# Patient Record
Sex: Female | Born: 1960 | Race: White | Hispanic: No | Smoking: Never smoker
Health system: Southern US, Community
[De-identification: ages and names within clinical notes are randomized; demographics above are authoritative.]

## PROBLEM LIST (undated history)

## (undated) DIAGNOSIS — E039 Hypothyroidism, unspecified: Secondary | ICD-10-CM

## (undated) DIAGNOSIS — Z794 Long term (current) use of insulin: Secondary | ICD-10-CM

## (undated) DIAGNOSIS — K219 Gastro-esophageal reflux disease without esophagitis: Secondary | ICD-10-CM

## (undated) DIAGNOSIS — Z9889 Other specified postprocedural states: Secondary | ICD-10-CM

## (undated) DIAGNOSIS — T8859XA Other complications of anesthesia, initial encounter: Secondary | ICD-10-CM

## (undated) DIAGNOSIS — N289 Disorder of kidney and ureter, unspecified: Secondary | ICD-10-CM

## (undated) DIAGNOSIS — M25519 Pain in unspecified shoulder: Secondary | ICD-10-CM

## (undated) DIAGNOSIS — Z87442 Personal history of urinary calculi: Secondary | ICD-10-CM

## (undated) DIAGNOSIS — G43909 Migraine, unspecified, not intractable, without status migrainosus: Secondary | ICD-10-CM

## (undated) DIAGNOSIS — E282 Polycystic ovarian syndrome: Secondary | ICD-10-CM

## (undated) DIAGNOSIS — I499 Cardiac arrhythmia, unspecified: Secondary | ICD-10-CM

## (undated) DIAGNOSIS — N898 Other specified noninflammatory disorders of vagina: Secondary | ICD-10-CM

## (undated) DIAGNOSIS — T4145XA Adverse effect of unspecified anesthetic, initial encounter: Secondary | ICD-10-CM

## (undated) DIAGNOSIS — IMO0001 Reserved for inherently not codable concepts without codable children: Secondary | ICD-10-CM

## (undated) DIAGNOSIS — R112 Nausea with vomiting, unspecified: Secondary | ICD-10-CM

## (undated) DIAGNOSIS — I10 Essential (primary) hypertension: Secondary | ICD-10-CM

## (undated) DIAGNOSIS — E119 Type 2 diabetes mellitus without complications: Secondary | ICD-10-CM

## (undated) DIAGNOSIS — E079 Disorder of thyroid, unspecified: Secondary | ICD-10-CM

## (undated) DIAGNOSIS — M1990 Unspecified osteoarthritis, unspecified site: Secondary | ICD-10-CM

## (undated) DIAGNOSIS — M7062 Trochanteric bursitis, left hip: Secondary | ICD-10-CM

## (undated) HISTORY — DX: Other specified noninflammatory disorders of vagina: N89.8

## (undated) HISTORY — DX: Reserved for inherently not codable concepts without codable children: IMO0001

## (undated) HISTORY — DX: Migraine, unspecified, not intractable, without status migrainosus: G43.909

## (undated) HISTORY — DX: Polycystic ovarian syndrome: E28.2

## (undated) HISTORY — PX: CARDIAC CATHETERIZATION: SHX172

## (undated) HISTORY — DX: Trochanteric bursitis, left hip: M70.62

## (undated) HISTORY — DX: Pain in unspecified shoulder: M25.519

## (undated) HISTORY — DX: Essential (primary) hypertension: I10

## (undated) HISTORY — DX: Long term (current) use of insulin: Z79.4

## (undated) HISTORY — DX: Disorder of thyroid, unspecified: E07.9

## (undated) HISTORY — DX: Type 2 diabetes mellitus without complications: E11.9

---
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
## 1984-01-29 DIAGNOSIS — E282 Polycystic ovarian syndrome: Secondary | ICD-10-CM

## 1984-01-29 HISTORY — DX: Polycystic ovarian syndrome: E28.2

## 1995-01-29 HISTORY — PX: OTHER SURGICAL HISTORY: SHX169

## 1997-06-02 ENCOUNTER — Other Ambulatory Visit: Admission: RE | Admit: 1997-06-02 | Discharge: 1997-06-02 | Payer: Self-pay | Admitting: Obstetrics and Gynecology

## 1997-07-13 ENCOUNTER — Other Ambulatory Visit: Admission: RE | Admit: 1997-07-13 | Discharge: 1997-07-13 | Payer: Self-pay | Admitting: Obstetrics and Gynecology

## 1998-08-30 ENCOUNTER — Inpatient Hospital Stay (HOSPITAL_COMMUNITY): Admission: AD | Admit: 1998-08-30 | Discharge: 1998-08-30 | Payer: Self-pay | Admitting: Obstetrics and Gynecology

## 1998-09-04 ENCOUNTER — Encounter: Admission: RE | Admit: 1998-09-04 | Discharge: 1998-12-03 | Payer: Self-pay | Admitting: Obstetrics and Gynecology

## 1998-11-06 ENCOUNTER — Inpatient Hospital Stay (HOSPITAL_COMMUNITY): Admission: AD | Admit: 1998-11-06 | Discharge: 1998-11-06 | Payer: Self-pay | Admitting: *Deleted

## 1999-01-29 HISTORY — PX: OTHER SURGICAL HISTORY: SHX169

## 1999-02-09 ENCOUNTER — Inpatient Hospital Stay (HOSPITAL_COMMUNITY): Admission: AD | Admit: 1999-02-09 | Discharge: 1999-02-09 | Payer: Self-pay | Admitting: Obstetrics & Gynecology

## 1999-04-10 ENCOUNTER — Encounter (INDEPENDENT_AMBULATORY_CARE_PROVIDER_SITE_OTHER): Payer: Self-pay

## 1999-04-10 ENCOUNTER — Inpatient Hospital Stay (HOSPITAL_COMMUNITY): Admission: AD | Admit: 1999-04-10 | Discharge: 1999-04-13 | Payer: Self-pay | Admitting: Obstetrics and Gynecology

## 1999-04-14 ENCOUNTER — Encounter: Admission: RE | Admit: 1999-04-14 | Discharge: 1999-07-13 | Payer: Self-pay | Admitting: Obstetrics and Gynecology

## 1999-09-04 ENCOUNTER — Other Ambulatory Visit: Admission: RE | Admit: 1999-09-04 | Discharge: 1999-09-04 | Payer: Self-pay | Admitting: Obstetrics and Gynecology

## 2000-03-11 ENCOUNTER — Other Ambulatory Visit: Admission: RE | Admit: 2000-03-11 | Discharge: 2000-03-11 | Payer: Self-pay | Admitting: Obstetrics and Gynecology

## 2000-12-02 ENCOUNTER — Other Ambulatory Visit: Admission: RE | Admit: 2000-12-02 | Discharge: 2000-12-02 | Payer: Self-pay | Admitting: Obstetrics and Gynecology

## 2001-07-17 ENCOUNTER — Encounter: Payer: Self-pay | Admitting: Orthopedic Surgery

## 2001-07-17 ENCOUNTER — Encounter: Admission: RE | Admit: 2001-07-17 | Discharge: 2001-07-17 | Payer: Self-pay | Admitting: Orthopedic Surgery

## 2002-06-25 ENCOUNTER — Ambulatory Visit (HOSPITAL_COMMUNITY): Admission: RE | Admit: 2002-06-25 | Discharge: 2002-06-25 | Payer: Self-pay | Admitting: Obstetrics and Gynecology

## 2002-06-25 ENCOUNTER — Encounter: Payer: Self-pay | Admitting: Obstetrics and Gynecology

## 2002-06-29 ENCOUNTER — Other Ambulatory Visit: Admission: RE | Admit: 2002-06-29 | Discharge: 2002-06-29 | Payer: Self-pay | Admitting: Obstetrics and Gynecology

## 2003-06-28 ENCOUNTER — Other Ambulatory Visit: Admission: RE | Admit: 2003-06-28 | Discharge: 2003-06-28 | Payer: Self-pay | Admitting: Obstetrics and Gynecology

## 2003-06-30 ENCOUNTER — Ambulatory Visit (HOSPITAL_COMMUNITY): Admission: RE | Admit: 2003-06-30 | Discharge: 2003-06-30 | Payer: Self-pay | Admitting: Obstetrics and Gynecology

## 2003-08-01 ENCOUNTER — Emergency Department (HOSPITAL_COMMUNITY): Admission: EM | Admit: 2003-08-01 | Discharge: 2003-08-01 | Payer: Self-pay | Admitting: Emergency Medicine

## 2005-04-11 ENCOUNTER — Encounter: Admission: RE | Admit: 2005-04-11 | Discharge: 2005-06-20 | Payer: Self-pay | Admitting: Orthopedic Surgery

## 2005-07-22 ENCOUNTER — Ambulatory Visit (HOSPITAL_COMMUNITY): Admission: RE | Admit: 2005-07-22 | Discharge: 2005-07-22 | Payer: Self-pay | Admitting: Obstetrics and Gynecology

## 2005-09-04 ENCOUNTER — Other Ambulatory Visit: Admission: RE | Admit: 2005-09-04 | Discharge: 2005-09-04 | Payer: Self-pay | Admitting: Obstetrics and Gynecology

## 2006-06-20 ENCOUNTER — Ambulatory Visit: Payer: Self-pay | Admitting: Cardiology

## 2009-06-13 ENCOUNTER — Emergency Department (HOSPITAL_COMMUNITY): Admission: EM | Admit: 2009-06-13 | Discharge: 2009-06-13 | Payer: Self-pay | Admitting: Emergency Medicine

## 2009-11-08 ENCOUNTER — Emergency Department (HOSPITAL_COMMUNITY): Admission: EM | Admit: 2009-11-08 | Discharge: 2009-11-08 | Payer: Self-pay | Admitting: Emergency Medicine

## 2009-11-15 ENCOUNTER — Emergency Department (HOSPITAL_COMMUNITY): Admission: EM | Admit: 2009-11-15 | Discharge: 2009-11-15 | Payer: Self-pay | Admitting: Emergency Medicine

## 2010-02-18 ENCOUNTER — Encounter: Payer: Self-pay | Admitting: Family Medicine

## 2010-04-12 LAB — COMPREHENSIVE METABOLIC PANEL
ALT: 35 U/L (ref 0–35)
Albumin: 3.5 g/dL (ref 3.5–5.2)
CO2: 26 mEq/L (ref 19–32)
Chloride: 107 mEq/L (ref 96–112)
Glucose, Bld: 94 mg/dL (ref 70–99)
Potassium: 3.6 mEq/L (ref 3.5–5.1)
Sodium: 140 mEq/L (ref 135–145)
Total Protein: 5.8 g/dL — ABNORMAL LOW (ref 6.0–8.3)

## 2010-04-12 LAB — URINALYSIS, ROUTINE W REFLEX MICROSCOPIC
Glucose, UA: NEGATIVE mg/dL
Hgb urine dipstick: NEGATIVE
Ketones, ur: NEGATIVE mg/dL
Nitrite: NEGATIVE
Protein, ur: NEGATIVE mg/dL
Urobilinogen, UA: 1 mg/dL (ref 0.0–1.0)
pH: 8.5 — ABNORMAL HIGH (ref 5.0–8.0)

## 2010-04-12 LAB — CBC
Hemoglobin: 12.5 g/dL (ref 12.0–15.0)
MCH: 30 pg (ref 26.0–34.0)
MCHC: 34 g/dL (ref 30.0–36.0)
Platelets: 219 10*3/uL (ref 150–400)

## 2010-04-16 LAB — BASIC METABOLIC PANEL
CO2: 30 mEq/L (ref 19–32)
Calcium: 8.7 mg/dL (ref 8.4–10.5)
Chloride: 109 mEq/L (ref 96–112)
Creatinine, Ser: 0.57 mg/dL (ref 0.4–1.2)
GFR calc Af Amer: >60 mL/min (ref >60)
GFR calc non Af Amer: >60 mL/min (ref >60)
Potassium: 3.9 mEq/L (ref 3.5–5.1)

## 2010-04-16 LAB — DIFFERENTIAL
Basophils Absolute: 0.1 10*3/uL (ref 0.0–0.1)
Eosinophils Absolute: 0.3 10*3/uL (ref 0.0–0.7)
Eosinophils Relative: 3 % (ref 0–5)
Lymphocytes Relative: 32 % (ref 12–46)
Monocytes Absolute: 0.4 10*3/uL (ref 0.1–1.0)
Monocytes Relative: 5 % (ref 3–12)
Neutro Abs: 5.3 10*3/uL (ref 1.7–7.7)

## 2010-04-16 LAB — CBC: RBC: 4.07 MIL/uL (ref 3.87–5.11)

## 2010-05-31 ENCOUNTER — Encounter: Payer: Self-pay | Admitting: Family Medicine

## 2011-05-22 ENCOUNTER — Encounter: Payer: Self-pay | Admitting: Cardiology

## 2011-05-22 NOTE — Progress Notes (Signed)
Error

## 2011-10-04 ENCOUNTER — Encounter (HOSPITAL_COMMUNITY): Payer: Self-pay

## 2011-10-04 ENCOUNTER — Emergency Department (HOSPITAL_COMMUNITY)
Admission: EM | Admit: 2011-10-04 | Discharge: 2011-10-04 | Disposition: A | Discharge status: Home / Self Care | Payer: BC Managed Care – PPO | Attending: Emergency Medicine | Admitting: Emergency Medicine

## 2011-10-04 ENCOUNTER — Emergency Department (HOSPITAL_COMMUNITY): Payer: BC Managed Care – PPO

## 2011-10-04 DIAGNOSIS — N201 Calculus of ureter: Secondary | ICD-10-CM | POA: Insufficient documentation

## 2011-10-04 DIAGNOSIS — E119 Type 2 diabetes mellitus without complications: Secondary | ICD-10-CM | POA: Insufficient documentation

## 2011-10-04 DIAGNOSIS — Z79899 Other long term (current) drug therapy: Secondary | ICD-10-CM | POA: Insufficient documentation

## 2011-10-04 DIAGNOSIS — Z794 Long term (current) use of insulin: Secondary | ICD-10-CM | POA: Insufficient documentation

## 2011-10-04 DIAGNOSIS — Z9089 Acquired absence of other organs: Secondary | ICD-10-CM | POA: Insufficient documentation

## 2011-10-04 LAB — URINE MICROSCOPIC-ADD ON

## 2011-10-04 LAB — URINALYSIS, ROUTINE W REFLEX MICROSCOPIC
Glucose, UA: NEGATIVE mg/dL
Nitrite: NEGATIVE
Protein, ur: 100 mg/dL — AB
Urobilinogen, UA: 1 mg/dL (ref 0.0–1.0)
pH: 5.5 (ref 5.0–8.0)

## 2011-10-04 MED ORDER — ONDANSETRON HCL 4 MG/2ML IJ SOLN
4.0000 mg | Freq: Once | INTRAMUSCULAR | Status: AC
  Administered 2011-10-04: 4 mg via INTRAVENOUS
  Filled 2011-10-04: qty 2

## 2011-10-04 MED ORDER — OXYCODONE-ACETAMINOPHEN 5-325 MG PO TABS: 1.0000 | ORAL_TABLET | ORAL | Status: AC | PRN

## 2011-10-04 MED ORDER — HYDROMORPHONE HCL PF 1 MG/ML IJ SOLN
1.0000 mg | INTRAMUSCULAR | Status: AC
  Administered 2011-10-04: 1 mg via INTRAVENOUS
  Filled 2011-10-04: qty 1

## 2011-10-04 MED ORDER — KETOROLAC TROMETHAMINE 30 MG/ML IJ SOLN
30.0000 mg | Freq: Once | INTRAMUSCULAR | Status: AC
  Administered 2011-10-04: 30 mg via INTRAVENOUS
  Filled 2011-10-04: qty 1

## 2011-10-04 MED ORDER — MORPHINE SULFATE 4 MG/ML IJ SOLN
6.0000 mg | Freq: Once | INTRAMUSCULAR | Status: AC
  Administered 2011-10-04: 6 mg via INTRAVENOUS
  Filled 2011-10-04: qty 2

## 2011-10-04 MED ORDER — PROMETHAZINE HCL 25 MG PO TABS: 25.0000 mg | ORAL_TABLET | Freq: Three times a day (TID) | ORAL | Status: DC | PRN

## 2011-10-04 NOTE — ED Notes (Signed)
Pt reports (R) flank pain, lower back pain starting 0430 this am, pain w/urination, and "red" colored urine x4 days. Pt reports hx of kidney stones, pt seen ather pcp yesterday

## 2011-10-04 NOTE — ED Notes (Signed)
To ct

## 2011-10-04 NOTE — ED Provider Notes (Signed)
History     CSN: [id number]  Arrival date & time 10/04/11  7829   First MD Initiated Contact with Patient 10/04/11 [phone]      Chief Complaint  Patient presents with  . Flank Pain    (Consider location/radiation/quality/duration/timing/severity/associated sxs/prior treatment) HPI Comments: Patient with hx kidney stones presents with right flank pain, hematuria, N/V, increased urinary output. Pain started at 4:30 this morning, pain described as a constant 10/10 stabbing pain in her lower right back. She states she can feel the "stone" moving. Patient has had cloudy, bloody urine x 4 days and and achy lower abdominal pain x 2 days, went to her PCP yesterday and was prescribed Vicodin and Cipro. Patient took a Vicodin this morning for the pain but vomited shortly after and states it did not help.  Patient has been nauseated since the pain started and reports vomiting twice, no blood in emesis. Patient reports more volume when she urinates and "possibly" more frequency of urination. Has had an increase in BMs for the past 3-4 days, having 3-4 BMs daily (normal is once daily), which she reports being similar to the last time she had a kidney stone. Patient reports h/o kidney stones, the last one 7 years ago and states this pain is the same. She denies fevers, dysuria, diarrhea, constipation, hematochezia.   Patient is a 51 y.o. female presenting with flank pain. The history is provided by the patient.  Flank Pain Associated symptoms include abdominal pain, nausea and vomiting. Pertinent negatives include no chills or fever.    Past Medical History  Diagnosis Date  . IDDM (insulin dependent diabetes mellitus)     AODM   . AOD abuse   . Migraines   . Trochanteric bursitis of left hip   . Shoulder pain     Past Surgical History  Procedure Date  . Appendectomy 1967  . Nsvd 2001  . Sonobysterogram, laporoscopy 1997    Family History  Problem Relation Age of Onset  . Hypertension Mother     . Hypertension Father   . Hypertension Sister   . Hypertension Maternal Grandmother   . Heart attack Maternal Grandfather   . Diabetes Maternal Grandfather   . Diabetes Paternal Grandfather   . Diabetes Mother     History  Substance Use Topics  . Smoking status: Never Smoker   . Smokeless tobacco: Not on file  . Alcohol Use: No    OB History    Grav Para Term Preterm Abortions TAB SAB Ect Mult Living                  Review of Systems  Constitutional: Negative for fever and chills.  Gastrointestinal: Positive for nausea, vomiting and abdominal pain. Negative for diarrhea, constipation and blood in stool.  Genitourinary: Positive for flank pain. Negative for dysuria, urgency, frequency and decreased urine volume.    Allergies  Simvastatin  Home Medications   Current Outpatient Rx  Name Route Sig Dispense Refill  . TYLENOL ARTHRITIS PAIN PO Oral Take by mouth as needed.      . ASPIRIN 81 MG PO CHEW Oral Chew 81 mg by mouth daily.      [name] ESCITALOPRAM OXALATE 20 MG PO TABS Oral Take 20 mg by mouth daily.      [name] NOVOLOG FLEXPEN [place] Subcutaneous Inject into the skin daily before lunch. 6-8 Units     . INSULIN DETEMIR 100 UNIT/ML [place] SOLN Subcutaneous Inject into the skin at bedtime. Sliding scale  40-45 Units daily       . METFORMIN HCL 500 MG PO TABS Oral Take 500 mg by mouth as directed. Take one tablet by mouth at 2 AM and again at 3 PM.     . MINOCYCLINE HCL 100 MG PO TABS Oral Take 100 mg by mouth daily.      [name] MOEXIPRIL-HYDROCHLOROTHIAZIDE 7.5-12.5 MG PO TABS Oral Take 1 tablet by mouth daily.      [name] PHENTERMINE HCL 37.5 MG PO CAPS Oral Take 37.5 mg by mouth every morning.      [name] ROSUVASTATIN CALCIUM 5 MG PO TABS Oral Take 5 mg by mouth daily.        BP 176/71  Pulse 82  Temp 97.4 F (36.3 C) (Oral)  Resp 18  SpO2 100%  Physical Exam  Nursing note and vitals reviewed. Constitutional: She appears well-developed and well-nourished. No distress.  HENT:  Head:  Normocephalic and atraumatic.  Neck: Neck supple.  Cardiovascular: Normal rate and regular rhythm.   Pulmonary/Chest: Effort normal and breath sounds normal. No respiratory distress. She has no wheezes. She has no rales.  Abdominal: Soft. She exhibits no distension. There is tenderness. There is CVA tenderness. There is no rebound and no guarding.         Right CVA tenderness  Neurological: She is alert.  Skin: She is not diaphoretic.    ED Course  Procedures (including critical care time)  Labs Reviewed  URINALYSIS, ROUTINE W REFLEX MICROSCOPIC - Abnormal; Notable for the following:    Color, Urine RED (*)  BIOCHEMICALS MAY BE AFFECTED BY COLOR   APPearance TURBID (*)     Specific Gravity, Urine 1.031 (*)     Hgb urine dipstick LARGE (*)     Bilirubin Urine MODERATE (*)     Ketones, ur 15 (*)     Protein, ur 100 (*)     Leukocytes, UA SMALL (*)     All other components within normal limits  URINE MICROSCOPIC-ADD ON - Abnormal; Notable for the following:    Bacteria, UA FEW (*)     All other components within normal limits  PREGNANCY, URINE  URINE CULTURE   Ct Abdomen Pelvis Wo Contrast  10/04/2011  *RADIOLOGY REPORT*  Clinical Data: Right flank pain.  Pain with urination.  CT ABDOMEN AND PELVIS WITHOUT CONTRAST  Technique:  Multidetector CT imaging of the abdomen and pelvis was performed following the standard protocol without intravenous contrast.  Comparison: 11/08/2009.  Findings: Lung bases show no acute findings.  Heart size normal. No pericardial or pleural effusion.  Liver, gallbladder and adrenal glands unremarkable.  Mild right hydronephrosis and peri nephric stranding secondary to a 2 mm stone at the right ureteral pelvic junction (image 42).  Remainder of the right ureter is decompressed.  Left kidney, spleen, pancreas, and stomach are unremarkable.  The wall of the horizontal portion of the duodenum appears thickened (image 39), but this area is under distended. Distal  small bowel feces sign is present but is nonspecific, and may relate to the presence of a fair amount of stool in the colon.  Colon is otherwise unremarkable.  Uterus and ovaries are visualized.  Scattered atherosclerotic calcification of the arterial vasculature without abdominal aortic aneurysm.  No pathologically enlarged lymph nodes.  No free fluid.  Degenerative changes are seen at the symphysis pubis.  A sclerotic lesion in the L3 vertebral body is unchanged.  IMPRESSION:  1.  Mildly obstructing 2 mm stone at the  right ureteral pelvic junction. 2.  Thickened appearance of the duodenal wall may be due to under distention.  Duodenitis cannot be excluded. 3.  Constipation.   Original Report Authenticated By: Reyes Ivan, M.D.    10:20 AM Patient reports great improvement.  Is sitting up, comfortable appearing, tolerating PO.    1. Right ureteral stone       MDM  Pt with hx kidney stones presenting with right flank pain, hematuria, N/V, c/w her prior kidney stones.  No apparent UTI on UA, though patient is currently on cipro from PCP for presumed UTI/kidney stone.  Found to have 2mm right ureteral stone on CT.  CT also notes possibility of duodenitis though clinically I doubt this.  Pt d/c home with percocet, phenergan, urology follow up.  Discussed all results and care plan with patient.  Pt given return precautions.  Pt verbalizes understanding and agrees with plan.           [place], [place] 10/04/11 1026

## 2011-10-04 NOTE — ED Notes (Signed)
Family at bedside. Resting quielty

## 2011-10-04 NOTE — ED Notes (Signed)
Patient transported to CT 

## 2011-10-04 NOTE — ED Notes (Signed)
States feels better given gingerale

## 2011-10-05 LAB — URINE CULTURE
Colony Count: NO GROWTH
Culture: NO GROWTH

## 2011-10-07 LAB — POCT I-STAT, CHEM 8
BUN: 20 mg/dL (ref 6–23)
Hemoglobin: 13.6 g/dL (ref 12.0–15.0)
Potassium: 4.5 mEq/L (ref 3.5–5.1)
Sodium: 138 mEq/L (ref 135–145)
TCO2: 22 mmol/L (ref 0–100)

## 2011-10-10 NOTE — ED Provider Notes (Signed)
Medical screening examination/treatment/procedure(s) were performed by non-physician practitioner and as supervising physician I was immediately available for consultation/collaboration.  Olivia Mackie, MD 10/10/11 [phone]

## 2012-04-28 ENCOUNTER — Other Ambulatory Visit: Payer: Self-pay | Admitting: Family Medicine

## 2012-05-25 ENCOUNTER — Other Ambulatory Visit: Payer: Self-pay | Admitting: Family Medicine

## 2012-06-02 ENCOUNTER — Other Ambulatory Visit: Payer: Self-pay

## 2012-06-02 MED ORDER — METFORMIN HCL 500 MG PO TABS: ORAL_TABLET | ORAL | Status: DC

## 2012-06-02 NOTE — Telephone Encounter (Signed)
Last glucose 7/13

## 2012-06-03 ENCOUNTER — Other Ambulatory Visit: Payer: Self-pay | Admitting: Family Medicine

## 2012-06-12 ENCOUNTER — Other Ambulatory Visit: Payer: Self-pay | Admitting: Family Medicine

## 2012-06-15 ENCOUNTER — Ambulatory Visit: Payer: Self-pay | Admitting: Physician Assistant

## 2012-06-17 ENCOUNTER — Other Ambulatory Visit: Payer: Self-pay | Admitting: Family Medicine

## 2012-06-18 NOTE — Telephone Encounter (Signed)
Patient has not been seen since 10/08/11

## 2012-06-23 NOTE — Telephone Encounter (Signed)
RXs called into [facility] Pt notified

## 2012-06-26 ENCOUNTER — Ambulatory Visit
Admission: RE | Admit: 2012-06-26 | Discharge: 2012-06-26 | Disposition: A | Discharge status: Home / Self Care | Payer: BC Managed Care – PPO | Source: Ambulatory Visit | Attending: Physician Assistant | Admitting: Physician Assistant

## 2012-06-26 ENCOUNTER — Telehealth: Payer: Self-pay | Admitting: Family Medicine

## 2012-06-26 ENCOUNTER — Encounter: Payer: Self-pay | Admitting: Physician Assistant

## 2012-06-26 ENCOUNTER — Ambulatory Visit (INDEPENDENT_AMBULATORY_CARE_PROVIDER_SITE_OTHER): Payer: BC Managed Care – PPO | Admitting: Physician Assistant

## 2012-06-26 VITALS — BP 133/85 | HR 101 | Temp 99.3°F | Ht 64.0 in | Wt 179.4 lb

## 2012-06-26 DIAGNOSIS — IMO0001 Reserved for inherently not codable concepts without codable children: Secondary | ICD-10-CM | POA: Insufficient documentation

## 2012-06-26 DIAGNOSIS — E119 Type 2 diabetes mellitus without complications: Secondary | ICD-10-CM | POA: Insufficient documentation

## 2012-06-26 DIAGNOSIS — I10 Essential (primary) hypertension: Secondary | ICD-10-CM | POA: Insufficient documentation

## 2012-06-26 DIAGNOSIS — R109 Unspecified abdominal pain: Secondary | ICD-10-CM

## 2012-06-26 DIAGNOSIS — Z794 Long term (current) use of insulin: Secondary | ICD-10-CM

## 2012-06-26 LAB — POCT CBC
Lymph, poc: 2.1 (ref 0.6–3.4)
MCH, POC: 31.7 pg — AB (ref 27–31.2)
MCHC: 35.3 g/dL (ref 31.8–35.4)
MCV: 89.8 fL (ref 80–97)
MPV: 8.1 fL (ref 0–99.8)
POC LYMPH PERCENT: 31.4 %L (ref 10–50)
Platelet Count, POC: 193 10*3/uL (ref 142–424)
RDW, POC: 12.4 %
WBC: 6.7 10*3/uL (ref 4.6–10.2)

## 2012-06-26 NOTE — Telephone Encounter (Signed)
APPT MADE

## 2012-06-26 NOTE — Progress Notes (Signed)
Subjective:     Patient ID: Kayla Berger, female   DOB: [birth year], 52 y.o.   MRN: [id number]  HPI Pt with intermit RUQ abd pain over the last several weeks Pain will radiate through to the back on occasion She has had nausea but no vomiting and diarrhea She has tried to change diet but sx cont Sx worse last pm after eating fried food Mult family members of family with gallbladder dz   Review of Systems  All other systems reviewed and are negative.       Objective:   Physical Exam  Nursing note and vitals reviewed. Constitutional: She appears well-developed and well-nourished.  Cardiovascular: Normal rate, regular rhythm and normal heart sounds.   Pulmonary/Chest: Effort normal and breath sounds normal.  Abdominal: Soft. Bowel sounds are normal. She exhibits no distension and no mass. There is tenderness. There is no rebound and no guarding.  Tenderness to palp is in the RUQ only CBC- nl WBC, see labs     Assessment:     RUQ abd pain    Plan:     Hold po intake for now Pt referred for GBUS now in [place] Will call with results

## 2012-06-26 NOTE — Patient Instructions (Signed)
Cholelithiasis Cholelithiasis (also called gallstones) is a form of gallbladder disease where gallstones form in your gallbladder. The gallbladder is a non-essential organ that stores bile made in the liver, which helps digest fats. Gallstones begin as small crystals and slowly grow into stones. Gallstone pain occurs when the gallbladder spasms, and a gallstone is blocking the duct. Pain can also occur when a stone passes out of the duct.  Women are more likely to develop gallstones than men. Other factors that increase the risk of gallbladder disease are:  Having multiple pregnancies. Physicians sometimes advise removing diseased gallbladders before future pregnancies.  Obesity.  Diets heavy in fried foods and fat.  Increasing age (older than 60).  Prolonged use of medications containing female hormones.  Diabetes mellitus.  Rapid weight loss.  Family history of gallstones (heredity). SYMPTOMS  Feeling sick to your stomach (nauseous).  Abdominal pain.  Yellowing of the skin (jaundice).  Sudden pain. It may persist from several minutes to several hours.  Worsening pain with deep breathing or when jarred.  Fever.  Tenderness to the touch. In some cases, when gallstones do not move into the bile duct, people have no pain or symptoms. These are called "silent" gallstones. TREATMENT In severe cases, emergency surgery may be required. HOME CARE INSTRUCTIONS   Only take over-the-counter or prescription medicines for pain, discomfort, or fever as directed by your caregiver.  Follow a low-fat diet until seen again. Fat causes the gallbladder to contract, which can result in pain.  Follow up as instructed. Attacks are almost always recurrent and surgery is usually required for permanent treatment. SEEK IMMEDIATE MEDICAL CARE IF:   Your pain increases and is not controlled by medications.  You have an oral temperature above 102 F (38.9 C), not controlled by medication.  You  develop nausea and vomiting. MAKE SURE YOU:   Understand these instructions.  Will watch your condition.  Will get help right away if you are not doing well or get worse. Document Released: 01/10/2005 Document Revised: 04/08/2011 Document Reviewed: 03/15/2010 Exit[employer] Patient Information 2014 [employer].  

## 2012-06-29 ENCOUNTER — Telehealth: Payer: Self-pay | Admitting: Physician Assistant

## 2012-06-29 DIAGNOSIS — R1011 Right upper quadrant pain: Secondary | ICD-10-CM

## 2012-06-29 NOTE — Telephone Encounter (Signed)
Informed of results  Needs to be set up for HIDA scan

## 2012-06-29 NOTE — Telephone Encounter (Signed)
Montey Hora, PA-C spoke with patient directly and explained results and recommendations.  HIDA scan will be ordered.

## 2012-06-29 NOTE — Telephone Encounter (Signed)
Please advise 

## 2012-07-01 NOTE — Addendum Note (Signed)
Addended by: Gwenith Daily on: 07/01/2012 09:11 AM   Modules accepted: Orders

## 2012-07-02 ENCOUNTER — Ambulatory Visit (INDEPENDENT_AMBULATORY_CARE_PROVIDER_SITE_OTHER): Payer: BC Managed Care – PPO | Admitting: Family Medicine

## 2012-07-02 ENCOUNTER — Encounter (HOSPITAL_COMMUNITY)
Admission: RE | Admit: 2012-07-02 | Discharge: 2012-07-02 | Disposition: A | Discharge status: Home / Self Care | Payer: BC Managed Care – PPO | Source: Ambulatory Visit | Attending: Physician Assistant | Admitting: Physician Assistant

## 2012-07-02 ENCOUNTER — Encounter: Payer: Self-pay | Admitting: Family Medicine

## 2012-07-02 VITALS — BP 141/93 | HR 96 | Temp 98.1°F | Ht 64.0 in | Wt 185.8 lb

## 2012-07-02 DIAGNOSIS — R5383 Other fatigue: Secondary | ICD-10-CM

## 2012-07-02 DIAGNOSIS — R1011 Right upper quadrant pain: Secondary | ICD-10-CM

## 2012-07-02 DIAGNOSIS — I10 Essential (primary) hypertension: Secondary | ICD-10-CM

## 2012-07-02 DIAGNOSIS — E559 Vitamin D deficiency, unspecified: Secondary | ICD-10-CM

## 2012-07-02 DIAGNOSIS — E785 Hyperlipidemia, unspecified: Secondary | ICD-10-CM

## 2012-07-02 DIAGNOSIS — E039 Hypothyroidism, unspecified: Secondary | ICD-10-CM

## 2012-07-02 DIAGNOSIS — R5381 Other malaise: Secondary | ICD-10-CM

## 2012-07-02 DIAGNOSIS — E119 Type 2 diabetes mellitus without complications: Secondary | ICD-10-CM

## 2012-07-02 LAB — BASIC METABOLIC PANEL WITH GFR
Calcium: 9 mg/dL (ref 8.4–10.5)
GFR, Est African American: >89 mL/min
GFR, Est Non African American: >89 mL/min
Glucose, Bld: 263 mg/dL — ABNORMAL HIGH (ref 70–99)
Sodium: 138 mEq/L (ref 135–145)

## 2012-07-02 LAB — POCT CBC
HCT, POC: 41.1 % (ref 37.7–47.9)
Hemoglobin: 14 g/dL (ref 12.2–16.2)
MCH, POC: 30.6 pg (ref 27–31.2)
MCV: 89.8 fL (ref 80–97)
MPV: 8.1 fL (ref 0–99.8)
RBC: 4.6 M/uL (ref 4.04–5.48)

## 2012-07-02 LAB — LIPID PANEL
Cholesterol: 128 mg/dL (ref 0–200)
HDL: 38 mg/dL — ABNORMAL LOW (ref >39)
Triglycerides: 328 mg/dL — ABNORMAL HIGH (ref <150)
VLDL: 66 mg/dL — ABNORMAL HIGH (ref 0–40)

## 2012-07-02 LAB — THYROID PANEL WITH TSH
Free Thyroxine Index: 2 (ref 1.0–3.9)
T3 Uptake: 37.9 % — ABNORMAL HIGH (ref 22.5–37.0)
TSH: 3.117 u[IU]/mL (ref 0.350–4.500)

## 2012-07-02 LAB — HEPATIC FUNCTION PANEL
ALT: 31 U/L (ref 0–35)
Bilirubin, Direct: 0.1 mg/dL (ref 0.0–0.3)

## 2012-07-02 MED ORDER — MOEXIPRIL-HYDROCHLOROTHIAZIDE 15-12.5 MG PO TABS: 1.0000 | ORAL_TABLET | Freq: Every day | ORAL | Status: DC

## 2012-07-02 MED ORDER — LEVOTHYROXINE SODIUM 50 MCG PO TABS: 50.0000 ug | ORAL_TABLET | Freq: Every day | ORAL | Status: DC

## 2012-07-02 MED ORDER — TECHNETIUM TC 99M MEBROFENIN IV KIT: 5.0000 | PACK | Freq: Once | INTRAVENOUS | Status: AC | PRN

## 2012-07-02 MED ORDER — FLUTICASONE PROPIONATE 50 MCG/ACT NA SUSP: 2.0000 | Freq: Every day | NASAL | Status: DC

## 2012-07-02 MED ORDER — METFORMIN HCL 500 MG PO TABS: ORAL_TABLET | ORAL | Status: DC

## 2012-07-02 MED ORDER — SINCALIDE 5 MCG IJ SOLR
INTRAMUSCULAR | Status: AC
  Administered 2012-07-02: 1.63 ug via INTRAVENOUS
  Filled 2012-07-02: qty 5

## 2012-07-02 MED ORDER — INSULIN DETEMIR 100 UNIT/ML FLEXPEN: 42.0000 [IU] | PEN_INJECTOR | Freq: Every day | SUBCUTANEOUS | Status: DC

## 2012-07-02 MED ORDER — SINCALIDE 5 MCG IJ SOLR
0.0200 ug/kg | Freq: Once | INTRAMUSCULAR | Status: AC
  Administered 2012-07-02: 1.63 ug via INTRAVENOUS

## 2012-07-02 NOTE — Patient Instructions (Addendum)
Try to do better with keeping appointments with our office. Monitor blood sugar and blood pressures at home closely. Bring blood pressure readings by in 2 weeks for review Many of the complaints that you have today are probably somewhat related to having not taken your medicine on a regular basis. We will try to find a substitute for levemir which your insurance will approve and which will work in controlling your blood sugar as well Otherwise continue current medications and aggressive therapeutic lifestyle change

## 2012-07-02 NOTE — Progress Notes (Signed)
Subjective:    Patient ID: Kayla Berger, female    DOB: 01-20-1961, 52 y.o.   MRN: [id number]  HPI This patient has not been seen for an office visit since September of 2013. She has not had lab work since July of 2013. She has lapsed when taking her blood pressure medicine, her insulin, and her thyroid medicine. As noted below she is having problems with allergies edema and nausea and headaches secondary to sinus.: Her health maintenance she is due for a fecal occult blood test and mammogram. Patient had a HIDA scan this morning at [facility] in followup to a negative abdominal ultrasound due to increasing right upper quadrant pain, nausea and reflux. She is taking Benadryl for allergy and this seems to help at least at nighttime.   Review of Systems  Constitutional: Positive for fatigue (increased).  HENT: Positive for congestion (slight), postnasal drip and sinus pressure. Negative for ear pain.   Eyes: Positive for redness and itching (due to allergies).  Respiratory: Positive for cough (slight due to allergies).   Cardiovascular: Positive for leg swelling (daily). Negative for chest pain.  Gastrointestinal: Positive for nausea, abdominal pain (RMQ radiating to back) and diarrhea.  Endocrine: Negative.   Genitourinary: Positive for frequency. Negative for dysuria.  Musculoskeletal: Positive for back pain (LBP) and arthralgias (bilateral hips).  Skin: Negative.   Allergic/Immunologic: Positive for environmental allergies (seasonal).  Neurological: Positive for headaches (sinus).  Psychiatric/Behavioral: Positive for sleep disturbance (nightly). The patient is nervous/anxious.        Objective:   Physical Exam BP 141/93  Pulse 96  Temp(Src) 98.1 F (36.7 C) (Oral)  Ht 5\' 4"  (1.626 m)  Wt 185 lb 12.8 oz (84.278 kg)  BMI 31.88 kg/m2  The patient appeared well nourished and normally developed, alert and oriented to time and place. Speech, behavior and judgement appear  normal. Vital signs as documented.  Head exam is unremarkable. No scleral icterus or pallor noted. There is bilateral nasal congestion. TMs mouth and throat are normal  Neck is without jugular venous distension, thyromegally, or carotid bruits. Carotid upstrokes are brisk bilaterally. No cervical adenopathy. Lungs are clear anteriorly and posteriorly to auscultation. Normal respiratory effort. Cardiac exam reveals regular rate and rhythm at 72 per minute. First and second heart sounds normal.  No murmurs, rubs or gallops.  Abdominal exam reveals normal bowl sounds, no masses, no organomegaly and no aortic enlargement. No inguinal adenopathy. There was tenderness in the right upper quadrant to palpation. Extremities are nonedematous and both pedal pulses are normal. Skin without pallor or jaundice.  Warm and dry, without rash. Neurologic exam reveals normal deep tendon reflexes and normal sensation. Diabetic foot exam was done today.          Assessment & Plan:  1. Fatigue - POCT CBC  2. Diabetes - POCT glycosylated hemoglobin (Hb A1C) - BASIC METABOLIC PANEL WITH GFR - POCT UA - Microalbumin  3. Hypothyroid - Thyroid Panel With TSH  4. Vitamin D deficiency - Vitamin D 25 hydroxy  5. Hyperlipidemia - Hepatic function panel - Lipid panel  6. Hypertension - BASIC METABOLIC PANEL WITH GFR  Patient Instructions  Try to do better with keeping appointments with our office. Monitor blood sugar and blood pressures at home closely. Bring blood pressure readings by in 2 weeks for review Many of the complaints that you have today are probably somewhat related to having not taken your medicine on a regular basis. We will try to find  a substitute for levemir which your insurance will approve and which will work in controlling your blood sugar as well Otherwise continue current medications and aggressive therapeutic lifestyle change

## 2012-07-03 ENCOUNTER — Ambulatory Visit (INDEPENDENT_AMBULATORY_CARE_PROVIDER_SITE_OTHER): Payer: BC Managed Care – PPO | Admitting: General Surgery

## 2012-07-03 ENCOUNTER — Encounter (INDEPENDENT_AMBULATORY_CARE_PROVIDER_SITE_OTHER): Payer: Self-pay | Admitting: General Surgery

## 2012-07-03 VITALS — BP 142/88 | HR 80 | Temp 97.0°F | Resp 17 | Ht 64.0 in | Wt 191.2 lb

## 2012-07-03 DIAGNOSIS — K828 Other specified diseases of gallbladder: Secondary | ICD-10-CM

## 2012-07-03 LAB — VITAMIN D 25 HYDROXY (VIT D DEFICIENCY, FRACTURES): Vit D, 25-Hydroxy: 56 ng/mL (ref 30–89)

## 2012-07-03 NOTE — Progress Notes (Signed)
Patient ID: Kayla Berger, female   DOB: Oct 08, 1960, 52 y.o.   MRN: [id number]  Chief Complaint  Patient presents with  . New Evaluation    eval galbladder    HPI Kayla Berger is a 52 y.o. female.  The patient is a 52 year old female who was referred by Dr. Christell Constant for evaluation of biliary dyskinesia. The patient states she's had several month history of right upper quadrant pain after high fatty foods and meals. Patient also states she has some burning and reflux as well associated with these symptoms. The patient's abdominal ultrasound and a HIDA scan which revealed no gallstones and ejection fraction of 18%.  HPI  Past Medical History  Diagnosis Date  . IDDM (insulin dependent diabetes mellitus)     AODM   . Migraines   . Trochanteric bursitis of left hip   . Shoulder pain   . Hypertension   . Thyroid disease     Past Surgical History  Procedure Laterality Date  . Appendectomy  1967  . Nsvd  2001  . Sonobysterogram, laporoscopy  1997    Family History  Problem Relation Age of Onset  . Hypertension Mother   . Diabetes Mother   . Hypertension Father   . Cancer Father   . Hypertension Sister   . Hypertension Maternal Grandmother   . Heart attack Maternal Grandfather   . Diabetes Maternal Grandfather   . Diabetes Paternal Grandfather     Social History History  Substance Use Topics  . Smoking status: Never Smoker   . Smokeless tobacco: Not on file  . Alcohol Use: No    No Known Allergies  Current Outpatient Prescriptions  Medication Sig Dispense Refill  . aspirin (BABY ASPIRIN) 81 MG chewable tablet Chew 81 mg by mouth daily.        . CRESTOR 5 MG tablet TAKE ONE TABLET BY MOUTH ONE TIME DAILY  30 tablet  0  . escitalopram (LEXAPRO) 20 MG tablet Take 10 mg by mouth daily.       . Exenatide (BYDUREON) 2 MG SUSR Inject 2 mg into the skin once a week. On Sunday Evenings      . fluticasone (FLONASE) 50 MCG/ACT nasal spray Place 2 sprays into the nose daily.  16 g   5  . Insulin Aspart (NOVOLOG FLEXPEN [place]) Inject 6 Units into the skin daily before lunch.       . Insulin Detemir (LEVEMIR FLEXPEN) 100 UNIT/ML SOPN Inject 42 Units into the skin daily.  15 mL  4  . levothyroxine (SYNTHROID) 50 MCG tablet Take 1 tablet (50 mcg total) by mouth daily before breakfast.  30 tablet  5  . metFORMIN (GLUCOPHAGE) 500 MG tablet 2 tablets Am 3 tablets PM  150 tablet  0  . minocycline (MINOCIN,DYNACIN) 100 MG capsule TAKE ONE CAPSULE BY MOUTH TWICE DAILY  60 capsule  0  . Moexipril-Hydrochlorothiazide 15-12.5 MG TABS Take 1 tablet by mouth daily.  30 each  5   No current facility-administered medications for this visit.    Review of Systems Review of Systems  Eyes: Negative.   Respiratory: Negative.   Cardiovascular: Negative.   Gastrointestinal: Negative.   Neurological: Negative.   All other systems reviewed and are negative.    Blood pressure 142/88, pulse 80, temperature 97 F (36.1 C), temperature source Temporal, resp. rate 17, height 5\' 4"  (1.626 m), weight 191 lb 3.2 oz (86.728 kg), SpO2 99.00%.  Physical Exam Physical Exam  Vitals reviewed.  Constitutional: She is oriented to person, place, and time. She appears well-developed and well-nourished.  HENT:  Head: Normocephalic and atraumatic.  Eyes: Conjunctivae and EOM are normal. Pupils are equal, round, and reactive to light.  Neck: Normal range of motion.  Cardiovascular: Normal rate, regular rhythm and normal heart sounds.   Pulmonary/Chest: Effort normal and breath sounds normal.  Abdominal: Soft. Bowel sounds are normal. There is no tenderness. There is no rebound and no guarding.  Musculoskeletal: Normal range of motion.  Neurological: She is alert and oriented to person, place, and time.  Skin: Skin is warm and dry.    Data Reviewed HIDA scan reveals ejection fraction of 18%; ultrasound revealed no gallstones  Assessment    52 year old female with biliary dyskinesia     Plan     1. We'll proceed to the operating room for laparoscopic cholecystectomy no intraoperative cholangiogram 2.All risks and benefits were discussed with the patient to generally include: infection, bleeding, possible need for post op ERCP, damage to the bile ducts, and bile leak. Alternatives were offered and described.  All questions were answered and the patient voiced understanding of the procedure and wishes to proceed at this point with a laparoscopic cholecystectomy         Marigene Ehlers., Shamir Tuzzolino 07/03/2012, 3:05 PM

## 2012-07-08 ENCOUNTER — Telehealth: Payer: Self-pay | Admitting: *Deleted

## 2012-07-08 NOTE — Telephone Encounter (Signed)
na

## 2012-07-20 ENCOUNTER — Encounter: Payer: Self-pay | Admitting: *Deleted

## 2012-07-22 ENCOUNTER — Other Ambulatory Visit: Payer: Self-pay | Admitting: Family Medicine

## 2012-07-23 NOTE — Telephone Encounter (Signed)
Last filled 06/17/12

## 2012-07-30 ENCOUNTER — Other Ambulatory Visit: Payer: Self-pay | Admitting: Family Medicine

## 2012-08-05 ENCOUNTER — Other Ambulatory Visit: Payer: Self-pay | Admitting: Family Medicine

## 2012-09-10 ENCOUNTER — Other Ambulatory Visit: Payer: Self-pay | Admitting: Family Medicine

## 2012-09-23 ENCOUNTER — Ambulatory Visit (INDEPENDENT_AMBULATORY_CARE_PROVIDER_SITE_OTHER): Payer: BC Managed Care – PPO | Admitting: Family Medicine

## 2012-09-23 ENCOUNTER — Encounter: Payer: Self-pay | Admitting: Family Medicine

## 2012-09-23 VITALS — BP 115/72 | HR 82 | Temp 98.6°F | Ht 64.0 in | Wt 186.8 lb

## 2012-09-23 DIAGNOSIS — Z794 Long term (current) use of insulin: Secondary | ICD-10-CM

## 2012-09-23 DIAGNOSIS — Z139 Encounter for screening, unspecified: Secondary | ICD-10-CM

## 2012-09-23 DIAGNOSIS — N649 Disorder of breast, unspecified: Secondary | ICD-10-CM

## 2012-09-23 DIAGNOSIS — Z8669 Personal history of other diseases of the nervous system and sense organs: Secondary | ICD-10-CM | POA: Insufficient documentation

## 2012-09-23 DIAGNOSIS — E039 Hypothyroidism, unspecified: Secondary | ICD-10-CM | POA: Insufficient documentation

## 2012-09-23 DIAGNOSIS — E119 Type 2 diabetes mellitus without complications: Secondary | ICD-10-CM

## 2012-09-23 DIAGNOSIS — E785 Hyperlipidemia, unspecified: Secondary | ICD-10-CM | POA: Insufficient documentation

## 2012-09-23 DIAGNOSIS — IMO0001 Reserved for inherently not codable concepts without codable children: Secondary | ICD-10-CM

## 2012-09-23 DIAGNOSIS — J309 Allergic rhinitis, unspecified: Secondary | ICD-10-CM

## 2012-09-23 DIAGNOSIS — E559 Vitamin D deficiency, unspecified: Secondary | ICD-10-CM

## 2012-09-23 DIAGNOSIS — I10 Essential (primary) hypertension: Secondary | ICD-10-CM

## 2012-09-23 NOTE — Addendum Note (Signed)
Addended by: Bearl Mulberry on: 09/23/2012 04:27 PM   Modules accepted: Orders

## 2012-09-23 NOTE — Progress Notes (Signed)
Subjective:    Patient ID: Kayla Berger, female    DOB: 04/05/1960, 52 y.o.   MRN: [id number]  HPI Patient returns to clinic today for followup and management of chronic medical problems. She  has a history of insulin dependent diabetes, hyperlipidemia, hypertension, and vitamin D deficiency. She also has gallstones and a history of migraine headaches. She also deals with allergic rhinitis and chronic back pain. See home blood sugars and blood pressures.  She is also due for her mammogram and has an exam planned in 3 weeks   Review of Systems  Constitutional: Positive for fatigue. Negative for activity change and appetite change.  HENT: Positive for rhinorrhea, postnasal drip (due to allergies) and sinus pressure. Negative for ear pain.   Eyes: Positive for itching (due to allergies).  Respiratory: Negative.  Negative for cough, choking, chest tightness, shortness of breath and wheezing.   Cardiovascular: Positive for leg swelling (intermitent). Negative for chest pain and palpitations.  Gastrointestinal: Positive for abdominal pain (RMQ intermitent) and constipation. Negative for nausea, vomiting, diarrhea, blood in stool and abdominal distention.  Endocrine: Negative for cold intolerance, heat intolerance, polydipsia and polyphagia.  Genitourinary: Negative.  Negative for dysuria, urgency, frequency, hematuria, vaginal bleeding and vaginal discharge.  Musculoskeletal: Positive for back pain (cervical, LBP) and arthralgias (bilateral shoulders). Negative for myalgias.  Skin: Negative.  Negative for color change, pallor, rash and wound.  Allergic/Immunologic: Positive for environmental allergies.  Neurological: Positive for headaches (migraines 3-4 x mth, weekly headaches). Negative for tremors, weakness, light-headedness and numbness.  Psychiatric/Behavioral: Positive for sleep disturbance (nightly). Negative for confusion and agitation. The patient is nervous/anxious.        Objective:    Physical Exam BP 115/72  Pulse 82  Temp(Src) 98.6 F (37 C) (Oral)  Ht 5\' 4"  (1.626 m)  Wt 186 lb 12.8 oz (84.732 kg)  BMI 32.05 kg/m2  The patient appeared well nourished and normally developed, alert and oriented to time and place. Speech, behavior and judgement appear normal. Vital signs as documented.  Head exam is unremarkable. No scleral icterus or pallor noted. Nasal pallor. Mouth and throat were normal. Ear canals and TMs were normal Neck is without jugular venous distension, thyromegally, or carotid bruits. Carotid upstrokes are brisk bilaterally. No cervical adenopathy. Lungs are clear anteriorly and posteriorly to auscultation. Normal respiratory effort. Cardiac exam reveals regular rate and rhythm at 72 per minute. First and second heart sounds normal.  No murmurs, rubs or gallops.  Abdominal exam reveals normal bowl sounds, no masses, no organomegaly and no aortic enlargement. No inguinal adenopathy. There was slight abdominal tenderness in the right upper quadrant. Extremities are nonedematous and both femoral and pedal pulses are normal. Skin without pallor or jaundice.  Warm and dry, without rash. Neurologic exam reveals normal deep tendon reflexes and normal sensation. Diabetic foot exam was done today          Assessment & Plan:  1. IDDM (insulin dependent diabetes mellitus) - BMP8+EGFR; Standing - POCT glycosylated hemoglobin (Hb A1C); Standing  2. Hypothyroid  3. Hyperlipidemia - Hepatic function panel; Standing - NMR, lipoprofile; Standing  4. HTN (hypertension)  5. Allergic rhinitis -Use Flonase regularly  Patient Instructions  Continue current meds and therapeutic lifestyle changes Return FOBT Continue current medications  Don't forget to get your flu shot this the fall   We will schedule visit for pelvic exam and mammogram. Continue to follow out with a clinical pharmacist for weight loss  Nyra Capes MD

## 2012-09-23 NOTE — Patient Instructions (Addendum)
Continue current meds and therapeutic lifestyle changes Return FOBT Continue current medications  Don't forget to get your flu shot this the fall

## 2012-10-06 ENCOUNTER — Other Ambulatory Visit: Payer: BC Managed Care – PPO

## 2012-10-06 ENCOUNTER — Other Ambulatory Visit: Payer: Self-pay | Admitting: Family Medicine

## 2012-10-13 ENCOUNTER — Ambulatory Visit (INDEPENDENT_AMBULATORY_CARE_PROVIDER_SITE_OTHER): Payer: BC Managed Care – PPO | Admitting: Obstetrics & Gynecology

## 2012-10-13 ENCOUNTER — Encounter: Payer: Self-pay | Admitting: Obstetrics & Gynecology

## 2012-10-13 VITALS — BP 127/80 | HR 96 | Resp 16 | Ht 64.0 in | Wt 193.0 lb

## 2012-10-13 DIAGNOSIS — Z124 Encounter for screening for malignant neoplasm of cervix: Secondary | ICD-10-CM

## 2012-10-13 DIAGNOSIS — Z1151 Encounter for screening for human papillomavirus (HPV): Secondary | ICD-10-CM

## 2012-10-13 DIAGNOSIS — Z01419 Encounter for gynecological examination (general) (routine) without abnormal findings: Secondary | ICD-10-CM

## 2012-10-13 NOTE — Progress Notes (Signed)
Patient ID: SHAVON ASHMORE, female   DOB: 1960-10-16, 52 y.o.   MRN: [id number]  Chief Complaint  Patient presents with  . Gynecologic Exam    Pt has a lot of abd cramping all through the month whether she has a cycle or not     HPI Carl Bleecker Ruggerio is a 52 y.o. female.  [id number] Patient's last menstrual period was 10/06/2012. Menses irregular,about 3/year now, has VMS. Low back pain, abdominal discomfort. Will have GB removed soon, not functioning.  HPI  Past Medical History  Diagnosis Date  . IDDM (insulin dependent diabetes mellitus)     AODM   . Migraines   . Trochanteric bursitis of left hip   . Shoulder pain   . Hypertension   . Thyroid disease   . PCOS (polycystic ovarian syndrome) 1986  . Vaginal cysts     4 have been removed    Past Surgical History  Procedure Laterality Date  . Appendectomy  1967  . Nsvd  2001  . Sonobysterogram, laporoscopy  1997  3rd degree laceration at delivery  Family History  Problem Relation Age of Onset  . Hypertension Mother   . Diabetes Mother   . Hypertension Father   . Cancer Father   . Hypertension Sister   . Hypertension Maternal Grandmother   . Heart attack Maternal Grandfather   . Diabetes Maternal Grandfather   . Diabetes Paternal Grandfather     Social History History  Substance Use Topics  . Smoking status: Never Smoker   . Smokeless tobacco: Never Used  . Alcohol Use: No    No Known Allergies  Current Outpatient Prescriptions  Medication Sig Dispense Refill  . aspirin (BABY ASPIRIN) 81 MG chewable tablet Chew 81 mg by mouth daily.        [name] BYDUREON 2 MG SUSR INJECT 2MG  UNDER THE SKIN ONCE A WEEK AS INSTRUCTED  4 each  2  . CRESTOR 5 MG tablet TAKE ONE TABLET BY MOUTH ONE TIME DAILY  30 tablet  5  . escitalopram (LEXAPRO) 20 MG tablet Take 10 mg by mouth daily.       . fluticasone (FLONASE) 50 MCG/ACT nasal spray Place 2 sprays into the nose daily.  16 g  5  . Insulin Aspart (NOVOLOG FLEXPEN [place]) Inject 6 Units  into the skin daily before lunch.       . Insulin Detemir (LEVEMIR FLEXPEN) 100 UNIT/ML SOPN Inject 42 Units into the skin daily.  15 mL  4  . levothyroxine (SYNTHROID) 50 MCG tablet Take 1 tablet (50 mcg total) by mouth daily before breakfast.  30 tablet  5  . metFORMIN (GLUCOPHAGE) 500 MG tablet TAKE 2 TABLETS BY MOUTH EVERY MORNING AND 3 TABLETS EVERY EVENING  150 tablet  2  . minocycline (MINOCIN,DYNACIN) 100 MG capsule TAKE ONE CAPSULE BY MOUTH TWICE DAILY  60 capsule  2  . Moexipril-Hydrochlorothiazide 15-12.5 MG TABS TAKE ONE TABLET BY MOUTH ONE TIME DAILY  30 each  4  . ONE TOUCH ULTRA TEST test strip USE TO CHECK BLOOD SUGAR 3 TIMES DAILY  100 each  2   No current facility-administered medications for this visit.    Review of Systems Review of Systems  Constitutional: Negative for fever and unexpected weight change.  Gastrointestinal: Positive for abdominal pain and diarrhea. Negative for anal bleeding.  Genitourinary: Positive for frequency, vaginal discharge, menstrual problem and pelvic pain. Negative for vaginal bleeding.  Skin:       Forms  keloids    Blood pressure 127/80, pulse 96, resp. rate 16, height 5\' 4"  (1.626 m), weight 193 lb (87.544 kg), last menstrual period 10/06/2012.  Physical Exam Physical Exam  Nursing note and vitals reviewed. Constitutional: She appears well-developed. No distress.  overweight  Neck: Normal range of motion.  Cardiovascular: Normal rate, regular rhythm and normal heart sounds.   Pulmonary/Chest: Effort normal and breath sounds normal. No respiratory distress.  3cm round slightly raised red skin lesion left breast, keloid she states, no masses, no LA  Genitourinary: Uterus normal. No vaginal discharge found.  Mild atrophy, perineum attenuated, cervix nl, pap done, no masses  Neurological: She is alert.  Skin: Skin is warm and dry.  Psychiatric: She has a normal mood and affect. Her behavior is normal.    Data Reviewed Office  notes  Assessment    Well-woman exam Abdominal and pelvic discomfort with GI symptoms   Perimenopause  Plan   Mammogram    Yearly exams  ARNOLD,JAMES 10/13/2012, 9:36 AM

## 2012-10-13 NOTE — Patient Instructions (Signed)
Mammography  Mammography is an X-ray of the breasts to look for changes that are not normal. The X-ray image is called a mammogram. This procedure can screen for breast cancer, can detect cancer early, and can diagnose cancer.   LET YOUR CAREGIVER KNOW ABOUT:  · Breast implants.  · Previous breast disease, biopsy, or surgery.  · If you are breastfeeding.  · Medicines taken, including vitamins, herbs, eyedrops, over-the-counter medicines, and creams.  · Use of steroids (by mouth or creams).  · Possibility of pregnancy, if this applies.  RISKS AND COMPLICATIONS  · Exposure to radiation, but at very low levels.  · The results may be misinterpreted.  · The results may not be accurate.  · Mammography may lead to further tests.  · Mammography may not catch certain cancers.  BEFORE THE PROCEDURE  · Schedule your test about 7 days after your menstrual period. This is when your breasts are the least tender and have signs of hormone changes.  · If you have had a mammography done at a different facility in the past, get the mammogram X-rays or have them sent to your current exam facility in order to compare them.  · Wash your breasts and under your arms the day of the test.  · Do not wear deodorants, perfumes, or powders anywhere on your body.  · Wear clothes that you can change in and out of easily.  PROCEDURE  Relax as much as possible during the test. Any discomfort during the test will be very brief. The test should take less than 30 minutes. The following will happen:  · You will undress from the waist up and put on a gown.  · You will stand in front of the X-ray machine.  · Each breast will be placed between 2 plastic or glass plates. The plates will compress your breast for a few seconds.  · X-rays will be taken from different angles of the breast.  AFTER THE PROCEDURE  · The mammogram will be examined.  · Depending on the quality of the images, you may need to repeat certain parts of the test.  · Ask when your test  results will be ready. Make sure you get your test results.  · You may resume normal activities.  Document Released: 01/12/2000 Document Revised: 04/08/2011 Document Reviewed: 11/04/2010  [employer]® Patient Information ©2014 [employer].

## 2012-10-20 ENCOUNTER — Encounter: Payer: Self-pay | Admitting: *Deleted

## 2012-10-21 ENCOUNTER — Other Ambulatory Visit: Payer: BC Managed Care – PPO | Admitting: Nurse Practitioner

## 2012-10-27 ENCOUNTER — Encounter (INDEPENDENT_AMBULATORY_CARE_PROVIDER_SITE_OTHER): Payer: Self-pay

## 2012-11-04 ENCOUNTER — Ambulatory Visit: Payer: BC Managed Care – PPO | Admitting: Family Medicine

## 2012-11-06 ENCOUNTER — Other Ambulatory Visit: Payer: Self-pay | Admitting: Family Medicine

## 2012-11-09 NOTE — Telephone Encounter (Signed)
Last filled 07/22/12 with 2 RF, last seen 09/23/12

## 2013-01-06 ENCOUNTER — Ambulatory Visit: Payer: BC Managed Care – PPO | Admitting: Family Medicine

## 2013-01-25 ENCOUNTER — Telehealth: Payer: Self-pay | Admitting: Family Medicine

## 2013-01-25 NOTE — Telephone Encounter (Signed)
After speaking with Kayla Berger pt was advised she would need to take care of account first then could be seen. Advised pt it is in her best interest to be seen and specimen checked at time of visit. Pt stated coiuld not get off work to be seen. Advised pt to try the urgent care since they may can accomdate her with later hours. Pt verbalized understanding

## 2013-11-29 ENCOUNTER — Encounter: Payer: Self-pay | Admitting: Obstetrics & Gynecology

## 2013-11-30 IMAGING — US US ABDOMEN LIMITED
1 series · 14 of 25 positions shown · non-contrast
Comparison: 10/04/2011

CLINICAL DATA: Right upper quadrant pain.

LIMITED ABDOMINAL ULTRASOUND - RIGHT UPPER QUADRANT

[Series 1: us abdomen limited · 0.33mm/px · 14 of 55 slices shown]
[im 1/55]
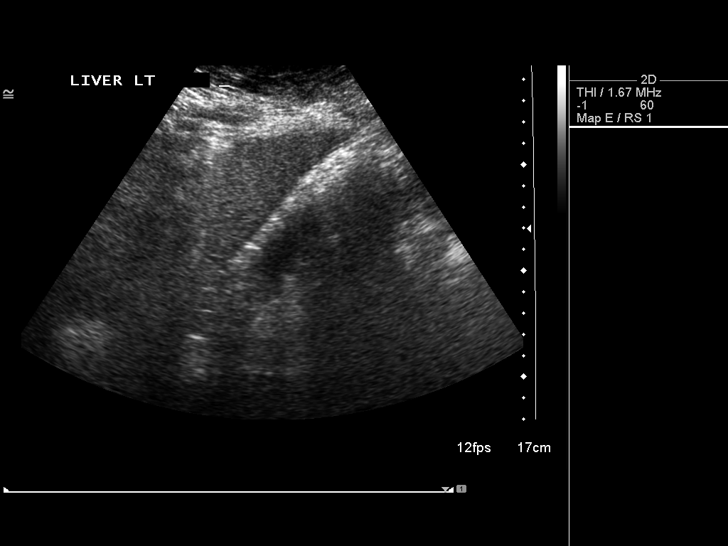
[im 5/55]
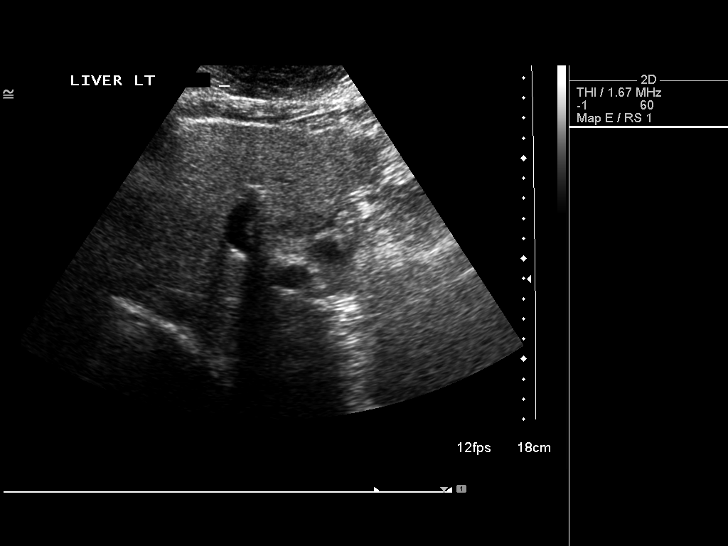
[im 10/55]
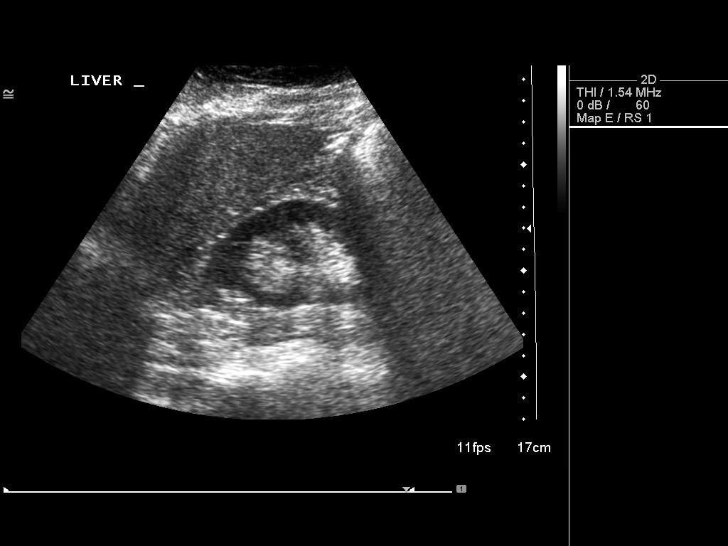
[im 14/55]
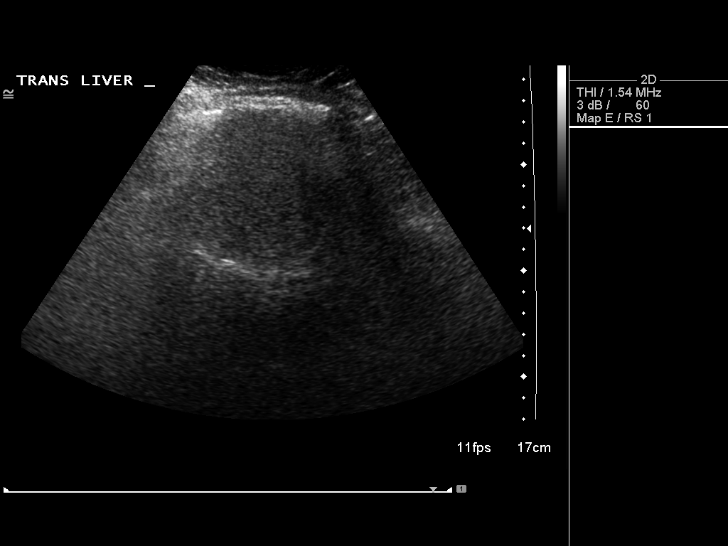
[im 19/55]
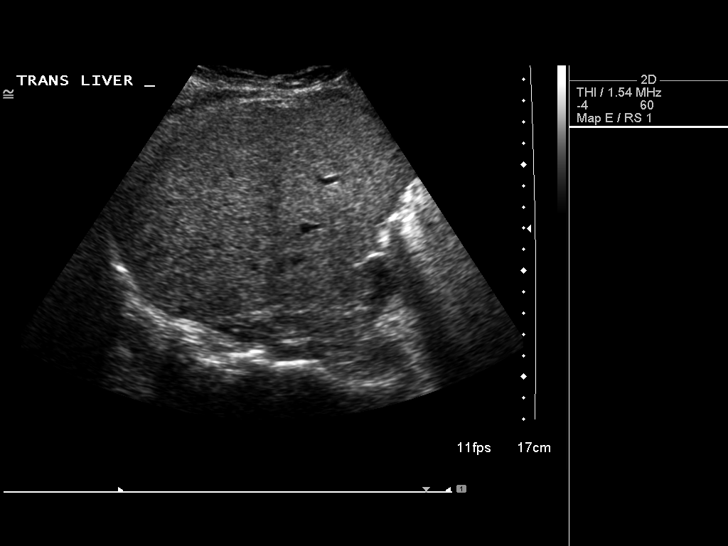
[im 21/55]
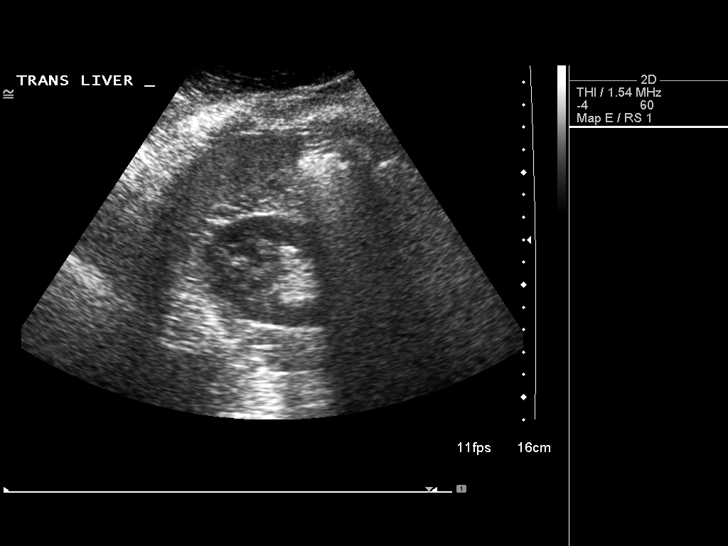
[im 25/55]
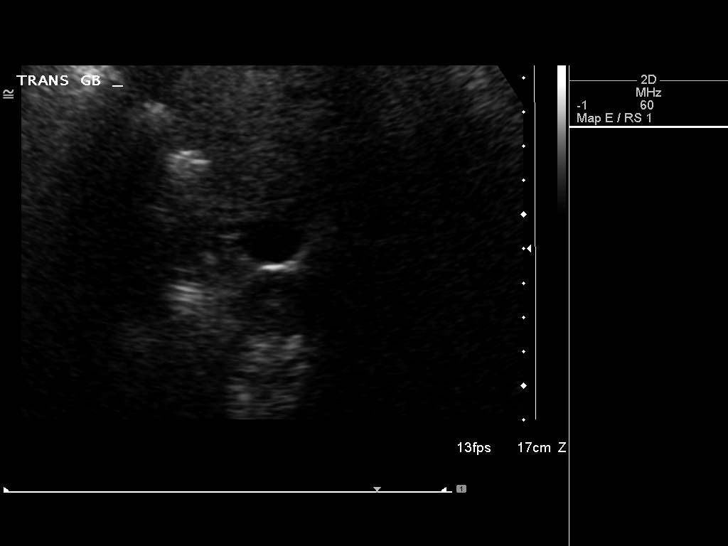
[im 30/55]
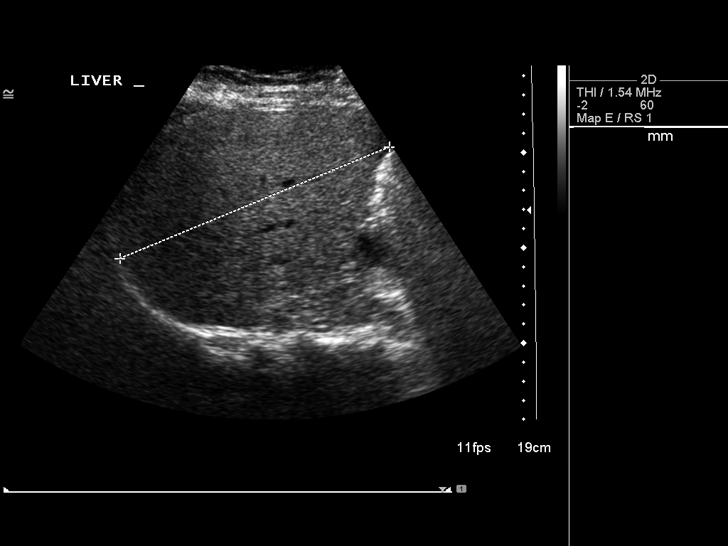
[im 34/55]
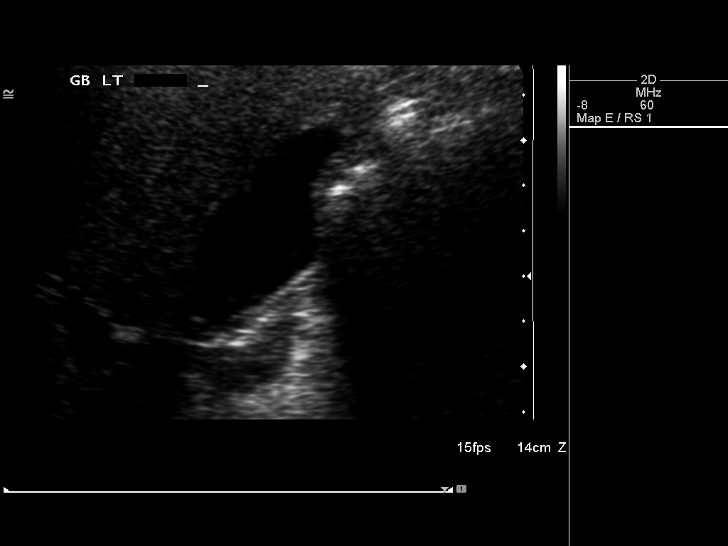
[im 37/55]
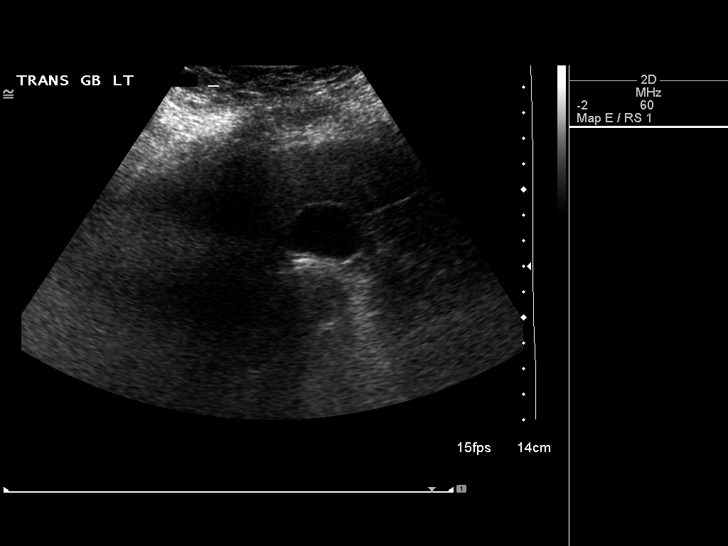
[im 41/55]
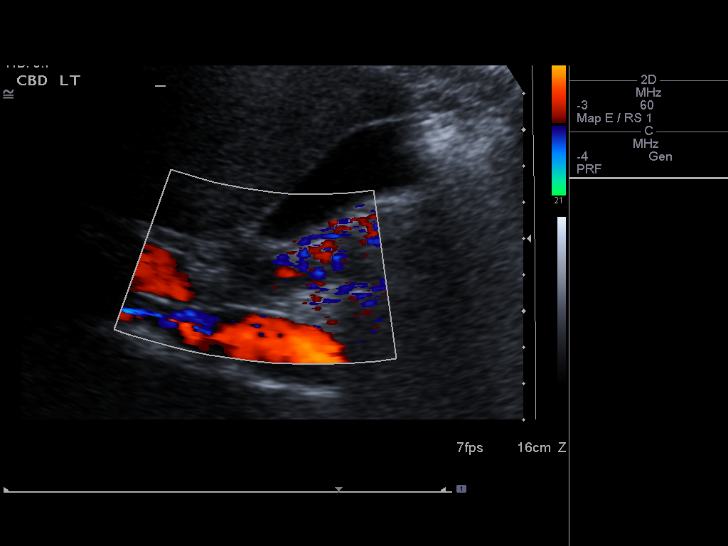
[im 46/55]
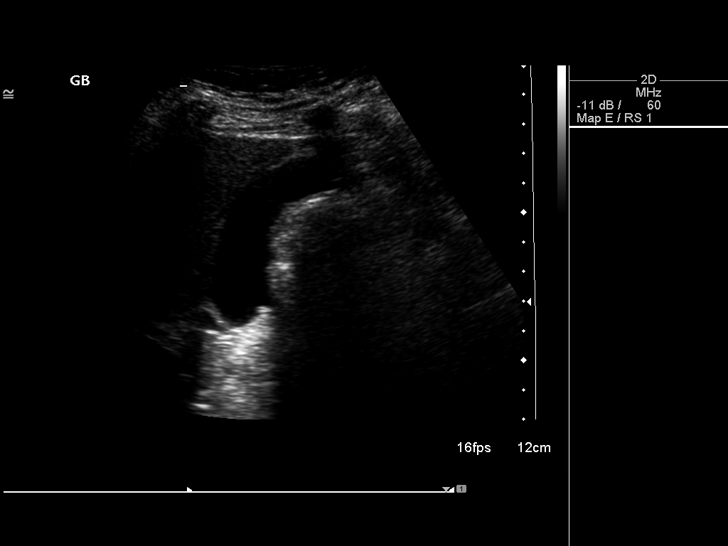
[im 50/55]
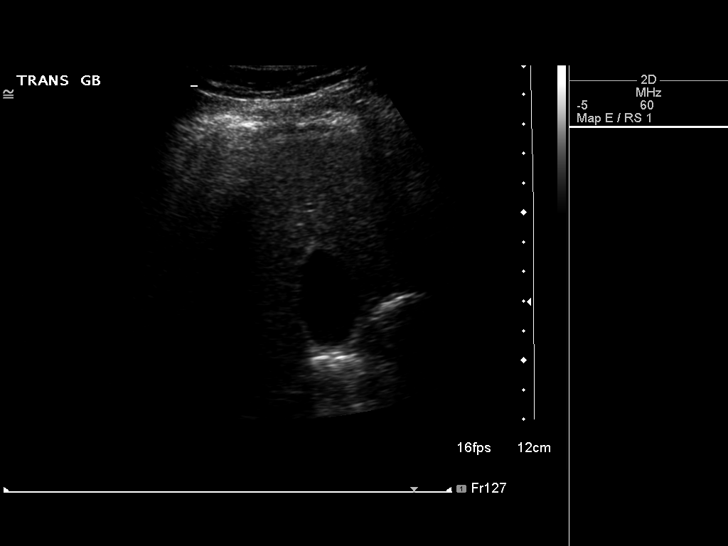
[im 55/55]
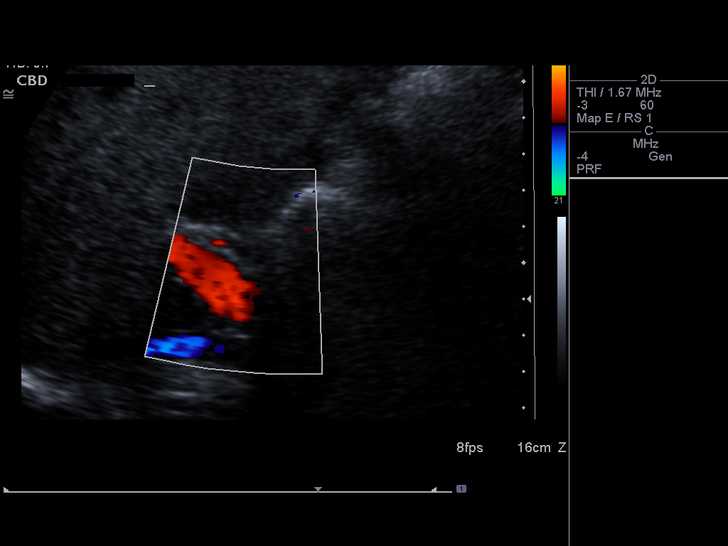

[14 of 25 positions shown; findings below may reference images not displayed]

FINDINGS: Gallbladder:  No evidence for gallstones, wall thickening or
sludge.  However, the patient is very tender in the right upper
quadrant.

Common bile duct:  Measures 3 mm.

Liver:  Liver is slightly echogenic and heterogeneous.  Findings
could represent hepatic steatosis.  Main portal vein is patent.
IMPRESSION: Negative for a gallstones.  The patient is very tender in the right
upper quadrant and had a positive sonographic Murphy's sign.

The liver is heterogeneous and slightly echogenic.  There may be a
component of hepatic steatosis.

## 2013-12-06 IMAGING — NM NM HEPATO W/GB/PHARM/[PERSON_NAME]
1 series · 1 of 1 positions shown · non-contrast
Comparison: Ultrasound 06/26/2012

CLINICAL DATA: Right upper quadrant pain

NUCLEAR MEDICINE HEPATOBILIARY IMAGING WITH GALLBLADDER EF
TECHNIQUE: Sequential images of the abdomen were obtained [DATE]
minutes following intravenous administration of
radiopharmaceutical.  After slow intravenous infusion of 1.6 ucg
Cholecystokinin, gallbladder ejection fraction was determined.
Radiopharmaceutical:  5.0 mCi Ic-IIm Choletec

[hepatobiliary · 1 of 1 slices shown]
[im 1/1]
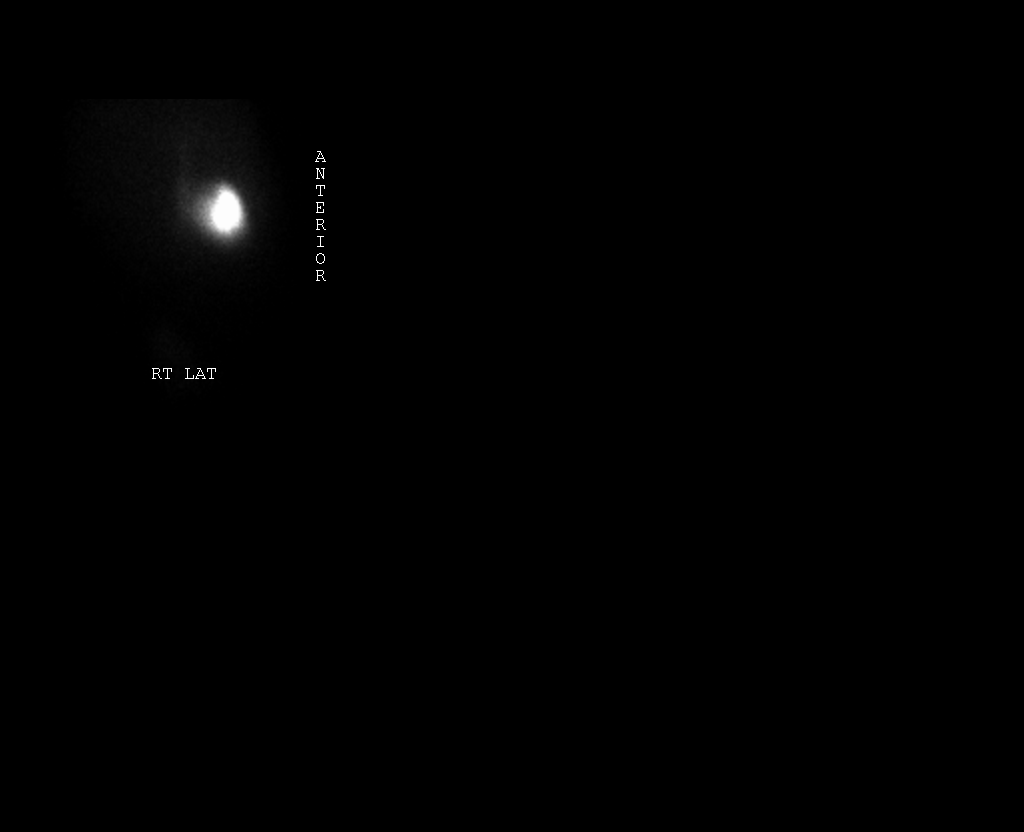

[1 of 1 positions shown; findings below may reference images not displayed]

FINDINGS: There is prompt extraction of radiotracer from the blood
pool and homogeneous uptake within the liver.  The gallbladder is
evident by 15 minutes.  Counts are present within the bowel by 40
minutes.  Upon administration of a cholecystokinin, the gallbladder
contracts appropriately with a calculated ejection fraction = 18%
at 30 min (Normal greater than 30 % ejection).
IMPRESSION: 1. Low gallbladder ejection fraction =  18% . Common differential
diagnosis includes biliary dyskinesia, chronic cholecystitis, and
sphincter Oddi dysfunction.
2. Patent cystic duct and common bile duct.

## 2015-06-07 ENCOUNTER — Ambulatory Visit (INDEPENDENT_AMBULATORY_CARE_PROVIDER_SITE_OTHER): Payer: Self-pay

## 2015-06-07 ENCOUNTER — Ambulatory Visit (INDEPENDENT_AMBULATORY_CARE_PROVIDER_SITE_OTHER): Payer: Self-pay | Admitting: Orthopedic Surgery

## 2015-06-07 VITALS — BP 140/97 | HR 97 | Ht 63.5 in | Wt 152.0 lb

## 2015-06-07 DIAGNOSIS — M25512 Pain in left shoulder: Secondary | ICD-10-CM

## 2015-06-07 DIAGNOSIS — M7502 Adhesive capsulitis of left shoulder: Secondary | ICD-10-CM

## 2015-06-07 MED ORDER — HYDROCODONE-ACETAMINOPHEN 5-325 MG PO TABS: 1.0000 | ORAL_TABLET | Freq: Four times a day (QID) | ORAL | Status: DC | PRN

## 2015-06-07 NOTE — Patient Instructions (Signed)
CALL THE THERAPY DEPT TO SCHEDULE THERAPY [phone]

## 2015-06-07 NOTE — Progress Notes (Signed)
Patient ID: Kayla Berger, female   DOB: Dec 12, 1960, 55 y.o.   MRN: [id number]  Chief Complaint  Patient presents with  . Shoulder Pain    Left shoulder pain for 8 to 10 months    HPI Kayla Berger is a 55 y.o. female.  Who presents today to 10 month history of locked up left shoulder. She had a history of her right shoulder locking up several years ago and did well with physical therapy and says this feels similar.  HPI She complains of constant left shoulder pain with locking unrelieved by ibuprofen and cyclobenzaprine or MSM supplement. She tried heating pad ice but nothing has helped  No trauma   Review of Systems Review of Systems Neuro symptoms include left upper extremity radiating pain Denies fever   Past Medical History  Diagnosis Date  . IDDM (insulin dependent diabetes mellitus)     AODM   . Migraines   . Trochanteric bursitis of left hip   . Shoulder pain   . Hypertension   . Thyroid disease   . PCOS (polycystic ovarian syndrome) 1986  . Vaginal cysts     4 have been removed    Past Surgical History  Procedure Laterality Date  . Appendectomy  1967  . Nsvd  2001  . Sonobysterogram, laporoscopy  1997    Family History  Problem Relation Age of Onset  . Hypertension Mother   . Diabetes Mother   . Hypertension Father   . Cancer Father   . Hypertension Sister   . Hypertension Maternal Grandmother   . Heart attack Maternal Grandfather   . Diabetes Maternal Grandfather   . Diabetes Paternal Grandfather    The patient was quizzed about their family history and reported no history of bleeding problems or anesthesia problems in their family  Social History Social History  Substance Use Topics  . Smoking status: Never Smoker   . Smokeless tobacco: Never Used  . Alcohol Use: No    No Known Allergies  Current Outpatient Prescriptions  Medication Sig Dispense Refill  . aspirin (BABY ASPIRIN) 81 MG chewable tablet Chew 81 mg by mouth daily.      .  Biotin 5000 MCG CAPS Take by mouth.    . cholecalciferol (VITAMIN D) 1000 units tablet Take 2,000 Units by mouth daily.    . cyclobenzaprine (FLEXERIL) 5 MG tablet Take 5 mg by mouth 3 (three) times daily as needed for muscle spasms.    [name] ibuprofen (ADVIL,MOTRIN) 200 MG tablet Take 200 mg by mouth every 6 (six) hours as needed.    . metFORMIN (GLUCOPHAGE) 500 MG tablet TAKE 2 TABLETS BY MOUTH EVERY MORNING AND 3 TABLETS EVERY EVENING 150 tablet 2  . ONE TOUCH ULTRA TEST test strip USE TO CHECK BLOOD SUGAR 3 TIMES DAILY 100 each 2  . OVER THE COUNTER MEDICATION 1,000 mg. Joint supplement    . HYDROcodone-acetaminophen (NORCO) 5-325 MG tablet Take 1 tablet by mouth every 6 (six) hours as needed for moderate pain. 60 tablet 0   No current facility-administered medications for this visit.    Physical Exam Blood pressure 140/97, pulse 97, height 5' 3.5" (1.613 m), weight 152 lb (68.947 kg). Physical Exam The patient is well developed well nourished and well groomed.  Orientation to person place and time is normal  Mood is pleasant.  Ambulatory status NORMAL  Cervical spine exam is as follows Nontender cervical spine no restriction in motion lymph nodes negative  Ortho Exam Left  shoulder  Examination: Inspection reveals tenderness around the peri-acromial region. The patient has decreased range of motion with only 5 of external rotation when her arm is at her side she only has 40 of abduction and 40 of flexion  As best we can tell she has grade 5 motor function in the cuff. We could not test stability in abduction external rotation but the sulcus sign for inferior stability was normal  Neurovascular examination is intact and the lymph nodes in the axilla and supraclavicular regions are normal   The opposite shoulder LEFT exhibits normal range of motion stability and strength neurovascular exam is intact, lymph nodes are negative and there is no swelling or tenderness   Data  Reviewed IMAGING: AP/LAT left Shoulder: I have read and interpret the x-ray as follows:   normal   Assessment    Left SHOULDER frozen shoulder    Plan Therapy  INJECTION Procedure note the subacromial injection shoulder left   Verbal consent was obtained to inject the  Left   Shoulder  Timeout was completed to confirm the injection site is a subacromial space of the  left  shoulder  Medication used Depo-Medrol 40 mg and lidocaine 1% 3 cc  Anesthesia was provided by ethyl chloride  The injection was performed in the left  posterior subacromial space. After pinning the skin with alcohol and anesthetized the skin with ethyl chloride the subacromial space was injected using a 20-gauge needle. There were no complications  Sterile dressing was applied.  Meds ordered this encounter  Medications  . cholecalciferol (VITAMIN D) 1000 units tablet    Sig: Take 2,000 Units by mouth daily.  [name]. OVER THE COUNTER MEDICATION    Sig: 1,000 mg. Joint supplement  . ibuprofen (ADVIL,MOTRIN) 200 MG tablet    Sig: Take 200 mg by mouth every 6 (six) hours as needed.  . Biotin 5000 MCG CAPS    Sig: Take by mouth.  . cyclobenzaprine (FLEXERIL) 5 MG tablet    Sig: Take 5 mg by mouth 3 (three) times daily as needed for muscle spasms.  [name]. HYDROcodone-acetaminophen (NORCO) 5-325 MG tablet    Sig: Take 1 tablet by mouth every 6 (six) hours as needed for moderate pain.    Dispense:  60 tablet    Refill:  0    Return in 7 weeks after 6 weeks of therapy

## 2015-07-26 ENCOUNTER — Ambulatory Visit: Payer: Self-pay | Admitting: Orthopedic Surgery

## 2015-11-22 ENCOUNTER — Encounter (INDEPENDENT_AMBULATORY_CARE_PROVIDER_SITE_OTHER): Payer: Self-pay

## 2015-11-22 ENCOUNTER — Encounter (INDEPENDENT_AMBULATORY_CARE_PROVIDER_SITE_OTHER): Payer: Self-pay | Admitting: Internal Medicine

## 2015-12-05 ENCOUNTER — Encounter (INDEPENDENT_AMBULATORY_CARE_PROVIDER_SITE_OTHER): Payer: Self-pay | Admitting: *Deleted

## 2015-12-05 ENCOUNTER — Ambulatory Visit (INDEPENDENT_AMBULATORY_CARE_PROVIDER_SITE_OTHER): Payer: Self-pay | Admitting: Internal Medicine

## 2015-12-05 ENCOUNTER — Encounter (INDEPENDENT_AMBULATORY_CARE_PROVIDER_SITE_OTHER): Payer: Self-pay | Admitting: Internal Medicine

## 2015-12-05 ENCOUNTER — Encounter (INDEPENDENT_AMBULATORY_CARE_PROVIDER_SITE_OTHER): Payer: Self-pay

## 2015-12-05 VITALS — BP 144/90 | HR 84 | Temp 98.3°F | Ht 64.0 in | Wt 181.8 lb

## 2015-12-05 DIAGNOSIS — R1011 Right upper quadrant pain: Secondary | ICD-10-CM

## 2015-12-05 NOTE — Progress Notes (Signed)
   Subjective:    Patient ID: Kayla Berger, female    DOB: 1960/06/16, 55 y.o.   MRN: [id number]  HPI Referred by Tharon AquasJoanne Carter FNP for RUQ pain x 2 months.. Pain radiates from front to back. Hx of same. In 2014, HIDA scan revealed EF of 18%. She was referred for surgical consult. She states she could not afford the surgery and also the pain stopped. Recently underwent an US 11/22/2015 which revealed no gallstones within the GB. Positive sonographic Murphy's sign. Early cholecystitis cannot be excluded.  Her appetite is okay. She is avoiding fatty foods because of the RUQ pain. She is not eating red meat.  Occasionally dairy products will bother her.  She has a BM x 1 day. No melena or BRRB.    Hx of DM2.  11/13/2015 HA1C 10.9   11/13/2015 H and H 13.5 and 40.2, ALP 99, AST 21, ALT 34.   Review of Systems Past Medical History:  Diagnosis Date  . Hypertension   . IDDM (insulin dependent diabetes mellitus) (HCC)    AODM   . Migraines   . PCOS (polycystic ovarian syndrome) 1986  . Shoulder pain   . Thyroid disease   . Trochanteric bursitis of left hip   . Vaginal cysts    4 have been removed    Past Surgical History:  Procedure Laterality Date  . APPENDECTOMY  1967  . nsvd  2001  . sonobysterogram, laporoscopy  1997    No Known Allergies  Current Outpatient Prescriptions on File Prior to Visit  Medication Sig Dispense Refill  . aspirin (BABY ASPIRIN) 81 MG chewable tablet Chew 81 mg by mouth daily.      . Biotin 5000 MCG CAPS Take by mouth.    . cholecalciferol (VITAMIN D) 1000 units tablet Take 2,000 Units by mouth daily.    . cyclobenzaprine (FLEXERIL) 5 MG tablet Take 5 mg by mouth 2 (two) times daily after a meal.     . ibuprofen (ADVIL,MOTRIN) 200 MG tablet Take 200 mg by mouth every 6 (six) hours as needed.    . metFORMIN (GLUCOPHAGE) 500 MG tablet TAKE 2 TABLETS BY MOUTH EVERY MORNING AND 3 TABLETS EVERY EVENING (Patient taking differently: TAKE 2 TABLETS BY MOUTH  EVERY MORNING AND 2 TABLETS EVERY EVENING) 150 tablet 2  . ONE TOUCH ULTRA TEST test strip USE TO CHECK BLOOD SUGAR 3 TIMES DAILY 100 each 2   No current facility-administered medications on file prior to visit.        Objective:   Physical Exam Blood pressure (!) 144/90, pulse 84, temperature 98.3 F (36.8 C), height 5\' 4"  (1.626 m), weight 181 lb 12.8 oz (82.5 kg).  Alert and oriented. Skin warm and dry. Oral mucosa is moist.   . Sclera anicteric, conjunctivae is pink. Thyroid not enlarged. No cervical lymphadenopathy. Lungs clear. Heart regular rate and rhythm.  Abdomen is soft. Bowel sounds are positive. No hepatomegaly. No abdominal masses felt. No tenderness.  No edema to lower extremities.         Assessment & Plan:  RUQ pain. Am going to get a HIDA scan. Further recommendations to follow.  May need a surgical consult.

## 2015-12-05 NOTE — Patient Instructions (Signed)
HIDA scan.

## 2015-12-06 ENCOUNTER — Telehealth (INDEPENDENT_AMBULATORY_CARE_PROVIDER_SITE_OTHER): Payer: Self-pay | Admitting: *Deleted

## 2015-12-06 NOTE — Telephone Encounter (Signed)
noted 

## 2015-12-06 NOTE — Telephone Encounter (Signed)
Patient left message to cancel HIDA scan sch'd for 12/07/15, said she would call back at a later date to resch'd

## 2015-12-07 ENCOUNTER — Ambulatory Visit (HOSPITAL_COMMUNITY): Payer: Self-pay

## 2016-10-09 ENCOUNTER — Ambulatory Visit: Payer: Self-pay | Admitting: Physician Assistant

## 2016-10-09 ENCOUNTER — Encounter: Payer: Self-pay | Admitting: Physician Assistant

## 2016-10-09 VITALS — BP 110/72 | HR 118 | Temp 97.9°F | Wt 166.6 lb

## 2016-10-09 DIAGNOSIS — R000 Tachycardia, unspecified: Secondary | ICD-10-CM

## 2016-10-09 DIAGNOSIS — I10 Essential (primary) hypertension: Secondary | ICD-10-CM

## 2016-10-09 DIAGNOSIS — E118 Type 2 diabetes mellitus with unspecified complications: Secondary | ICD-10-CM

## 2016-10-09 DIAGNOSIS — E1165 Type 2 diabetes mellitus with hyperglycemia: Secondary | ICD-10-CM

## 2016-10-09 DIAGNOSIS — Z8639 Personal history of other endocrine, nutritional and metabolic disease: Secondary | ICD-10-CM

## 2016-10-09 DIAGNOSIS — M549 Dorsalgia, unspecified: Secondary | ICD-10-CM

## 2016-10-09 MED ORDER — CYCLOBENZAPRINE HCL 10 MG PO TABS: 10.0000 mg | ORAL_TABLET | Freq: Three times a day (TID) | ORAL | 0 refills | Status: DC | PRN

## 2016-10-09 NOTE — Progress Notes (Signed)
BP 110/72 (BP Location: Left Arm, Patient Position: Sitting, Cuff Size: Normal)   Pulse (!) 118   Temp 97.9 F (36.6 C)   Wt 166 lb 9.6 oz (75.6 kg)   SpO2 99%   BMI 28.60 kg/m    Subjective:    Patient ID: Kayla Berger, female    DOB: March 28, 1960, 56 y.o.   MRN: [id number]  HPI: Kayla FowlerJonsie L Langworthy is a 56 y.o. female presenting on 10/09/2016 for Follow-up   HPI   I am seeing pt [facility] as interim provider until new permanent provider takes over mid-October  Pt is scheduled for back surgery on 10/31/16.    Pt's last routine labs were done in march.  Pt is not having any SOB or CP.   Pt was diagnosed with dm age 56.    ? Mammogram- pt thinks it was about a year ago.  .  Last PAP ? - pt is uncertain.   Pt has has seen eye dr multipile this year.   Relevant past medical, surgical, family and social history reviewed and updated as indicated. Interim medical history since our last visit reviewed. Allergies and medications reviewed and updated.   Current Outpatient Prescriptions:  .  aspirin (BABY ASPIRIN) 81 MG chewable tablet, Chew 81 mg by mouth daily.  , Disp: , Rfl:  .  atorvastatin (LIPITOR) 20 MG tablet, Take 20 mg by mouth daily., Disp: , Rfl:  .  Biotin 5000 MCG CAPS, Take 1,000 mcg by mouth daily. , Disp: , Rfl:  .  cholecalciferol (VITAMIN D) 1000 units tablet, Take 2,000 Units by mouth daily., Disp: , Rfl:  .  cyclobenzaprine (FLEXERIL) 10 MG tablet, Take 10 mg by mouth 3 (three) times daily as needed for muscle spasms., Disp: , Rfl:  .  glipiZIDE (GLUCOTROL XL) 2.5 MG 24 hr tablet, Take 2.5 mg by mouth daily with breakfast., Disp: , Rfl:  .  Insulin Glargine (BASAGLAR KWIKPEN) 100 UNIT/ML SOPN, Inject 58 Units into the skin daily., Disp: , Rfl:  .  lisinopril-hydrochlorothiazide (PRINZIDE,ZESTORETIC) 10-12.5 MG tablet, Take 1 tablet by mouth daily., Disp: , Rfl:  .  metFORMIN (GLUCOPHAGE) 500 MG tablet, TAKE 2 TABLETS BY MOUTH EVERY MORNING AND 3  TABLETS EVERY EVENING (Patient taking differently: TAKE 2 TABLETS BY MOUTH EVERY MORNING AND 2 TABLETS EVERY EVENING), Disp: 150 tablet, Rfl: 2  Review of Systems  Constitutional: Negative for appetite change, chills, diaphoresis, fatigue, fever and unexpected weight change.  HENT: Negative for congestion, drooling, ear pain, facial swelling, hearing loss, mouth sores, sneezing, sore throat, trouble swallowing and voice change.   Eyes: Negative for pain, discharge, redness, itching and visual disturbance.  Respiratory: Negative for cough, choking, shortness of breath and wheezing.   Cardiovascular: Negative for chest pain, palpitations and leg swelling.  Gastrointestinal: Negative for abdominal pain, blood in stool, constipation, diarrhea and vomiting.  Endocrine: Negative for cold intolerance, heat intolerance and polydipsia.  Genitourinary: Negative for decreased urine volume, dysuria and hematuria.  Musculoskeletal: Positive for arthralgias and back pain. Negative for gait problem.  Skin: Negative for rash.  Allergic/Immunologic: Negative for environmental allergies.  Neurological: Positive for headaches. Negative for seizures, syncope and light-headedness.  Hematological: Negative for adenopathy.  Psychiatric/Behavioral: Negative for agitation, dysphoric mood and suicidal ideas. The patient is nervous/anxious.     Per HPI unless specifically indicated above     Objective:    BP 110/72 (BP Location: Left Arm, Patient Position: Sitting, Cuff Size: Normal)  Pulse (!) 118   Temp 97.9 F (36.6 C)   Wt 166 lb 9.6 oz (75.6 kg)   SpO2 99%   BMI 28.60 kg/m   Wt Readings from Last 3 Encounters:  10/09/16 166 lb 9.6 oz (75.6 kg)  12/05/15 181 lb 12.8 oz (82.5 kg)  06/07/15 152 lb (68.9 kg)    Physical Exam  Constitutional: She is oriented to person, place, and time. She appears well-developed and well-nourished.  HENT:  Head: Normocephalic and atraumatic.  Neck: Neck supple.   Cardiovascular: Normal rate and regular rhythm.   Pulmonary/Chest: Effort normal and breath sounds normal.  Abdominal: Soft. Bowel sounds are normal. She exhibits no mass. There is no hepatosplenomegaly. There is no tenderness.  Musculoskeletal: She exhibits no edema.  Lymphadenopathy:    She has no cervical adenopathy.  Neurological: She is alert and oriented to person, place, and time.  Skin: Skin is warm and dry.  Psychiatric: She has a normal mood and affect. Her behavior is normal.  Vitals reviewed.   EKG- sinus tachycardia at 111 bpm. No acute st-t changes seen.   No previous for comparison.      Assessment & Plan:    Encounter Diagnoses  Name Primary?  . Tachycardia Yes  . Uncontrolled type 2 diabetes mellitus with complication, unspecified whether long term insulin use (HCC)   . Essential hypertension   . History of thyroid disorder   . Back pain, unspecified back location, unspecified back pain laterality, unspecified chronicity     -pt Needs recheck fasting labs- lipids, cmp, a1c, tsh, cbc- we will call pt with results -gave pt Refill on flexeril -will likely order echo after lab results return.  Pt may benefit from beta-blocker. Will review labs prior to change.  -pt to follow up 1 month.  RTO sooner prn

## 2016-10-14 ENCOUNTER — Ambulatory Visit: Payer: Self-pay | Admitting: Physician Assistant

## 2016-10-16 ENCOUNTER — Ambulatory Visit: Payer: Self-pay | Admitting: Physician Assistant

## 2016-10-16 ENCOUNTER — Encounter: Payer: Self-pay | Admitting: Physician Assistant

## 2016-10-16 VITALS — BP 120/76 | HR 116 | Temp 97.9°F | Wt 168.4 lb

## 2016-10-16 DIAGNOSIS — E1165 Type 2 diabetes mellitus with hyperglycemia: Secondary | ICD-10-CM

## 2016-10-16 DIAGNOSIS — E118 Type 2 diabetes mellitus with unspecified complications: Principal | ICD-10-CM

## 2016-10-16 DIAGNOSIS — I10 Essential (primary) hypertension: Secondary | ICD-10-CM

## 2016-10-16 DIAGNOSIS — R000 Tachycardia, unspecified: Secondary | ICD-10-CM

## 2016-10-16 DIAGNOSIS — M549 Dorsalgia, unspecified: Secondary | ICD-10-CM

## 2016-10-16 DIAGNOSIS — E785 Hyperlipidemia, unspecified: Secondary | ICD-10-CM

## 2016-10-16 MED ORDER — LISINOPRIL 10 MG PO TABS: 10.0000 mg | ORAL_TABLET | Freq: Every day | ORAL | 1 refills | Status: DC

## 2016-10-16 MED ORDER — SITAGLIPTIN PHOSPHATE 100 MG PO TABS: 100.0000 mg | ORAL_TABLET | Freq: Every day | ORAL | 1 refills | Status: DC

## 2016-10-16 MED ORDER — METOPROLOL TARTRATE 25 MG PO TABS: 25.0000 mg | ORAL_TABLET | Freq: Two times a day (BID) | ORAL | 1 refills | Status: DC

## 2016-10-16 NOTE — Progress Notes (Signed)
BP 120/76 (BP Location: Left Arm, Patient Position: Sitting, Cuff Size: Normal)   Pulse 79   Temp 97.9 F (36.6 C)   Wt 168 lb 6.4 oz (76.4 kg)   SpO2 99%   BMI 28.91 kg/m    Subjective:    Patient ID: Kayla Berger, female    DOB: [birth year], 56 y.o.   MRN: [id number]  HPI: Kayla Berger is a 56 y.o. female presenting on 10/16/2016 for Follow-up   HPI  I am seeing pt at [facility] as interim provider until new permanent provider takes over mid-October   Pt says not using but 20u of insulin and not the 58 units that she is supposed to be taking.   She says she was underdosing herself due to weight gain.   Pt is scheduled for echo tomorrow.   Pt now has insurance through the end of this year.   fbs 283 this a.m.  She says her fbs have not been under 240 in 4 or 5 months.  She says she knows it is bad but she just "can't handle" the weight gain.  Pt is hoping to have back surgery in early October but needs medical clearance prior to the surgery.    Relevant past medical, surgical, family and social history reviewed and updated as indicated. Interim medical history since our last visit reviewed. Allergies and medications reviewed and updated.   Current Outpatient Prescriptions:  .  aspirin (BABY ASPIRIN) 81 MG chewable tablet, Chew 81 mg by mouth daily.  , Disp: , Rfl:  .  atorvastatin (LIPITOR) 20 MG tablet, Take 20 mg by mouth daily., Disp: , Rfl:  .  Biotin 5000 MCG CAPS, Take 1,000 mcg by mouth daily. , Disp: , Rfl:  .  cholecalciferol (VITAMIN D) 1000 units tablet, Take 2,000 Units by mouth daily., Disp: , Rfl:  .  cyclobenzaprine (FLEXERIL) 10 MG tablet, Take 1 tablet (10 mg total) by mouth 3 (three) times daily as needed for muscle spasms., Disp: 40 tablet, Rfl: 0 .  glipiZIDE (GLUCOTROL XL) 2.5 MG 24 hr tablet, Take 2.5 mg by mouth daily with breakfast., Disp: , Rfl:  .  Insulin Glargine (BASAGLAR KWIKPEN) 100 UNIT/ML SOPN, Inject 58 Units into the skin  daily., Disp: , Rfl:  .  lisinopril-hydrochlorothiazide (PRINZIDE,ZESTORETIC) 10-12.5 MG tablet, Take 1 tablet by mouth daily., Disp: , Rfl:  .  metFORMIN (GLUCOPHAGE) 500 MG tablet, TAKE 2 TABLETS BY MOUTH EVERY MORNING AND 3 TABLETS EVERY EVENING (Patient taking differently: TAKE 2 TABLETS BY BID), Disp: 150 tablet, Rfl: 2 .  traMADol (ULTRAM) 50 MG tablet, Take 50 mg by mouth at bedtime., Disp: , Rfl:    Review of Systems  Respiratory: Negative for shortness of breath.   Cardiovascular: Negative for chest pain.  Gastrointestinal: Negative for abdominal pain.  Musculoskeletal: Positive for back pain.    Per HPI unless specifically indicated above     Objective:    BP 120/76 (BP Location: Left Arm, Patient Position: Sitting, Cuff Size: Normal)   Pulse 79   Temp 97.9 F (36.6 C)   Wt 168 lb 6.4 oz (76.4 kg)   SpO2 99%   BMI 28.91 kg/m   Wt Readings from Last 3 Encounters:  10/16/16 168 lb 6.4 oz (76.4 kg)  10/09/16 166 lb 9.6 oz (75.6 kg)  12/05/15 181 lb 12.8 oz (82.5 kg)    Physical Exam  Constitutional: She is oriented to person, place, and time. She appears well-developed  and well-nourished.  HENT:  Head: Normocephalic and atraumatic.  Neck: Neck supple.  Cardiovascular: Regular rhythm.  Tachycardia present.   Pulmonary/Chest: Effort normal and breath sounds normal.  Abdominal: Soft. Bowel sounds are normal. She exhibits no mass. There is no hepatosplenomegaly. There is no tenderness.  Musculoskeletal: She exhibits no edema.  Lymphadenopathy:    She has no cervical adenopathy.  Neurological: She is alert and oriented to person, place, and time.  Skin: Skin is warm and dry.  Psychiatric: She has a normal mood and affect. Her behavior is normal.  Vitals reviewed.   Pulse 116  Labs 10/10/16-  a1c 14.4 Alk phos 199 CBC  wnL tsh 5.2     Assessment & Plan:    Encounter Diagnoses  Name Primary?  [name] Uncontrolled type 2 diabetes mellitus with complication,  unspecified whether long term insulin use ([place]) Yes  . Tachycardia   . Essential hypertension   . Back pain, unspecified back location, unspecified back pain laterality, unspecified chronicity   . Hyperlipidemia, unspecified hyperlipidemia type     -reviewed labs with pt -will discontinue glipizide.   -Start [name]. -pt to Monitor bs.  -pt to Continue insulin 20u qhs to start.  She is to Increase insulin by 2 u qhs until am fbs 130.  -will discontinue lisinopril/hctz and rx lisinopril 10 and metoprolol in efforts to control pulse -pt to get echo tomorrow as scheduled -pt to follow up next Wednesday (pt will be seen in only 1 week due to trying to get her ready to have her back surgery). -no treatment for thyroid at this time. Will monitor -pt states understanding with plan and is happy with current efforts to get her bs and pulse controlled.

## 2016-10-23 ENCOUNTER — Ambulatory Visit: Payer: Self-pay | Admitting: Physician Assistant

## 2016-10-23 ENCOUNTER — Encounter: Payer: Self-pay | Admitting: Physician Assistant

## 2016-10-23 VITALS — BP 112/70 | HR 67 | Temp 97.7°F | Wt 167.2 lb

## 2016-10-23 DIAGNOSIS — M549 Dorsalgia, unspecified: Secondary | ICD-10-CM

## 2016-10-23 DIAGNOSIS — E1165 Type 2 diabetes mellitus with hyperglycemia: Secondary | ICD-10-CM

## 2016-10-23 DIAGNOSIS — E785 Hyperlipidemia, unspecified: Secondary | ICD-10-CM

## 2016-10-23 DIAGNOSIS — I10 Essential (primary) hypertension: Secondary | ICD-10-CM

## 2016-10-23 DIAGNOSIS — E118 Type 2 diabetes mellitus with unspecified complications: Principal | ICD-10-CM

## 2016-10-23 NOTE — Progress Notes (Signed)
BP 112/70 (BP Location: Left Arm, Patient Position: Sitting, Cuff Size: Normal)   Pulse 67   Temp 97.7 F (36.5 C)   Wt 167 lb 3.2 oz (75.8 kg)   SpO2 98%   BMI 28.70 kg/m    Subjective:    Patient ID: Kayla Berger, female    DOB: 1960/08/01, 56 y.o.   MRN: [id number]  HPI: Kayla Berger is a 56 y.o. female presenting on 10/23/2016 for Follow-up   HPI   I am seeing pt at [facility] as interim provider until new permanent provider takes over mid-October  Pt is using 20 units of insulin nightly.  She is loving the [place]- bs log reviewed.  This morning fbs 82, 9/25- 133, 9/24- 121.   She is not feeling any different/not feeling bad since changing the bp meds.   She is still scheduled to have back surgery next week.  She has form to be faxed to surgeon for medical clearance.  Relevant past medical, surgical, family and social history reviewed and updated as indicated. Interim medical history since our last visit reviewed. Allergies and medications reviewed and updated.   Current Outpatient Prescriptions:  .  aspirin (BABY ASPIRIN) 81 MG chewable tablet, Chew 81 mg by mouth daily.  , Disp: , Rfl:  .  atorvastatin (LIPITOR) 20 MG tablet, Take 20 mg by mouth daily., Disp: , Rfl:  .  Biotin 5000 MCG CAPS, Take 1,000 mcg by mouth daily. , Disp: , Rfl:  .  cholecalciferol (VITAMIN D) 1000 units tablet, Take 2,000 Units by mouth daily., Disp: , Rfl:  .  cyclobenzaprine (FLEXERIL) 10 MG tablet, Take 1 tablet (10 mg total) by mouth 3 (three) times daily as needed for muscle spasms. (Patient taking differently: Take 10 mg by mouth at bedtime. ), Disp: 40 tablet, Rfl: 0 .  Insulin Glargine (BASAGLAR KWIKPEN) 100 UNIT/ML SOPN, Inject 58 Units into the skin daily., Disp: , Rfl:  .  lisinopril (PRINIVIL,ZESTRIL) 10 MG tablet, Take 1 tablet (10 mg total) by mouth daily., Disp: 30 tablet, Rfl: 1 .  metFORMIN (GLUCOPHAGE) 500 MG tablet, TAKE 2 TABLETS BY MOUTH EVERY MORNING AND 3  TABLETS EVERY EVENING (Patient taking differently: TAKE 2 TABLETS BID), Disp: 150 tablet, Rfl: 2 .  metoprolol tartrate (LOPRESSOR) 25 MG tablet, Take 1 tablet (25 mg total) by mouth 2 (two) times daily., Disp: 60 tablet, Rfl: 1 .  sitaGLIPtin (JANUVIA) 100 MG tablet, Take 1 tablet (100 mg total) by mouth daily., Disp: 30 tablet, Rfl: 1 .  traMADol (ULTRAM) 50 MG tablet, Take 50 mg by mouth at bedtime., Disp: , Rfl:   Review of Systems  Respiratory: Negative for shortness of breath.   Cardiovascular: Negative for chest pain.  Gastrointestinal: Negative for abdominal pain.  Musculoskeletal: Positive for back pain.    Per HPI unless specifically indicated above     Objective:    BP 112/70 (BP Location: Left Arm, Patient Position: Sitting, Cuff Size: Normal)   Pulse 67   Temp 97.7 F (36.5 C)   Wt 167 lb 3.2 oz (75.8 kg)   SpO2 98%   BMI 28.70 kg/m   Wt Readings from Last 3 Encounters:  10/23/16 167 lb 3.2 oz (75.8 kg)  10/16/16 168 lb 6.4 oz (76.4 kg)  10/09/16 166 lb 9.6 oz (75.6 kg)    Physical Exam  Constitutional: She is oriented to person, place, and time. She appears well-developed and well-nourished.  HENT:  Head: Normocephalic and  atraumatic.  Neck: Neck supple.  Cardiovascular: Normal rate and regular rhythm.   Pulmonary/Chest: Effort normal and breath sounds normal.  Abdominal: Soft. Bowel sounds are normal. She exhibits no mass. There is no hepatosplenomegaly. There is no tenderness.  Musculoskeletal: She exhibits no edema.  Lymphadenopathy:    She has no cervical adenopathy.  Neurological: She is alert and oriented to person, place, and time.  Skin: Skin is warm and dry.  Psychiatric: She has a normal mood and affect. Her behavior is normal.  Vitals reviewed.        Assessment & Plan:   Encounter Diagnoses  Name Primary?  [name] Uncontrolled type 2 diabetes mellitus with complication, unspecified whether long term insulin use (HCC) Yes  . Essential  hypertension   . Back pain, unspecified back location, unspecified back pain laterality, unspecified chronicity   . Hyperlipidemia, unspecified hyperlipidemia type     -Pt pulse well controlled with low dose metoprolol -reviewed results echo with pt- it was normal -bs looking great with metformin 1g bid, januvia and 20u insulin qhs -bp great with lisinopril and metoprolol -although it would be ideal for pt to have controlled bs for several months prior to her back surgery to reduce complications, will clear pt for back surgery next week due to good control at present time (bs, bp, pulse) and pt with very poor quality of life due to significant back pain -pt to follow up 3 months.  She will continue to monitor her bs and call office if > 300 or < 70 or if she has any other problems

## 2016-10-24 NOTE — Pre-Procedure Instructions (Signed)
TANEESHA EDGIN  10/24/2016   Your procedure is scheduled on Thursday, October 4.  Report to [facility] Admitting at 5:30 AM               Your surgery or procedure is scheduled for 7:30 AM   Call this number if you have problems the morning of surgery:  [phone]- pre- op desk                For any other questions, please call [phone], Monday - Friday 8 AM - 4 PM.    Remember:  Do not eat food or drink liquids after midnight Wednesday, October 3.  Take these medicines the morning of surgery with A SIP OF WATER:  metoprolol tartrate (LOPRESSOR).  Do NOT take pills for diabetes the morning of surgery.      May take if needed:  traMADol (ULTRAM) cyclobenzaprine (FLEXERIL)              STOP taking Aspirin, Aspirin Products (Goody Powder, Excedrin Migraine), Ibuprofen (Advil), Naproxen (Aleve), Vitamins and Herbal Products (ie Fish Oil)  How to Manage Your Diabetes Before and After Surgery Why is it important to control my blood sugar before and after surgery? . Improving blood sugar levels before and after surgery helps healing and can limit problems. . A way of improving blood sugar control is eating a healthy diet by: o  Eating less sugar and carbohydrates o  Increasing activity/exercise o  Talking with your doctor about reaching your blood sugar goals . High blood sugars (greater than 180 mg/dL) can raise your risk of infections and slow your recovery, so you will need to focus on controlling your diabetes during the weeks before surgery. . Make sure that the doctor who takes care of your diabetes knows about your planned surgery including the date and location.  How do I manage my blood sugar before surgery? . Check your blood sugar at least 4 times a day, starting 2 days before surgery, to make sure that the level is not too high or low. o Check your blood sugar the morning of your surgery when you wake up and every 2 hours until you get to the Short Stay  unit. . If your blood sugar is less than 70 mg/dL, you will need to treat for low blood sugar: o Do not take insulin. o Treat a low blood sugar (less than 70 mg/dL) with  cup of clear juice (cranberry or apple), 4 glucose tablets, OR glucose gel. o Recheck blood sugar in 15 minutes after treatment (to make sure it is greater than 70 mg/dL). If your blood sugar is not greater than 70 mg/dL on recheck, call [phone] for further instructions. . Report your blood sugar to the short stay nurse when you get to Short Stay. . If you are admitted to the hospital after surgery: o Your blood sugar will be checked by the staff and you will probably be given insulin after surgery (instead of oral diabetes medicines) to make sure you have good blood sugar levels. o The goal for blood sugar control after surgery is 80-180 mg/dL.  WHAT DO I DO ABOUT MY DIABETES MEDICATION?   [name] Do not take oral diabetes medicines (pills) the morning of surgery.  . THE NIGHT BEFORE SURGERY, take 10 units of _Insulin Glargine (BASAGLAR KWIKPEN) insulin.      . The day of surgery, do not take other diabetes injectables, including Byetta (exenatide), Bydureon (exenatide  ER), Victoza (liraglutide), or Trulicity (dulaglutide). Special instructions:   [facility]- Preparing For Surgery  Before surgery, you can play an important role. Because skin is not sterile, your skin needs to be as free of germs as possible. You can reduce the number of germs on your skin by washing with CHG (chlorahexidine gluconate) Soap before surgery.  CHG is an antiseptic cleaner which kills germs and bonds with the skin to continue killing germs even after washing.  Please do not use if you have an allergy to CHG or antibacterial soaps. If your skin becomes reddened/irritated stop using the CHG.  Do not shave (including legs and underarms) for at least 48 hours prior to first CHG shower. It is OK to shave your face.  Please follow these  instructions carefully.   1. Shower the NIGHT BEFORE SURGERY and the MORNING OF SURGERY with CHG.   2. If you chose to wash your hair, wash your hair first as usual with your normal shampoo.  3. After you shampoo, rinse your hair and body thoroughly to remove the shampoo.    Wash your face and private area with the soap you use at home, then rinse.  4. Use CHG as you would any other liquid soap. You can apply CHG directly to the skin and wash gently with a scrungie or a clean washcloth.   5. Apply the CHG Soap to your body ONLY FROM THE NECK DOWN.  Do not use on open wounds or open sores. Avoid contact with your eyes, ears, mouth and genitals (private parts). Wash genitals (private parts) with your normal soap.  6. Wash thoroughly, paying special attention to the area where your surgery will be performed.  7. Thoroughly rinse your body with warm water from the neck down.  8. DO NOT shower/wash with your normal soap after using and rinsing off the CHG Soap.  9. Pat yourself dry with a CLEAN TOWEL.   10. Wear CLEAN PAJAMAS   11. Place CLEAN SHEETS on your bed the night of your first shower and DO NOT SLEEP WITH PETS.  Day of Surgery: Shower as above Do not apply any deodorants/lotions, powders or colognes. Please wear clean clothes to the hospital/surgery center.               Do not wear jewelry, make-up or nail polish.  Do not shave 48 hours prior to surgery.  Men may shave face and neck.  Do not bring valuables to the hospital.  [facility] is not responsible for any belongings or valuables.  Contacts, dentures or bridgework may not be worn into surgery.  Leave your suitcase in the car.  After surgery it may be brought to your room.  For patients admitted to the hospital, discharge time will be determined by your treatment team.  Patients discharged the day of surgery will not be allowed to drive home.   Please read over the following fact sheets that you were given: Pain  Booklet, Coughing and Deep Breathing, Surgical Site Infections.  Patient Signature:  Date:   Nurse Signature:  Date:

## 2016-10-25 ENCOUNTER — Encounter (HOSPITAL_COMMUNITY): Payer: Self-pay | Admitting: *Deleted

## 2016-10-25 ENCOUNTER — Encounter (HOSPITAL_COMMUNITY)
Admission: RE | Admit: 2016-10-25 | Discharge: 2016-10-25 | Disposition: A | Discharge status: Home / Self Care | Payer: BLUE CROSS/BLUE SHIELD | Source: Ambulatory Visit | Attending: Orthopedic Surgery | Admitting: Orthopedic Surgery

## 2016-10-25 DIAGNOSIS — Z01818 Encounter for other preprocedural examination: Secondary | ICD-10-CM | POA: Insufficient documentation

## 2016-10-25 DIAGNOSIS — E119 Type 2 diabetes mellitus without complications: Secondary | ICD-10-CM | POA: Insufficient documentation

## 2016-10-25 DIAGNOSIS — I10 Essential (primary) hypertension: Secondary | ICD-10-CM | POA: Insufficient documentation

## 2016-10-25 HISTORY — DX: Other complications of anesthesia, initial encounter: T88.59XA

## 2016-10-25 HISTORY — DX: Hypothyroidism, unspecified: E03.9

## 2016-10-25 HISTORY — DX: Unspecified osteoarthritis, unspecified site: M19.90

## 2016-10-25 HISTORY — DX: Adverse effect of unspecified anesthetic, initial encounter: T41.45XA

## 2016-10-25 HISTORY — DX: Personal history of urinary calculi: Z87.442

## 2016-10-25 HISTORY — DX: Cardiac arrhythmia, unspecified: I49.9

## 2016-10-25 LAB — CBC
HCT: 37.4 % (ref 36.0–46.0)
Hemoglobin: 12.8 g/dL (ref 12.0–15.0)
MCH: 29.9 pg (ref 26.0–34.0)
MCHC: 34.2 g/dL (ref 30.0–36.0)
MCV: 87.4 fL (ref 78.0–100.0)
PLATELETS: 211 10*3/uL (ref 150–400)
RBC: 4.28 MIL/uL (ref 3.87–5.11)
RDW: 12.8 % (ref 11.5–15.5)
WBC: 8.1 10*3/uL (ref 4.0–10.5)

## 2016-10-25 LAB — BASIC METABOLIC PANEL
Anion gap: 7 (ref 5–15)
BUN: 8 mg/dL (ref 6–20)
CALCIUM: 8.7 mg/dL — AB (ref 8.9–10.3)
CO2: 28 mmol/L (ref 22–32)
CREATININE: 0.65 mg/dL (ref 0.44–1.00)
Chloride: 106 mmol/L (ref 101–111)
Glucose, Bld: 223 mg/dL — ABNORMAL HIGH (ref 65–99)
Potassium: 4 mmol/L (ref 3.5–5.1)
SODIUM: 141 mmol/L (ref 135–145)

## 2016-10-25 LAB — GLUCOSE, CAPILLARY: GLUCOSE-CAPILLARY: 214 mg/dL — AB (ref 65–99)

## 2016-10-25 LAB — HEMOGLOBIN A1C
HEMOGLOBIN A1C: 13.1 % — AB (ref 4.8–5.6)
MEAN PLASMA GLUCOSE: 329.27 mg/dL

## 2016-10-25 NOTE — Pre-Procedure Instructions (Addendum)
Kayla Berger  10/25/2016   Your procedure is scheduled on Thursday, October 4.  Report to [facility] Admitting at 5:30 AM               Your surgery or procedure is scheduled for 7:30 AM   Call this number if you have problems the morning of surgery:  [phone]- pre- op desk                For any other questions, please call [phone], Monday - Friday 8 AM - 4 PM.    Remember:  Do not eat food or drink liquids after midnight Wednesday, October 3.  Take these medicines the morning of surgery with A SIP OF WATER:  metoprolol tartrate (LOPRESSOR).   Do NOT take pills for diabetes the morning of surgery. (januvia,metformin)      May take if needed:  traMADol (ULTRAM) cyclobenzaprine (FLEXERIL)               STOP taking Aspirin, Aspirin Products (Goody Powder, Excedrin Migraine), Ibuprofen (Advil), Naproxen (Aleve), all Vitamins and all Herbal Products (ie Fish Oil,biotin)  How to Manage Your Diabetes Before and After Surgery  Why is it important to control my blood sugar before and after surgery? . Improving blood sugar levels before and after surgery helps healing and can limit problems. . A way of improving blood sugar control is eating a healthy diet by: o  Eating less sugar and carbohydrates o  Increasing activity/exercise o  Talking with your doctor about reaching your blood sugar goals . High blood sugars (greater than 180 mg/dL) can raise your risk of infections and slow your recovery, so you will need to focus on controlling your diabetes during the weeks before surgery. . Make sure that the doctor who takes care of your diabetes knows about your planned surgery including the date and location.  How do I manage my blood sugar before surgery? . Check your blood sugar at least 4 times a day, starting 2 days before surgery, to make sure that the level is not too high or low. o Check your blood sugar the morning of your surgery when you wake up and every  2 hours until you get to the Short Stay unit. . If your blood sugar is less than 70 mg/dL, you will need to treat for low blood sugar: o Do not take insulin. o Treat a low blood sugar (less than 70 mg/dL) with  cup of clear juice (cranberry or apple), 4 glucose tablets, OR glucose gel. o Recheck blood sugar in 15 minutes after treatment (to make sure it is greater than 70 mg/dL). If your blood sugar is not greater than 70 mg/dL on recheck, call [phone] for further instructions. . Report your blood sugar to the short stay nurse when you get to Short Stay. . If you are admitted to the hospital after surgery: o Your blood sugar will be checked by the staff and you will probably be given insulin after surgery (instead of oral diabetes medicines) to make sure you have good blood sugar levels. o The goal for blood sugar control after surgery is 80-180 mg/dL.  WHAT DO I DO ABOUT MY DIABETES MEDICATION?   [name] Do not take oral diabetes medicines (pills) the morning of surgery.  . THE NIGHT BEFORE SURGERY, take 10 units of _Insulin Glargine (BASAGLAR KWIKPEN) insulin.      Special instructions:   [place]- Preparing For Surgery  Before surgery, you can play an important role. Because skin is not sterile, your skin needs to be as free of germs as possible. You can reduce the number of germs on your skin by washing with CHG (chlorahexidine gluconate) Soap before surgery.  CHG is an antiseptic cleaner which kills germs and bonds with the skin to continue killing germs even after washing.  Please do not use if you have an allergy to CHG or antibacterial soaps. If your skin becomes reddened/irritated stop using the CHG.  Do not shave (including legs and underarms) for at least 48 hours prior to first CHG shower. It is OK to shave your face.  Please follow these instructions carefully.   1. Shower the NIGHT BEFORE SURGERY and the MORNING OF SURGERY with CHG.   2. If you chose to wash your hair,  wash your hair first as usual with your normal shampoo.  3. After you shampoo, rinse your hair and body thoroughly to remove the shampoo.    Wash your face and private area with the soap you use at home, then rinse.  4. Use CHG as you would any other liquid soap. You can apply CHG directly to the skin and wash gently with a scrungie or a clean washcloth.   5. Apply the CHG Soap to your body ONLY FROM THE NECK DOWN.  Do not use on open wounds or open sores. Avoid contact with your eyes, ears, mouth and genitals (private parts). Wash genitals (private parts) with your normal soap.  6. Wash thoroughly, paying special attention to the area where your surgery will be performed.  7. Thoroughly rinse your body with warm water from the neck down.  8. DO NOT shower/wash with your normal soap after using and rinsing off the CHG Soap.  9. Pat yourself dry with a CLEAN TOWEL.   10. Wear CLEAN PAJAMAS   11. Place CLEAN SHEETS on your bed the night of your first shower and DO NOT SLEEP WITH PETS.  Day of Surgery: Shower as above Do not apply any deodorants/lotions, powders or colognes. Please wear clean clothes to the hospital/surgery center.               Do not wear jewelry, make-up or nail polish.  Do not shave 48 hours prior to surgery.  Men may shave face and neck.  Do not bring valuables to the hospital.  [facility] is not responsible for any belongings or valuables.  Contacts, dentures or bridgework may not be worn into surgery.  Leave your suitcase in the car.  After surgery it may be brought to your room.  For patients admitted to the hospital, discharge time will be determined by your treatment team.  Patients discharged the day of surgery will not be allowed to drive home.   Please read over the  fact sheets that you were given:

## 2016-10-28 NOTE — Progress Notes (Addendum)
Anesthesia Chart Review:  Pt is a 56 year old female scheduled for L1, L2, L3 kyphoplasty on 10/31/2016 with Venita Lick, MD  - PCP is Jacquelin Hawking, PA who has cleared pt for surgery.   PMH includes:  HTN, irregular heart beat, DM, hypothyroidism. GERD. Never smoker. BMI 29  Medications include: lipitor, basaglar kwikpen, lisinopril, metformin, metoprolol, sitagliptin  Preoperative labs reviewed.  HbA1c 13.1, glucose 223  EKG 10/25/16: NSR.   Echo 10/17/16 [facility]):  - LV chamber size normal. Global LV wall motion and contractility are within normal limits. Estimated EF 55-60%. Normal LV diastolic filling is observed.  I left voicemail for [facility] in Dr. Shon Baton' office about uncontrolled DM.   If glucose acceptable DOS, I anticipate pt can proceed as scheduled.   Rica Mast, FNP-BC [facility]/Anesthesiology Phone: [phone] 10/30/2016 4:18 PM

## 2016-10-30 ENCOUNTER — Encounter (HOSPITAL_COMMUNITY): Payer: Self-pay | Admitting: Anesthesiology

## 2016-10-30 NOTE — Anesthesia Preprocedure Evaluation (Addendum)
Anesthesia Evaluation  Patient identified by MRN, date of birth, ID band Patient awake    Reviewed: Allergy & Precautions, NPO status , Patient's Chart, lab work & pertinent test results  Airway Mallampati: I       Dental no notable dental hx. (+) Teeth Intact   Pulmonary neg pulmonary ROS,    Pulmonary exam normal        Cardiovascular hypertension, Pt. on medications and Pt. on home beta blockers Normal cardiovascular exam Rhythm:Regular Rate:Normal     Neuro/Psych negative psych ROS   GI/Hepatic negative GI ROS, Neg liver ROS,   Endo/Other  diabetes, Type 2, Oral Hypoglycemic Agents, Insulin DependentHypothyroidism   Renal/GU negative Renal ROS  negative genitourinary   Musculoskeletal   Abdominal Normal abdominal exam  (+)   Peds  Hematology negative hematology ROS (+)   Anesthesia Other Findings   Reproductive/Obstetrics                            Anesthesia Physical Anesthesia Plan  ASA: II  Anesthesia Plan: MAC   Post-op Pain Management:    Induction: Intravenous  PONV Risk Score and Plan: 4 or greater and Ondansetron, Dexamethasone, Midazolam and Scopolamine patch - Pre-op  Airway Management Planned: Natural Airway and Nasal Cannula  Additional Equipment:   Intra-op Plan:   Post-operative Plan: Extubation in OR  Informed Consent: I have reviewed the patients History and Physical, chart, labs and discussed the procedure including the risks, benefits and alternatives for the proposed anesthesia with the patient or authorized representative who has indicated his/her understanding and acceptance.   Dental advisory given  Plan Discussed with: CRNA and Surgeon  Anesthesia Plan Comments: (Patient is prone. MAC per Dr. Rolena Infante.)      Anesthesia Quick Evaluation

## 2016-10-31 ENCOUNTER — Encounter (HOSPITAL_COMMUNITY): Payer: Self-pay | Admitting: *Deleted

## 2016-10-31 ENCOUNTER — Ambulatory Visit (HOSPITAL_COMMUNITY): Payer: BLUE CROSS/BLUE SHIELD | Admitting: Anesthesiology

## 2016-10-31 ENCOUNTER — Ambulatory Visit (HOSPITAL_COMMUNITY): Payer: BLUE CROSS/BLUE SHIELD

## 2016-10-31 ENCOUNTER — Ambulatory Visit (HOSPITAL_COMMUNITY): Payer: BLUE CROSS/BLUE SHIELD | Admitting: Emergency Medicine

## 2016-10-31 ENCOUNTER — Encounter (HOSPITAL_COMMUNITY)
Admission: RE | Disposition: A | Discharge status: Home / Self Care | Payer: Self-pay | Source: Ambulatory Visit | Attending: Orthopedic Surgery

## 2016-10-31 ENCOUNTER — Observation Stay (HOSPITAL_COMMUNITY)
Admission: RE | Admit: 2016-10-31 | Discharge: 2016-11-01 | Disposition: A | Discharge status: Home / Self Care | Payer: BLUE CROSS/BLUE SHIELD | Source: Ambulatory Visit | Attending: Orthopedic Surgery | Admitting: Orthopedic Surgery

## 2016-10-31 DIAGNOSIS — Z79899 Other long term (current) drug therapy: Secondary | ICD-10-CM | POA: Insufficient documentation

## 2016-10-31 DIAGNOSIS — I10 Essential (primary) hypertension: Secondary | ICD-10-CM | POA: Insufficient documentation

## 2016-10-31 DIAGNOSIS — M1990 Unspecified osteoarthritis, unspecified site: Secondary | ICD-10-CM | POA: Diagnosis not present

## 2016-10-31 DIAGNOSIS — Z7982 Long term (current) use of aspirin: Secondary | ICD-10-CM | POA: Diagnosis not present

## 2016-10-31 DIAGNOSIS — E119 Type 2 diabetes mellitus without complications: Secondary | ICD-10-CM | POA: Insufficient documentation

## 2016-10-31 DIAGNOSIS — Z794 Long term (current) use of insulin: Secondary | ICD-10-CM | POA: Diagnosis not present

## 2016-10-31 DIAGNOSIS — E282 Polycystic ovarian syndrome: Secondary | ICD-10-CM | POA: Diagnosis not present

## 2016-10-31 DIAGNOSIS — Z87442 Personal history of urinary calculi: Secondary | ICD-10-CM | POA: Diagnosis not present

## 2016-10-31 DIAGNOSIS — S32020A Wedge compression fracture of second lumbar vertebra, initial encounter for closed fracture: Secondary | ICD-10-CM | POA: Diagnosis present

## 2016-10-31 DIAGNOSIS — E039 Hypothyroidism, unspecified: Secondary | ICD-10-CM | POA: Diagnosis not present

## 2016-10-31 DIAGNOSIS — Z419 Encounter for procedure for purposes other than remedying health state, unspecified: Secondary | ICD-10-CM

## 2016-10-31 DIAGNOSIS — M4856XA Collapsed vertebra, not elsewhere classified, lumbar region, initial encounter for fracture: Secondary | ICD-10-CM | POA: Diagnosis not present

## 2016-10-31 HISTORY — PX: KYPHOPLASTY: SHX5884

## 2016-10-31 LAB — GLUCOSE, CAPILLARY
GLUCOSE-CAPILLARY: 105 mg/dL — AB (ref 65–99)
GLUCOSE-CAPILLARY: 101 mg/dL — AB (ref 65–99)
Glucose-Capillary: 156 mg/dL — ABNORMAL HIGH (ref 65–99)
Glucose-Capillary: 120 mg/dL — ABNORMAL HIGH (ref 65–99)
Glucose-Capillary: 136 mg/dL — ABNORMAL HIGH (ref 65–99)

## 2016-10-31 LAB — HEMOGLOBIN A1C
HEMOGLOBIN A1C: 12.3 % — AB (ref 4.8–5.6)
MEAN PLASMA GLUCOSE: 306.31 mg/dL

## 2016-10-31 SURGERY — KYPHOPLASTY
Anesthesia: General | Site: Back

## 2016-10-31 MED ORDER — MORPHINE SULFATE (PF) 4 MG/ML IV SOLN: 2.0000 mg | INTRAVENOUS | Status: DC | PRN

## 2016-10-31 MED ORDER — MENTHOL 3 MG MT LOZG: 1.0000 | LOZENGE | OROMUCOSAL | Status: DC | PRN

## 2016-10-31 MED ORDER — LIDOCAINE 2% (20 MG/ML) 5 ML SYRINGE
INTRAMUSCULAR | Status: AC
  Filled 2016-10-31: qty 5

## 2016-10-31 MED ORDER — FENTANYL CITRATE (PF) 250 MCG/5ML IJ SOLN
INTRAMUSCULAR | Status: AC
  Filled 2016-10-31: qty 5

## 2016-10-31 MED ORDER — FENTANYL CITRATE (PF) 100 MCG/2ML IJ SOLN
INTRAMUSCULAR | Status: AC
  Filled 2016-10-31: qty 2

## 2016-10-31 MED ORDER — BUPIVACAINE-EPINEPHRINE (PF) 0.25% -1:200000 IJ SOLN
INTRAMUSCULAR | Status: AC
  Filled 2016-10-31: qty 30

## 2016-10-31 MED ORDER — METHOCARBAMOL 1000 MG/10ML IJ SOLN
500.0000 mg | Freq: Four times a day (QID) | INTRAVENOUS | Status: DC | PRN
  Filled 2016-10-31: qty 5

## 2016-10-31 MED ORDER — BUPIVACAINE-EPINEPHRINE 0.25% -1:200000 IJ SOLN
INTRAMUSCULAR | Status: DC | PRN
  Administered 2016-10-31: 30 mL

## 2016-10-31 MED ORDER — IOPAMIDOL (ISOVUE-300) INJECTION 61%
INTRAVENOUS | Status: AC
  Filled 2016-10-31: qty 50

## 2016-10-31 MED ORDER — ONDANSETRON HCL 4 MG/2ML IJ SOLN
4.0000 mg | Freq: Four times a day (QID) | INTRAMUSCULAR | Status: DC | PRN
Start: 2016-10-31 — End: 2016-11-01
  Administered 2016-10-31: 4 mg via INTRAVENOUS
  Filled 2016-10-31: qty 2

## 2016-10-31 MED ORDER — ONDANSETRON HCL 4 MG/2ML IJ SOLN
INTRAMUSCULAR | Status: DC | PRN
Start: 2016-10-31 — End: 2016-10-31
  Administered 2016-10-31: 4 mg via INTRAVENOUS

## 2016-10-31 MED ORDER — BUPIVACAINE LIPOSOME 1.3 % IJ SUSP
20.0000 mL | INTRAMUSCULAR | Status: DC
Start: 2016-10-31 — End: 2016-10-31
  Filled 2016-10-31: qty 20

## 2016-10-31 MED ORDER — IOPAMIDOL (ISOVUE-300) INJECTION 61%
INTRAVENOUS | Status: DC | PRN
  Administered 2016-10-31: 50 mL
  Administered 2016-10-31: 50 mL via INTRAVENOUS

## 2016-10-31 MED ORDER — METOPROLOL TARTRATE 25 MG PO TABS
25.0000 mg | ORAL_TABLET | Freq: Two times a day (BID) | ORAL | Status: DC
  Administered 2016-10-31 – 2016-11-01 (×3): 25 mg via ORAL
  Filled 2016-10-31 (×3): qty 1

## 2016-10-31 MED ORDER — MIDAZOLAM HCL 5 MG/5ML IJ SOLN
INTRAMUSCULAR | Status: DC | PRN
Start: 2016-10-31 — End: 2016-10-31
  Administered 2016-10-31: 2 mg via INTRAVENOUS

## 2016-10-31 MED ORDER — PHENOL 1.4 % MT LIQD: 1.0000 | OROMUCOSAL | Status: DC | PRN

## 2016-10-31 MED ORDER — METHOCARBAMOL 500 MG PO TABS
ORAL_TABLET | ORAL | Status: AC
  Administered 2016-10-31: 500 mg via ORAL
  Filled 2016-10-31: qty 1

## 2016-10-31 MED ORDER — OXYCODONE HCL 5 MG PO TABS
ORAL_TABLET | ORAL | Status: AC
  Administered 2016-10-31: 10 mg via ORAL
  Filled 2016-10-31: qty 2

## 2016-10-31 MED ORDER — ONDANSETRON HCL 4 MG/2ML IJ SOLN
INTRAMUSCULAR | Status: AC
  Filled 2016-10-31: qty 2

## 2016-10-31 MED ORDER — LACTATED RINGERS IV SOLN
INTRAVENOUS | Status: DC | PRN
  Administered 2016-10-31: 07:00:00 via INTRAVENOUS

## 2016-10-31 MED ORDER — MIDAZOLAM HCL 2 MG/2ML IJ SOLN
INTRAMUSCULAR | Status: AC
  Filled 2016-10-31: qty 2

## 2016-10-31 MED ORDER — ONDANSETRON HCL 4 MG PO TABS: 4.0000 mg | ORAL_TABLET | Freq: Three times a day (TID) | ORAL | 0 refills | Status: DC | PRN

## 2016-10-31 MED ORDER — LACTATED RINGERS IV SOLN: INTRAVENOUS | Status: DC

## 2016-10-31 MED ORDER — 0.9 % SODIUM CHLORIDE (POUR BTL) OPTIME
TOPICAL | Status: DC | PRN
  Administered 2016-10-31: 1000 mL

## 2016-10-31 MED ORDER — KETOROLAC TROMETHAMINE 30 MG/ML IJ SOLN: 30.0000 mg | Freq: Once | INTRAMUSCULAR | Status: DC | PRN

## 2016-10-31 MED ORDER — OXYCODONE-ACETAMINOPHEN 10-325 MG PO TABS: 1.0000 | ORAL_TABLET | ORAL | 0 refills | Status: DC | PRN

## 2016-10-31 MED ORDER — INSULIN ASPART 100 UNIT/ML ~~LOC~~ SOLN
0.0000 [IU] | Freq: Three times a day (TID) | SUBCUTANEOUS | Status: DC
  Administered 2016-11-01: 2 [IU] via SUBCUTANEOUS

## 2016-10-31 MED ORDER — OXYCODONE HCL 5 MG PO TABS
10.0000 mg | ORAL_TABLET | ORAL | Status: DC | PRN
  Administered 2016-10-31 – 2016-11-01 (×6): 10 mg via ORAL
  Filled 2016-10-31 (×5): qty 2

## 2016-10-31 MED ORDER — ONDANSETRON HCL 4 MG PO TABS: 4.0000 mg | ORAL_TABLET | Freq: Four times a day (QID) | ORAL | Status: DC | PRN

## 2016-10-31 MED ORDER — METFORMIN HCL 500 MG PO TABS
1000.0000 mg | ORAL_TABLET | Freq: Two times a day (BID) | ORAL | Status: DC
  Administered 2016-10-31 – 2016-11-01 (×2): 1000 mg via ORAL
  Filled 2016-10-31 (×2): qty 2

## 2016-10-31 MED ORDER — INSULIN ASPART 100 UNIT/ML ~~LOC~~ SOLN
0.0000 [IU] | Freq: Every day | SUBCUTANEOUS | Status: DC
  Administered 2016-10-31: 2 [IU] via SUBCUTANEOUS

## 2016-10-31 MED ORDER — DEXMEDETOMIDINE HCL IN NACL 200 MCG/50ML IV SOLN
INTRAVENOUS | Status: DC | PRN
  Administered 2016-10-31: 0.4 ug/kg/h via INTRAVENOUS

## 2016-10-31 MED ORDER — PROPOFOL 10 MG/ML IV BOLUS
INTRAVENOUS | Status: AC
  Filled 2016-10-31: qty 20

## 2016-10-31 MED ORDER — BUPIVACAINE LIPOSOME 1.3 % IJ SUSP
INTRAMUSCULAR | Status: DC | PRN
  Administered 2016-10-31: 20 mL

## 2016-10-31 MED ORDER — BASAGLAR KWIKPEN 100 UNIT/ML ~~LOC~~ SOPN
20.0000 [IU] | PEN_INJECTOR | Freq: Every day | SUBCUTANEOUS | Status: DC
  Filled 2016-10-31: qty 3

## 2016-10-31 MED ORDER — CEFAZOLIN SODIUM-DEXTROSE 1-4 GM/50ML-% IV SOLN
1.0000 g | Freq: Three times a day (TID) | INTRAVENOUS | Status: AC
  Administered 2016-10-31 (×2): 1 g via INTRAVENOUS
  Filled 2016-10-31 (×2): qty 50

## 2016-10-31 MED ORDER — CEFAZOLIN SODIUM-DEXTROSE 2-4 GM/100ML-% IV SOLN
2.0000 g | INTRAVENOUS | Status: AC
  Administered 2016-10-31: 2 g via INTRAVENOUS
  Filled 2016-10-31: qty 100

## 2016-10-31 MED ORDER — SODIUM CHLORIDE 0.9% FLUSH: 3.0000 mL | Freq: Two times a day (BID) | INTRAVENOUS | Status: DC

## 2016-10-31 MED ORDER — PROMETHAZINE HCL 25 MG/ML IJ SOLN: 6.2500 mg | INTRAMUSCULAR | Status: DC | PRN

## 2016-10-31 MED ORDER — LINAGLIPTIN 5 MG PO TABS
5.0000 mg | ORAL_TABLET | Freq: Every day | ORAL | Status: DC
  Administered 2016-11-01: 5 mg via ORAL
  Filled 2016-10-31 (×2): qty 1

## 2016-10-31 MED ORDER — METHOCARBAMOL 500 MG PO TABS
500.0000 mg | ORAL_TABLET | Freq: Four times a day (QID) | ORAL | Status: DC | PRN
Start: 2016-10-31 — End: 2016-11-01
  Administered 2016-10-31 – 2016-11-01 (×5): 500 mg via ORAL
  Filled 2016-10-31 (×4): qty 1

## 2016-10-31 MED ORDER — FENTANYL CITRATE (PF) 100 MCG/2ML IJ SOLN
INTRAMUSCULAR | Status: DC | PRN
  Administered 2016-10-31 (×4): 25 ug via INTRAVENOUS
  Administered 2016-10-31: 50 ug via INTRAVENOUS
  Administered 2016-10-31 (×2): 25 ug via INTRAVENOUS
  Administered 2016-10-31: 50 ug via INTRAVENOUS
  Administered 2016-10-31 (×2): 25 ug via INTRAVENOUS

## 2016-10-31 MED ORDER — LISINOPRIL 10 MG PO TABS
10.0000 mg | ORAL_TABLET | Freq: Every day | ORAL | Status: DC
  Administered 2016-11-01: 10 mg via ORAL
  Filled 2016-10-31 (×2): qty 1

## 2016-10-31 MED ORDER — BUPIVACAINE HCL (PF) 0.25 % IJ SOLN
INTRAMUSCULAR | Status: AC
  Filled 2016-10-31: qty 30

## 2016-10-31 MED ORDER — FENTANYL CITRATE (PF) 100 MCG/2ML IJ SOLN
25.0000 ug | INTRAMUSCULAR | Status: DC | PRN
  Administered 2016-10-31: 50 ug via INTRAVENOUS

## 2016-10-31 MED ORDER — SODIUM CHLORIDE 0.9% FLUSH: 3.0000 mL | INTRAVENOUS | Status: DC | PRN

## 2016-10-31 MED ORDER — INSULIN GLARGINE 100 UNIT/ML ~~LOC~~ SOLN
20.0000 [IU] | Freq: Every day | SUBCUTANEOUS | Status: DC
  Administered 2016-10-31: 20 [IU] via SUBCUTANEOUS
  Filled 2016-10-31 (×2): qty 0.2

## 2016-10-31 MED ORDER — SODIUM CHLORIDE 0.9 % IV SOLN: 250.0000 mL | INTRAVENOUS | Status: DC

## 2016-10-31 MED ORDER — MEPERIDINE HCL 25 MG/ML IJ SOLN: 6.2500 mg | INTRAMUSCULAR | Status: DC | PRN

## 2016-10-31 SURGICAL SUPPLY — 58 items
APL SKNCLS STERI-STRIP NONHPOA (GAUZE/BANDAGES/DRESSINGS) ×1
BANDAGE ADH SHEER 1  50/CT (GAUZE/BANDAGES/DRESSINGS) ×2 IMPLANT
BENZOIN TINCTURE PRP APPL 2/3 (GAUZE/BANDAGES/DRESSINGS) ×2 IMPLANT
BLADE SURG 15 STRL LF DISP TIS (BLADE) ×1 IMPLANT
BLADE SURG 15 STRL SS (BLADE) ×2
BONE FILLER DEVICE STRL SZ3 (INSTRUMENTS) IMPLANT
CEMENT KYPHON CX01A KIT/MIXER (Cement) ×6 IMPLANT
COVER LIGHT HANDLE  DEROYL (MISCELLANEOUS) ×2 IMPLANT
COVER MAYO STAND STRL (DRAPES) ×2 IMPLANT
CURETTE WEDGE 8.5MM KYPHX (MISCELLANEOUS) IMPLANT
DERMABOND ADVANCED (GAUZE/BANDAGES/DRESSINGS) ×1
DERMABOND ADVANCED .7 DNX12 (GAUZE/BANDAGES/DRESSINGS) ×1 IMPLANT
DRAPE C-ARM 42X72 X-RAY (DRAPES) ×4 IMPLANT
DRAPE HALF SHEET 40X57 (DRAPES) ×4 IMPLANT
DRAPE INCISE IOBAN 66X45 STRL (DRAPES) ×2 IMPLANT
DRAPE LAPAROTOMY T 102X78X121 (DRAPES) ×2 IMPLANT
DRAPE SURG 17X23 STRL (DRAPES) ×2 IMPLANT
DRAPE U-SHAPE 47X51 STRL (DRAPES) ×2 IMPLANT
DRSG AQUACEL AG ADV 3.5X10 (GAUZE/BANDAGES/DRESSINGS) IMPLANT
DURAPREP 26ML APPLICATOR (WOUND CARE) ×2 IMPLANT
ELECT PENCIL ROCKER SW 15FT (MISCELLANEOUS) ×2 IMPLANT
ELECT REM PT RETURN 9FT ADLT (ELECTROSURGICAL) ×2
ELECTRODE REM PT RTRN 9FT ADLT (ELECTROSURGICAL) ×1 IMPLANT
GAUZE SPONGE 4X4 16PLY XRAY LF (GAUZE/BANDAGES/DRESSINGS) ×2 IMPLANT
GLOVE BIO SURGEON STRL SZ 6.5 (GLOVE) IMPLANT
GLOVE BIOGEL PI IND STRL 6 (GLOVE) ×1 IMPLANT
GLOVE BIOGEL PI IND STRL 6.5 (GLOVE) IMPLANT
GLOVE BIOGEL PI IND STRL 8.5 (GLOVE) ×1 IMPLANT
GLOVE BIOGEL PI INDICATOR 6 (GLOVE) ×1
GLOVE BIOGEL PI INDICATOR 6.5 (GLOVE)
GLOVE BIOGEL PI INDICATOR 8.5 (GLOVE) ×1
GLOVE SS BIOGEL STRL SZ 8.5 (GLOVE) ×2 IMPLANT
GLOVE SUPERSENSE BIOGEL SZ 8.5 (GLOVE) ×2
GLOVE SURG SS PI 6.0 STRL IVOR (GLOVE) ×2 IMPLANT
GLOVE SURG SS PI 8.0 STRL IVOR (GLOVE) ×2 IMPLANT
GOWN STRL REUS W/ TWL LRG LVL3 (GOWN DISPOSABLE) ×2 IMPLANT
GOWN STRL REUS W/TWL 2XL LVL3 (GOWN DISPOSABLE) ×2 IMPLANT
GOWN STRL REUS W/TWL LRG LVL3 (GOWN DISPOSABLE) ×2
KIT BASIN OR (CUSTOM PROCEDURE TRAY) ×2 IMPLANT
KIT ROOM TURNOVER OR (KITS) ×2 IMPLANT
MARKER SKIN DUAL TIP RULER LAB (MISCELLANEOUS) ×2 IMPLANT
NEEDLE 22X1 1/2 (OR ONLY) (NEEDLE) ×2 IMPLANT
NEEDLE HYPO 25X1 1.5 SAFETY (NEEDLE) ×2 IMPLANT
NEEDLE SPNL 18GX3.5 QUINCKE PK (NEEDLE) ×4 IMPLANT
NS IRRIG 1000ML POUR BTL (IV SOLUTION) ×2 IMPLANT
PACK SURGICAL SETUP 50X90 (CUSTOM PROCEDURE TRAY) ×2 IMPLANT
PACK UNIVERSAL I (CUSTOM PROCEDURE TRAY) ×2 IMPLANT
PAD ARMBOARD 7.5X6 YLW CONV (MISCELLANEOUS) ×4 IMPLANT
SPONGE LAP 18X18 X RAY DECT (DISPOSABLE) ×2 IMPLANT
SURGIFLO W/THROMBIN 8M KIT (HEMOSTASIS) IMPLANT
SUT BONE WAX W31G (SUTURE) ×2 IMPLANT
SUT MNCRL AB 3-0 PS2 18 (SUTURE) ×4 IMPLANT
SYR CONTROL 10ML LL (SYRINGE) ×2 IMPLANT
TOWEL OR 17X24 6PK STRL BLUE (TOWEL DISPOSABLE) ×4 IMPLANT
TOWEL OR 17X26 10 PK STRL BLUE (TOWEL DISPOSABLE) IMPLANT
TRAY KYPHOPAK 15/3 ONESTEP 1ST (MISCELLANEOUS) IMPLANT
TRAY KYPHOPAK 20/3 ONESTEP 1ST (MISCELLANEOUS) ×6 IMPLANT
WATER STERILE IRR 1000ML POUR (IV SOLUTION) IMPLANT

## 2016-10-31 NOTE — Transfer of Care (Cosign Needed)
Immediate Anesthesia Transfer of Care Note  Patient: Kayla Berger  Procedure(s) Performed: KYPHOPLASTY LUMBAR ONE, LUMBAR TWO AND  LUMBAR THREE (N/A Back)  Patient Location: PACU  Anesthesia Type:MAC  Level of Consciousness: awake, alert  and oriented  Airway & Oxygen Therapy: Patient Spontanous Breathing and Patient connected to nasal cannula oxygen  Post-op Assessment: Report given to RN, Post -op Vital signs reviewed and stable and Patient moving all extremities X 4  Post vital signs: Reviewed and stable  Last Vitals:  Vitals:   10/31/16 0554 10/31/16 0922  BP: (!) 145/74 (!) 142/86  Pulse: 73   Resp: 20 15  Temp: 36.5 C 36.4 C  SpO2: 100%     Last Pain:  Vitals:   10/31/16 0554  TempSrc: Oral  PainSc: 6       Patients Stated Pain Goal: 7 (42/59/56 [id number])  Complications: No apparent anesthesia complications

## 2016-10-31 NOTE — Anesthesia Postprocedure Evaluation (Signed)
Anesthesia Post Note  Patient: Kayla Berger  Procedure(s) Performed: KYPHOPLASTY LUMBAR ONE, LUMBAR TWO AND  LUMBAR THREE (N/A Back)     Patient location during evaluation: PACU Anesthesia Type: MAC Level of consciousness: awake Pain management: pain level controlled Vital Signs Assessment: post-procedure vital signs reviewed and stable Respiratory status: spontaneous breathing Cardiovascular status: stable Postop Assessment: no apparent nausea or vomiting Anesthetic complications: no    Last Vitals:  Vitals:   10/31/16 1015 10/31/16 1030  BP:  128/74  Pulse: 69 69  Resp: 17 17  Temp:    SpO2: 92% 94%    Last Pain:  Vitals:   10/31/16 0554  TempSrc: Oral  PainSc: 6    Pain Goal: Patients Stated Pain Goal: 7 (10/31/16 0554)  LLE Motor Response: Responds to commands;Purposeful movement (10/31/16 1030) LLE Sensation: Full sensation;No numbness;No pain;No tingling (10/31/16 1030) RLE Motor Response: Responds to commands;Purposeful movement (10/31/16 1030) RLE Sensation: Full sensation;No numbness;No pain;No tingling (10/31/16 1030)      [place]

## 2016-10-31 NOTE — Brief Op Note (Signed)
10/31/2016  9:31 AM  PATIENT:  Kayla Berger  56 y.o. female  PRE-OPERATIVE DIAGNOSIS:  Lumbar 1, 2, and 3 subacute superior end plate compression deformities  POST-OPERATIVE DIAGNOSIS:  Lumbar 1, 2, and 3 subacute superior end plate compression deformities  PROCEDURE:  Procedure(s): KYPHOPLASTY LUMBAR ONE, LUMBAR TWO AND  LUMBAR THREE (N/A)  SURGEON:  Surgeon(s) and Role:    Venita Lick, MD - Primary  PHYSICIAN ASSISTANT:   ASSISTANTS: none   ANESTHESIA:   local and IV sedation  EBL:  Total I/O In: 800 [I.V.:800] Out: 20 [Blood:20]  BLOOD ADMINISTERED:none  DRAINS: none   LOCAL MEDICATIONS USED:  MARCAINE    and OTHER exparel  SPECIMEN:  No Specimen  DISPOSITION OF SPECIMEN:  N/A  COUNTS:  YES  TOURNIQUET:  * No tourniquets in log *  DICTATION: .Dragon Dictation  PLAN OF CARE: Admit for overnight observation  PATIENT DISPOSITION:  PACU - hemodynamically stable.

## 2016-10-31 NOTE — H&P (Signed)
History of Present Illness The patient is a 56 year old female who comes in today for a preoperative History and Physical. The patient is scheduled for a Kyphoplasty L1,L2, L3 to be performed by Dr. Debria Garret D. Shon Baton, MD at [facility] on 10/31/16 . Pt has DM2 and her last A1c was 14.4. her medications have been changed and she reports lower morning sugars of below 130.    Allergies  Hydrocodone-Acetaminophen *ANALGESICS - OPIOID*  Swelling, Swollen lips. TINGLING Allergies Reconciled   Social History  Alcohol use  former drinker Children  1 Current work status  working full time Financial planner (Currently)  no Drug/Alcohol Rehab (Previously)  no Exercise  Exercises weekly; does running / walking Illicit drug use  no Living situation  live alone Marital status  divorced Number of flights of stairs before winded  2-3 Pain Contract  no Tobacco / smoke exposure  no Tobacco use  never smoker  Medication History  Metformin  Active. Baby Aspirin  Active. Lisinopril  Active. Metoprolol  Active. Atorvastatin  Active. Vitamin D 5000IU Active. Biotin Active. Basaglar Insulin Active. Januvia  Active. Cyclobenzaprine  Active. Tramadol  Active. Calcitonin Salmon Nasal Spray Active. Medications Reconciled  Pregnancy / Birth History  Pregnant  no  Past Surgical History Appendectomy  Cataract Surgery  bilateral  Vitals 10/22/2016 10:19 AM Weight: 164 lb Height: 64in Body Surface Area: 1.8 m Body Mass Index: 28.15 kg/m  Temp.: 97.77F  Pulse: 82 (Regular)  BP: 121/68 (Sitting, Left Arm, Standard)  General General Appearance-Not in acute distress. Orientation-Oriented X3. Build & Nutrition-Well nourished and Well developed.  Integumentary General Characteristics Surgical Scars - no surgical scar evidence of previous lumbar surgery. Lumbar Spine-Skin examination of  the lumbar spine is without deformity, skin lesions, lacerations or abrasions.  Chest and Lung Exam Auscultation Breath sounds - Normal and Clear.  Cardiovascular Auscultation Rhythm - Regular rate and rhythm.  Abdomen Palpation/Percussion Palpation and Percussion of the abdomen reveal - Soft, Non Tender and No Rebound tenderness.  Peripheral Vascular Lower Extremity Palpation - Posterior tibial pulse - Bilateral - 2+. Dorsalis pedis pulse - Bilateral - 2+.  Neurologic Sensation Lower Extremity - Bilateral - sensation is intact in the lower extremity. Reflexes Patellar Reflex - Bilateral - 2+. Achilles Reflex - Bilateral - 2+. Clonus - Bilateral - clonus not present. Hoffman's Sign - Bilateral - Hoffman's sign not present. Testing Seated Straight Leg Raise - Bilateral - Seated straight leg raise negative.  Musculoskeletal Spine/Ribs/Pelvis  Lumbosacral Spine: Inspection and Palpation - Tenderness - left lumbar paraspinals tender to palpation and right lumbar paraspinals tender to palpation. Strength and Tone: Strength - Hip Flexion - Bilateral - 5/5. Knee Extension - Bilateral - 5/5. Knee Flexion - Bilateral - 5/5. Ankle Dorsiflexion - Bilateral - 5/5. Ankle Plantarflexion - Bilateral - 5/5. ROM - Flexion - moderately decreased range of motion and painful. Extension - moderately decreased range of motion and painful. Left Lateral Bending - moderately decreased range of motion and painful. Right Lateral Bending - moderately decreased range of motion and painful. Right Rotation - moderately decreased range of motion and painful. Left Rotation - moderately decreased range of motion and painful. Pain - neither flexion or extension is more painful than the other. Lumbosacral Spine - Waddell's Signs - no Waddell's signs present. Lower Extremity Range of Motion - No true hip, knee or ankle pain with range of motion. Gait and Station - [employer] - no assistive devices.  MRI from  September 2018 was significant for subacute superior endplate compression deformities of L1-L2 and L3 no neural compression or stenosis was noted  Clinical impression. Patient has significant debilitating back pain despite appropriate conservative management. Imaging studies confirmed multilevel compression fractures. At this point after reviewing the animated video, as well as a written pamphlet she would like to proceed with a 3 level kyphoplasty. We have discussed the risks which include infection, bleeding, death, stroke, paralysis, cement leak, need for additional surgery, additional fractures, ongoing or worse pain. All of her questions were addressed. We'll plan on moving forward with kyphoplasty with IV sedation and local anesthesia. Preoperative medical clearance was obtained prior primary care physician. Noted is her elevated A1c which her primary care physician is addressing and has developed a plan to reduce.

## 2016-10-31 NOTE — Op Note (Signed)
Operative report.  Preoperative diagnosis. 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  Postoperative diagnosis. Same.  Operative procedure. Kyphoplasty L1, L2, L3.  Complications. There was superior endplate breech with a small amount of cement into the disc space at L1-2 and L1-T12. Of note patient does have transitional anatomy and numbering was starting from he caudal segment which was labeled as L5.This was consistent with the preoperative MRI.  Indications. This is a very pleasant 56 year old woman with ongoing debilitating back pain. Imaging studies confirmed three-level compression fractures of lumbar spine. As a result of the failure of conservative management we elected to proceed with surgery. All appropriate risks benefits and alternatives were discussed with the patient and consent was obtained.  Operative note patient was brought the operating room placed supine the operating room table. The patient was placed prone on the operating room table. The back was then prepped and draped in a standard fashion. Timeout was taken to confirm patient procedure and all other important data. IV sedation was obtained and maintained by the anesthesiology team. C arms were then draped and then brought into the field 1 in the AP, the other in the lateral. With biplane fluoroscopy I identified the L3 vertebral body. Again it should be noted the patient did have an extra lumbar vertebrae. Based on the preoperative MRI I confirmed the L5 vertebral body and used this to baseline numbering of the lumbar spine. This was consistent with preoperative MRI. The lateral borders of the L3 pedicle and marked and then the area was infiltrated with quarter percent Marcaine mixed with Exparel. Achieving local anesthesia made a small incision and then advanced the Jamshidi needle down to the lateral aspect of the pedicle. Using fluoroscopic guidance in both the AP and lateral planes I advanced the Jamshidi needle into  the pedicle. Once I was nearing the medial wall of the pedicle based on the AP view I confirmed in the lateral that I was just at or beyond the posterior wall vertebral body. I then advanced the Jamshidi needle into the vertebral body. Using the same technique I cannulated the contralateral pedicle. I then used the drill and then sounded the canal to ensure that I had a solid bony canal. I then placed the inflatable bone tamps and inflated them. I then deflated and and then inserted the bone cement. At L3-L4 excellent cement placement underneath the endplate and I had cement interdigitating across the midline. Once I had adequately filled the L3 level I then turned my attention to L2. Using the same exact technique that used the L3 I cannulated the L2 pedicles. I placed the inflatable bone tamp inflated. And then inserted the cement.During the course of inserting cement on right side I noted a small amount of cement beginning to leak L1-2 disc space. Once I noted cement restrictor leak stopped inserting cement. The cement was allowed to cure. At this point I went to the L1 level and using the same exact technique cannulated the pedicles placed the inflatable bone tamp and then inserted the cement. Again there was a breech superior endplate  and again a small amount of cement leak into the superior L1-T12 disc space. At this point the back was then cleaned and a simple 3-0 Monocryl stitch was used to close all 6 stab incisions. I then placed Dermabond and a Band-Aid. The patient was then transferred to the PACU without incident. She remained neurologically intact throughout the case. She remained hemodynamically stable throughout the case. The patient will  be discharged to home with appropriate follow-up.

## 2016-10-31 NOTE — Evaluation (Signed)
Physical Therapy Evaluation Patient Details Name: Kayla Berger MRN: [id number] DOB: Dec 21, 1960 Today's Date: 10/31/2016   History of Present Illness  Pt is a 56 y/o female who presents s/p L1-L3 kyphoplasty. PMH significant for L hip bursitis, hypothyroidism, migraines, IDDM, HTN.  Clinical Impression  Pt admitted with above diagnosis. Pt currently with functional limitations due to the deficits listed below (see PT Problem List). At the time of PT eval pt was able to perform transfers and ambulation with gross min guard for balance support and safety. Pt with increased pain during initial ambulation, and reports improvement with RW use. Pt will benefit from skilled PT to increase their independence and safety with mobility to allow discharge to the venue listed below.     Follow Up Recommendations No PT follow up;Supervision for mobility/OOB    Equipment Recommendations  Rolling walker with 5" wheels;3in1 (PT)    Recommendations for Other Services       Precautions / Restrictions Precautions Precautions: Fall;Back Precaution Booklet Issued: Yes (comment) Precaution Comments: Reviewed handout, and pt was cued for precautions during functional mobility.  Restrictions Weight Bearing Restrictions: No      Mobility  Bed Mobility Overal bed mobility: Needs Assistance Bed Mobility: Rolling;Sidelying to Sit Rolling: Min guard Sidelying to sit: Min guard       General bed mobility comments: VC's for proper log roll technique. Pt was able to transition to EOB without physical assist. Hands-on guarding for safety and maintenance of precautions.   Transfers Overall transfer level: Needs assistance Equipment used: Rolling walker (2 wheeled) Transfers: Sit to/from Stand Sit to Stand: Min assist         General transfer comment: VC's for hand placement on seated surface for safety. Pt was able to perform transfers with hands-on guarding for safety and assist to power-up to full  stand.   Ambulation/Gait Ambulation/Gait assistance: Min guard Ambulation Distance (Feet): 150 Feet Assistive device: Rolling walker (2 wheeled) Gait Pattern/deviations: Step-through pattern;Decreased stride length;Trunk flexed Gait velocity: Decreased Gait velocity interpretation: Below normal speed for age/gender General Gait Details: Slow and guarded. Pt was able to ambulate with increased stability with the RW for support. Pt reports pain decreased from a 10 to a 9 during gait training.   Stairs            Wheelchair Mobility    Modified Rankin (Stroke Patients Only)       Balance Overall balance assessment: Needs assistance Sitting-balance support: Feet supported;No upper extremity supported Sitting balance-Leahy Scale: Fair     Standing balance support: No upper extremity supported Standing balance-Leahy Scale: Poor Standing balance comment: reaching for external support during dynamic standing activity                             Pertinent Vitals/Pain Pain Assessment: 0-10 Pain Score: 9  Pain Location: Incision site Pain Descriptors / Indicators: Operative site guarding;Discomfort Pain Intervention(s): Monitored during session;Limited activity within patient's tolerance;Repositioned    Home Living Family/patient expects to be discharged to:: Private residence Living Arrangements: Other relatives Available Help at Discharge: Family;Available 24 hours/day Type of Home: House Home Access: Stairs to enter Entrance Stairs-Rails: Right Entrance Stairs-Number of Steps: 3 Home Layout: One level Home Equipment: None      Prior Function Level of Independence: Independent               Hand Dominance        Extremity/Trunk Assessment  Upper Extremity Assessment Upper Extremity Assessment: Overall WFL for tasks assessed    Lower Extremity Assessment Lower Extremity Assessment: Generalized weakness    Cervical / Trunk  Assessment Cervical / Trunk Assessment: Other exceptions Cervical / Trunk Exceptions: s/p surgery  Communication   Communication: No difficulties  Cognition Arousal/Alertness: Awake/alert Behavior During Therapy: WFL for tasks assessed/performed Overall Cognitive Status: Within Functional Limits for tasks assessed                                        General Comments      Exercises     Assessment/Plan    PT Assessment Patient needs continued PT services  PT Problem List Decreased strength;Decreased range of motion;Decreased activity tolerance;Decreased balance;Decreased mobility;Decreased knowledge of use of DME;Decreased safety awareness;Decreased knowledge of precautions;Pain       PT Treatment Interventions DME instruction;Gait training;Stair training;Functional mobility training;Therapeutic activities;Therapeutic exercise;Neuromuscular re-education;Patient/family education    PT Goals (Current goals can be found in the Care Plan section)  Acute Rehab PT Goals Patient Stated Goal: Return home PT Goal Formulation: With patient/family Time For Goal Achievement: 11/07/16 Potential to Achieve Goals: Good    Frequency Min 5X/week   Barriers to discharge        Co-evaluation               AM-PAC PT "6 Clicks" Daily Activity  Outcome Measure Difficulty turning over in bed (including adjusting bedclothes, sheets and blankets)?: None Difficulty moving from lying on back to sitting on the side of the bed? : A Little Difficulty sitting down on and standing up from a chair with arms (e.g., wheelchair, bedside commode, etc,.)?: A Little Help needed moving to and from a bed to chair (including a wheelchair)?: A Little Help needed walking in hospital room?: A Little Help needed climbing 3-5 steps with a railing? : A Little 6 Click Score: 19    End of Session Equipment Utilized During Treatment: Gait belt Activity Tolerance: Patient limited by  fatigue;Patient limited by pain Patient left: in chair;with call bell/phone within reach;with family/visitor present Nurse Communication: Mobility status PT Visit Diagnosis: Unsteadiness on feet (R26.81);Pain Pain - part of body:  (back)    Time: 1417-1440 PT Time Calculation (min) (ACUTE ONLY): 23 min   Charges:   PT Evaluation $PT Eval Moderate Complexity: 1 Mod PT Treatments $Gait Training: 8-22 mins   PT G Codes:   PT G-Codes **NOT FOR INPATIENT CLASS** Functional Assessment Tool Used: Clinical judgement Functional Limitation: Mobility: Walking and moving around Mobility: Walking and Moving Around Current Status ([id number]): At least 20 percent but less than 40 percent impaired, limited or restricted Mobility: Walking and Moving Around Goal Status [phone]): At least 1 percent but less than 20 percent impaired, limited or restricted    Conni Slipper, PT, DPT Acute Rehabilitation Services Pager: [phone]   Marylynn Pearson 10/31/2016, 2:56 PM

## 2016-11-01 ENCOUNTER — Encounter (HOSPITAL_COMMUNITY): Payer: Self-pay | Admitting: Orthopedic Surgery

## 2016-11-01 DIAGNOSIS — M4856XA Collapsed vertebra, not elsewhere classified, lumbar region, initial encounter for fracture: Secondary | ICD-10-CM | POA: Diagnosis not present

## 2016-11-01 LAB — GLUCOSE, CAPILLARY: Glucose-Capillary: 145 mg/dL — ABNORMAL HIGH (ref 65–99)

## 2016-11-01 NOTE — Evaluation (Signed)
Occupational Therapy Evaluation and Discharge Patient Details Name: Kayla Berger MRN: [id number] DOB: 1960-06-05 Today's Date: 11/01/2016    History of Present Illness Pt is a 56 y/o female who presents s/p L1-L3 kyphoplasty. PMH significant for L hip bursitis, hypothyroidism, migraines, IDDM, HTN.   Clinical Impression   Pt reports she was independent with ADL PTA. Currently pt supervision with ADL and functional mobility. All back, safety, and ADL education completed with pt. Pt planning to d/c home with 24/7 supervision from family. No further acute OT needs identified; signing off at this time. Please re-consult if needs change. Thank you for this referral.     Follow Up Recommendations  No OT follow up;Supervision/Assistance - 24 hour (initially)    Equipment Recommendations  3 in 1 bedside commode (already delivered to room)    Recommendations for Other Services       Precautions / Restrictions Precautions Precautions: Fall;Back Precaution Booklet Issued: No Precaution Comments: Pt able to recall 3/3 back precautions at start of session Restrictions Weight Bearing Restrictions: No      Mobility Bed Mobility               General bed mobility comments: Pt sitting EOB upon arrival  Transfers Overall transfer level: Needs assistance Equipment used: Rolling walker (2 wheeled);None Transfers: Sit to/from Stand Sit to Stand: Supervision         General transfer comment: Good hand placement and technqiue. Pt able to perform sit to stand with and without use of RW    Balance Overall balance assessment: Needs assistance Sitting-balance support: Feet supported;No upper extremity supported Sitting balance-Leahy Scale: Good     Standing balance support: No upper extremity supported;During functional activity Standing balance-Leahy Scale: Fair                             ADL either performed or assessed with clinical judgement   ADL Overall  ADL's : Needs assistance/impaired Eating/Feeding: Set up;Sitting   Grooming: Supervision/safety;Standing Grooming Details (indicate cue type and reason): Educated on use of 2 cups for oral care Upper Body Bathing: Set up;Sitting   Lower Body Bathing: Supervison/ safety;Sit to/from stand   Upper Body Dressing : Set up;Sitting   Lower Body Dressing: Supervision/safety;Sit to/from stand Lower Body Dressing Details (indicate cue type and reason): Pt able to cross foot over opposite knee. Educated on compensatory strategies for LB ADL Toilet Transfer: Supervision/safety;Ambulation;Comfort height toilet;Grab bars   Toileting- Clothing Manipulation and Hygiene: Supervision/safety;Sit to/from stand Toileting - Clothing Manipulation Details (indicate cue type and reason): Educated on proper technique for peri care without twisting and use of wet wipes Tub/ Shower Transfer: Supervision/safety;Tub transfer;Ambulation Tub/Shower Transfer Details (indicate cue type and reason): Pt able to simulate tub transfer technique in room with supervision. Educated on use of 3 in 1 in tub as a shower chair Functional mobility during ADLs: Supervision/safety;Rolling walker General ADL Comments: Educated pt on maintaining back precuations during functional activities, keeping frequently used items at counter top height, frequent mobility thorughout the day upon return home     Vision         Perception     Praxis      Pertinent Vitals/Pain Pain Assessment: Faces Faces Pain Scale: Hurts even more Pain Location: back Pain Descriptors / Indicators: Aching;Sore Pain Intervention(s): Monitored during session;Repositioned     Hand Dominance     Extremity/Trunk Assessment Upper Extremity Assessment Upper Extremity Assessment: Overall WFL for  tasks assessed   Lower Extremity Assessment Lower Extremity Assessment: Defer to PT evaluation   Cervical / Trunk Assessment Cervical / Trunk Assessment: Other  exceptions Cervical / Trunk Exceptions: s/p spinal sx   Communication Communication Communication: No difficulties   Cognition Arousal/Alertness: Awake/alert Behavior During Therapy: WFL for tasks assessed/performed Overall Cognitive Status: Within Functional Limits for tasks assessed                                     General Comments       Exercises     Shoulder Instructions      Home Living Family/patient expects to be discharged to:: Private residence Living Arrangements: Children Available Help at Discharge: Family;Available 24 hours/day (sister coming to stay for a few weeks) Type of Home: House Home Access: Stairs to enter [employer] of Steps: 3 Entrance Stairs-Rails: Right Home Layout: One level     Bathroom Shower/Tub: IT trainer: Standard     Home Equipment: None          Prior Functioning/Environment Level of Independence: Independent                 OT Problem List:        OT Treatment/Interventions:      OT Goals(Current goals can be found in the care plan section) Acute Rehab OT Goals Patient Stated Goal: Return home OT Goal Formulation: All assessment and education complete, DC therapy  OT Frequency:     Barriers to D/C:            Co-evaluation              AM-PAC PT "6 Clicks" Daily Activity     Outcome Measure Help from another person eating meals?: None Help from another person taking care of personal grooming?: A Little Help from another person toileting, which includes using toliet, bedpan, or urinal?: A Little Help from another person bathing (including washing, rinsing, drying)?: A Little Help from another person to put on and taking off regular upper body clothing?: A Little Help from another person to put on and taking off regular lower body clothing?: A Little 6 Click Score: 19   End of Session Equipment Utilized During Treatment: Rolling walker Nurse  Communication: Mobility status;Other (comment) (no equipment or f/u needs)  Activity Tolerance: Patient tolerated treatment well Patient left: in chair;with call bell/phone within reach  OT Visit Diagnosis: Other abnormalities of gait and mobility (R26.89);Unsteadiness on feet (R26.81)                Time: 6578-4696 OT Time Calculation (min): 32 min Charges:  OT General Charges $OT Visit: 1 Visit OT Evaluation $OT Eval Moderate Complexity: 1 Mod OT Treatments $Self Care/Home Management : 8-22 mins G-Codes: OT G-codes **NOT FOR INPATIENT CLASS** Functional Assessment Tool Used: Clinical judgement Functional Limitation: Self care Self Care Current Status (E9528): At least 1 percent but less than 20 percent impaired, limited or restricted Self Care Goal Status (U1324): At least 1 percent but less than 20 percent impaired, limited or restricted Self Care Discharge Status [phone]): At least 1 percent but less than 20 percent impaired, limited or restricted   Fredric Mare A. Brett Albino, M.S., OTR/L Pager: [phone]  Gaye Alken 11/01/2016, 11:03 AM

## 2016-11-01 NOTE — Progress Notes (Signed)
    Subjective: 1 Day Post-Op Procedure(s) (LRB): KYPHOPLASTY LUMBAR ONE, LUMBAR TWO AND  LUMBAR THREE (N/A) Patient reports pain as 5 on 0-10 scale.   Denies CP or SOB.  Voiding without difficulty. Positive flatus. Pt ambulating in hallway. Objective: Vital signs in last 24 hours: Temp:  [97.6 F (36.4 C)-98.7 F (37.1 C)] 98.4 F (36.9 C) (10/05 0400) Pulse Rate:  [68-81] 80 (10/05 0400) Resp:  [13-20] 20 (10/05 0400) BP: (124-174)/(74-99) 149/87 (10/05 0400) SpO2:  [92 %-100 %] 100 % (10/05 0400)  Intake/Output from previous day: 10/04 0701 - 10/05 0700 In: 850 [I.V.:800; IV Piggyback:50] Out: 20 [Blood:20] Intake/Output this shift: No intake/output data recorded.  Labs: No results for input(s): HGB in the last 72 hours. No results for input(s): WBC, RBC, HCT, PLT in the last 72 hours. No results for input(s): NA, K, CL, CO2, BUN, CREATININE, GLUCOSE, CALCIUM in the last 72 hours. No results for input(s): LABPT, INR in the last 72 hours.  Physical Exam: Neurologically intact ABD soft Sensation intact distally Dorsiflexion/Plantar flexion intact Incision: no drainage Compartment soft  Assessment/Plan: 1 Day Post-Op Procedure(s) (LRB): KYPHOPLASTY LUMBAR ONE, LUMBAR TWO AND  LUMBAR THREE (N/A) Advance diet Up with therapy  Plan for DC today after cleared by PT Post op meds on chart Pt will present to clinic in 2 weeks  [place], [place] for Dr. Venita Lick [facility] [phone] 11/01/2016, 7:08 AM    Patient ID: Kayla Berger, female   DOB: 1960/03/18, 55 y.o.   MRN: [id number]

## 2016-11-01 NOTE — Discharge Instructions (Signed)
Balloon Kyphoplasty, Care After °Refer to this sheet in the next few weeks. These instructions provide you with information about caring for yourself after your procedure. Your health care provider may also give you more specific instructions. Your treatment has been planned according to current medical practices, but problems sometimes occur. Call your health care provider if you have any problems or questions after your procedure. °What can I expect after the procedure? °After your procedure, it is common to have back pain. °Follow these instructions at home: °Incision care °· Follow instructions from your health care provider about how to take care of your incisions. Make sure you: °? Wash your hands with soap and water before you change your bandage (dressing). If soap and water are not available, use hand sanitizer. °? Change your dressing as told by your health care provider. °? Leave stitches (sutures), skin glue, or adhesive strips in place. These skin closures may need to be in place for 2 weeks or longer. If adhesive strip edges start to loosen and curl up, you may trim the loose edges. Do not remove adhesive strips completely unless your health care provider tells you to do that. °· Check your incision area every day for signs of infection. Watch for: °? Redness, swelling, or pain. °? Fluid, blood, or pus. °· Keep your dressing dry until your health care provider says that it can be removed. °Activity ° °· Rest your back and avoid intense physical activity for as long as told by your health care provider. °· Return to your normal activities as told by your health care provider. Ask your health care provider what activities are safe for you. °· Do not lift anything that is heavier than 10 lb (4.5 kg). This is about the weight of a gallon of milk. You may need to avoid heavy lifting for several weeks. °General instructions °· Take over-the-counter and prescription medicines only as told by your health care  provider. °· If directed, apply ice to the painful area: °? Put ice in a plastic bag. °? Place a towel between your skin and the bag. °? Leave the ice on for 20 minutes, 2-3 times per day. °· Do not use tobacco products, including cigarettes, chewing tobacco, or e-cigarettes. If you need help quitting, ask your health care provider. °· Keep all follow-up visits as told by your health care provider. This is important. °Contact a health care provider if: °· You have a fever. °· You have redness, swelling, or pain at the site of your incisions. °· You have fluid, blood, or pus coming from your incisions. °· You have pain that gets worse or does not get better with medicine. °· You develop numbness or weakness in any part of your body. °Get help right away if: °· You have chest pain. °· You have difficulty breathing. °· You cannot move your legs. °· You cannot control your bladder or bowel movements. °· You suddenly become weak or numb on one side of your body. °· You become very confused. °· You have trouble speaking or understanding, or both. °This information is not intended to replace advice given to you by your health care provider. Make sure you discuss any questions you have with your health care provider. °Document Released: 10/05/2014 Document Revised: 06/22/2015 Document Reviewed: 05/09/2014 °Elsevier Interactive Patient Education © 2018 [employer] ° °

## 2016-11-01 NOTE — Progress Notes (Signed)
Physical Therapy Treatment Patient Details Name: Kayla Berger MRN: [id number] DOB: May 21, 1960 Today's Date: 11/01/2016    History of Present Illness Pt is a 56 y/o female who presents s/p L1-L3 kyphoplasty. PMH significant for L hip bursitis, hypothyroidism, migraines, IDDM, HTN.    PT Comments    Pt progressing towards physical therapy goals. Was able to demonstrate increased independence this session with all functional mobility. Pt anticipates d/c home this afternoon. Will continue to follow and progress as able per POC.    Follow Up Recommendations  No PT follow up;Supervision for mobility/OOB     Equipment Recommendations  Rolling walker with 5" wheels;3in1 (PT)    Recommendations for Other Services       Precautions / Restrictions Precautions Precautions: Fall;Back Precaution Booklet Issued: No Precaution Comments: Pt able to recall 3/3 back precautions at start of session Restrictions Weight Bearing Restrictions: No    Mobility  Bed Mobility Overal bed mobility: Needs Assistance Bed Mobility: Rolling;Sidelying to Sit Rolling: Min guard Sidelying to sit: Min guard       General bed mobility comments: VC's for proper log roll technique. Pt was able to transition to EOB without physical assist.   Transfers Overall transfer level: Needs assistance Equipment used: Rolling walker (2 wheeled);None Transfers: Sit to/from Stand Sit to Stand: Supervision         General transfer comment: Good hand placement and technqiue. Pt able to perform sit to stand with and without use of RW  Ambulation/Gait Ambulation/Gait assistance: Min guard Ambulation Distance (Feet): 350 Feet Assistive device: Rolling walker (2 wheeled) Gait Pattern/deviations: Step-through pattern;Decreased stride length;Trunk flexed Gait velocity: Decreased Gait velocity interpretation: Below normal speed for age/gender General Gait Details: Slow and guarded. Pt was able to ambulate with  increased stability with the RW for support.    Stairs Stairs: Yes   Stair Management: One rail Right;Step to pattern;Forwards Number of Stairs: 3 General stair comments: VC's for sequencing and general safety. Pt able to complete stair training with close guard for safety, however no physical assist required.  Wheelchair Mobility    Modified Rankin (Stroke Patients Only)       Balance Overall balance assessment: Needs assistance Sitting-balance support: Feet supported;No upper extremity supported Sitting balance-Leahy Scale: Good     Standing balance support: No upper extremity supported;During functional activity Standing balance-Leahy Scale: Fair                              Cognition Arousal/Alertness: Awake/alert Behavior During Therapy: WFL for tasks assessed/performed Overall Cognitive Status: Within Functional Limits for tasks assessed                                        Exercises      General Comments        Pertinent Vitals/Pain Pain Assessment: Faces Faces Pain Scale: Hurts even more Pain Location: back Pain Descriptors / Indicators: Aching;Sore Pain Intervention(s): Limited activity within patient's tolerance;Monitored during session;Repositioned    Home Living Family/patient expects to be discharged to:: Private residence Living Arrangements: Children Available Help at Discharge: Family;Available 24 hours/day (sister coming to stay for a few weeks) Type of Home: House Home Access: Stairs to enter Entrance Stairs-Rails: Right Home Layout: One level Home Equipment: None      Prior Function Level of Independence: Independent  PT Goals (current goals can now be found in the care plan section) Acute Rehab PT Goals Patient Stated Goal: Return home PT Goal Formulation: With patient/family Time For Goal Achievement: 11/07/16 Potential to Achieve Goals: Good Progress towards PT goals: Progressing toward  goals    Frequency    Min 5X/week      PT Plan Current plan remains appropriate    Co-evaluation              AM-PAC PT "6 Clicks" Daily Activity  Outcome Measure  Difficulty turning over in bed (including adjusting bedclothes, sheets and blankets)?: None Difficulty moving from lying on back to sitting on the side of the bed? : A Little Difficulty sitting down on and standing up from a chair with arms (e.g., wheelchair, bedside commode, etc,.)?: A Little Help needed moving to and from a bed to chair (including a wheelchair)?: A Little Help needed walking in hospital room?: A Little Help needed climbing 3-5 steps with a railing? : A Little 6 Click Score: 19    End of Session Equipment Utilized During Treatment: Gait belt Activity Tolerance: Patient limited by fatigue;Patient limited by pain Patient left: in chair;with call bell/phone within reach;with family/visitor present Nurse Communication: Mobility status PT Visit Diagnosis: Unsteadiness on feet (R26.81);Pain Pain - part of body:  (back)     Time: 1610-9604 PT Time Calculation (min) (ACUTE ONLY): 21 min  Charges:  $Gait Training: 8-22 mins                    G Codes:       Conni Slipper, PT, DPT Acute Rehabilitation Services Pager: [phone]    Marylynn Pearson 11/01/2016, 2:40 PM

## 2016-11-01 NOTE — Progress Notes (Signed)
Patient is discharged from room 3C06 at this time. Alert and in stable condition. IV site d/c'd and instructions read to patient and sister with understanding verbalized. Left unit via wheelchair with all belongings at side.

## 2016-11-05 ENCOUNTER — Other Ambulatory Visit: Payer: Self-pay | Admitting: Physician Assistant

## 2016-11-11 NOTE — Discharge Summary (Signed)
Physician Discharge Summary  Patient ID: Kayla Berger MRN: [id number] DOB/AGE: [birth year] 56 y.o.  Admit date: 10/31/2016 Discharge date: 11/01/16  Admission Diagnoses:  <principal problem not specified>  Discharge Diagnoses:  Active Problems:   Closed compression fracture of L2 lumbar vertebra [facility])   Past Medical History:  Diagnosis Date  . Arthritis   . Complication of anesthesia    "hard to awaken"  . Dysrhythmia    irregular heartbeat- rx  . History of kidney stones   . Hypertension   . Hypothyroidism    synthroid in past no med now  . 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 Migraines    occ  . PCOS (polycystic ovarian syndrome) 1986  . Shoulder pain   . Thyroid disease   . Trochanteric bursitis of left hip   . Vaginal cysts    4 have been removed    Surgeries: Procedure(s): KYPHOPLASTY LUMBAR ONE, LUMBAR TWO AND  LUMBAR THREE on 10/31/2016   Consultants (if any):   Discharged Condition: Improved  Hospital Course: Kayla Berger is an 56 y.o. female who was admitted 10/31/2016 with a diagnosis of L1, L2 and L3 compression fractures and went to the operating room on 10/31/2016 and underwent the above named procedures.  Post op day one pt reports moderate pain controlled on oral medication.  Pt is ambulating in hall.  Pt is cleared by PT for DC.  Pt is eating and urinating w/o issue.   She was given perioperative antibiotics:  Anti-infectives    Start     Dose/Rate Route Frequency Ordered Stop   10/31/16 1400  ceFAZolin (ANCEF) IVPB 1 g/50 mL premix     1 g 100 mL/hr over 30 Minutes Intravenous Every 8 hours 10/31/16 0942 11/01/16 0743   10/31/16 0537  ceFAZolin (ANCEF) IVPB 2g/100 mL premix     2 g 200 mL/hr over 30 Minutes Intravenous 30 min pre-op 10/31/16 0537 10/31/16 0740    .  She was given sequential compression devices, early ambulation, and TED for DVT prophylaxis.  She benefited maximally from the hospital stay and  there were no complications.    Recent vital signs:  Vitals:   11/01/16 0400 11/01/16 0752  BP: (!) 149/87 (!) 149/92  Pulse: 80 84  Resp: 20 18  Temp: 98.4 F (36.9 C) 98.9 F (37.2 C)  SpO2: 100% 99%    Recent laboratory studies:  Lab Results  Component Value Date   HGB 12.8 10/25/2016   HGB 14.0 07/02/2012   HGB 14.6 06/26/2012   Lab Results  Component Value Date   WBC 8.1 10/25/2016   PLT 211 10/25/2016   No results found for: INR Lab Results  Component Value Date   NA 141 10/25/2016   K 4.0 10/25/2016   CL 106 10/25/2016   CO2 28 10/25/2016   BUN 8 10/25/2016   CREATININE 0.65 10/25/2016   GLUCOSE 223 (H) 10/25/2016    Discharge Medications:   Allergies as of 11/01/2016      Reactions   Hydrocodone Other (See Comments)   Numbness and tingling      Medication List    STOP taking these medications   traMADol 50 MG tablet Commonly known as:  ULTRAM     TAKE these medications   atorvastatin 20 MG tablet Commonly known as:  LIPITOR Take 20 mg by mouth every evening.   BABY ASPIRIN 81 MG chewable tablet Generic drug:  aspirin  Chew 81 mg by mouth daily.   BASAGLAR KWIKPEN 100 UNIT/ML Sopn Inject 20 Units into the skin at bedtime.   Biotin 1000 MCG tablet Take 1,000 mcg by mouth daily.   calcitonin (salmon) 200 UNIT/ACT nasal spray Commonly known as:  MIACALCIN/FORTICAL Place 1 spray into alternate nostrils daily as needed.   lisinopril 10 MG tablet Commonly known as:  PRINIVIL,ZESTRIL Take 1 tablet (10 mg total) by mouth daily.   metFORMIN 500 MG tablet Commonly known as:  GLUCOPHAGE Take 1,000 mg by mouth 2 (two) times daily with a meal.   metoprolol tartrate 25 MG tablet Commonly known as:  LOPRESSOR Take 1 tablet (25 mg total) by mouth 2 (two) times daily.   ondansetron 4 MG tablet Commonly known as:  ZOFRAN Take 1 tablet (4 mg total) by mouth every 8 (eight) hours as needed for nausea or vomiting.   oxyCODONE-acetaminophen 10-325  MG tablet Commonly known as:  PERCOCET Take 1 tablet by mouth every 4 (four) hours as needed for pain.   sitaGLIPtin 100 MG tablet Commonly known as:  JANUVIA Take 1 tablet (100 mg total) by mouth daily.   Vitamin D 2000 units tablet Take 2,000 Units by mouth daily.       Diagnostic Studies: Dg Lumbar Spine Complete  Result Date: 10/31/2016 CLINICAL DATA:  56 year old female undergoing multilevel kyphoplasty. EXAM: LUMBAR SPINE - COMPLETE 4+ VIEW; DG C-ARM 61-120 MIN COMPARISON:  Lumbar radiographs 09/17/2016. Chest CTA 06/13/2009. CT Abdomen and Pelvis 12/11/2014. FINDINGS: Transitional anatomy. Normal thoracic segmentation demonstrated on the CT scout view of the 2011 CTA, with 12 full size rib pairs. Therefore, there is a lumbarized S1 level demonstrated on the August lumbar radiographs. The for fluoroscopic intraoperative spot views of the lumbar spine in the AP and lateral projection demonstrate bone cement within the L2, L3, and L4 vertebral bodies. There is also a small volume of bone cement in the L1-L2 and L2-L3 disc spaces. FLUOROSCOPY TIME:  6 minutes 0 seconds IMPRESSION: Transitional lumbosacral anatomy with sequelae of L2, L3, and L4 vertebral body augmentation as above. Electronically Signed   By: Odessa Fleming M.D.   On: 10/31/2016 10:12   Dg C-arm 61-120 Min  Result Date: 10/31/2016 CLINICAL DATA:  56 year old female undergoing multilevel kyphoplasty. EXAM: LUMBAR SPINE - COMPLETE 4+ VIEW; DG C-ARM 61-120 MIN COMPARISON:  Lumbar radiographs 09/17/2016. Chest CTA 06/13/2009. CT Abdomen and Pelvis 12/11/2014. FINDINGS: Transitional anatomy. Normal thoracic segmentation demonstrated on the CT scout view of the 2011 CTA, with 12 full size rib pairs. Therefore, there is a lumbarized S1 level demonstrated on the August lumbar radiographs. The for fluoroscopic intraoperative spot views of the lumbar spine in the AP and lateral projection demonstrate bone cement within the L2, L3, and L4  vertebral bodies. There is also a small volume of bone cement in the L1-L2 and L2-L3 disc spaces. FLUOROSCOPY TIME:  6 minutes 0 seconds IMPRESSION: Transitional lumbosacral anatomy with sequelae of L2, L3, and L4 vertebral body augmentation as above. Electronically Signed   By: Odessa Fleming M.D.   On: 10/31/2016 10:12    Disposition: 01-Home or Self Care Pt will present to clinic in 2 weeks Post op medication provided  Discharge Instructions    Incentive spirometry RT    Complete by:  As directed       Follow-up Information    Venita Lick, MD. Schedule an appointment as soon as possible for a visit in 2 weeks.   Specialty:  Orthopedic Surgery  Why:  If symptoms worsen, For wound re-check, For suture removal Contact information: [address] [place] [address] [phone]            Signed: Kirt Boys 11/11/2016, 8:10 AM

## 2016-12-05 ENCOUNTER — Other Ambulatory Visit: Payer: Self-pay | Admitting: Physician Assistant

## 2016-12-08 ENCOUNTER — Other Ambulatory Visit: Payer: Self-pay | Admitting: Physician Assistant

## 2016-12-13 ENCOUNTER — Other Ambulatory Visit: Payer: Self-pay | Admitting: Physician Assistant

## 2016-12-16 ENCOUNTER — Other Ambulatory Visit: Payer: Self-pay | Admitting: Physician Assistant

## 2016-12-17 ENCOUNTER — Other Ambulatory Visit: Payer: Self-pay | Admitting: Physician Assistant

## 2018-04-06 IMAGING — RF DG C-ARM 61-120 MIN
1 series · 2 of 2 positions shown · non-contrast
Comparison: Lumbar radiographs 09/17/2016. Chest CTA 06/13/2009. CT
Abdomen and Pelvis 12/11/2014.

CLINICAL DATA: 56-year-old female undergoing multilevel
kyphoplasty.

EXAM:
LUMBAR SPINE - COMPLETE 4+ VIEW; DG C-ARM 61-120 MIN

[Series 1: run · 2 of 2 slices shown]
[im 1/2]
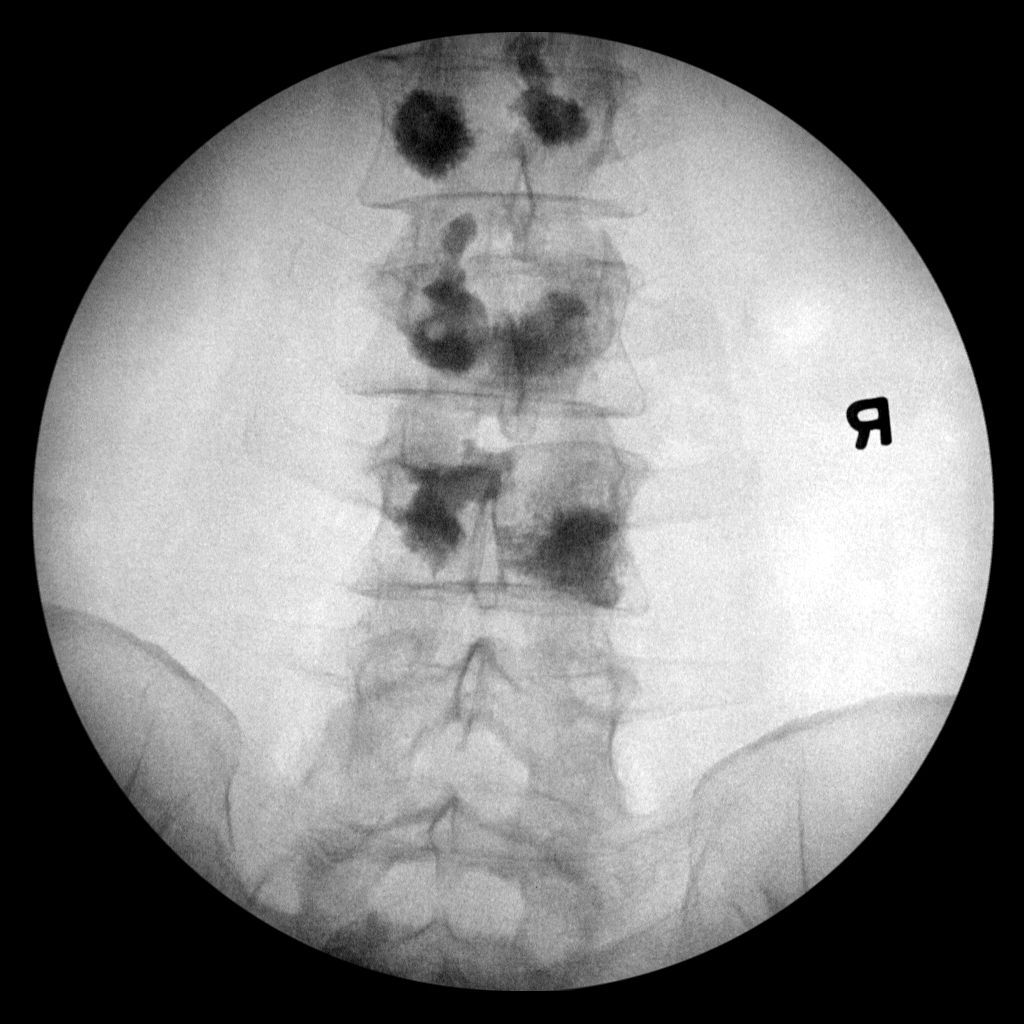
[im 2/2]
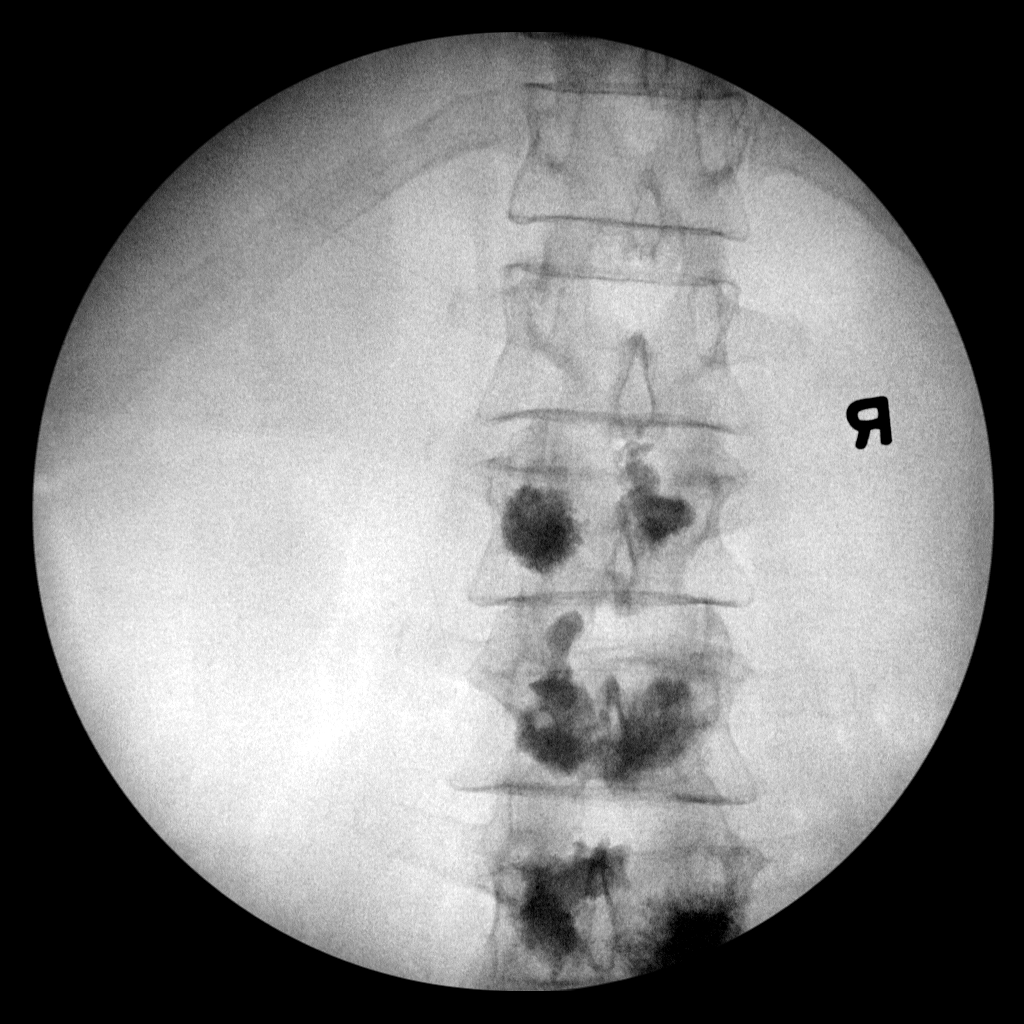

[2 of 2 positions shown; findings below may reference images not displayed]

FINDINGS: Transitional anatomy.

Normal thoracic segmentation demonstrated on the CT scout view of
the 9411 CTA, with 12 full size rib pairs.

Therefore, there is a lumbarized S1 level demonstrated on the [facility]
lumbar radiographs.

The for fluoroscopic intraoperative spot views of the lumbar spine
in the AP and lateral projection demonstrate bone cement within the
L2, L3, and L4 vertebral bodies. There is also a small volume of
bone cement in the L1-L2 and L2-L3 disc spaces.

FLUOROSCOPY TIME:  6 minutes 0 seconds
IMPRESSION: Transitional lumbosacral anatomy with sequelae of L2, L3, and L4
vertebral body augmentation as above.

## 2020-07-06 ENCOUNTER — Encounter: Payer: Self-pay | Admitting: Physical Therapy

## 2020-07-06 ENCOUNTER — Ambulatory Visit: Payer: 59 | Attending: Neurosurgery | Admitting: Physical Therapy

## 2020-07-06 ENCOUNTER — Other Ambulatory Visit: Payer: Self-pay

## 2020-07-06 DIAGNOSIS — M5416 Radiculopathy, lumbar region: Secondary | ICD-10-CM

## 2020-07-06 DIAGNOSIS — R2681 Unsteadiness on feet: Secondary | ICD-10-CM

## 2020-07-06 DIAGNOSIS — M6281 Muscle weakness (generalized): Secondary | ICD-10-CM | POA: Diagnosis present

## 2020-07-06 NOTE — Patient Instructions (Signed)
Access Code: [email or web address] URL: [email or web address] Date: 07/06/2020 Prepared by: Raynelle Fanning  Exercises Supine Bridge - 2 x daily - 7 x weekly - 1-3 sets - 10 reps Lying Prone - 3-4 x daily - 7 x weekly - 1 sets - 1 reps - 5-10 min hold  Patient Education TENS Unit Posture and Body Mechanics

## 2020-07-06 NOTE — Therapy (Signed)
[facility] [address] [place], [place], [address] Phone: [phone]   Fax:  [phone]  Physical Therapy Evaluation  Patient Details  Name: Kayla FowlerJonsie L Kimmet MRN: [id number] Date of Birth: [birth year] Referring Provider (PT): Donalee CitrinGary Cram MD   Encounter Date: 07/06/2020   PT End of Session - 07/06/20 0809     Visit Number 1    Number of Visits 16    Date for PT Re-Evaluation 08/31/20    Authorization Type [employer]    PT Start Time 0810    PT Stop Time 0912    PT Time Calculation (min) 62 min    Activity Tolerance Patient tolerated treatment well    Behavior During Therapy [facility] for tasks assessed/performed             Past Medical History:  Diagnosis Date   Arthritis    Complication of anesthesia    "hard to awaken"   Dysrhythmia    irregular heartbeat- rx   History of kidney stones    Hypertension    Hypothyroidism    synthroid in past no med now   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   Migraines    occ   PCOS (polycystic ovarian syndrome) 1986   Shoulder pain    Thyroid disease    Trochanteric bursitis of left hip    Vaginal cysts    4 have been removed    Past Surgical History:  Procedure Laterality Date   APPENDECTOMY  1967   CARDIAC CATHETERIZATION Bilateral    cataracts   KYPHOPLASTY N/A 10/31/2016   Procedure: KYPHOPLASTY LUMBAR ONE, LUMBAR TWO AND  LUMBAR THREE;  Surgeon: Venita LickBrooks, Dahari, MD;  Location: [facility] OR;  Service: Orthopedics;  Laterality: N/A;   nsvd  2001   normal vaginal del   sonobysterogram, laporoscopy  1997   fertility test    There were no vitals filed for this visit.    Subjective Assessment - 07/06/20 0813     Subjective About 8 months ago lifted a 30# shop vac and felt a pop. Has to go up and down stairs due to office being in the basement. Standing worse. She has increased weight due to meds and inability to walk. Pain started on the left side and goes her leg into  left calf. She now feels right side down into leg as well. 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 Feels tingling in her lower legs too. Lives with her sister.    Pertinent History compression fx L4/5 and bulging disc, OA, spinal stenosis, OP, DM, kyphoplasty L1-L3/4 2018, HTN, DM,    Limitations Standing;Walking;Lifting    How long can you stand comfortably? 30 seconds    How long can you walk comfortably? not at all    Diagnostic tests MRI/xray    Patient Stated Goals I want to walk to the trash can without stopping; want to walk for exercise    Currently in Pain? Yes    Pain Score 8     Pain Location Back    Pain Orientation Right;Left;Lower    Pain Descriptors / Indicators Sharp    Pain Type Chronic pain    Pain Onset More than a month ago    Pain Frequency Constant    Aggravating Factors  movement    Pain Relieving Factors pillow between knees sometimes    Effect of Pain on Daily Activities very limited  [facility] PT Assessment - 07/06/20 0001       Assessment   Medical Diagnosis lumbar stenosis with neurogenic claudication    Referring Provider (PT) Donalee Citrin MD    Onset Date/Surgical Date 11/29/19    Next MD Visit 08/10/20      Precautions   Precautions Other (comment)    Precaution Comments OP and continuous glucose monitor CGM      Restrictions   Weight Bearing Restrictions No      Balance Screen   Has the patient fallen in the past 6 months No    Has the patient had a decrease in activity level because of a fear of falling?  No    Is the patient reluctant to leave their home because of a fear of falling?  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 [employer] Comments even pelvic landmarks      ROM / Strength   AROM / PROM / Strength AROM;Strength      AROM   Overall AROM Comments Lumbar flex 25%, ext 25%, right SB WFL, Left 50% with pain, Bil rot 50% with pain at end range      Strength   Strength Assessment Site Hip;Knee    Right/Left Hip Right;Left    Right Hip Flexion 4/5    Right Hip Extension 4-/5    Right Hip ABduction 4+/5    Left Hip Flexion 3+/5    Left Hip Extension 3+/5    Left Hip ABduction 2+/5    Right/Left Knee Right;Left    Right Knee Flexion 5/5    Right Knee Extension 5/5    Left Knee Flexion 4+/5    Left Knee Extension 5/5      Palpation   Spinal mobility not assessed due to OP    Palpation comment Bil glute min/med and ITB Left >Right      Special Tests   Other special tests prone lying decreases pain from 7 to 4/10; POE increases pain to 8/10                        Objective measurements completed on examination: See above findings.       [facility] Adult PT Treatment/Exercise - 07/06/20 0001       Modalities   Modalities Electrical Stimulation      Electrical Stimulation   Electrical Stimulation Location low back and gluteals    Electrical Stimulation Action IFC    Electrical Stimulation Parameters x 7 min to tolerance (pt had to leave)    Electrical Stimulation Goals Pain;Tone                    PT Education - 07/06/20 1612     Education Details educated on sitting posture, correct sit to stand and sit<>supine transfers, avoidance of flexion activities  with ADLS; HEP (prone lying and bridging; TENs info    Person(s) Educated Patient    Methods Explanation;Demonstration;Verbal cues;Handout    Comprehension Verbalized understanding;Returned demonstration  PT Short Term Goals - 07/06/20 1630       PT SHORT TERM GOAL #1   Title Assess 5x sit to stand  and TUG and set appropriate LTG    Time 2    Period Weeks    Status New    Target Date  07/20/20               PT Long Term Goals - 07/06/20 1625       PT LONG TERM GOAL #1   Title ind in HEP and progression and able to verbalize and/or demonstrate correct body mechanics and ADL modifications to prevent further injury.    Time 8    Period Weeks    Status New    Target Date 08/31/20      PT LONG TERM GOAL #2   Title Pt to report decreased pain in the low back and LEs by >= 50% with ADLS.    Time 8    Period Weeks    Status New      PT LONG TERM GOAL #3   Title Patient able to tolerate a walking program at least 3x/wk without increase in sx.    Time 8    Period Weeks    Status New      PT LONG TERM GOAL #4   Title Patient able climb stairs with a reciprocal gait pattern and use of UE support for safety only.    Time 8    Period Weeks    Status New      PT LONG TERM GOAL #5   Title improved Bil LE strength to 4+/5 to improve body mechanics and transfers.    Time 8    Period Weeks    Status New                    Plan - 07/06/20 1613     Clinical Impression Statement Patient presents with c/o of low back and radicular sx into bil LE Lt >Rt x 8 months after lifting a 30# shop vac of water. Imaging shows a compression fx and bulging disc at L4/5. Patient has OP and a history of kyphoplasty L1-3. Patient has marked limitations in lumbar ROM and weakness in BLE Lt>Rt. She also has neuropathy in bil feet due to DM which affects her balance. Patient works at [employer]. Her office is downstairs and she must pull on the railing to ascend and use step to gait. She is limited with standing and walking due to pain, but would like to be able to walk for exercise. 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 She lives with her sister on a farm and has difficulty completing chores. She will benefit from PT to address these deficits.    Personal Factors and Comorbidities Comorbidity 3+;Time since onset of injury/illness/exacerbation    Comorbidities compression fx L4/5 and  bulging disc, OA, spinal stenosis, OP, DM, kyphoplasty L1-L3/4 2018, HTN    Examination-Activity Limitations Bend;Carry;Dressing;Locomotion Level;Stairs;Stand    Examination-Participation Restrictions [employer]    Stability/Clinical Decision Making Stable/Uncomplicated    Clinical Decision Making Low    Rehab Potential Good    PT Frequency 2x / week    PT Duration 8 weeks    PT Treatment/Interventions ADLs/Self Care Home Management;Aquatic Therapy;Cryotherapy;Electrical Stimulation;Moist Heat;Stair training;Functional mobility training;Therapeutic activities;Therapeutic exercise;Neuromuscular re-education;Balance training;Patient/family education;Manual techniques;Dry needling    PT Next Visit Plan Assess TUG and 5x sit to stand and set LTG; Review ADL modifications  and body mechanics; assess prone lying and progress to prone on elbows and ext as tolerated; continue with extension based TE (avoid flexion activites due to OP and disc), functional LE strengthening, prone stabilzation; modalities prn.    PT Home Exercise Plan [email or web address] (prone lying, bridging)    Consulted and Agree with Plan of Care Patient             Patient will benefit from skilled therapeutic intervention in order to improve the following deficits and impairments:  Impaired sensation, Pain, Improper body mechanics, Increased muscle spasms, Decreased activity tolerance, Decreased range of motion, Decreased strength, Impaired flexibility, Decreased balance  Visit Diagnosis: Radiculopathy, lumbar region - Plan: PT plan of care cert/re-cert  Muscle weakness (generalized) - Plan: PT plan of care cert/re-cert  Unsteadiness on feet - Plan: PT plan of care cert/re-cert     Problem List Patient Active Problem List   Diagnosis Date Noted   Closed compression fracture of L2 lumbar vertebra 10/31/2016   Hyperlipidemia 09/23/2012   Hypothyroid 09/23/2012   Allergic rhinitis 09/23/2012   History of migraine headaches  09/23/2012   IDDM (insulin dependent diabetes mellitus) 06/26/2012   HTN (hypertension) 06/26/2012    Solon Palm PT 07/06/2020, 4:36 PM  [facility] [address] [place], [place], [address] Phone: [phone]   Fax:  [phone]  Name: AMEA MCPHAIL MRN: [id number] Date of Birth: 1960/06/30

## 2020-07-12 ENCOUNTER — Ambulatory Visit: Payer: 59 | Admitting: Physical Therapy

## 2020-07-12 ENCOUNTER — Encounter: Payer: Self-pay | Admitting: Physical Therapy

## 2020-07-12 ENCOUNTER — Other Ambulatory Visit: Payer: Self-pay

## 2020-07-12 DIAGNOSIS — M6281 Muscle weakness (generalized): Secondary | ICD-10-CM

## 2020-07-12 DIAGNOSIS — M5416 Radiculopathy, lumbar region: Secondary | ICD-10-CM | POA: Diagnosis not present

## 2020-07-12 DIAGNOSIS — R2681 Unsteadiness on feet: Secondary | ICD-10-CM

## 2020-07-12 NOTE — Therapy (Signed)
[facility] [address] [place], [place], [address] Phone: [phone]   Fax:  [phone]  Physical Therapy Treatment  Patient Details  Name: Kayla Berger MRN: [id number] Date of Birth: 1960/12/21 Referring Provider (PT): Donalee Citrin MD   Encounter Date: 07/12/2020   PT End of Session - 07/12/20 0738     Visit Number 2    Number of Visits 16    Date for PT Re-Evaluation 08/31/20    Authorization Type UHC    PT Start Time 0733    PT Stop Time 0820    PT Time Calculation (min) 47 min    Activity Tolerance Patient tolerated treatment well    Behavior During Therapy [facility] for tasks assessed/performed             Past Medical History:  Diagnosis Date   Arthritis    Complication of anesthesia    "hard to awaken"   Dysrhythmia    irregular heartbeat- rx   History of kidney stones    Hypertension    Hypothyroidism    synthroid in past no med now   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   Migraines    occ   PCOS (polycystic ovarian syndrome) 1986   Shoulder pain    Thyroid disease    Trochanteric bursitis of left hip    Vaginal cysts    4 have been removed    Past Surgical History:  Procedure Laterality Date   APPENDECTOMY  1967   CARDIAC CATHETERIZATION Bilateral    cataracts   KYPHOPLASTY N/A 10/31/2016   Procedure: KYPHOPLASTY LUMBAR ONE, LUMBAR TWO AND  LUMBAR THREE;  Surgeon: Venita Lick, MD;  Location: [facility] OR;  Service: Orthopedics;  Laterality: N/A;   nsvd  2001   normal vaginal del   sonobysterogram, laporoscopy  1997   fertility test    There were no vitals filed for this visit.   Subjective Assessment - 07/12/20 0732     Subjective Reports some stiffness. Having an injection in low back 07/24/2020. Reports walking is more 9/10.    Pertinent History compression fx L4/5 and bulging disc, OA, spinal stenosis, OP, DM, kyphoplasty L1-L3/4 2018, HTN, DM,    Limitations  Standing;Walking;Lifting    How long can you stand comfortably? 30 seconds    How long can you walk comfortably? not at all    Diagnostic tests MRI/xray    Patient Stated Goals I want to walk to the trash can without stopping; want to walk for exercise    Currently in Pain? Yes    Pain Score 6     Pain Location Back    Pain Orientation Lower    Pain Descriptors / Indicators Other (Comment)   stiffness   Pain Type Chronic pain    Pain Onset More than a month ago    Pain Frequency Constant                [facility] PT Assessment - 07/12/20 0001       Assessment   Medical Diagnosis lumbar stenosis with neurogenic claudication    Referring Provider (PT) Donalee Citrin MD    Onset Date/Surgical Date 11/29/19    Next MD Visit 08/10/20      Precautions   Precautions Other (comment)    Precaution Comments OP and continuous glucose monitor CGM      Restrictions   Weight Bearing Restrictions No  [facility] Adult PT Treatment/Exercise - 07/12/20 0001       Standardized Balance Assessment   Standardized Balance Assessment Timed Up and Go Test      Timed Up and Go Test   TUG Normal TUG    Normal TUG (seconds) 26      Exercises   Exercises Lumbar;Knee/Hip      Lumbar Exercises: Stretches   Active Hamstring Stretch Right;Left;2 reps;20 seconds    Single Knee to Chest Stretch Right;Left;2 reps;30 seconds    Figure 4 Stretch 2 reps;20 seconds;Seated;Without overpressure    Figure 4 Stretch Limitations BLE      Lumbar Exercises: Seated   Sit to Stand 5 reps   26 sec sit to stand assessment     Lumbar Exercises: Prone   Other Prone Lumbar Exercises Prone position x3 min      Modalities   Modalities Electrical Stimulation;Moist Heat      Moist Heat Therapy   Number Minutes Moist Heat 10 Minutes    Moist Heat Location Lumbar Spine      Electrical Stimulation   Electrical Stimulation Location B low back    Electrical Stimulation Action  Pre-Mod    Electrical Stimulation Parameters 80-150 hz x10 min    Electrical Stimulation Goals Pain;Tone                      PT Short Term Goals - 07/06/20 1630       PT SHORT TERM GOAL #1   Title Assess 5x sit to stand  and TUG and set appropriate LTG    Time 2    Period Weeks    Status New    Target Date 07/20/20               PT Long Term Goals - 07/06/20 1625       PT LONG TERM GOAL #1   Title ind in HEP and progression and able to verbalize and/or demonstrate correct body mechanics and ADL modifications to prevent further injury.    Time 8    Period Weeks    Status New    Target Date 08/31/20      PT LONG TERM GOAL #2   Title Pt to report decreased pain in the low back and LEs by >= 50% with ADLS.    Time 8    Period Weeks    Status New      PT LONG TERM GOAL #3   Title Patient able to tolerate a walking program at least 3x/wk without increase in sx.    Time 8    Period Weeks    Status New      PT LONG TERM GOAL #4   Title Patient able climb stairs with a reciprocal gait pattern and use of UE support for safety only.    Time 8    Period Weeks    Status New      PT LONG TERM GOAL #5   Title improved Bil LE strength to 4+/5 to improve body mechanics and transfers.    Time 8    Period Weeks    Status New                   Plan - 07/12/20 0813     Clinical Impression Statement Patient presented in clinic with gait deviations due to pain. Patient more compensatory regarding LLE due to more radicular pain in LLE. Patient very guarded with any movement. TUG  and sit<> stands assessment completed at 26 seconds each. All stretching completed in sitting and modified due to pain. More pain with LLE stretching per patient report. Patient sleeping however is comfortable which is mostly sidelying and supine. Patient compliant with log rolling for mobility. Provided new HEP for the same exercises as evaluation as email was not sent. Patient  understanding of instruction for HEP parameters.    Personal Factors and Comorbidities Comorbidity 3+;Time since onset of injury/illness/exacerbation    Comorbidities compression fx L4/5 and bulging disc, OA, spinal stenosis, OP, DM, kyphoplasty L1-L3/4 2018, HTN    Examination-Activity Limitations Bend;Carry;Dressing;Locomotion Level;Stairs;Stand    Examination-Participation Restrictions [employer]    Stability/Clinical Decision Making Stable/Uncomplicated    Rehab Potential Good    PT Frequency 2x / week    PT Duration 8 weeks    PT Treatment/Interventions ADLs/Self Care Home Management;Aquatic Therapy;Cryotherapy;Electrical Stimulation;Moist Heat;Stair training;Functional mobility training;Therapeutic activities;Therapeutic exercise;Neuromuscular re-education;Balance training;Patient/family education;Manual techniques;Dry needling    PT Next Visit Plan Review ADL modifications and body mechanics; assess prone lying and progress to prone on elbows and ext as tolerated; continue with extension based TE (avoid flexion activites due to OP and disc), functional LE strengthening, prone stabilzation; modalities prn.    PT Home Exercise Plan [id number] (prone lying, bridging)    Consulted and Agree with Plan of Care Patient             Patient will benefit from skilled therapeutic intervention in order to improve the following deficits and impairments:  Impaired sensation, Pain, Improper body mechanics, Increased muscle spasms, Decreased activity tolerance, Decreased range of motion, Decreased strength, Impaired flexibility, Decreased balance  Visit Diagnosis: Radiculopathy, lumbar region  Muscle weakness (generalized)  Unsteadiness on feet     Problem List Patient Active Problem List   Diagnosis Date Noted   Closed compression fracture of L2 lumbar vertebra 10/31/2016   Hyperlipidemia 09/23/2012   Hypothyroid 09/23/2012   Allergic rhinitis 09/23/2012   History of migraine  headaches 09/23/2012   IDDM (insulin dependent diabetes mellitus) 06/26/2012   HTN (hypertension) 06/26/2012    Marvell Fuller, PTA 07/12/2020, 8:44 AM  [facility] [address] [place], [place], [address] Phone: [phone]   Fax:  [phone]  Name: Kayla Berger MRN: [id number] Date of Birth: 01-03-1961

## 2020-07-14 ENCOUNTER — Ambulatory Visit: Payer: 59 | Admitting: Physical Therapy

## 2020-07-14 ENCOUNTER — Encounter: Payer: Self-pay | Admitting: Physical Therapy

## 2020-07-14 ENCOUNTER — Other Ambulatory Visit: Payer: Self-pay

## 2020-07-14 DIAGNOSIS — M6281 Muscle weakness (generalized): Secondary | ICD-10-CM

## 2020-07-14 DIAGNOSIS — M5416 Radiculopathy, lumbar region: Secondary | ICD-10-CM | POA: Diagnosis not present

## 2020-07-14 DIAGNOSIS — R2681 Unsteadiness on feet: Secondary | ICD-10-CM

## 2020-07-14 NOTE — Therapy (Signed)
[facility] [address] [place], [place], [address] Phone: [phone]   Fax:  [phone]  Physical Therapy Treatment  Patient Details  Name: Kayla Berger MRN: [id number] Date of Birth: 11-Jan-1961 Referring Provider (PT): Donalee Citrin MD   Encounter Date: 07/14/2020   PT End of Session - 07/14/20 1213     Visit Number 3    Number of Visits 16    Date for PT Re-Evaluation 08/31/20    Authorization Type UHC    PT Start Time 0730    PT Stop Time 0825    PT Time Calculation (min) 55 min    Activity Tolerance Patient tolerated treatment well    Behavior During Therapy [facility] for tasks assessed/performed             Past Medical History:  Diagnosis Date   Arthritis    Complication of anesthesia    "hard to awaken"   Dysrhythmia    irregular heartbeat- rx   History of kidney stones    Hypertension    Hypothyroidism    synthroid in past no med now   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   Migraines    occ   PCOS (polycystic ovarian syndrome) 1986   Shoulder pain    Thyroid disease    Trochanteric bursitis of left hip    Vaginal cysts    4 have been removed    Past Surgical History:  Procedure Laterality Date   APPENDECTOMY  1967   CARDIAC CATHETERIZATION Bilateral    cataracts   KYPHOPLASTY N/A 10/31/2016   Procedure: KYPHOPLASTY LUMBAR ONE, LUMBAR TWO AND  LUMBAR THREE;  Surgeon: Venita Lick, MD;  Location: [facility];  Service: Orthopedics;  Laterality: N/A;   nsvd  2001   normal vaginal del   sonobysterogram, laporoscopy  1997   fertility test    There were no vitals filed for this visit.   Subjective Assessment - 07/14/20 1213     Subjective COVID-19 screen performed prior to patient entering clinic.  In a lot of pain today.    Pertinent History compression fx L4/5 and bulging disc, OA, spinal stenosis, OP, DM, kyphoplasty L1-L3/4 2018, HTN, DM,    How long can you stand comfortably? 30  seconds    How long can you walk comfortably? not at all    Diagnostic tests MRI/xray    Patient Stated Goals I want to walk to the trash can without stopping; want to walk for exercise    Currently in Pain? Yes    Pain Score 8     Pain Location Back    Pain Orientation Lower    Pain Descriptors / Indicators Aching;Throbbing   Stiff.   Pain Type Chronic pain                               [facility] Adult PT Treatment/Exercise - 07/14/20 0001       Modalities   Modalities Electrical Stimulation;Moist Heat;Ultrasound      Moist Heat Therapy   Number Minutes Moist Heat 20 Minutes    Moist Heat Location Lumbar Spine      Electrical Stimulation   Electrical Stimulation Location Bil LB    Electrical Stimulation Action IFC at 80-150 Hz.    Electrical Stimulation Parameters 40% scan x 20 minutes.    Electrical Stimulation Goals Pain;Tone      Ultrasound  Ultrasound Location Bil lower lumbar.    Ultrasound Parameters Combo e'stim/US at 1.50 W/CM2 x 12 minutes.    Ultrasound Goals Pain      Manual Therapy   Manual Therapy Soft tissue mobilization    Soft tissue mobilization In left sdly position with pillow between knees for comfort:  STW/M x 12 minutes to patient's lower lumbar musculature.                      PT Short Term Goals - 07/06/20 1630       PT SHORT TERM GOAL #1   Title Assess 5x sit to stand  and TUG and set appropriate LTG    Time 2    Period Weeks    Status New    Target Date 07/20/20               PT Long Term Goals - 07/06/20 1625       PT LONG TERM GOAL #1   Title ind in HEP and progression and able to verbalize and/or demonstrate correct body mechanics and ADL modifications to prevent further injury.    Time 8    Period Weeks    Status New    Target Date 08/31/20      PT LONG TERM GOAL #2   Title Pt to report decreased pain in the low back and LEs by >= 50% with ADLS.    Time 8    Period Weeks    Status New       PT LONG TERM GOAL #3   Title Patient able to tolerate a walking program at least 3x/wk without increase in sx.    Time 8    Period Weeks    Status New      PT LONG TERM GOAL #4   Title Patient able climb stairs with a reciprocal gait pattern and use of UE support for safety only.    Time 8    Period Weeks    Status New      PT LONG TERM GOAL #5   Title improved Bil LE strength to 4+/5 to improve body mechanics and transfers.    Time 8    Period Weeks    Status New                   Plan - 07/14/20 1222     Clinical Impression Statement Patient with a high pain-level today and antalgic gait today.   Conservative treatment performed to patient's low back today.  She tolerated well without complaint.    Personal Factors and Comorbidities Comorbidity 3+;Time since onset of injury/illness/exacerbation    Comorbidities compression fx L4/5 and bulging disc, OA, spinal stenosis, OP, DM, kyphoplasty L1-L3/4 2018, HTN    Examination-Activity Limitations Bend;Carry;Dressing;Locomotion Level;Stairs;Stand    Examination-Participation Restrictions [employer]    Stability/Clinical Decision Making Stable/Uncomplicated    Clinical Decision Making Low    Rehab Potential Good    PT Frequency 2x / week    PT Duration 8 weeks    PT Treatment/Interventions ADLs/Self Care Home Management;Aquatic Therapy;Cryotherapy;Electrical Stimulation;Moist Heat;Stair training;Functional mobility training;Therapeutic activities;Therapeutic exercise;Neuromuscular re-education;Balance training;Patient/family education;Manual techniques;Dry needling    PT Next Visit Plan Review ADL modifications and body mechanics; assess prone lying and progress to prone on elbows and ext as tolerated; continue with extension based TE (avoid flexion activites due to OP and disc), functional LE strengthening, prone stabilzation; modalities prn.    Consulted and Agree with Plan  of Care Patient              Patient will benefit from skilled therapeutic intervention in order to improve the following deficits and impairments:  Impaired sensation, Pain, Improper body mechanics, Increased muscle spasms, Decreased activity tolerance, Decreased range of motion, Decreased strength, Impaired flexibility, Decreased balance  Visit Diagnosis: Radiculopathy, lumbar region  Muscle weakness (generalized)  Unsteadiness on feet     Problem List Patient Active Problem List   Diagnosis Date Noted   Closed compression fracture of L2 lumbar vertebra 10/31/2016   Hyperlipidemia 09/23/2012   Hypothyroid 09/23/2012   Allergic rhinitis 09/23/2012   History of migraine headaches 09/23/2012   IDDM (insulin dependent diabetes mellitus) 06/26/2012   HTN (hypertension) 06/26/2012    Stanton Kissoon, [place] MPT 07/14/2020, 12:27 PM  [facility] [address] [place], [place], [address] Phone: [phone]   Fax:  [phone]  Name: Kayla Berger MRN: [id number] Date of Birth: [birth year]

## 2020-07-21 ENCOUNTER — Ambulatory Visit: Payer: 59 | Admitting: Physical Therapy

## 2020-07-21 ENCOUNTER — Other Ambulatory Visit: Payer: Self-pay

## 2020-07-21 DIAGNOSIS — R2681 Unsteadiness on feet: Secondary | ICD-10-CM

## 2020-07-21 DIAGNOSIS — M5416 Radiculopathy, lumbar region: Secondary | ICD-10-CM | POA: Diagnosis not present

## 2020-07-21 DIAGNOSIS — M6281 Muscle weakness (generalized): Secondary | ICD-10-CM

## 2020-07-21 NOTE — Therapy (Signed)
[facility] [place], [place], [address] Phone: [phone]   Fax:  [phone]  Physical Therapy Treatment  Patient Details  Name: Kayla Berger MRN: [id number] Date of Birth: October 23, 1960 Referring Provider (PT): Kary Kos MD   Encounter Date: 07/21/2020   PT End of Session - 07/21/20 0821     Visit Number 4    Number of Visits 16    Date for PT Re-Evaluation 08/31/20    Authorization Type UHC    PT Start Time 0730    PT Stop Time 0821    PT Time Calculation (min) 51 min    Activity Tolerance Patient tolerated treatment well;Patient limited by pain    Behavior During Therapy [facility] for tasks assessed/performed             Past Medical History:  Diagnosis Date   Arthritis    Complication of anesthesia    "hard to awaken"   Dysrhythmia    irregular heartbeat- rx   History of kidney stones    Hypertension    Hypothyroidism    synthroid in past no med now   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   Migraines    occ   PCOS (polycystic ovarian syndrome) 1986   Shoulder pain    Thyroid disease    Trochanteric bursitis of left hip    Vaginal cysts    4 have been removed    Past Surgical History:  Procedure Laterality Date   APPENDECTOMY  1967   CARDIAC CATHETERIZATION Bilateral    cataracts   KYPHOPLASTY N/A 10/31/2016   Procedure: KYPHOPLASTY LUMBAR ONE, LUMBAR TWO AND  LUMBAR THREE;  Surgeon: Melina Schools, MD;  Location: [facility];  Service: Orthopedics;  Laterality: N/A;   nsvd  2001   normal vaginal del   sonobysterogram, laporoscopy  1997   fertility test    There were no vitals filed for this visit.   Subjective Assessment - 07/21/20 0736     Subjective COVID-19 screen performed prior to patient entering clinic.  Patient reported some ongoing pain    Pertinent History compression fx L4/5 and bulging disc, OA, spinal stenosis, OP, DM, kyphoplasty L1-L3/4 2018, HTN, DM,     Limitations Standing;Walking;Lifting    How long can you stand comfortably? 30 seconds    How long can you walk comfortably? not at all    Diagnostic tests MRI/xray    Patient Stated Goals I want to walk to the trash can without stopping; want to walk for exercise    Currently in Pain? Yes    Pain Score 8     Pain Location Back    Pain Orientation Lower    Pain Descriptors / Indicators Aching;Discomfort    Pain Type Chronic pain    Pain Onset More than a month ago    Pain Frequency Constant    Aggravating Factors  prolong movement    Pain Relieving Factors rest                               [facility] Adult PT Treatment/Exercise - 07/21/20 0001       Lumbar Exercises: Stretches   Other Lumbar Stretch Exercise standing lumbar ext stretch x5      Lumbar Exercises: Aerobic   Nustep L2 18mn 22sec      Lumbar Exercises: Supine   Ab Set 20 reps;5 seconds  Glut Set 20 reps;3 seconds    Clam 20 reps;3 seconds    Straight Leg Raise 2 seconds   2x5-10   Other Supine Lumbar Exercises Supin abdominal brace with toe and heel lift 2x10      Moist Heat Therapy   Number Minutes Moist Heat 15 Minutes    Moist Heat Location Lumbar Spine      Electrical Stimulation   Electrical Stimulation Location Bil LB    Electrical Stimulation Action IFC    Electrical Stimulation Parameters 80-_0  x70mn    Electrical Stimulation Goals Pain;Tone                    PT Education - 07/21/20 0949 [id number]    Education Details HEP abdominal bracing and posture awareness techniques    Person(s) Educated Patient    Methods Explanation;Demonstration;Handout    Comprehension Verbalized understanding;Returned demonstration              PT Short Term Goals - 07/21/20 0740       PT SHORT TERM GOAL #1   Title Assess 5x sit to stand  and TUG and set appropriate LTG    Baseline Normal per 07/12/20    Time 2    Period Weeks    Status Achieved               PT Long Term  Goals - 07/21/20 0740       PT LONG TERM GOAL #1   Title ind in HEP and progression and able to verbalize and/or demonstrate correct body mechanics and ADL modifications to prevent further injury.    Baseline issued today 07/21/20    Time 8    Period Weeks    Status Achieved      PT LONG TERM GOAL #2   Title Pt to report decreased pain in the low back and LEs by >= 50% with ADLS.    Baseline 8/10 today 07/21/20    Time 8    Period Weeks    Status On-going      PT LONG TERM GOAL #3   Title Patient able to tolerate a walking program at least 3x/wk without increase in sx.    Baseline no walking at this time 07/21/20    Time 8    Period Weeks    Status On-going      PT LONG TERM GOAL #4   Title Patient able climb stairs with a reciprocal gait pattern and use of UE support for safety only.    Time 8    Period Weeks    Status On-going      PT LONG TERM GOAL #5   Title improved Bil LE strength to 4+/5 to improve body mechanics and transfers.    Time 8    Period Weeks    Status On-going                   Plan - 07/21/20 0816     Clinical Impression Statement Patient tolerated treatment fair due to ongoing pain in back. Patient able to progress with abdominal bracing today yet low level due to pain. Patient educated on posture awareness techniques, abdominal bracing and slow progression with HEP provided. Patient very limited with prolong activity and movements. Met STG #1 and LTG#1 with remaing progressing due to pain limitations. Patient is to get injection next monday.    Personal Factors and Comorbidities Comorbidity 3+;Time since onset of injury/illness/exacerbation    Comorbidities compression  fx L4/5 and bulging disc, OA, spinal stenosis, OP, DM, kyphoplasty L1-L3/4 2018, HTN    Examination-Activity Limitations Bend;Carry;Dressing;Locomotion Level;Stairs;Stand    Examination-Participation Restrictions [employer]    Stability/Clinical Decision Making  Stable/Uncomplicated    Rehab Potential Good    PT Frequency 2x / week    PT Duration 8 weeks    PT Treatment/Interventions ADLs/Self Care Home Management;Aquatic Therapy;Cryotherapy;Electrical Stimulation;Moist Heat;Stair training;Functional mobility training;Therapeutic activities;Therapeutic exercise;Neuromuscular re-education;Balance training;Patient/family education;Manual techniques;Dry needling    PT Next Visit Plan assess and  continue with extension based TE (avoid flexion activites due to OP and disc), functional LE strengthening, prone stabilzation; modalities prn.    Consulted and Agree with Plan of Care Patient             Patient will benefit from skilled therapeutic intervention in order to improve the following deficits and impairments:  Impaired sensation, Pain, Improper body mechanics, Increased muscle spasms, Decreased activity tolerance, Decreased range of motion, Decreased strength, Impaired flexibility, Decreased balance  Visit Diagnosis: Radiculopathy, lumbar region  Muscle weakness (generalized)  Unsteadiness on feet     Problem List Patient Active Problem List   Diagnosis Date Noted   Closed compression fracture of L2 lumbar vertebra 10/31/2016   Hyperlipidemia 09/23/2012   Hypothyroid 09/23/2012   Allergic rhinitis 09/23/2012   History of migraine headaches 09/23/2012   IDDM (insulin dependent diabetes mellitus) 06/26/2012   HTN (hypertension) 06/26/2012    Sue Mcalexander P, PTA 07/21/2020, 8:31 AM  [facility] [address] [place], [place], [address] Phone: [phone]   Fax:  [phone]  Name: MANUEL LAWHEAD MRN: [id number] Date of Birth: [birth year]

## 2020-07-21 NOTE — Patient Instructions (Signed)
Pelvic Tilt: Posterior - Legs Bent (Supine)  Tighten stomach and buttock and flatten back by rolling pelvis down. Hold _10___ seconds. Relax. Repeat _10-30___ times per set. Do __2__ sets per session. Do _2___ sessions per day.   Bent Leg Lift (Hook-Lying)  Tighten stomach and buttock and slowly raise right leg _5___ inches from floor. Keep trunk rigid. Hold _3___ seconds. Repeat _10___ times per set. Do ___2-3_ sets per session. Do __2__ sessions per day.   Heel Raise (Sitting)    Raise heels, keeping toes on floor.  Tighten abdomin and buttock (draw in) Repeat __10__ times per set. Do __1-2__ sets per session. Do __1-2__ sessions per day.   FLEXION: Sitting (Active)    Sit, both feet flat. Lift right knee toward ceiling. Tighten abdomin and buttock (draw in) Complete _10__ sets of _1-2__ repetitions. Perform _1-2__ sessions per day.  Brushing Teeth    Place one foot on ledge and one hand on counter. Bend other knee slightly to keep back straight.  Copyright  VHI. All rights reserved.  Refrigerator   Squat with knees apart to reach lower shelves and drawers.   Copyright  VHI. All rights reserved.  Laundry Basket   Squat down and hold basket close to stand. Use leg muscles to do the work.   Copyright  VHI. All rights reserved.  Housework - Vacuuming   Hold the vacuum with arm held at side. Step back and forth to move it, keeping head up. Avoid twisting.   Copyright  VHI. All rights reserved.  Housework - Wiping   Position yourself as close as possible to reach work surface. Avoid straining your back.   Copyright  VHI. All rights reserved.  Gardening - Mowing   Keep arms close to sides and walk with lawn mower.   Copyright  VHI. All rights reserved.  Sleeping on Side   Place pillow between knees. Use cervical support under neck and a roll around waist as needed.   Copyright  VHI. All rights reserved.  Log Roll   Lying on back, bend  left knee and place left arm across chest. Roll all in one movement to the right. Reverse to roll to the left. Always move as one unit.   Copyright  VHI. All rights reserved.  Stand to Sit / Sit to Stand   To sit: Bend knees to lower self onto front edge of chair, then scoot back on seat. To stand: Reverse sequence by placing one foot forward, and scoot to front of seat. Use rocking motion to stand up.  Copyright  VHI. All rights reserved.  Posture - Standing   Good posture is important. Avoid slouching and forward head thrust. Maintain curve in low back and align ears over shoul- ders, hips over ankles.   Copyright  VHI. All rights reserved.  Posture - Sitting   Sit upright, head facing forward. Try using a roll to support lower back. Keep shoulders relaxed, and avoid rounded back. Keep hips level with knees. Avoid crossing legs for long periods.   Copyright  VHI. All rights reserved.  Computer Work   Position work to face forward. Use proper work and seat height. Keep shoulders back and down, wrists straight, and elbows at right angles. Use chair that provides full back support. Add footrest and lumbar roll as needed.   Copyright  VHI. All rights reserved.    

## 2020-07-27 ENCOUNTER — Encounter: Payer: Self-pay | Admitting: Physical Therapy

## 2020-07-27 ENCOUNTER — Ambulatory Visit: Payer: 59 | Admitting: Physical Therapy

## 2020-07-27 ENCOUNTER — Other Ambulatory Visit: Payer: Self-pay

## 2020-07-27 DIAGNOSIS — M5416 Radiculopathy, lumbar region: Secondary | ICD-10-CM

## 2020-07-27 DIAGNOSIS — R2681 Unsteadiness on feet: Secondary | ICD-10-CM

## 2020-07-27 DIAGNOSIS — M6281 Muscle weakness (generalized): Secondary | ICD-10-CM

## 2020-07-27 NOTE — Therapy (Signed)
[facility] [address] [place], [place], [address] Phone: [phone]   Fax:  [phone]  Physical Therapy Treatment  Patient Details  Name: Kayla Berger MRN: [id number] Date of Birth: 09/28/1960 Referring Provider (PT): Donalee Citrin MD   Encounter Date: 07/27/2020   PT End of Session - 07/27/20 0841     Visit Number 5    Number of Visits 16    Date for PT Re-Evaluation 08/31/20    Authorization Type UHC    PT Start Time 0731    PT Stop Time 0815    PT Time Calculation (min) 44 min    Activity Tolerance Patient tolerated treatment well    Behavior During Therapy [facility] for tasks assessed/performed             Past Medical History:  Diagnosis Date   Arthritis    Complication of anesthesia    "hard to awaken"   Dysrhythmia    irregular heartbeat- rx   History of kidney stones    Hypertension    Hypothyroidism    synthroid in past no med now   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   Migraines    occ   PCOS (polycystic ovarian syndrome) 1986   Shoulder pain    Thyroid disease    Trochanteric bursitis of left hip    Vaginal cysts    4 have been removed    Past Surgical History:  Procedure Laterality Date   APPENDECTOMY  1967   CARDIAC CATHETERIZATION Bilateral    cataracts   KYPHOPLASTY N/A 10/31/2016   Procedure: KYPHOPLASTY LUMBAR ONE, LUMBAR TWO AND  LUMBAR THREE;  Surgeon: Venita Lick, MD;  Location: [facility] OR;  Service: Orthopedics;  Laterality: N/A;   nsvd  2001   normal vaginal del   sonobysterogram, laporoscopy  1997   fertility test    There were no vitals filed for this visit.   Subjective Assessment - 07/27/20 0730     Subjective COVID-19 screen performed prior to patient entering clinic. Reports she recieved two injections in low back on Monday. States that she has gotten some relief from L side injection but may have missed exact location on R side. Patient reports that she  is sleeping some better and noticing some areas of less pain such as laying on her side. Able to ascend stairs recipricaly.    Pertinent History compression fx L4/5 and bulging disc, OA, spinal stenosis, OP, DM, kyphoplasty L1-L3/4 2018, HTN, DM,    Limitations Standing;Walking;Lifting    How long can you stand comfortably? 30 seconds    How long can you walk comfortably? not at all    Diagnostic tests MRI/xray    Patient Stated Goals I want to walk to the trash can without stopping; want to walk for exercise    Currently in Pain? Yes    Pain Score --   No numerical value provided   Pain Location Back    Pain Orientation Right    Pain Descriptors / Indicators Discomfort    Pain Type Chronic pain    Pain Onset More than a month ago    Pain Frequency Constant                [facility] PT Assessment - 07/27/20 0001       Assessment   Medical Diagnosis lumbar stenosis with neurogenic claudication    Referring Provider (PT) Donalee Citrin MD    Onset Date/Surgical  Date 11/29/19    Next MD Visit 08/10/20      Precautions   Precautions Other (comment)    Precaution Comments OP and continuous glucose monitor CGM                           [facility] Adult PT Treatment/Exercise - 07/27/20 0001       Lumbar Exercises: Stretches   Lower Trunk Rotation 5 reps;10 seconds    Figure 4 Stretch 3 reps;30 seconds;Seated;With overpressure      Lumbar Exercises: Aerobic   Nustep L3 x7 min      Lumbar Exercises: Supine   Bridge with clamshell 15 reps;5 seconds;Limitations    Bridge with Ball Squeeze Limitations yellow theraband      Lumbar Exercises: Prone   Other Prone Lumbar Exercises POE x3 min    Other Prone Lumbar Exercises POE with roadkill BLE x3 min each      Modalities   Modalities Electrical Stimulation;Moist Heat;Ultrasound      Moist Heat Therapy   Number Minutes Moist Heat 10 Minutes    Moist Heat Location Lumbar Spine      Electrical Stimulation   Electrical  Stimulation Location Bil LB    Electrical Stimulation Action IFC    Electrical Stimulation Parameters 80-150 hz x10 min    Electrical Stimulation Goals Pain;Tone                      PT Short Term Goals - 07/21/20 0740       PT SHORT TERM GOAL #1   Title Assess 5x sit to stand  and TUG and set appropriate LTG    Baseline Normal per 07/12/20    Time 2    Period Weeks    Status Achieved               PT Long Term Goals - 07/21/20 0740       PT LONG TERM GOAL #1   Title ind in HEP and progression and able to verbalize and/or demonstrate correct body mechanics and ADL modifications to prevent further injury.    Baseline issued today 07/21/20    Time 8    Period Weeks    Status Achieved      PT LONG TERM GOAL #2   Title Pt to report decreased pain in the low back and LEs by >= 50% with ADLS.    Baseline 8/10 today 07/21/20    Time 8    Period Weeks    Status On-going      PT LONG TERM GOAL #3   Title Patient able to tolerate a walking program at least 3x/wk without increase in sx.    Baseline no walking at this time 07/21/20    Time 8    Period Weeks    Status On-going      PT LONG TERM GOAL #4   Title Patient able climb stairs with a reciprocal gait pattern and use of UE support for safety only.    Time 8    Period Weeks    Status On-going      PT LONG TERM GOAL #5   Title improved Bil LE strength to 4+/5 to improve body mechanics and transfers.    Time 8    Period Weeks    Status On-going                   Plan - 07/27/20  9735     Clinical Impression Statement Patient presented in clinic with reports of less LBP after injections especially in L low back. Patient still moving very slow and cautious due to guarding and pain. Patient slower with transitions as well due to avoidance of pain. Patient is compliant with log rolling. Patient reported relief of muscle tightness after LTR. Greater L hip ROM with prone L roadkill. Normal modalities  response noted following removal of the modalities.    Personal Factors and Comorbidities Comorbidity 3+;Time since onset of injury/illness/exacerbation    Comorbidities compression fx L4/5 and bulging disc, OA, spinal stenosis, OP, DM, kyphoplasty L1-L3/4 2018, HTN    Examination-Activity Limitations Bend;Carry;Dressing;Locomotion Level;Stairs;Stand    Examination-Participation Restrictions [employer]    Stability/Clinical Decision Making Stable/Uncomplicated    Rehab Potential Good    PT Frequency 2x / week    PT Duration 8 weeks    PT Treatment/Interventions ADLs/Self Care Home Management;Aquatic Therapy;Cryotherapy;Electrical Stimulation;Moist Heat;Stair training;Functional mobility training;Therapeutic activities;Therapeutic exercise;Neuromuscular re-education;Balance training;Patient/family education;Manual techniques;Dry needling    PT Next Visit Plan assess and  continue with extension based TE (avoid flexion activites due to OP and disc), functional LE strengthening, prone stabilzation; modalities prn.    PT Home Exercise Plan [id number] (prone lying, bridging)    Consulted and Agree with Plan of Care Patient             Patient will benefit from skilled therapeutic intervention in order to improve the following deficits and impairments:  Impaired sensation, Pain, Improper body mechanics, Increased muscle spasms, Decreased activity tolerance, Decreased range of motion, Decreased strength, Impaired flexibility, Decreased balance  Visit Diagnosis: Radiculopathy, lumbar region  Muscle weakness (generalized)  Unsteadiness on feet     Problem List Patient Active Problem List   Diagnosis Date Noted   Closed compression fracture of L2 lumbar vertebra 10/31/2016   Hyperlipidemia 09/23/2012   Hypothyroid 09/23/2012   Allergic rhinitis 09/23/2012   History of migraine headaches 09/23/2012   IDDM (insulin dependent diabetes mellitus) 06/26/2012   HTN (hypertension)  06/26/2012    Marvell Fuller, PTA 07/27/2020, 8:53 AM  [facility] [address] [place], [place], [address] Phone: [phone]   Fax:  [phone]  Name: NATLIE ASFOUR MRN: [id number] Date of Birth: 1960/02/24

## 2020-08-02 ENCOUNTER — Ambulatory Visit: Payer: 59 | Attending: Neurosurgery | Admitting: Physical Therapy

## 2020-08-02 ENCOUNTER — Other Ambulatory Visit: Payer: Self-pay

## 2020-08-02 DIAGNOSIS — R2681 Unsteadiness on feet: Secondary | ICD-10-CM | POA: Insufficient documentation

## 2020-08-02 DIAGNOSIS — M5416 Radiculopathy, lumbar region: Secondary | ICD-10-CM | POA: Diagnosis not present

## 2020-08-02 DIAGNOSIS — M6281 Muscle weakness (generalized): Secondary | ICD-10-CM | POA: Diagnosis present

## 2020-08-02 NOTE — Therapy (Signed)
[facility] [address] [place], [place], [address] Phone: [phone]   Fax:  [phone]  Physical Therapy Treatment  Patient Details  Name: Kayla Berger MRN: [id number] Date of Birth: 11-May-1960 Referring Provider (PT): Donalee Citrin MD   Encounter Date: 08/02/2020   PT End of Session - 08/02/20 0734     Visit Number 6    Number of Visits 16    Date for PT Re-Evaluation 08/31/20    Authorization Type UHC    PT Start Time 0730    PT Stop Time 0822    PT Time Calculation (min) 52 min    Activity Tolerance Patient tolerated treatment well    Behavior During Therapy [facility] for tasks assessed/performed             Past Medical History:  Diagnosis Date   Arthritis    Complication of anesthesia    "hard to awaken"   Dysrhythmia    irregular heartbeat- rx   History of kidney stones    Hypertension    Hypothyroidism    synthroid in past no med now   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   Migraines    occ   PCOS (polycystic ovarian syndrome) 1986   Shoulder pain    Thyroid disease    Trochanteric bursitis of left hip    Vaginal cysts    4 have been removed    Past Surgical History:  Procedure Laterality Date   APPENDECTOMY  1967   CARDIAC CATHETERIZATION Bilateral    cataracts   KYPHOPLASTY N/A 10/31/2016   Procedure: KYPHOPLASTY LUMBAR ONE, LUMBAR TWO AND  LUMBAR THREE;  Surgeon: Venita Lick, MD;  Location: [facility] OR;  Service: Orthopedics;  Laterality: N/A;   nsvd  2001   normal vaginal del   sonobysterogram, laporoscopy  1997   fertility test    There were no vitals filed for this visit.   Subjective Assessment - 08/02/20 0732     Subjective COVID-19 screen performed prior to patient entering clinic. Patient arrived with some ongoing pain, shot helped a little more on left than right    Pertinent History compression fx L4/5 and bulging disc, OA, spinal stenosis, OP, DM, kyphoplasty L1-L3/4  2018, HTN, DM,    Limitations Standing;Walking;Lifting    How long can you stand comfortably? 30 seconds    How long can you walk comfortably? not at all    Diagnostic tests MRI/xray    Patient Stated Goals I want to walk to the trash can without stopping; want to walk for exercise    Currently in Pain? Yes    Pain Score 5     Pain Location Back    Pain Orientation Right    Pain Descriptors / Indicators Discomfort    Pain Type Chronic pain    Pain Onset More than a month ago    Pain Frequency Constant    Aggravating Factors  prolong activity/movements    Pain Relieving Factors at rest                               [facility] Adult PT Treatment/Exercise - 08/02/20 0001       Lumbar Exercises: Stretches   Quad Stretch 3 reps;Right;Left;20 seconds   sidelying     Lumbar Exercises: Aerobic   Nustep L2 x74min UE/LE SPM 90      Lumbar Exercises: Standing  Shoulder Extension Strengthening;Both;20 reps;Theraband    Theraband Level (Shoulder Extension) Level 4 (Blue);Other (comment)    Shoulder Extension Limitations XTS      Lumbar Exercises: Supine   Clam 20 reps;3 seconds   red band   Bridge 15 reps    Straight Leg Raise 2 seconds   2x10 w abd bracing     Moist Heat Therapy   Number Minutes Moist Heat 15 Minutes    Moist Heat Location Lumbar Spine      Electrical Stimulation   Electrical Stimulation Location Bil LB    Electrical Stimulation Action IFC    Electrical Stimulation Parameters 80-150hz  x24min    Electrical Stimulation Goals Pain;Tone                      PT Short Term Goals - 07/21/20 0740       PT SHORT TERM GOAL #1   Title Assess 5x sit to stand  and TUG and set appropriate LTG    Baseline Normal per 07/12/20    Time 2    Period Weeks    Status Achieved               PT Long Term Goals - 08/02/20 0735       PT LONG TERM GOAL #1   Title ind in HEP and progression and able to verbalize and/or demonstrate correct  body mechanics and ADL modifications to prevent further injury.    Baseline issued today 07/21/20    Time 8    Period Weeks    Status Achieved      PT LONG TERM GOAL #2   Title Pt to report decreased pain in the low back and LEs by >= 50% with ADLS.    Baseline 5-6/10 today 08/02/20    Time 8    Period Weeks    Status On-going      PT LONG TERM GOAL #3   Title Patient able to tolerate a walking program at least 3x/wk without increase in sx.    Baseline walking causes increased pain, no consistant walking program at this time 08/02/20    Time 8    Period Weeks    Status On-going      PT LONG TERM GOAL #4   Title Patient able climb stairs with a reciprocal gait pattern and use of UE support for safety only.    Baseline non reciprocal 08/02/20    Time 8    Period Weeks    Status On-going      PT LONG TERM GOAL #5   Title improved Bil LE strength to 4+/5 to improve body mechanics and transfers.    Time 8    Period Weeks    Status On-going                   Plan - 08/02/20 2585     Clinical Impression Statement Patient tolerated treatment well today. Patient increased activity tolerance on nustep with SPM consistant at 90. Patient able to progress with abdominal bacing progression and stretches today to improve mobility and muscle strength. Patient continues to have limitations with prolong activity, walking and stairs at this time. Patient has not established a walking program yet. Goals progressing today.    Personal Factors and Comorbidities Comorbidity 3+;Time since onset of injury/illness/exacerbation    Comorbidities compression fx L4/5 and bulging disc, OA, spinal stenosis, OP, DM, kyphoplasty L1-L3/4 2018, HTN    Examination-Activity Limitations Bend;Carry;Dressing;Locomotion Level;Stairs;Stand  Examination-Participation Restrictions Aaron Mose    Stability/Clinical Decision Making Stable/Uncomplicated    Rehab Potential Good    PT Frequency 2x / week    PT  Duration 8 weeks    PT Treatment/Interventions ADLs/Self Care Home Management;Aquatic Therapy;Cryotherapy;Electrical Stimulation;Moist Heat;Stair training;Functional mobility training;Therapeutic activities;Therapeutic exercise;Neuromuscular re-education;Balance training;Patient/family education;Manual techniques;Dry needling    PT Next Visit Plan continue with extension based TE (avoid flexion activites due to OP and disc), functional LE strengthening, prone stabilzation; modalities prn.    Consulted and Agree with Plan of Care Patient             Patient will benefit from skilled therapeutic intervention in order to improve the following deficits and impairments:  Impaired sensation, Pain, Improper body mechanics, Increased muscle spasms, Decreased activity tolerance, Decreased range of motion, Decreased strength, Impaired flexibility, Decreased balance  Visit Diagnosis: Radiculopathy, lumbar region  Muscle weakness (generalized)  Unsteadiness on feet     Problem List Patient Active Problem List   Diagnosis Date Noted   Closed compression fracture of L2 lumbar vertebra 10/31/2016   Hyperlipidemia 09/23/2012   Hypothyroid 09/23/2012   Allergic rhinitis 09/23/2012   History of migraine headaches 09/23/2012   IDDM (insulin dependent diabetes mellitus) 06/26/2012   HTN (hypertension) 06/26/2012    Otha Rickles P, PTA 08/02/2020, 8:30 AM  [facility] [address] [place], [place], [address] Phone: [phone]   Fax:  [phone]  Name: Kayla Berger MRN: [id number] Date of Birth: 07/20/1960

## 2020-08-09 ENCOUNTER — Ambulatory Visit: Payer: 59 | Admitting: Physical Therapy

## 2020-08-09 ENCOUNTER — Other Ambulatory Visit: Payer: Self-pay

## 2020-08-09 ENCOUNTER — Encounter: Payer: Self-pay | Admitting: Physical Therapy

## 2020-08-09 DIAGNOSIS — R2681 Unsteadiness on feet: Secondary | ICD-10-CM

## 2020-08-09 DIAGNOSIS — M5416 Radiculopathy, lumbar region: Secondary | ICD-10-CM | POA: Diagnosis not present

## 2020-08-09 DIAGNOSIS — M6281 Muscle weakness (generalized): Secondary | ICD-10-CM

## 2020-08-09 NOTE — Therapy (Signed)
[facility] [address] [place], [place], [address] Phone: [phone]   Fax:  [phone]  Physical Therapy Treatment  Patient Details  Name: Kayla Berger MRN: [id number] Date of Birth: 08/11/1960 Referring Provider (PT): Donalee Citrin MD   Encounter Date: 08/09/2020   PT End of Session - 08/09/20 0743     Visit Number 7    Number of Visits 16    Date for PT Re-Evaluation 08/31/20    Authorization Type UHC    PT Start Time 0732    PT Stop Time 0816    PT Time Calculation (min) 44 min    Activity Tolerance Patient tolerated treatment well    Behavior During Therapy [facility] for tasks assessed/performed             Past Medical History:  Diagnosis Date   Arthritis    Complication of anesthesia    "hard to awaken"   Dysrhythmia    irregular heartbeat- rx   History of kidney stones    Hypertension    Hypothyroidism    synthroid in past no med now   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   Migraines    occ   PCOS (polycystic ovarian syndrome) 1986   Shoulder pain    Thyroid disease    Trochanteric bursitis of left hip    Vaginal cysts    4 have been removed    Past Surgical History:  Procedure Laterality Date   APPENDECTOMY  1967   CARDIAC CATHETERIZATION Bilateral    cataracts   KYPHOPLASTY N/A 10/31/2016   Procedure: KYPHOPLASTY LUMBAR ONE, LUMBAR TWO AND  LUMBAR THREE;  Surgeon: Venita Lick, MD;  Location: [facility] OR;  Service: Orthopedics;  Laterality: N/A;   nsvd  2001   normal vaginal del   sonobysterogram, laporoscopy  1997   fertility test    There were no vitals filed for this visit.   Subjective Assessment - 08/09/20 0742     Subjective COVID-19 screen performed prior to patient entering clinic. Reports that she has started to walk up her driveway some for exercise.    Pertinent History compression fx L4/5 and bulging disc, OA, spinal stenosis, OP, DM, kyphoplasty L1-L3/4 2018, HTN,  DM,    Limitations Standing;Walking;Lifting    How long can you stand comfortably? 30 seconds    How long can you walk comfortably? not at all    Diagnostic tests MRI/xray    Patient Stated Goals I want to walk to the trash can without stopping; want to walk for exercise    Currently in Pain? No/denies                [facility] PT Assessment - 08/09/20 0001       Assessment   Medical Diagnosis lumbar stenosis with neurogenic claudication    Referring Provider (PT) Donalee Citrin MD    Onset Date/Surgical Date 11/29/19    Next MD Visit 08/10/20      Precautions   Precautions Other (comment)    Precaution Comments OP and continuous glucose monitor CGM                           [facility] Adult PT Treatment/Exercise - 08/09/20 0001       Lumbar Exercises: Aerobic   Nustep L3 x85min UE/LE      Lumbar Exercises: Seated   Long Arc Quad on Chair AROM;Both;2 sets;10 reps  Hip Flexion on Ball AROM;Both;20 reps    Sit to Stand 15 reps   focus on proper posture     Lumbar Exercises: Supine   Clam 20 reps;5 seconds;Limitations    Clam Limitations red theraband    Bent Knee Raise 20 reps;3 seconds    Bent Knee Raise Limitations red theraband    Bridge with Ball Squeeze 20 reps;5 seconds    Straight Leg Raise 20 reps;2 seconds    Straight Leg Raises Limitations LLE      Lumbar Exercises: Sidelying   Clam Left;20 reps;2 seconds                      PT Short Term Goals - 07/21/20 0740       PT SHORT TERM GOAL #1   Title Assess 5x sit to stand  and TUG and set appropriate LTG    Baseline Normal per 07/12/20    Time 2    Period Weeks    Status Achieved               PT Long Term Goals - 08/02/20 0735       PT LONG TERM GOAL #1   Title ind in HEP and progression and able to verbalize and/or demonstrate correct body mechanics and ADL modifications to prevent further injury.    Baseline issued today 07/21/20    Time 8    Period Weeks    Status  Achieved      PT LONG TERM GOAL #2   Title Pt to report decreased pain in the low back and LEs by >= 50% with ADLS.    Baseline 5-6/10 today 08/02/20    Time 8    Period Weeks    Status On-going      PT LONG TERM GOAL #3   Title Patient able to tolerate a walking program at least 3x/wk without increase in sx.    Baseline walking causes increased pain, no consistant walking program at this time 08/02/20    Time 8    Period Weeks    Status On-going      PT LONG TERM GOAL #4   Title Patient able climb stairs with a reciprocal gait pattern and use of UE support for safety only.    Baseline non reciprocal 08/02/20    Time 8    Period Weeks    Status On-going      PT LONG TERM GOAL #5   Title improved Bil LE strength to 4+/5 to improve body mechanics and transfers.    Time 8    Period Weeks    Status On-going                   Plan - 08/09/20 0841     Clinical Impression Statement Patient presented in clinic with reports of improvement as she has no pain and has to be extra conscious of ambulation to avoid limping. Patient progressed through a focus on posture and stabilization exercises. Patient instructed in proper core and pelvic neutral position and strengthening. Patient noting more LLE weakness with therex and LLE fatiguing faster with therex. Patient ambulating more but with companion via her sister as she is afraid of falling. Patient noted LLE soreness upon end of session.    Personal Factors and Comorbidities Comorbidity 3+;Time since onset of injury/illness/exacerbation    Comorbidities compression fx L4/5 and bulging disc, OA, spinal stenosis, OP, DM, kyphoplasty L1-L3/4 2018, HTN  Examination-Activity Limitations Bend;Carry;Dressing;Locomotion Level;Stairs;Stand    Examination-Participation Restrictions [employer]    Stability/Clinical Decision Making Stable/Uncomplicated    Rehab Potential Good    PT Frequency 2x / week    PT Duration 8 weeks    PT  Treatment/Interventions ADLs/Self Care Home Management;Aquatic Therapy;Cryotherapy;Electrical Stimulation;Moist Heat;Stair training;Functional mobility training;Therapeutic activities;Therapeutic exercise;Neuromuscular re-education;Balance training;Patient/family education;Manual techniques;Dry needling    PT Next Visit Plan continue with extension based TE (avoid flexion activites due to OP and disc), functional LE strengthening, prone stabilzation; modalities prn.    PT Home Exercise Plan [id number] (prone lying, bridging)    Consulted and Agree with Plan of Care Patient             Patient will benefit from skilled therapeutic intervention in order to improve the following deficits and impairments:  Impaired sensation, Pain, Improper body mechanics, Increased muscle spasms, Decreased activity tolerance, Decreased range of motion, Decreased strength, Impaired flexibility, Decreased balance  Visit Diagnosis: Radiculopathy, lumbar region  Muscle weakness (generalized)  Unsteadiness on feet     Problem List Patient Active Problem List   Diagnosis Date Noted   Closed compression fracture of L2 lumbar vertebra 10/31/2016   Hyperlipidemia 09/23/2012   Hypothyroid 09/23/2012   Allergic rhinitis 09/23/2012   History of migraine headaches 09/23/2012   IDDM (insulin dependent diabetes mellitus) 06/26/2012   HTN (hypertension) 06/26/2012    Marvell Fuller, PTA 08/09/2020, 8:48 AM  [facility] [address] [place], [place], [address] Phone: [phone]   Fax:  [phone]  Name: Kayla Berger MRN: [id number] Date of Birth: 30-Nov-1960

## 2020-08-11 ENCOUNTER — Other Ambulatory Visit: Payer: Self-pay

## 2020-08-11 ENCOUNTER — Ambulatory Visit: Payer: 59

## 2020-08-11 DIAGNOSIS — M5416 Radiculopathy, lumbar region: Secondary | ICD-10-CM | POA: Diagnosis not present

## 2020-08-11 DIAGNOSIS — R2681 Unsteadiness on feet: Secondary | ICD-10-CM

## 2020-08-11 DIAGNOSIS — M6281 Muscle weakness (generalized): Secondary | ICD-10-CM

## 2020-08-11 NOTE — Therapy (Signed)
[facility] [address] [place], [place], [address] Phone: [phone]   Fax:  [phone]  Physical Therapy Treatment  Patient Details  Name: Kayla Berger MRN: [id number] Date of Birth: 11/11/1960 Referring Provider (PT): Donalee Citrin MD   Encounter Date: 08/11/2020   PT End of Session - 08/11/20 1023     Visit Number 8    Number of Visits 16    Date for PT Re-Evaluation 08/31/20    Authorization Type UHC    PT Start Time 0733    PT Stop Time 0832    PT Time Calculation (min) 59 min    Activity Tolerance Patient tolerated treatment well    Behavior During Therapy [facility] for tasks assessed/performed             Past Medical History:  Diagnosis Date   Arthritis    Complication of anesthesia    "hard to awaken"   Dysrhythmia    irregular heartbeat- rx   History of kidney stones    Hypertension    Hypothyroidism    synthroid in past no med now   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   Migraines    occ   PCOS (polycystic ovarian syndrome) 1986   Shoulder pain    Thyroid disease    Trochanteric bursitis of left hip    Vaginal cysts    4 have been removed    Past Surgical History:  Procedure Laterality Date   APPENDECTOMY  1967   CARDIAC CATHETERIZATION Bilateral    cataracts   KYPHOPLASTY N/A 10/31/2016   Procedure: KYPHOPLASTY LUMBAR ONE, LUMBAR TWO AND  LUMBAR THREE;  Surgeon: Venita Lick, MD;  Location: [facility] OR;  Service: Orthopedics;  Laterality: N/A;   nsvd  2001   normal vaginal del   sonobysterogram, laporoscopy  1997   fertility test    There were no vitals filed for this visit.   Subjective Assessment - 08/11/20 0739     Subjective COVID-19 screen performed prior to patient entering clinic. Reports that she has been walking a bit (quarter mile).    Pertinent History compression fx L4/5 and bulging disc, OA, spinal stenosis, OP, DM, kyphoplasty L1-L3/4 2018, HTN, DM,     Limitations Standing;Walking;Lifting    Patient Stated Goals I want to walk to the trash can without stopping; want to walk for exercise    Currently in Pain? Other (Comment)   reports that she feels a bit unsteady today                              [facility] Adult PT Treatment/Exercise - 08/11/20 0733       Lumbar Exercises: Stretches   Single Knee to Chest Stretch Right;Left;2 reps;30 seconds      Lumbar Exercises: Aerobic   Nustep L3 x16min UE/LE      Lumbar Exercises: Seated   Long Arc Quad on Chair AROM;Both;2 sets;10 reps    LAQ on Chair Weights (lbs) 1    Other Seated Lumbar Exercises seated physioball pressdowns      Lumbar Exercises: Supine   Bent Knee Raise 20 reps;3 seconds    Bridge with Ball Squeeze 20 reps;5 seconds    Straight Leg Raise 20 reps;2 seconds    Straight Leg Raises Limitations BLE      Modalities   Modalities Electrical Stimulation;Moist Heat;Ultrasound      Electrical Stimulation  Electrical Stimulation Location Bil LB    Electrical Stimulation Action IFC    Electrical Stimulation Parameters 80-150hz  x 15 mins    Electrical Stimulation Goals Pain;Tone                      PT Short Term Goals - 07/21/20 0740       PT SHORT TERM GOAL #1   Title Assess 5x sit to stand  and TUG and set appropriate LTG    Baseline Normal per 07/12/20    Time 2    Period Weeks    Status Achieved               PT Long Term Goals - 08/02/20 0735       PT LONG TERM GOAL #1   Title ind in HEP and progression and able to verbalize and/or demonstrate correct body mechanics and ADL modifications to prevent further injury.    Baseline issued today 07/21/20    Time 8    Period Weeks    Status Achieved      PT LONG TERM GOAL #2   Title Pt to report decreased pain in the low back and LEs by >= 50% with ADLS.    Baseline 5-6/10 today 08/02/20    Time 8    Period Weeks    Status On-going      PT LONG TERM GOAL #3   Title Patient  able to tolerate a walking program at least 3x/wk without increase in sx.    Baseline walking causes increased pain, no consistant walking program at this time 08/02/20    Time 8    Period Weeks    Status On-going      PT LONG TERM GOAL #4   Title Patient able climb stairs with a reciprocal gait pattern and use of UE support for safety only.    Baseline non reciprocal 08/02/20    Time 8    Period Weeks    Status On-going      PT LONG TERM GOAL #5   Title improved Bil LE strength to 4+/5 to improve body mechanics and transfers.    Time 8    Period Weeks    Status On-going                   Plan - 08/11/20 1024     Clinical Impression Statement Patient presents to PT clinic with moderate irritability of symptoms. She performs activities to limitation and was able to progress seated marching withj 1# at the ankles. She required intermittent verbal cues for sit to stand to encourage improved mobility and posture with activite. She reports a 4/10 pain at the end of session prior to estim modality. Skilled PT recomended to continue per plan of care.    Personal Factors and Comorbidities Comorbidity 3+;Time since onset of injury/illness/exacerbation    Comorbidities compression fx L4/5 and bulging disc, OA, spinal stenosis, OP, DM, kyphoplasty L1-L3/4 2018, HTN    Examination-Activity Limitations Bend;Carry;Dressing;Locomotion Level;Stairs;Stand    Examination-Participation Restrictions [employer]    Stability/Clinical Decision Making Stable/Uncomplicated    Clinical Decision Making Low    Rehab Potential Good    PT Frequency 2x / week    PT Duration 8 weeks    PT Treatment/Interventions ADLs/Self Care Home Management;Aquatic Therapy;Cryotherapy;Electrical Stimulation;Moist Heat;Stair training;Functional mobility training;Therapeutic activities;Therapeutic exercise;Neuromuscular re-education;Balance training;Patient/family education;Manual techniques;Dry needling    PT Next  Visit Plan continue with extension based TE (avoid flexion activites  due to OP and disc), functional LE strengthening, prone stabilzation; modalities prn.    Consulted and Agree with Plan of Care Patient             Patient will benefit from skilled therapeutic intervention in order to improve the following deficits and impairments:  Impaired sensation, Pain, Improper body mechanics, Increased muscle spasms, Decreased activity tolerance, Decreased range of motion, Decreased strength, Impaired flexibility, Decreased balance  Visit Diagnosis: Radiculopathy, lumbar region  Muscle weakness (generalized)  Unsteadiness on feet     Problem List Patient Active Problem List   Diagnosis Date Noted   Closed compression fracture of L2 lumbar vertebra 10/31/2016   Hyperlipidemia 09/23/2012   Hypothyroid 09/23/2012   Allergic rhinitis 09/23/2012   History of migraine headaches 09/23/2012   IDDM (insulin dependent diabetes mellitus) 06/26/2012   HTN (hypertension) 06/26/2012    Levonne Spiller PT, DPT 08/11/2020, 10:31 AM  [facility] [address] [place], [place], [address] Phone: [phone]   Fax:  [phone]  Name: Kayla Berger MRN: [id number] Date of Birth: [birth year]

## 2020-08-15 ENCOUNTER — Other Ambulatory Visit: Payer: Self-pay

## 2020-08-15 ENCOUNTER — Ambulatory Visit: Payer: 59

## 2020-08-15 DIAGNOSIS — R2681 Unsteadiness on feet: Secondary | ICD-10-CM

## 2020-08-15 DIAGNOSIS — M6281 Muscle weakness (generalized): Secondary | ICD-10-CM

## 2020-08-15 DIAGNOSIS — M5416 Radiculopathy, lumbar region: Secondary | ICD-10-CM | POA: Diagnosis not present

## 2020-08-15 NOTE — Therapy (Signed)
[facility] [address] [place], [place], [address] Phone: [phone]   Fax:  [phone]  Physical Therapy Treatment  Patient Details  Name: Kayla Berger MRN: [id number] Date of Birth: 08/17/1960 Referring Provider (PT): Donalee Citrin MD   Encounter Date: 08/15/2020   PT End of Session - 08/15/20 0743     Visit Number 9    Number of Visits 16    Date for PT Re-Evaluation 08/31/20    Authorization Type UHC    PT Start Time 0730    PT Stop Time 0828    PT Time Calculation (min) 58 min    Activity Tolerance Patient tolerated treatment well    Behavior During Therapy [facility] for tasks assessed/performed             Past Medical History:  Diagnosis Date   Arthritis    Complication of anesthesia    "hard to awaken"   Dysrhythmia    irregular heartbeat- rx   History of kidney stones    Hypertension    Hypothyroidism    synthroid in past no med now   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   Migraines    occ   PCOS (polycystic ovarian syndrome) 1986   Shoulder pain    Thyroid disease    Trochanteric bursitis of left hip    Vaginal cysts    4 have been removed    Past Surgical History:  Procedure Laterality Date   APPENDECTOMY  1967   CARDIAC CATHETERIZATION Bilateral    cataracts   KYPHOPLASTY N/A 10/31/2016   Procedure: KYPHOPLASTY LUMBAR ONE, LUMBAR TWO AND  LUMBAR THREE;  Surgeon: Venita Lick, MD;  Location: [facility] OR;  Service: Orthopedics;  Laterality: N/A;   nsvd  2001   normal vaginal del   sonobysterogram, laporoscopy  1997   fertility test    There were no vitals filed for this visit.   Subjective Assessment - 08/15/20 0735     Subjective COVID-19 screen performed prior to patient entering clinic. Reports that she was sores after last visit in her abdominals. She reports having done some yard work over the weekend with lots of raking in the yard. She states that her lower back pain is  there at a 2-3 and she does not think it will ever go away.    Pertinent History compression fx L4/5 and bulging disc, OA, spinal stenosis, OP, DM, kyphoplasty L1-L3/4 2018, HTN, DM,    Limitations Standing;Walking;Lifting    How long can you stand comfortably? 30 seconds    How long can you walk comfortably? not at all    Diagnostic tests MRI/xray    Patient Stated Goals I want to walk to the trash can without stopping; want to walk for exercise    Currently in Pain? Yes    Pain Score 3     Pain Location Back    Pain Orientation Lower    Pain Descriptors / Indicators Aching    Pain Type Chronic pain    Pain Onset More than a month ago                [facility] Adult PT Treatment/Exercise - 08/15/20 0001       Lumbar Exercises: Aerobic   Nustep L3 x44min UE/LE      Lumbar Exercises: Seated   Long Arc Quad on Chair AROM;Both;2 sets;10 reps    Hip Flexion on Ball AROM;Both;20 reps  Sit to Stand 15 reps   focus on proper posture without UE assist     Lumbar Exercises: Supine   Clam 20 reps;5 seconds;Limitations    Clam Limitations red theraband    Bent Knee Raise 20 reps;3 seconds    Bent Knee Raise Limitations red theraband    Bridge with Ball Squeeze 20 reps;5 seconds    Straight Leg Raise 20 reps;2 seconds    Straight Leg Raises Limitations LLE      Lumbar Exercises: Sidelying   Clam Left;20 reps;2 seconds      Modalities   Modalities Electrical Stimulation;Moist Heat;Ultrasound      Moist Heat Therapy   Number Minutes Moist Heat 10 Minutes    Moist Heat Location Lumbar Spine      Electrical Stimulation   Electrical Stimulation Location Bil LB    Electrical Stimulation Action IFC    Electrical Stimulation Parameters 80-150Hz     Electrical Stimulation Goals Pain;Tone                    PT Education - 08/15/20 [phone]     Education Details Safety with ab sets with activities, repeated twisting can flare up the low back    Person(s) Educated Patient     Methods Explanation    Comprehension Verbalized understanding              PT Short Term Goals - 07/21/20 0740       PT SHORT TERM GOAL #1   Title Assess 5x sit to stand  and TUG and set appropriate LTG    Baseline Normal per 07/12/20    Time 2    Period Weeks    Status Achieved               PT Long Term Goals - 08/02/20 0735       PT LONG TERM GOAL #1   Title ind in HEP and progression and able to verbalize and/or demonstrate correct body mechanics and ADL modifications to prevent further injury.    Baseline issued today 07/21/20    Time 8    Period Weeks    Status Achieved      PT LONG TERM GOAL #2   Title Pt to report decreased pain in the low back and LEs by >= 50% with ADLS.    Baseline 5-6/10 today 08/02/20    Time 8    Period Weeks    Status On-going      PT LONG TERM GOAL #3   Title Patient able to tolerate a walking program at least 3x/wk without increase in sx.    Baseline walking causes increased pain, no consistant walking program at this time 08/02/20    Time 8    Period Weeks    Status On-going      PT LONG TERM GOAL #4   Title Patient able climb stairs with a reciprocal gait pattern and use of UE support for safety only.    Baseline non reciprocal 08/02/20    Time 8    Period Weeks    Status On-going      PT LONG TERM GOAL #5   Title improved Bil LE strength to 4+/5 to improve body mechanics and transfers.    Time 8    Period Weeks    Status On-going                   Plan - 08/15/20 0744     Clinical  Impression Statement Patient presents with minimal irritability of symptoms. She performs activities encouraged this visit with hesitancy but able to do with cues for abdominal bracing. She denies pain and has normal response to modalities. Her blood sugar monitor alerted her of a 70 value and was provided a nutrigrain bar. SkilledPT recommended to continue per plan of care.    Personal Factors and Comorbidities Comorbidity 3+;Time  since onset of injury/illness/exacerbation    Comorbidities compression fx L4/5 and bulging disc, OA, spinal stenosis, OP, DM, kyphoplasty L1-L3/4 2018, HTN    Examination-Activity Limitations Bend;Carry;Dressing;Locomotion Level;Stairs;Stand    Examination-Participation Restrictions [employer]    Stability/Clinical Decision Making Stable/Uncomplicated    Clinical Decision Making Low    Rehab Potential Good    PT Frequency 2x / week    PT Duration 8 weeks    PT Treatment/Interventions ADLs/Self Care Home Management;Aquatic Therapy;Cryotherapy;Electrical Stimulation;Moist Heat;Stair training;Functional mobility training;Therapeutic activities;Therapeutic exercise;Neuromuscular re-education;Balance training;Patient/family education;Manual techniques;Dry needling    PT Next Visit Plan continue with extension based TE (avoid flexion activites due to OP and disc), functional LE strengthening, prone stabilzation; modalities prn.             Patient will benefit from skilled therapeutic intervention in order to improve the following deficits and impairments:  Impaired sensation, Pain, Improper body mechanics, Increased muscle spasms, Decreased activity tolerance, Decreased range of motion, Decreased strength, Impaired flexibility, Decreased balance  Visit Diagnosis: Radiculopathy, lumbar region  Muscle weakness (generalized)  Unsteadiness on feet     Problem List Patient Active Problem List   Diagnosis Date Noted   Closed compression fracture of L2 lumbar vertebra 10/31/2016   Hyperlipidemia 09/23/2012   Hypothyroid 09/23/2012   Allergic rhinitis 09/23/2012   History of migraine headaches 09/23/2012   IDDM (insulin dependent diabetes mellitus) 06/26/2012   HTN (hypertension) 06/26/2012    Levonne Spiller PT, DPT 08/15/2020, 8:48 AM  [facility] [address] [place], [place], [address] Phone: [phone]   Fax:   [phone]  Name: Kayla Berger MRN: [id number] Date of Birth: [birth year]

## 2020-08-17 ENCOUNTER — Other Ambulatory Visit: Payer: Self-pay

## 2020-08-17 ENCOUNTER — Ambulatory Visit: Payer: 59

## 2020-08-17 DIAGNOSIS — M5416 Radiculopathy, lumbar region: Secondary | ICD-10-CM

## 2020-08-17 DIAGNOSIS — R2681 Unsteadiness on feet: Secondary | ICD-10-CM

## 2020-08-17 DIAGNOSIS — M6281 Muscle weakness (generalized): Secondary | ICD-10-CM

## 2020-08-17 NOTE — Therapy (Signed)
[facility] [address] [place], [place], [address] Phone: [phone]   Fax:  [phone]  Physical Therapy Treatment  Patient Details  Name: Kayla Berger MRN: [id number] Date of Birth: 1960/05/23 Referring Provider (PT): Donalee Citrin MD   Encounter Date: 08/17/2020   PT End of Session - 08/17/20 0915     Visit Number 10    Number of Visits 16    Date for PT Re-Evaluation 08/31/20    Authorization Type UHC    PT Start Time 0730    PT Stop Time 0834    PT Time Calculation (min) 64 min    Activity Tolerance Patient tolerated treatment well    Behavior During Therapy [facility] for tasks assessed/performed             Past Medical History:  Diagnosis Date   Arthritis    Complication of anesthesia    "hard to awaken"   Dysrhythmia    irregular heartbeat- rx   History of kidney stones    Hypertension    Hypothyroidism    synthroid in past no med now   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   Migraines    occ   PCOS (polycystic ovarian syndrome) 1986   Shoulder pain    Thyroid disease    Trochanteric bursitis of left hip    Vaginal cysts    4 have been removed    Past Surgical History:  Procedure Laterality Date   APPENDECTOMY  1967   CARDIAC CATHETERIZATION Bilateral    cataracts   KYPHOPLASTY N/A 10/31/2016   Procedure: KYPHOPLASTY LUMBAR ONE, LUMBAR TWO AND  LUMBAR THREE;  Surgeon: Venita Lick, MD;  Location: [facility] OR;  Service: Orthopedics;  Laterality: N/A;   nsvd  2001   normal vaginal del   sonobysterogram, laporoscopy  1997   fertility test    There were no vitals filed for this visit.   Subjective Assessment - 08/17/20 0740     Subjective COVID-19 screen performed prior to patient entering clinic. Reports that she felt a little better after therapy last visit. She reports having a 2/10 pain today still on the R side - nothing radiating through at that time.    Pertinent History  compression fx L4/5 and bulging disc, OA, spinal stenosis, OP, DM, kyphoplasty L1-L3/4 2018, HTN, DM,    How long can you stand comfortably? 30 seconds    Diagnostic tests MRI/xray    Patient Stated Goals I want to walk to the trash can without stopping; want to walk for exercise    Currently in Pain? Yes    Pain Score 2     Pain Location Back    Pain Orientation Lower    Pain Descriptors / Indicators Aching    Pain Onset More than a month ago    Pain Frequency Constant                [facility] PT Assessment - 08/17/20 0001       Assessment   Medical Diagnosis lumbar stenosis with neurogenic claudication    Referring Provider (PT) Donalee Citrin MD    Onset Date/Surgical Date 11/29/19    Next MD Visit 11/09/20      Home Environment   Living Environment Private residence    Living Arrangements Other relatives    Type of Home House    Additional Comments office in basement      Prior Function   Level  of Independence Independent    Vocation Full time employment    Vocation Requirements 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 [employer]   Functional tests Squat      Squat   Comments WFL      ROM / Strength   AROM / PROM / Strength AROM;Strength      AROM   Overall AROM Comments Lumbar flex 25%, ext 25%, right SB WFL, Left SB  no pain, Bil rot 25% with pain at end range      Strength   Strength Assessment Site Hip;Knee    Right/Left Hip Right;Left    Right Hip Flexion 4/5    Right Hip Extension 4/5    Right Hip ABduction 5/5    Left Hip Flexion 5/5    Left Hip Extension 3/5    Left Hip ABduction 3/5    Right/Left Knee Right;Left    Right Knee Flexion 5/5    Right Knee Extension 5/5    Left Knee Flexion 5/5    Left Knee Extension 5/5      Palpation   Palpation comment Bil glute min/med R > L      Special Tests   Other special tests prone lying with reduced tension in lumbar region                [facility] Adult PT Treatment/Exercise - 08/17/20 0001        Posture/Postural Control   Posture Comments even pelvic landmarks      Exercises   Exercises Lumbar;Knee/Hip      Lumbar Exercises: Stretches   Press Ups 5 reps    Other Lumbar Stretch Exercise physionball roll up the wall      Lumbar Exercises: Aerobic   Stationary Bike 10 mins lvl 3 , seat 2      Lumbar Exercises: Standing   Shoulder Extension Strengthening;Both;20 reps;Theraband    Theraband Level (Shoulder Extension) Level 4 (Blue);Other (comment)    Shoulder Extension Limitations XTS  with marching      Lumbar Exercises: Seated   Long Arc Quad on Chair AROM;Both;2 sets;10 reps      Lumbar Exercises: Supine   Ab Set 20 reps;5 seconds    Straight Leg Raise 20 reps;2 seconds    Straight Leg Raises Limitations LLE      Lumbar Exercises: Prone   Other Prone Lumbar Exercises POE x3 min      Modalities   Modalities Electrical Stimulation;Moist Heat;Ultrasound      Electrical Stimulation   Electrical Stimulation Location Bil LB    Electrical Stimulation Action IFC    Electrical Stimulation Parameters 80-150Hz ; 9.0 cv    Electrical Stimulation Goals Pain;Tone      Manual Therapy   Manual Therapy Soft tissue mobilization;Joint mobilization    Joint Mobilization lateral/inferiotr mob of the R innominat    Soft tissue mobilization prone STM to the glut med/min attachmnt                PT Short Term Goals - 07/21/20 0740       PT SHORT TERM GOAL #1   Title Assess 5x sit to stand  and TUG and set appropriate LTG    Baseline Normal per 07/12/20    Time 2    Period Weeks    Status Achieved               PT Long Term Goals - 08/17/20 0830  PT LONG TERM GOAL #1   Title ind in HEP and progression and able to verbalize and/or demonstrate correct body mechanics and ADL modifications to prevent further injury.    Baseline issued today 07/21/20    Time 8    Period Weeks    Status Achieved      PT LONG TERM GOAL #2   Title Pt to report decreased pain  in the low back and LEs by >= 50% with ADLS.    Baseline 5-6/10 today 08/02/20    Time 8    Period Weeks    Status On-going      PT LONG TERM GOAL #3   Title Patient able to tolerate a walking program at least 3x/wk without increase in sx.    Baseline walking causes increased pain, no consistant walking program at this time 08/02/20    Time 8    Period Weeks    Status On-going      PT LONG TERM GOAL #4   Title Patient able climb stairs with a reciprocal gait pattern and use of UE support for safety only.    Baseline non reciprocal 08/02/20    Time 8    Period Weeks    Status On-going      PT LONG TERM GOAL #5   Title improved Bil LE strength to 4+/5 to improve body mechanics and transfers.    Time 8    Period Weeks    Status On-going                Plan - 08/17/20 9563     Clinical Impression Statement Patient presents with progressive improvement with less pain/weakness on the LLE. She was encouraged to continue core strengthening and working on lumbopelvic mobility. She responds normal  to modalities and with appropriate modificatins is apropriate to conitnue skilled PT.    Personal Factors and Comorbidities Comorbidity 3+;Time since onset of injury/illness/exacerbation    Comorbidities compression fx L4/5 and bulging disc, OA, spinal stenosis, OP, DM, kyphoplasty L1-L3/4 2018, HTN    Examination-Activity Limitations Bend;Carry;Dressing;Locomotion Level;Stairs;Stand    Examination-Participation Restrictions [employer]    Stability/Clinical Decision Making Stable/Uncomplicated    Clinical Decision Making Low    Rehab Potential Good    PT Frequency 2x / week    PT Duration 8 weeks    PT Treatment/Interventions ADLs/Self Care Home Management;Aquatic Therapy;Cryotherapy;Electrical Stimulation;Moist Heat;Stair training;Functional mobility training;Therapeutic activities;Therapeutic exercise;Neuromuscular re-education;Balance training;Patient/family education;Manual  techniques;Dry needling    PT Next Visit Plan continue with extension based TE (avoid flexion activites due to OP and disc), functional LE strengthening, prone stabilzation; modalities prn.    Consulted and Agree with Plan of Care Patient             Patient will benefit from skilled therapeutic intervention in order to improve the following deficits and impairments:  Impaired sensation, Pain, Improper body mechanics, Increased muscle spasms, Decreased activity tolerance, Decreased range of motion, Decreased strength, Impaired flexibility, Decreased balance   Progress Note Reporting Period 07/06/20 to 07/29/20  See note below for Objective Data and Assessment of Progress/Goals.     Visit Diagnosis: Radiculopathy, lumbar region  Muscle weakness (generalized)  Unsteadiness on feet     Problem List Patient Active Problem List   Diagnosis Date Noted   Closed compression fracture of L2 lumbar vertebra 10/31/2016   Hyperlipidemia 09/23/2012   Hypothyroid 09/23/2012   Allergic rhinitis 09/23/2012   History of migraine headaches 09/23/2012   IDDM (insulin dependent diabetes mellitus)  06/26/2012   HTN (hypertension) 06/26/2012    Levonne Spillerourtney N Deward Sebek 08/17/2020, 9:27 AM  [facility] [address] [place], [place], [address] Phone: [phone]   Fax:  [phone]  Name: Kayla Berger MRN: [id number] Date of Birth: 12-Mar-1960

## 2020-08-22 ENCOUNTER — Other Ambulatory Visit: Payer: Self-pay

## 2020-08-22 ENCOUNTER — Ambulatory Visit: Payer: 59

## 2020-08-22 DIAGNOSIS — M5416 Radiculopathy, lumbar region: Secondary | ICD-10-CM | POA: Diagnosis not present

## 2020-08-22 DIAGNOSIS — R2681 Unsteadiness on feet: Secondary | ICD-10-CM

## 2020-08-22 DIAGNOSIS — M6281 Muscle weakness (generalized): Secondary | ICD-10-CM

## 2020-08-22 NOTE — Therapy (Signed)
[facility] [address] [place], [place], [address] Phone: [phone]   Fax:  [phone]  Physical Therapy Treatment  Patient Details  Name: RONNESHA MESTER MRN: [id number] Date of Birth: [birth year] Referring Provider (PT): Donalee Citrin MD   Encounter Date: 08/22/2020   PT End of Session - 08/22/20 0832     Visit Number 11    Number of Visits 16    Date for PT Re-Evaluation 08/31/20    Authorization Type UHC    PT Start Time 0730    PT Stop Time 0817    PT Time Calculation (min) 47 min    Activity Tolerance Patient tolerated treatment well    Behavior During Therapy [facility] for tasks assessed/performed             Past Medical History:  Diagnosis Date   Arthritis    Complication of anesthesia    "hard to awaken"   Dysrhythmia    irregular heartbeat- rx   History of kidney stones    Hypertension    Hypothyroidism    synthroid in past no med now   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   Migraines    occ   PCOS (polycystic ovarian syndrome) 1986   Shoulder pain    Thyroid disease    Trochanteric bursitis of left hip    Vaginal cysts    4 have been removed    Past Surgical History:  Procedure Laterality Date   APPENDECTOMY  1967   CARDIAC CATHETERIZATION Bilateral    cataracts   KYPHOPLASTY N/A 10/31/2016   Procedure: KYPHOPLASTY LUMBAR ONE, LUMBAR TWO AND  LUMBAR THREE;  Surgeon: Venita Lick, MD;  Location: [facility] OR;  Service: Orthopedics;  Laterality: N/A;   nsvd  2001   normal vaginal del   sonobysterogram, laporoscopy  1997   fertility test    There were no vitals filed for this visit.   Subjective Assessment - 08/22/20 0736     Subjective COVID-19 screen performed prior to patient entering clinic. She states that she has been able to walk a good distance over the weekend .She reports that she was doing several squats over the weekend with her older sister. She is curious if she is able  to do them herself. She says she did not take the pain meds on saturday (she takes half = 400mg  of gabapentin) and believes that was why she was sore on sunday    Pertinent History compression fx L4/5 and bulging disc, OA, spinal stenosis, OP, DM, kyphoplasty L1-L3/4 2018, HTN, DM,    Limitations Standing;Walking;Lifting    How long can you sit comfortably? not limited; only when sitting on soft couches    How long can you stand comfortably? "a whole lot better; 10-15 mins (i used to not be able to stand to cook"    How long can you walk comfortably? a quarter of a mile; not quite a half mile as she is afraid of regression - but it is easier.    Patient Stated Goals I would like to be able to have more flexibility and strength in my legs; to be able to lift the trashbag.    Currently in Pain? Yes    Pain Score 2     Pain Location Back    Pain Orientation Lower    Pain Descriptors / Indicators Aching  [facility] Adult PT Treatment/Exercise - 08/22/20 0001       Exercises   Exercises Lumbar;Knee/Hip      Lumbar Exercises: Stretches   Active Hamstring Stretch Right;Left;2 reps;20 seconds    Active Hamstring Stretch Limitations walking    Single Knee to Chest Stretch Right;Left;2 reps;30 seconds    Quad Stretch 3 reps;20 seconds    Quad Stretch Limitations foot on chair; hands on walls    Figure 4 Stretch 3 reps;10 seconds    Figure 4 Stretch Limitations seated    Other Lumbar Stretch Exercise posterior pelvic tilt on the wall with OH reach      Lumbar Exercises: Aerobic   Stationary Bike 10 mins lvl 3 LE/UE, seat 8; 5 mins with LE only      Lumbar Exercises: Standing   Functional Squats 15 reps    Functional Squats Limitations repeated lifting; wide stance      Lumbar Exercises: Seated   Sit to Stand 15 reps;Other (comment)   with 8# weight in hands     Lumbar Exercises: Supine   Ab Set 5 seconds    AB Set Limitations standing press  downs                      PT Short Term Goals - 07/21/20 0740       PT SHORT TERM GOAL #1   Title Assess 5x sit to stand  and TUG and set appropriate LTG    Baseline Normal per 07/12/20    Time 2    Period Weeks    Status Achieved               PT Long Term Goals - 08/17/20 0830       PT LONG TERM GOAL #1   Title ind in HEP and progression and able to verbalize and/or demonstrate correct body mechanics and ADL modifications to prevent further injury.    Baseline issued today 07/21/20    Time 8    Period Weeks    Status Achieved      PT LONG TERM GOAL #2   Title Pt to report decreased pain in the low back and LEs by >= 50% with ADLS.    Baseline 5-6/10 today 08/02/20    Time 8    Period Weeks    Status On-going      PT LONG TERM GOAL #3   Title Patient able to tolerate a walking program at least 3x/wk without increase in sx.    Baseline walking causes increased pain, no consistant walking program at this time 08/02/20    Time 8    Period Weeks    Status On-going      PT LONG TERM GOAL #4   Title Patient able climb stairs with a reciprocal gait pattern and use of UE support for safety only.    Baseline non reciprocal 08/02/20    Time 8    Period Weeks    Status On-going      PT LONG TERM GOAL #5   Title improved Bil LE strength to 4+/5 to improve body mechanics and transfers.    Time 8    Period Weeks    Status On-going                   Plan - 08/22/20 0803     Clinical Impression Statement Patient presents to PT with minimal irritability of symptoms. She demonstrates progressing with single leg stance  and balance. Her lumbopelvic motor control is within functional abilityies as she demonstrates various levels of squatting ability. She is progressing well and appropriate to continue per skilled PT recommendations.    Personal Factors and Comorbidities Comorbidity 3+;Time since onset of injury/illness/exacerbation    Comorbidities  compression fx L4/5 and bulging disc, OA, spinal stenosis, OP, DM, kyphoplasty L1-L3/4 2018, HTN    Examination-Activity Limitations Bend;Carry;Dressing;Locomotion Level;Stairs;Stand    Examination-Participation Restrictions [employer]    Stability/Clinical Decision Making Stable/Uncomplicated    Clinical Decision Making Low    Rehab Potential Good    PT Frequency 2x / week    PT Duration 8 weeks    PT Treatment/Interventions ADLs/Self Care Home Management;Aquatic Therapy;Cryotherapy;Electrical Stimulation;Moist Heat;Stair training;Functional mobility training;Therapeutic activities;Therapeutic exercise;Neuromuscular re-education;Balance training;Patient/family education;Manual techniques;Dry needling    PT Next Visit Plan functional LE strengthening, prone stabilzation; lifting,push/pulling .    Consulted and Agree with Plan of Care Patient             Patient will benefit from skilled therapeutic intervention in order to improve the following deficits and impairments:  Pain, Improper body mechanics, Increased muscle spasms, Decreased activity tolerance, Decreased range of motion, Decreased strength, Impaired flexibility, Decreased balance, Impaired sensation  Visit Diagnosis: Radiculopathy, lumbar region  Muscle weakness (generalized)  Unsteadiness on feet     Problem List Patient Active Problem List   Diagnosis Date Noted   Closed compression fracture of L2 lumbar vertebra 10/31/2016   Hyperlipidemia 09/23/2012   Hypothyroid 09/23/2012   Allergic rhinitis 09/23/2012   History of migraine headaches 09/23/2012   IDDM (insulin dependent diabetes mellitus) 06/26/2012   HTN (hypertension) 06/26/2012    Levonne Spiller PT, DPT 08/22/2020, 8:32 AM  [facility] [address] [place], [place], [address] Phone: [phone]   Fax:  [phone]  Name: [place] DOST MRN: [id number] Date of Birth: 16-May-1960

## 2020-08-24 ENCOUNTER — Other Ambulatory Visit: Payer: Self-pay

## 2020-08-24 ENCOUNTER — Encounter: Payer: Self-pay | Admitting: Physical Therapy

## 2020-08-24 ENCOUNTER — Ambulatory Visit: Payer: 59 | Admitting: Physical Therapy

## 2020-08-24 DIAGNOSIS — M5416 Radiculopathy, lumbar region: Secondary | ICD-10-CM | POA: Diagnosis not present

## 2020-08-24 DIAGNOSIS — M6281 Muscle weakness (generalized): Secondary | ICD-10-CM

## 2020-08-24 DIAGNOSIS — R2681 Unsteadiness on feet: Secondary | ICD-10-CM

## 2020-08-24 NOTE — Therapy (Signed)
[facility] [address] [place], [place], [address] Phone: [phone]   Fax:  [phone]  Physical Therapy Treatment  Patient Details  Name: Kayla Berger MRN: [id number] Date of Birth: 06-11-1960 Referring Provider (PT): Donalee Citrin MD   Encounter Date: 08/24/2020   PT End of Session - 08/24/20 0749     Visit Number 12    Number of Visits 16    Date for PT Re-Evaluation 08/31/20    Authorization Type UHC    PT Start Time 0733    PT Stop Time 0814    PT Time Calculation (min) 41 min    Activity Tolerance Patient tolerated treatment well    Behavior During Therapy [facility] for tasks assessed/performed             Past Medical History:  Diagnosis Date   Arthritis    Complication of anesthesia    "hard to awaken"   Dysrhythmia    irregular heartbeat- rx   History of kidney stones    Hypertension    Hypothyroidism    synthroid in past no med now   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   Migraines    occ   PCOS (polycystic ovarian syndrome) 1986   Shoulder pain    Thyroid disease    Trochanteric bursitis of left hip    Vaginal cysts    4 have been removed    Past Surgical History:  Procedure Laterality Date   APPENDECTOMY  1967   CARDIAC CATHETERIZATION Bilateral    cataracts   KYPHOPLASTY N/A 10/31/2016   Procedure: KYPHOPLASTY LUMBAR ONE, LUMBAR TWO AND  LUMBAR THREE;  Surgeon: Venita Lick, MD;  Location: [facility] OR;  Service: Orthopedics;  Laterality: N/A;   nsvd  2001   normal vaginal del   sonobysterogram, laporoscopy  1997   fertility test    There were no vitals filed for this visit.   Subjective Assessment - 08/24/20 0747     Subjective COVID-19 screen performed prior to patient entering clinic. reports she felt good after last PT session but had some discomfort last night after exercises and practicing squats.    Pertinent History compression fx L4/5 and bulging disc, OA, spinal  stenosis, OP, DM, kyphoplasty L1-L3/4 2018, HTN, DM,    Limitations Standing;Walking;Lifting    How long can you sit comfortably? not limited; only when sitting on soft couches    How long can you stand comfortably? "a whole lot better; 10-15 mins (i used to not be able to stand to cook"    How long can you walk comfortably? a quarter of a mile; not quite a half mile as she is afraid of regression - but it is easier.    Diagnostic tests MRI/xray    Patient Stated Goals I would like to be able to have more flexibility and strength in my legs; to be able to lift the trashbag.    Currently in Pain? No/denies                [facility] PT Assessment - 08/24/20 0001       Assessment   Medical Diagnosis lumbar stenosis with neurogenic claudication    Referring Provider (PT) Donalee Citrin MD    Onset Date/Surgical Date 11/29/19    Hand Dominance Right    Next MD Visit 11/09/20  [facility] Adult PT Treatment/Exercise - 08/24/20 0001       Lumbar Exercises: Stretches   Lower Trunk Rotation 5 reps;10 seconds      Lumbar Exercises: Aerobic   Nustep L4 x15 min      Lumbar Exercises: Machines for Strengthening   Cybex Knee Extension 10# 2x10 reps    Cybex Knee Flexion 20# 2x10 reps      Lumbar Exercises: Standing   Functional Squats 15 reps;3 seconds    Functional Squats Limitations focus on posture    Forward Lunge 10 reps;3 seconds    Forward Lunge Limitations focus on posture    Wall Slides 20 reps;2 seconds      Lumbar Exercises: Supine   Bridge 20 reps;3 seconds    Bridge Limitations green theraband                      PT Short Term Goals - 07/21/20 0740       PT SHORT TERM GOAL #1   Title Assess 5x sit to stand  and TUG and set appropriate LTG    Baseline Normal per 07/12/20    Time 2    Period Weeks    Status Achieved               PT Long Term Goals - 08/17/20 0830       PT LONG TERM GOAL #1   Title ind in HEP  and progression and able to verbalize and/or demonstrate correct body mechanics and ADL modifications to prevent further injury.    Baseline issued today 07/21/20    Time 8    Period Weeks    Status Achieved      PT LONG TERM GOAL #2   Title Pt to report decreased pain in the low back and LEs by >= 50% with ADLS.    Baseline 5-6/10 today 08/02/20    Time 8    Period Weeks    Status On-going      PT LONG TERM GOAL #3   Title Patient able to tolerate a walking program at least 3x/wk without increase in sx.    Baseline walking causes increased pain, no consistant walking program at this time 08/02/20    Time 8    Period Weeks    Status On-going      PT LONG TERM GOAL #4   Title Patient able climb stairs with a reciprocal gait pattern and use of UE support for safety only.    Baseline non reciprocal 08/02/20    Time 8    Period Weeks    Status On-going      PT LONG TERM GOAL #5   Title improved Bil LE strength to 4+/5 to improve body mechanics and transfers.    Time 8    Period Weeks    Status On-going                   Plan - 08/24/20 0901     Clinical Impression Statement Patient presented in clinic with reports of no current discomfort. Patient did experience some irritation last night but did some therex but was unsure if she was completing in good form which could have caused the discomfort. Patient unable to complete her walking routine secondary to rainy weather. Patient progressed to more functional activities such as lunges, squats for maintaining posture and correction. Machine strengthening also completed to progress LE strengthening.    Personal Factors and Comorbidities Comorbidity 3+;Time  since onset of injury/illness/exacerbation    Comorbidities compression fx L4/5 and bulging disc, OA, spinal stenosis, OP, DM, kyphoplasty L1-L3/4 2018, HTN    Examination-Activity Limitations Bend;Carry;Dressing;Locomotion Level;Stairs;Stand    Examination-Participation  Restrictions [employer]    Stability/Clinical Decision Making Stable/Uncomplicated    Rehab Potential Good    PT Frequency 2x / week    PT Duration 8 weeks    PT Treatment/Interventions ADLs/Self Care Home Management;Aquatic Therapy;Cryotherapy;Electrical Stimulation;Moist Heat;Stair training;Functional mobility training;Therapeutic activities;Therapeutic exercise;Neuromuscular re-education;Balance training;Patient/family education;Manual techniques;Dry needling    PT Next Visit Plan functional LE strengthening, prone stabilzation; lifting,push/pulling .    PT Home Exercise Plan [id number] (prone lying, bridging)    Consulted and Agree with Plan of Care Patient             Patient will benefit from skilled therapeutic intervention in order to improve the following deficits and impairments:  Pain, Improper body mechanics, Increased muscle spasms, Decreased activity tolerance, Decreased range of motion, Decreased strength, Impaired flexibility, Decreased balance, Impaired sensation  Visit Diagnosis: Radiculopathy, lumbar region  Muscle weakness (generalized)  Unsteadiness on feet     Problem List Patient Active Problem List   Diagnosis Date Noted   Closed compression fracture of L2 lumbar vertebra 10/31/2016   Hyperlipidemia 09/23/2012   Hypothyroid 09/23/2012   Allergic rhinitis 09/23/2012   History of migraine headaches 09/23/2012   IDDM (insulin dependent diabetes mellitus) 06/26/2012   HTN (hypertension) 06/26/2012    Marvell Fuller, PTA 08/24/2020, 9:21 AM  [facility] [address] [place], [place], [address] Phone: [phone]   Fax:  [phone]  Name: Kayla Berger MRN: [id number] Date of Birth: Mar 25, 1960

## 2020-08-29 ENCOUNTER — Other Ambulatory Visit: Payer: Self-pay

## 2020-08-29 ENCOUNTER — Ambulatory Visit: Payer: 59 | Attending: Neurosurgery | Admitting: Physical Therapy

## 2020-08-29 ENCOUNTER — Encounter: Payer: Self-pay | Admitting: Physical Therapy

## 2020-08-29 DIAGNOSIS — M6281 Muscle weakness (generalized): Secondary | ICD-10-CM | POA: Insufficient documentation

## 2020-08-29 DIAGNOSIS — M5416 Radiculopathy, lumbar region: Secondary | ICD-10-CM | POA: Insufficient documentation

## 2020-08-29 DIAGNOSIS — R2681 Unsteadiness on feet: Secondary | ICD-10-CM | POA: Diagnosis present

## 2020-08-29 NOTE — Therapy (Signed)
[facility] [address] [place], [place], [address] Phone: [phone]   Fax:  [phone]  Physical Therapy Treatment  Patient Details  Name: Kayla Berger MRN: [id number] Date of Birth: 1960/10/01 Referring Provider (PT): Donalee Citrin MD   Encounter Date: 08/29/2020   PT End of Session - 08/29/20 0810     Visit Number 13    Number of Visits 16    Date for PT Re-Evaluation 08/31/20    Authorization Type UHC    PT Start Time 0736    PT Stop Time 0817    PT Time Calculation (min) 41 min    Activity Tolerance Patient tolerated treatment well    Behavior During Therapy [facility] for tasks assessed/performed             Past Medical History:  Diagnosis Date   Arthritis    Complication of anesthesia    "hard to awaken"   Dysrhythmia    irregular heartbeat- rx   History of kidney stones    Hypertension    Hypothyroidism    synthroid in past no med now   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   Migraines    occ   PCOS (polycystic ovarian syndrome) 1986   Shoulder pain    Thyroid disease    Trochanteric bursitis of left hip    Vaginal cysts    4 have been removed    Past Surgical History:  Procedure Laterality Date   APPENDECTOMY  1967   CARDIAC CATHETERIZATION Bilateral    cataracts   KYPHOPLASTY N/A 10/31/2016   Procedure: KYPHOPLASTY LUMBAR ONE, LUMBAR TWO AND  LUMBAR THREE;  Surgeon: Venita Lick, MD;  Location[facility];  Service: Orthopedics;  Laterality: N/A;   nsvd  2001   normal vaginal del   sonobysterogram, laporoscopy  1997   fertility test    There were no vitals filed for this visit.   Subjective Assessment - 08/29/20 0806     Subjective COVID-19 screen performed prior to patient entering clinic. Reports she feels as if her legs are stronger but no longer experiencing LE radicular symptoms. Patient reports that she has noticed she leans forward if tired while walking.    Pertinent  History compression fx L4/5 and bulging disc, OA, spinal stenosis, OP, DM, kyphoplasty L1-L3/4 2018, HTN, DM,    Limitations Standing;Walking;Lifting    How long can you sit comfortably? not limited; only when sitting on soft couches    How long can you stand comfortably? "a whole lot better; 10-15 mins (i used to not be able to stand to cook"    How long can you walk comfortably? a quarter of a mile; not quite a half mile as she is afraid of regression - but it is easier.    Diagnostic tests MRI/xray    Patient Stated Goals I would like to be able to have more flexibility and strength in my legs; to be able to lift the trashbag.    Currently in Pain? Yes    Pain Score 3     Pain Location Back    Pain Orientation Lower    Pain Descriptors / Indicators Discomfort    Pain Type Chronic pain    Pain Onset More than a month ago    Pain Frequency Constant                [facility] PT Assessment - 08/29/20 0001  Assessment   Medical Diagnosis lumbar stenosis with neurogenic claudication    Referring Provider (PT) Donalee Citrin MD    Onset Date/Surgical Date 11/29/19    Hand Dominance Right    Next MD Visit 11/09/20      Precautions   Precautions Other (comment)    Precaution Comments OP and continuous glucose monitor CGM      Restrictions   Weight Bearing Restrictions No                           [facility] Adult PT Treatment/Exercise - 08/29/20 0001       Lumbar Exercises: Aerobic   Nustep L4 x15 min      Lumbar Exercises: Machines for Strengthening   Cybex Knee Extension 10# 2x10 reps    Cybex Knee Flexion 30# 2x10 reps      Lumbar Exercises: Standing   Shoulder Extension Strengthening;Both;20 reps;Limitations    Shoulder Extension Limitations Blue XTS    Other Standing Lumbar Exercises B chop/lift blue XTS x10 reps    Other Standing Lumbar Exercises Wall pushups x20 reps      Lumbar Exercises: Seated   Long Arc Quad on Ball AROM;Both;15 reps    Hip  Flexion on Ball AROM;Both;20 reps      Lumbar Exercises: Supine   Bridge 20 reps;5 seconds                      PT Short Term Goals - 07/21/20 0740       PT SHORT TERM GOAL #1   Title Assess 5x sit to stand  and TUG and set appropriate LTG    Baseline Normal per 07/12/20    Time 2    Period Weeks    Status Achieved               PT Long Term Goals - 08/17/20 0830       PT LONG TERM GOAL #1   Title ind in HEP and progression and able to verbalize and/or demonstrate correct body mechanics and ADL modifications to prevent further injury.    Baseline issued today 07/21/20    Time 8    Period Weeks    Status Achieved      PT LONG TERM GOAL #2   Title Pt to report decreased pain in the low back and LEs by >= 50% with ADLS.    Baseline 5-6/10 today 08/02/20    Time 8    Period Weeks    Status On-going      PT LONG TERM GOAL #3   Title Patient able to tolerate a walking program at least 3x/wk without increase in sx.    Baseline walking causes increased pain, no consistant walking program at this time 08/02/20    Time 8    Period Weeks    Status On-going      PT LONG TERM GOAL #4   Title Patient able climb stairs with a reciprocal gait pattern and use of UE support for safety only.    Baseline non reciprocal 08/02/20    Time 8    Period Weeks    Status On-going      PT LONG TERM GOAL #5   Title improved Bil LE strength to 4+/5 to improve body mechanics and transfers.    Time 8    Period Weeks    Status On-going  Plan - 08/29/20 0826     Clinical Impression Statement Patient presented in clinic with reports of limited postural control especially as she fatigues. More focus on postural training today and [facility] stability in sitting. Light contact required while sitting stability exercises completed. Patient no longer experiencing radicular LE symptoms. Patient trying to be more aware of antalgic deviations or forward lean of trunk  during gait as they are more as she fatigues.    Personal Factors and Comorbidities Comorbidity 3+;Time since onset of injury/illness/exacerbation    Comorbidities compression fx L4/5 and bulging disc, OA, spinal stenosis, OP, DM, kyphoplasty L1-L3/4 2018, HTN    Examination-Activity Limitations Bend;Carry;Dressing;Locomotion Level;Stairs;Stand    Examination-Participation Restrictions [employer]    Stability/Clinical Decision Making Stable/Uncomplicated    Rehab Potential Good    PT Frequency 2x / week    PT Duration 8 weeks    PT Treatment/Interventions ADLs/Self Care Home Management;Aquatic Therapy;Cryotherapy;Electrical Stimulation;Moist Heat;Stair training;Functional mobility training;Therapeutic activities;Therapeutic exercise;Neuromuscular re-education;Balance training;Patient/family education;Manual techniques;Dry needling    PT Next Visit Plan functional LE strengthening, prone stabilzation; lifting,push/pulling .    PT Home Exercise Plan [id number] (prone lying, bridging)    Consulted and Agree with Plan of Care Patient             Patient will benefit from skilled therapeutic intervention in order to improve the following deficits and impairments:  Pain, Improper body mechanics, Increased muscle spasms, Decreased activity tolerance, Decreased range of motion, Decreased strength, Impaired flexibility, Decreased balance, Impaired sensation  Visit Diagnosis: Radiculopathy, lumbar region  Muscle weakness (generalized)  Unsteadiness on feet     Problem List Patient Active Problem List   Diagnosis Date Noted   Closed compression fracture of L2 lumbar vertebra 10/31/2016   Hyperlipidemia 09/23/2012   Hypothyroid 09/23/2012   Allergic rhinitis 09/23/2012   History of migraine headaches 09/23/2012   IDDM (insulin dependent diabetes mellitus) 06/26/2012   HTN (hypertension) 06/26/2012    Marvell Fuller, PTA 08/29/2020, 8:29 AM  [facility] [address] [place], [place], [address] Phone: [phone]   Fax:  [phone]  Name: Kayla Berger MRN: [id number] Date of Birth: 03-Jul-1960

## 2020-08-31 ENCOUNTER — Ambulatory Visit: Payer: 59 | Admitting: Physical Therapy

## 2020-08-31 ENCOUNTER — Other Ambulatory Visit: Payer: Self-pay

## 2020-08-31 DIAGNOSIS — M5416 Radiculopathy, lumbar region: Secondary | ICD-10-CM

## 2020-08-31 DIAGNOSIS — R2681 Unsteadiness on feet: Secondary | ICD-10-CM

## 2020-08-31 DIAGNOSIS — M6281 Muscle weakness (generalized): Secondary | ICD-10-CM

## 2020-08-31 NOTE — Therapy (Signed)
[facility] [place], [place], [address] Phone: [phone]   Fax:  [phone]  Physical Therapy Treatment  Patient Details  Name: Kayla Berger MRN: [id number] Date of Birth: Jan 01, 1961 Referring Provider (PT): Kary Kos MD   Encounter Date: 08/31/2020   PT End of Session - 08/31/20 0740     Visit Number 14    Number of Visits 16    Date for PT Re-Evaluation 08/31/20    Authorization Type UHC    PT Start Time 0731    PT Stop Time 0815    PT Time Calculation (min) 44 min    Activity Tolerance Patient tolerated treatment well    Behavior During Therapy [facility] for tasks assessed/performed             Past Medical History:  Diagnosis Date   Arthritis    Complication of anesthesia    "hard to awaken"   Dysrhythmia    irregular heartbeat- rx   History of kidney stones    Hypertension    Hypothyroidism    synthroid in past no med now   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   Migraines    occ   PCOS (polycystic ovarian syndrome) 1986   Shoulder pain    Thyroid disease    Trochanteric bursitis of left hip    Vaginal cysts    4 have been removed    Past Surgical History:  Procedure Laterality Date   APPENDECTOMY  1967   CARDIAC CATHETERIZATION Bilateral    cataracts   KYPHOPLASTY N/A 10/31/2016   Procedure: KYPHOPLASTY LUMBAR ONE, LUMBAR TWO AND  LUMBAR THREE;  Surgeon: Melina Schools, MD;  Location: [facility];  Service: Orthopedics;  Laterality: N/A;   nsvd  2001   normal vaginal del   sonobysterogram, laporoscopy  1997   fertility test    There were no vitals filed for this visit.   Subjective Assessment - 08/31/20 0734     Subjective COVID-19 screen performed prior to patient entering clinic. Patient arrived feeling overall improvement.    Pertinent History compression fx L4/5 and bulging disc, OA, spinal stenosis, OP, DM, kyphoplasty L1-L3/4 2018, HTN, DM,    Limitations  Standing;Walking;Lifting    How long can you sit comfortably? not limited; only when sitting on soft couches    How long can you stand comfortably? "a whole lot better; 10-15 mins (i used to not be able to stand to cook"    How long can you walk comfortably? a quarter of a mile; not quite a half mile as she is afraid of regression - but it is easier.    Diagnostic tests MRI/xray    Patient Stated Goals I would like to be able to have more flexibility and strength in my legs; to be able to lift the trashbag.    Currently in Pain? Yes    Pain Location Back    Pain Orientation Lower    Pain Descriptors / Indicators Discomfort    Pain Type Chronic pain    Pain Onset More than a month ago    Pain Frequency Constant    Aggravating Factors  prolong activity and certain movements    Pain Relieving Factors rest                               [facility] Adult PT Treatment/Exercise - 08/31/20 0001  Lumbar Exercises: Aerobic   Nustep L4 x15 min      Lumbar Exercises: Machines for Strengthening   Cybex Knee Extension 20# 2x10 reps    Cybex Knee Flexion 30# 2x10 reps      Lumbar Exercises: Standing   Shoulder Extension Strengthening;Both;20 reps;Limitations    Shoulder Extension Limitations BLUE XTS    Other Standing Lumbar Exercises B chop/lift blue XTS x10 reps    Other Standing Lumbar Exercises Wall pushups with plus x20 reps      Lumbar Exercises: Seated   Other Seated Lumbar Exercises seated physioball pressdowns 2x20      Lumbar Exercises: Supine   Bridge 20 reps;5 seconds    Bridge Limitations green theraband      Knee/Hip Exercises: Standing   Other Standing Knee Exercises up/down stairs x2                      PT Short Term Goals - 07/21/20 0740       PT SHORT TERM GOAL #1   Title Assess 5x sit to stand  and TUG and set appropriate LTG    Baseline Normal per 07/12/20    Time 2    Period Weeks    Status Achieved               PT  Long Term Goals - 08/31/20 0740       PT LONG TERM GOAL #1   Title ind in HEP and progression and able to verbalize and/or demonstrate correct body mechanics and ADL modifications to prevent further injury.    Baseline issued today 07/21/20    Time 8    Period Weeks    Status Achieved      PT LONG TERM GOAL #2   Title Pt to report decreased pain in the low back and LEs by >= 50% with ADLS.    Baseline met today 50% improved 08/31/20    Time 8    Period Weeks    Status Achieved      PT LONG TERM GOAL #3   Title Patient able to tolerate a walking program at least 3x/wk without increase in sx.    Baseline increased 4 x week with no increased symptoms as long as she performs upright posture 08/31/20    Time 8    Period Weeks    Status Achieved      PT LONG TERM GOAL #4   Title Patient able climb stairs with a reciprocal gait pattern and use of UE support for safety only.    Baseline met 08/31/20    Time 8    Period Weeks    Status Achieved      PT LONG TERM GOAL #5   Title improved Bil LE strength to 4+/5 to improve body mechanics and transfers.    Time 8    Period Weeks    Status On-going                   Plan - 08/31/20 0809     Clinical Impression Statement Patient tolerated treatment well today. Patient able to progress with all exercises with no increased symptoms. Patient has been on a walking program 4 x a week with no increased pain as long as she performs with upright posture. Patient reported 50% improvement or more in symptoms and is able to walk up/down stairs independently today. Patient met goals today with remaing strength goal progressing.    Personal Factors  and Comorbidities Comorbidity 3+;Time since onset of injury/illness/exacerbation    Comorbidities compression fx L4/5 and bulging disc, OA, spinal stenosis, OP, DM, kyphoplasty L1-L3/4 2018, HTN    Examination-Activity Limitations Bend;Carry;Dressing;Locomotion Level;Stairs;Stand     Examination-Participation Restrictions [employer]    Stability/Clinical Decision Making Stable/Uncomplicated    Rehab Potential Good    PT Frequency 2x / week    PT Duration 8 weeks    PT Treatment/Interventions ADLs/Self Care Home Management;Aquatic Therapy;Cryotherapy;Electrical Stimulation;Moist Heat;Stair training;Functional mobility training;Therapeutic activities;Therapeutic exercise;Neuromuscular re-education;Balance training;Patient/family education;Manual techniques;Dry needling    PT Next Visit Plan functional LE strengthening, prone stabilzation; lifting,push/pulling .    Consulted and Agree with Plan of Care Patient             Patient will benefit from skilled therapeutic intervention in order to improve the following deficits and impairments:  Pain, Improper body mechanics, Increased muscle spasms, Decreased activity tolerance, Decreased range of motion, Decreased strength, Impaired flexibility, Decreased balance, Impaired sensation  Visit Diagnosis: Radiculopathy, lumbar region  Muscle weakness (generalized)  Unsteadiness on feet     Problem List Patient Active Problem List   Diagnosis Date Noted   Closed compression fracture of L2 lumbar vertebra 10/31/2016   Hyperlipidemia 09/23/2012   Hypothyroid 09/23/2012   Allergic rhinitis 09/23/2012   History of migraine headaches 09/23/2012   IDDM (insulin dependent diabetes mellitus) 06/26/2012   HTN (hypertension) 06/26/2012    Kayla Berger, PTA 08/31/2020, 8:18 AM  [facility] [address] [place], [place], [address] Phone: [phone]   Fax:  [phone]  Name: Kayla Berger MRN: [id number] Date of Birth: September 13, 1960

## 2020-08-31 NOTE — Therapy (Signed)
[facility] [place], [place], [address] Phone: [phone]   Fax:  [phone]  Physical Therapy Treatment  Patient Details  Name: Kayla Berger MRN: [id number] Date of Birth: Jan 01, 1961 Referring Provider (PT): Kary Kos MD   Encounter Date: 08/31/2020   PT End of Session - 08/31/20 0740     Visit Number 14    Number of Visits 16    Date for PT Re-Evaluation 08/31/20    Authorization Type UHC    PT Start Time 0731    PT Stop Time 0815    PT Time Calculation (min) 44 min    Activity Tolerance Patient tolerated treatment well    Behavior During Therapy [facility] for tasks assessed/performed             Past Medical History:  Diagnosis Date   Arthritis    Complication of anesthesia    "hard to awaken"   Dysrhythmia    irregular heartbeat- rx   History of kidney stones    Hypertension    Hypothyroidism    synthroid in past no med now   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   Migraines    occ   PCOS (polycystic ovarian syndrome) 1986   Shoulder pain    Thyroid disease    Trochanteric bursitis of left hip    Vaginal cysts    4 have been removed    Past Surgical History:  Procedure Laterality Date   APPENDECTOMY  1967   CARDIAC CATHETERIZATION Bilateral    cataracts   KYPHOPLASTY N/A 10/31/2016   Procedure: KYPHOPLASTY LUMBAR ONE, LUMBAR TWO AND  LUMBAR THREE;  Surgeon: Melina Schools, MD;  Location: [place];  Service: Orthopedics;  Laterality: N/A;   nsvd  2001   normal vaginal del   sonobysterogram, laporoscopy  1997   fertility test    There were no vitals filed for this visit.   Subjective Assessment - 08/31/20 0734     Subjective COVID-19 screen performed prior to patient entering clinic. Patient arrived feeling overall improvement.    Pertinent History compression fx L4/5 and bulging disc, OA, spinal stenosis, OP, DM, kyphoplasty L1-L3/4 2018, HTN, DM,    Limitations  Standing;Walking;Lifting    How long can you sit comfortably? not limited; only when sitting on soft couches    How long can you stand comfortably? "a whole lot better; 10-15 mins (i used to not be able to stand to cook"    How long can you walk comfortably? a quarter of a mile; not quite a half mile as she is afraid of regression - but it is easier.    Diagnostic tests MRI/xray    Patient Stated Goals I would like to be able to have more flexibility and strength in my legs; to be able to lift the trashbag.    Currently in Pain? Yes    Pain Location Back    Pain Orientation Lower    Pain Descriptors / Indicators Discomfort    Pain Type Chronic pain    Pain Onset More than a month ago    Pain Frequency Constant    Aggravating Factors  prolong activity and certain movements    Pain Relieving Factors rest                               [facility] Adult PT Treatment/Exercise - 08/31/20 0001  Lumbar Exercises: Aerobic   Nustep L4 x15 min      Lumbar Exercises: Machines for Strengthening   Cybex Knee Extension 20# 2x10 reps    Cybex Knee Flexion 30# 2x10 reps      Lumbar Exercises: Standing   Shoulder Extension Strengthening;Both;20 reps;Limitations    Shoulder Extension Limitations BLUE XTS    Other Standing Lumbar Exercises B chop/lift blue XTS x10 reps    Other Standing Lumbar Exercises Wall pushups with plus x20 reps      Lumbar Exercises: Seated   Other Seated Lumbar Exercises seated physioball pressdowns 2x20      Lumbar Exercises: Supine   Bridge 20 reps;5 seconds    Bridge Limitations green theraband                      PT Short Term Goals - 07/21/20 0740       PT SHORT TERM GOAL #1   Title Assess 5x sit to stand  and TUG and set appropriate LTG    Baseline Normal per 07/12/20    Time 2    Period Weeks    Status Achieved               PT Long Term Goals - 08/31/20 0740       PT LONG TERM GOAL #1   Title ind in HEP and  progression and able to verbalize and/or demonstrate correct body mechanics and ADL modifications to prevent further injury.    Baseline issued today 07/21/20    Time 8    Period Weeks    Status Achieved      PT LONG TERM GOAL #2   Title Pt to report decreased pain in the low back and LEs by >= 50% with ADLS.    Baseline met today 50% improved 08/31/20    Time 8    Period Weeks    Status Achieved      PT LONG TERM GOAL #3   Title Patient able to tolerate a walking program at least 3x/wk without increase in sx.    Baseline increased 4 x week with no increased symptoms as long as she performs upright posture 08/31/20    Time 8    Period Weeks    Status Achieved      PT LONG TERM GOAL #4   Title Patient able climb stairs with a reciprocal gait pattern and use of UE support for safety only.    Baseline met 08/31/20    Time 8    Period Weeks    Status Achieved      PT LONG TERM GOAL #5   Title improved Bil LE strength to 4+/5 to improve body mechanics and transfers.    Time 8    Period Weeks    Status On-going                   Plan - 08/31/20 0809     Clinical Impression Statement Patient tolerated treatment well today. Patient able to progress with all exercises with no increased symptoms. Patient has been on a walking program 4 x a week with no increased pain as long as she performs with upright posture. Patient reported 50% improvement or more in symptoms and is able to walk up/down stairs independently today. Patient met goals today with remaing strength goal progressing.    Personal Factors and Comorbidities Comorbidity 3+;Time since onset of injury/illness/exacerbation    Comorbidities compression fx L4/5 and bulging  disc, OA, spinal stenosis, OP, DM, kyphoplasty L1-L3/4 2018, HTN    Examination-Activity Limitations Bend;Carry;Dressing;Locomotion Level;Stairs;Stand    Examination-Participation Restrictions [employer]    Stability/Clinical Decision Making  Stable/Uncomplicated    Rehab Potential Good    PT Frequency 2x / week    PT Duration 8 weeks    PT Treatment/Interventions ADLs/Self Care Home Management;Aquatic Therapy;Cryotherapy;Electrical Stimulation;Moist Heat;Stair training;Functional mobility training;Therapeutic activities;Therapeutic exercise;Neuromuscular re-education;Balance training;Patient/family education;Manual techniques;Dry needling    PT Next Visit Plan functional LE strengthening, prone stabilzation; lifting,push/pulling .    Consulted and Agree with Plan of Care Patient             Patient will benefit from skilled therapeutic intervention in order to improve the following deficits and impairments:  Pain, Improper body mechanics, Increased muscle spasms, Decreased activity tolerance, Decreased range of motion, Decreased strength, Impaired flexibility, Decreased balance, Impaired sensation  Visit Diagnosis: Radiculopathy, lumbar region  Muscle weakness (generalized)  Unsteadiness on feet     Problem List Patient Active Problem List   Diagnosis Date Noted   Closed compression fracture of L2 lumbar vertebra 10/31/2016   Hyperlipidemia 09/23/2012   Hypothyroid 09/23/2012   Allergic rhinitis 09/23/2012   History of migraine headaches 09/23/2012   IDDM (insulin dependent diabetes mellitus) 06/26/2012   HTN (hypertension) 06/26/2012    Woodward Klem P 08/31/2020, 8:16 AM  [facility] [address] [place], [place], [address] Phone: [phone]   Fax:  [phone]  Name: KEISHLA OYER MRN: [id number] Date of Birth: 03-20-1960

## 2020-09-05 ENCOUNTER — Ambulatory Visit: Payer: 59 | Admitting: Physical Therapy

## 2020-09-05 ENCOUNTER — Other Ambulatory Visit: Payer: Self-pay

## 2020-09-05 DIAGNOSIS — M6281 Muscle weakness (generalized): Secondary | ICD-10-CM

## 2020-09-05 DIAGNOSIS — R2681 Unsteadiness on feet: Secondary | ICD-10-CM

## 2020-09-05 DIAGNOSIS — M5416 Radiculopathy, lumbar region: Secondary | ICD-10-CM | POA: Diagnosis not present

## 2020-09-05 NOTE — Therapy (Signed)
[facility] [place], [place], [address] Phone: [phone]   Fax:  [phone]  Physical Therapy Treatment  Patient Details  Name: Kayla Berger MRN: [id number] Date of Birth: 1960-09-18 Referring Provider (PT): Kary Kos MD   Encounter Date: 09/05/2020   PT End of Session - 09/05/20 0842     Visit Number 15    Number of Visits 16    Date for PT Re-Evaluation 09/08/20    PT Start Time 0732    PT Stop Time 0825    PT Time Calculation (min) 53 min    Activity Tolerance Patient tolerated treatment well    Behavior During Therapy [facility] for tasks assessed/performed             Past Medical History:  Diagnosis Date   Arthritis    Complication of anesthesia    "hard to awaken"   Dysrhythmia    irregular heartbeat- rx   History of kidney stones    Hypertension    Hypothyroidism    synthroid in past no med now   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   Migraines    occ   PCOS (polycystic ovarian syndrome) 1986   Shoulder pain    Thyroid disease    Trochanteric bursitis of left hip    Vaginal cysts    4 have been removed    Past Surgical History:  Procedure Laterality Date   APPENDECTOMY  1967   CARDIAC CATHETERIZATION Bilateral    cataracts   KYPHOPLASTY N/A 10/31/2016   Procedure: KYPHOPLASTY LUMBAR ONE, LUMBAR TWO AND  LUMBAR THREE;  Surgeon: Melina Schools, MD;  Location: [place];  Service: Orthopedics;  Laterality: N/A;   nsvd  2001   normal vaginal del   sonobysterogram, laporoscopy  1997   fertility test    There were no vitals filed for this visit.   Subjective Assessment - 09/05/20 0815     Subjective COVID-19 screen performed prior to patient entering clinic.  Better overall.  Right low back hurts today.    Pertinent History compression fx L4/5 and bulging disc, OA, spinal stenosis, OP, DM, kyphoplasty L1-L3/4 2018, HTN, DM,    How long can you sit comfortably? not  limited; only when sitting on soft couches    How long can you stand comfortably? "a whole lot better; 10-15 mins (i used to not be able to stand to cook"    How long can you walk comfortably? a quarter of a mile; not quite a half mile as she is afraid of regression - but it is easier.    Diagnostic tests MRI/xray    Patient Stated Goals I would like to be able to have more flexibility and strength in my legs; to be able to lift the trashbag.    Currently in Pain? Yes    Pain Score 3     Pain Location Back    Pain Orientation Right    Pain Descriptors / Indicators Discomfort    Pain Type Chronic pain    Pain Onset More than a month ago                               [facility] Adult PT Treatment/Exercise - 09/05/20 0001       Exercises   Exercises Knee/Hip      Lumbar Exercises: Aerobic   Nustep Level 4 x 15  minutes.      Lumbar Exercises: Standing   Other Standing Lumbar Exercises Standing draw-in while performing scap retraction with blue tubing on XTS x 2 minutes.      Modalities   Modalities Electrical Stimulation;Moist Heat      Moist Heat Therapy   Number Minutes Moist Heat 20 Minutes    Moist Heat Location Lumbar Spine      Electrical Stimulation   Electrical Stimulation Location Right low back    Electrical Stimulation Action Pre-mod.    Electrical Stimulation Parameters 80-150 Hz x 20 minutes.    Electrical Stimulation Goals Pain;Tone      Manual Therapy   Soft tissue mobilization Left sdly position with pillow between knees for comfort:  STW/M x 7 minutes to patient's right low back with ischemic release technique utilized.                      PT Short Term Goals - 07/21/20 0740       PT SHORT TERM GOAL #1   Title Assess 5x sit to stand  and TUG and set appropriate LTG    Baseline Normal per 07/12/20    Time 2    Period Weeks    Status Achieved               PT Long Term Goals - 08/31/20 0740       PT LONG TERM GOAL  #1   Title ind in HEP and progression and able to verbalize and/or demonstrate correct body mechanics and ADL modifications to prevent further injury.    Baseline issued today 07/21/20    Time 8    Period Weeks    Status Achieved      PT LONG TERM GOAL #2   Title Pt to report decreased pain in the low back and LEs by >= 50% with ADLS.    Baseline met today 50% improved 08/31/20    Time 8    Period Weeks    Status Achieved      PT LONG TERM GOAL #3   Title Patient able to tolerate a walking program at least 3x/wk without increase in sx.    Baseline increased 4 x week with no increased symptoms as long as she performs upright posture 08/31/20    Time 8    Period Weeks    Status Achieved      PT LONG TERM GOAL #4   Title Patient able climb stairs with a reciprocal gait pattern and use of UE support for safety only.    Baseline met 08/31/20    Time 8    Period Weeks    Status Achieved      PT LONG TERM GOAL #5   Title improved Bil LE strength to 4+/5 to improve body mechanics and transfers.    Time 8    Period Weeks    Status On-going                   Plan - 09/05/20 0845     Clinical Impression Statement Patient with a tender trigger point in her right low back.  She did very well with STW/M and felt better after treatment.    Personal Factors and Comorbidities Comorbidity 3+;Time since onset of injury/illness/exacerbation    Comorbidities compression fx L4/5 and bulging disc, OA, spinal stenosis, OP, DM, kyphoplasty L1-L3/4 2018, HTN    Examination-Participation Restrictions [facility]    Stability/Clinical Decision Making Stable/Uncomplicated  Rehab Potential Good    PT Frequency 2x / week    PT Duration 8 weeks    PT Treatment/Interventions ADLs/Self Care Home Management;Aquatic Therapy;Cryotherapy;Electrical Stimulation;Moist Heat;Stair training;Functional mobility training;Therapeutic activities;Therapeutic exercise;Neuromuscular re-education;Balance  training;Patient/family education;Manual techniques;Dry needling    PT Next Visit Plan functional LE strengthening, prone stabilzation; lifting,push/pulling .    Consulted and Agree with Plan of Care Patient             Patient will benefit from skilled therapeutic intervention in order to improve the following deficits and impairments:  Pain, Improper body mechanics, Increased muscle spasms, Decreased activity tolerance, Decreased range of motion, Decreased strength, Impaired flexibility, Decreased balance, Impaired sensation  Visit Diagnosis: Radiculopathy, lumbar region  Muscle weakness (generalized)  Unsteadiness on feet     Problem List Patient Active Problem List   Diagnosis Date Noted   Closed compression fracture of L2 lumbar vertebra 10/31/2016   Hyperlipidemia 09/23/2012   Hypothyroid 09/23/2012   Allergic rhinitis 09/23/2012   History of migraine headaches 09/23/2012   IDDM (insulin dependent diabetes mellitus) 06/26/2012   HTN (hypertension) 06/26/2012    Teniya Filter, [place] MPT 09/05/2020, 8:48 AM  [facility] [address] [place], [place], [address] Phone: [phone]   Fax:  [phone]  Name: ALAYNA MABE MRN: [id number] Date of Birth: [birth year]

## 2020-09-05 NOTE — Addendum Note (Signed)
Addended by: Houa Ackert, [place] W on: 09/05/2020 08:52 AM   Modules accepted: Orders

## 2020-09-08 ENCOUNTER — Other Ambulatory Visit: Payer: Self-pay

## 2020-09-08 ENCOUNTER — Ambulatory Visit: Payer: 59 | Admitting: Physical Therapy

## 2020-09-08 DIAGNOSIS — M6281 Muscle weakness (generalized): Secondary | ICD-10-CM

## 2020-09-08 DIAGNOSIS — M5416 Radiculopathy, lumbar region: Secondary | ICD-10-CM | POA: Diagnosis not present

## 2020-09-08 DIAGNOSIS — R2681 Unsteadiness on feet: Secondary | ICD-10-CM

## 2020-09-08 NOTE — Therapy (Signed)
[facility] [place], [place], [address] Phone: [phone]   Fax:  [phone]  Physical Therapy Treatment  Patient Details  Name: Kayla Berger MRN: [id number] Date of Birth: Jul 05, 1960 Referring Provider (PT): Kary Kos MD   Encounter Date: 09/08/2020   PT End of Session - 09/08/20 0813     Visit Number 16    Number of Visits 20    Date for PT Re-Evaluation 09/22/20    Authorization Type UHC    PT Start Time 0735    PT Stop Time 0825    PT Time Calculation (min) 50 min    Activity Tolerance Patient tolerated treatment well    Behavior During Therapy [facility] for tasks assessed/performed             Past Medical History:  Diagnosis Date   Arthritis    Complication of anesthesia    "hard to awaken"   Dysrhythmia    irregular heartbeat- rx   History of kidney stones    Hypertension    Hypothyroidism    synthroid in past no med now   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   Migraines    occ   PCOS (polycystic ovarian syndrome) 1986   Shoulder pain    Thyroid disease    Trochanteric bursitis of left hip    Vaginal cysts    4 have been removed    Past Surgical History:  Procedure Laterality Date   APPENDECTOMY  1967   CARDIAC CATHETERIZATION Bilateral    cataracts   KYPHOPLASTY N/A 10/31/2016   Procedure: KYPHOPLASTY LUMBAR ONE, LUMBAR TWO AND  LUMBAR THREE;  Surgeon: Melina Schools, MD;  Location: [place];  Service: Orthopedics;  Laterality: N/A;   nsvd  2001   normal vaginal del   sonobysterogram, laporoscopy  1997   fertility test    There were no vitals filed for this visit.   Subjective Assessment - 09/08/20 0810     Subjective COVID-19 screen performed prior to patient entering clinic.  Much better since last treatment.    Pertinent History compression fx L4/5 and bulging disc, OA, spinal stenosis, OP, DM, kyphoplasty L1-L3/4 2018, HTN, DM,    How long can you sit  comfortably? not limited; only when sitting on soft couches    How long can you stand comfortably? "a whole lot better; 10-15 mins (i used to not be able to stand to cook"    How long can you walk comfortably? a quarter of a mile; not quite a half mile as she is afraid of regression - but it is easier.    Diagnostic tests MRI/xray    Patient Stated Goals I would like to be able to have more flexibility and strength in my legs; to be able to lift the trashbag.    Currently in Pain? Yes    Pain Score 2     Pain Location Back    Pain Orientation Right    Pain Descriptors / Indicators Dull;Discomfort    Pain Type Chronic pain    Pain Onset More than a month ago                               [facility] Adult PT Treatment/Exercise - 09/08/20 0001       Exercises   Exercises Knee/Hip      Lumbar Exercises: Aerobic   Nustep Level  4 x 8 minutes.      Modalities   Modalities Electrical Stimulation;Moist Heat      Moist Heat Therapy   Number Minutes Moist Heat 20 Minutes    Moist Heat Location Lumbar Spine      Electrical Stimulation   Electrical Stimulation Location Right low back    Electrical Stimulation Action IFC at 80-150 Hz.    Electrical Stimulation Parameters 40% scan x 20 minutes.    Electrical Stimulation Goals Pain;Tone      Manual Therapy   Soft tissue mobilization Left sdly position with folded pillow between knees for comfort:  STW/M x 15 minutes to patient's left low back musculature to reduce tone and pain.                      PT Short Term Goals - 07/21/20 0740       PT SHORT TERM GOAL #1   Title Assess 5x sit to stand  and TUG and set appropriate LTG    Baseline Normal per 07/12/20    Time 2    Period Weeks    Status Achieved               PT Long Term Goals - 08/31/20 0740       PT LONG TERM GOAL #1   Title ind in HEP and progression and able to verbalize and/or demonstrate correct body mechanics and ADL modifications  to prevent further injury.    Baseline issued today 07/21/20    Time 8    Period Weeks    Status Achieved      PT LONG TERM GOAL #2   Title Pt to report decreased pain in the low back and LEs by >= 50% with ADLS.    Baseline met today 50% improved 08/31/20    Time 8    Period Weeks    Status Achieved      PT LONG TERM GOAL #3   Title Patient able to tolerate a walking program at least 3x/wk without increase in sx.    Baseline increased 4 x week with no increased symptoms as long as she performs upright posture 08/31/20    Time 8    Period Weeks    Status Achieved      PT LONG TERM GOAL #4   Title Patient able climb stairs with a reciprocal gait pattern and use of UE support for safety only.    Baseline met 08/31/20    Time 8    Period Weeks    Status Achieved      PT LONG TERM GOAL #5   Title improved Bil LE strength to 4+/5 to improve body mechanics and transfers.    Time 8    Period Weeks    Status On-going                   Plan - 09/08/20 0841     Clinical Impression Statement Patient is doing much better since beginning PT and presented to the clinic today with a low pain-level and stating she is now able to do more at home.  Patient would like to continue PT at this time.    Personal Factors and Comorbidities Comorbidity 3+;Time since onset of injury/illness/exacerbation    Comorbidities compression fx L4/5 and bulging disc, OA, spinal stenosis, OP, DM, kyphoplasty L1-L3/4 2018, HTN    Examination-Activity Limitations Bend;Carry;Dressing;Locomotion Level;Stairs;Stand    Examination-Participation Restrictions [employer]    Stability/Clinical  Decision Making Stable/Uncomplicated    Rehab Potential Good    PT Frequency 2x / week    PT Duration 8 weeks    PT Treatment/Interventions ADLs/Self Care Home Management;Aquatic Therapy;Cryotherapy;Electrical Stimulation;Moist Heat;Stair training;Functional mobility training;Therapeutic activities;Therapeutic  exercise;Neuromuscular re-education;Balance training;Patient/family education;Manual techniques;Dry needling    PT Next Visit Plan functional LE strengthening, prone stabilzation; lifting,push/pulling .    Consulted and Agree with Plan of Care Patient             Patient will benefit from skilled therapeutic intervention in order to improve the following deficits and impairments:  Pain, Improper body mechanics, Increased muscle spasms, Decreased activity tolerance, Decreased range of motion, Decreased strength, Impaired flexibility, Decreased balance, Impaired sensation  Visit Diagnosis: Radiculopathy, lumbar region - Plan: PT plan of care cert/re-cert  Muscle weakness (generalized) - Plan: PT plan of care cert/re-cert  Unsteadiness on feet - Plan: PT plan of care cert/re-cert     Problem List Patient Active Problem List   Diagnosis Date Noted   Closed compression fracture of L2 lumbar vertebra 10/31/2016   Hyperlipidemia 09/23/2012   Hypothyroid 09/23/2012   Allergic rhinitis 09/23/2012   History of migraine headaches 09/23/2012   IDDM (insulin dependent diabetes mellitus) 06/26/2012   HTN (hypertension) 06/26/2012    Letanya Froh, [place] MPT 09/08/2020, 8:51 AM  [facility] [address] [place], [place], [address] Phone: [phone]   Fax:  [phone]  Name: Kayla Berger MRN: [id number] Date of Birth: [birth year]

## 2020-09-12 ENCOUNTER — Other Ambulatory Visit: Payer: Self-pay

## 2020-09-12 ENCOUNTER — Ambulatory Visit: Payer: 59

## 2020-09-12 DIAGNOSIS — M5416 Radiculopathy, lumbar region: Secondary | ICD-10-CM

## 2020-09-12 DIAGNOSIS — M6281 Muscle weakness (generalized): Secondary | ICD-10-CM

## 2020-09-12 DIAGNOSIS — R2681 Unsteadiness on feet: Secondary | ICD-10-CM

## 2020-09-12 NOTE — Therapy (Signed)
[facility] [place], [place], [address] Phone: [phone]   Fax:  [phone]  Physical Therapy Treatment  Patient Details  Name: Kayla Berger MRN: [id number] Date of Birth: December 18, 1960 Referring Provider (PT): Kary Kos MD   Encounter Date: 09/12/2020   PT End of Session - 09/12/20 0959     Visit Number 17    Number of Visits 20    Date for PT Re-Evaluation 09/22/20    Authorization Type UHC    PT Start Time 0730    PT Stop Time 0815    PT Time Calculation (min) 45 min    Activity Tolerance Patient tolerated treatment well    Behavior During Therapy [facility] for tasks assessed/performed             Past Medical History:  Diagnosis Date   Arthritis    Complication of anesthesia    "hard to awaken"   Dysrhythmia    irregular heartbeat- rx   History of kidney stones    Hypertension    Hypothyroidism    synthroid in past no med now   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   Migraines    occ   PCOS (polycystic ovarian syndrome) 1986   Shoulder pain    Thyroid disease    Trochanteric bursitis of left hip    Vaginal cysts    4 have been removed    Past Surgical History:  Procedure Laterality Date   APPENDECTOMY  1967   CARDIAC CATHETERIZATION Bilateral    cataracts   KYPHOPLASTY N/A 10/31/2016   Procedure: KYPHOPLASTY LUMBAR ONE, LUMBAR TWO AND  LUMBAR THREE;  Surgeon: Melina Schools, MD;  Location: [place];  Service: Orthopedics;  Laterality: N/A;   nsvd  2001   normal vaginal del   sonobysterogram, laporoscopy  1997   fertility test    There were no vitals filed for this visit.   Subjective Assessment - 09/12/20 0745     Subjective COVID-19 screen performed prior to patient entering clinic. Patient states that she is concerned about ending PT without being confident in her success with walking a mil eor more or being able to walk up a hill. She states that she has a lot of  exercises that she remembers but feels there are too many to do that she does not feel independent entirely. She discussed her routine and only has complaints about doing hip bridges or low incline pushups. She also wants to increase her core strength. She anticipates getting a treadmill at the end of the year.    Pertinent History compression fx L4/5 and bulging disc, OA, spinal stenosis, OP, DM, kyphoplasty L1-L3/4 2018, HTN, DM,    Limitations Standing;Walking;Lifting    How long can you sit comfortably? not limited; only when sitting on soft couches    How long can you stand comfortably? "a whole lot better; 10-15 mins (i used to not be able to stand to cook"    How long can you walk comfortably? a quarter of a mile; not quite a half mile as she is afraid of regression - but it is easier.    Diagnostic tests MRI/xray    Patient Stated Goals I would like to be able to have more flexibility and strength in my legs; to be able to lift the trashbag.    Currently in Pain? Yes    Pain Score 2     Pain Location Back  Pain Orientation Right    Pain Onset More than a month ago    Pain Frequency Constant                    [facility] Adult PT Treatment/Exercise - 09/12/20 0001       Exercises   Exercises Lumbar      Lumbar Exercises: Stretches   Active Hamstring Stretch 5 reps;10 seconds    Active Hamstring Stretch Limitations walking    Passive Hamstring Stretch --      Lumbar Exercises: Aerobic   Tread Mill Walking Challenge 1 mile for goal assessment and HEP update   Patient performed at self-selected pace (1.4-2.17mph; 0% incline) 27 mins     Lumbar Exercises: Standing   Other Standing Lumbar Exercises pull downs with marching x 15 each      Lumbar Exercises: Seated   Other Seated Lumbar Exercises seated physioball pressdowns x 2 mins      Lumbar Exercises: Quadruped   Other Quadruped Lumbar Exercises Primal pushup x 10               PT Short Term Goals - 07/21/20  0740       PT SHORT TERM GOAL #1   Title Assess 5x sit to stand  and TUG and set appropriate LTG    Baseline Normal per 07/12/20    Time 2    Period Weeks    Status Achieved               PT Long Term Goals - 08/31/20 0740       PT LONG TERM GOAL #1   Title ind in HEP and progression and able to verbalize and/or demonstrate correct body mechanics and ADL modifications to prevent further injury.    Baseline issued today 07/21/20    Time 8    Period Weeks    Status Achieved      PT LONG TERM GOAL #2   Title Pt to report decreased pain in the low back and LEs by >= 50% with ADLS.    Baseline met today 50% improved 08/31/20    Time 8    Period Weeks    Status Achieved      PT LONG TERM GOAL #3   Title Patient able to tolerate a walking program at least 3x/wk without increase in sx.    Baseline increased 4 x week with no increased symptoms as long as she performs upright posture 08/31/20    Time 8    Period Weeks    Status Achieved      PT LONG TERM GOAL #4   Title Patient able climb stairs with a reciprocal gait pattern and use of UE support for safety only.    Baseline met 08/31/20    Time 8    Period Weeks    Status Achieved      PT LONG TERM GOAL #5   Title improved Bil LE strength to 4+/5 to improve body mechanics and transfers.    Time 8    Period Weeks    Status On-going                   Plan - 09/12/20 0959     Clinical Impression Statement Therapy session today encouraged goal assesment of walking on treadmill for one mile and reviewing symptom management techniques. Progression of two core exercises were provided. Patient was educated that new muscle engagement may lead to expected but tolerable  soreness. Skilled PT recommended to continue as indicated.    Personal Factors and Comorbidities Comorbidity 3+;Time since onset of injury/illness/exacerbation    Comorbidities compression fx L4/5 and bulging disc, OA, spinal stenosis, OP, DM, kyphoplasty  L1-L3/4 2018, HTN    Examination-Activity Limitations Bend;Carry;Dressing;Locomotion Level;Stairs;Stand    Examination-Participation Restrictions [employer]    Clinical Decision Making Low    Rehab Potential Good    PT Treatment/Interventions ADLs/Self Care Home Management;Aquatic Therapy;Cryotherapy;Electrical Stimulation;Moist Heat;Stair training;Functional mobility training;Therapeutic activities;Therapeutic exercise;Neuromuscular re-education;Balance training;Patient/family education;Manual techniques;Dry needling    PT Next Visit Plan functional LE strengthening, quadruped core stabilzation; lifting,push/pulling .    Consulted and Agree with Plan of Care Patient             Patient will benefit from skilled therapeutic intervention in order to improve the following deficits and impairments:  Pain, Improper body mechanics, Increased muscle spasms, Decreased activity tolerance, Decreased range of motion, Decreased strength, Impaired flexibility, Decreased balance, Impaired sensation  Visit Diagnosis: Radiculopathy, lumbar region  Muscle weakness (generalized)  Unsteadiness on feet     Problem List Patient Active Problem List   Diagnosis Date Noted   Closed compression fracture of L2 lumbar vertebra 10/31/2016   Hyperlipidemia 09/23/2012   Hypothyroid 09/23/2012   Allergic rhinitis 09/23/2012   History of migraine headaches 09/23/2012   IDDM (insulin dependent diabetes mellitus) 06/26/2012   HTN (hypertension) 06/26/2012    Marylou Mccoy PT, DPT 09/12/2020, [facility] [address] [place], [place], [address] Phone: [phone]   Fax:  [phone]  Name: RIKA DAUGHDRILL MRN: [id number] Date of Birth: 02/03/1960

## 2020-09-19 ENCOUNTER — Ambulatory Visit: Payer: 59 | Admitting: Physical Therapy

## 2020-09-19 ENCOUNTER — Other Ambulatory Visit: Payer: Self-pay

## 2020-09-19 ENCOUNTER — Encounter: Payer: Self-pay | Admitting: Physical Therapy

## 2020-09-19 DIAGNOSIS — R2681 Unsteadiness on feet: Secondary | ICD-10-CM

## 2020-09-19 DIAGNOSIS — M5416 Radiculopathy, lumbar region: Secondary | ICD-10-CM

## 2020-09-19 DIAGNOSIS — M6281 Muscle weakness (generalized): Secondary | ICD-10-CM

## 2020-09-19 NOTE — Therapy (Signed)
[facility] [place], [place], [address] Phone: [phone]   Fax:  [phone]  Physical Therapy Treatment  Patient Details  Name: Kayla Berger MRN: [id number] Date of Birth: 07-03-1960 Referring Provider (PT): Kary Kos MD   Encounter Date: 09/19/2020   PT End of Session - 09/19/20 0748     Visit Number 18    Number of Visits 20    Date for PT Re-Evaluation 09/22/20    Authorization Type UHC    PT Start Time 0732    PT Stop Time 0819    PT Time Calculation (min) 47 min    Activity Tolerance Patient tolerated treatment well    Behavior During Therapy [facility] for tasks assessed/performed             Past Medical History:  Diagnosis Date   Arthritis    Complication of anesthesia    "hard to awaken"   Dysrhythmia    irregular heartbeat- rx   History of kidney stones    Hypertension    Hypothyroidism    synthroid in past no med now   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   Migraines    occ   PCOS (polycystic ovarian syndrome) 1986   Shoulder pain    Thyroid disease    Trochanteric bursitis of left hip    Vaginal cysts    4 have been removed    Past Surgical History:  Procedure Laterality Date   APPENDECTOMY  1967   CARDIAC CATHETERIZATION Bilateral    cataracts   KYPHOPLASTY N/A 10/31/2016   Procedure: KYPHOPLASTY LUMBAR ONE, LUMBAR TWO AND  LUMBAR THREE;  Surgeon: Melina Schools, MD;  Location: [place];  Service: Orthopedics;  Laterality: N/A;   nsvd  2001   normal vaginal del   sonobysterogram, laporoscopy  1997   fertility test    There were no vitals filed for this visit.   Subjective Assessment - 09/19/20 0747     Subjective COVID-19 screen performed prior to patient entering clinic. Reports that she tripped over a rock on saturday and jolted herself to regain balance. Took pain medication as stretching did not alleviate pain and is feeling some better today.     Pertinent History compression fx L4/5 and bulging disc, OA, spinal stenosis, OP, DM, kyphoplasty L1-L3/4 2018, HTN, DM,    Limitations Standing;Walking;Lifting    How long can you sit comfortably? not limited; only when sitting on soft couches    How long can you stand comfortably? "a whole lot better; 10-15 mins (i used to not be able to stand to cook"    How long can you walk comfortably? a quarter of a mile; not quite a half mile as she is afraid of regression - but it is easier.    Diagnostic tests MRI/xray    Patient Stated Goals I would like to be able to have more flexibility and strength in my legs; to be able to lift the trashbag.    Currently in Pain? Yes    Pain Score 2     Pain Location Back    Pain Orientation Right    Pain Descriptors / Indicators Discomfort    Pain Type Chronic pain    Pain Onset More than a month ago    Pain Frequency Constant                [facility] PT Assessment - 09/19/20 0001  Assessment   Medical Diagnosis lumbar stenosis with neurogenic claudication    Referring Provider (PT) Kary Kos MD    Onset Date/Surgical Date 11/29/19    Hand Dominance Right    Next MD Visit 11/09/20      Precautions   Precautions Other (comment)    Precaution Comments OP and continuous glucose monitor CGM                           [facility] Adult PT Treatment/Exercise - 09/19/20 0001       Lumbar Exercises: Aerobic   Nustep L5 x19 min (1 mi)      Lumbar Exercises: Machines for Strengthening   Cybex Lumbar Extension 90# x10 reps    Leg Press 2 pl, seat 6 x15 reps      Lumbar Exercises: Standing   Forward Lunge 10 reps;3 seconds    Forward Lunge Limitations 6# medicine ball      Lumbar Exercises: Seated   Sit to Stand 15 reps;Limitations    Sit to Stand Limitations with hand slides for hinge motion      Modalities   Modalities Electrical Stimulation      Electrical Stimulation   Electrical Stimulation Location B low back     Electrical Stimulation Action Pre-Mod    Electrical Stimulation Parameters 80-150 hz x10 min    Electrical Stimulation Goals Pain                      PT Short Term Goals - 07/21/20 0740       PT SHORT TERM GOAL #1   Title Assess 5x sit to stand  and TUG and set appropriate LTG    Baseline Normal per 07/12/20    Time 2    Period Weeks    Status Achieved               PT Long Term Goals - 08/31/20 0740       PT LONG TERM GOAL #1   Title ind in HEP and progression and able to verbalize and/or demonstrate correct body mechanics and ADL modifications to prevent further injury.    Baseline issued today 07/21/20    Time 8    Period Weeks    Status Achieved      PT LONG TERM GOAL #2   Title Pt to report decreased pain in the low back and LEs by >= 50% with ADLS.    Baseline met today 50% improved 08/31/20    Time 8    Period Weeks    Status Achieved      PT LONG TERM GOAL #3   Title Patient able to tolerate a walking program at least 3x/wk without increase in sx.    Baseline increased 4 x week with no increased symptoms as long as she performs upright posture 08/31/20    Time 8    Period Weeks    Status Achieved      PT LONG TERM GOAL #4   Title Patient able climb stairs with a reciprocal gait pattern and use of UE support for safety only.    Baseline met 08/31/20    Time 8    Period Weeks    Status Achieved      PT LONG TERM GOAL #5   Title improved Bil LE strength to 4+/5 to improve body mechanics and transfers.    Time 8    Period Weeks  Status On-going                   Plan - 09/19/20 0856     Clinical Impression Statement Patient presented in clinic with minimal LBP although she had an acute flare up over the weekend. Patient progressed to more functional strengthening exercises. Patient demonstrated inbalance with forward lunges but able to self correct. Hinge training for proper squat technique conducted today as well. Normal stimulation  response noted following removal of the modality.    Personal Factors and Comorbidities Comorbidity 3+;Time since onset of injury/illness/exacerbation    Comorbidities compression fx L4/5 and bulging disc, OA, spinal stenosis, OP, DM, kyphoplasty L1-L3/4 2018, HTN    Examination-Activity Limitations Bend;Carry;Dressing;Locomotion Level;Stairs;Stand    Examination-Participation Restrictions [employer]    Stability/Clinical Decision Making Stable/Uncomplicated    Rehab Potential Good    PT Frequency 2x / week    PT Duration 8 weeks    PT Treatment/Interventions ADLs/Self Care Home Management;Aquatic Therapy;Cryotherapy;Electrical Stimulation;Moist Heat;Stair training;Functional mobility training;Therapeutic activities;Therapeutic exercise;Neuromuscular re-education;Balance training;Patient/family education;Manual techniques;Dry needling    PT Next Visit Plan functional LE strengthening, quadruped core stabilzation; lifting,push/pulling .    PT Home Exercise Plan [id number] (prone lying, bridging)    Consulted and Agree with Plan of Care Patient             Patient will benefit from skilled therapeutic intervention in order to improve the following deficits and impairments:  Pain, Improper body mechanics, Increased muscle spasms, Decreased activity tolerance, Decreased range of motion, Decreased strength, Impaired flexibility, Decreased balance, Impaired sensation  Visit Diagnosis: Radiculopathy, lumbar region  Muscle weakness (generalized)  Unsteadiness on feet     Problem List Patient Active Problem List   Diagnosis Date Noted   Closed compression fracture of L2 lumbar vertebra 10/31/2016   Hyperlipidemia 09/23/2012   Hypothyroid 09/23/2012   Allergic rhinitis 09/23/2012   History of migraine headaches 09/23/2012   IDDM (insulin dependent diabetes mellitus) 06/26/2012   HTN (hypertension) 06/26/2012    Standley Brooking, PTA 09/19/2020, 9:16 AM  [facility] [place], [place], [address] Phone: [phone]   Fax:  [phone]  Name: Kayla Berger MRN: [id number] Date of Birth: [birth year]

## 2020-09-26 ENCOUNTER — Ambulatory Visit: Payer: 59 | Admitting: Physical Therapy

## 2020-09-26 ENCOUNTER — Encounter: Payer: Self-pay | Admitting: Physical Therapy

## 2020-09-26 ENCOUNTER — Other Ambulatory Visit: Payer: Self-pay

## 2020-09-26 DIAGNOSIS — M6281 Muscle weakness (generalized): Secondary | ICD-10-CM

## 2020-09-26 DIAGNOSIS — M5416 Radiculopathy, lumbar region: Secondary | ICD-10-CM

## 2020-09-26 DIAGNOSIS — R2681 Unsteadiness on feet: Secondary | ICD-10-CM

## 2020-09-26 NOTE — Therapy (Signed)
[facility] [place], [place], [address] Phone: [phone]   Fax:  [phone]  Physical Therapy Treatment  Patient Details  Name: Kayla Berger MRN: [id number] Date of Birth: 1960/08/11 Referring Provider (PT): Kary Kos MD   Encounter Date: 09/26/2020   PT End of Session - 09/26/20 0752     Visit Number 19    Number of Visits 20    Date for PT Re-Evaluation 09/22/20    Authorization Type UHC    PT Start Time 0738    PT Stop Time 0814   late arrival   PT Time Calculation (min) 36 min    Activity Tolerance Patient tolerated treatment well    Behavior During Therapy [facility] for tasks assessed/performed             Past Medical History:  Diagnosis Date   Arthritis    Complication of anesthesia    "hard to awaken"   Dysrhythmia    irregular heartbeat- rx   History of kidney stones    Hypertension    Hypothyroidism    synthroid in past no med now   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   Migraines    occ   PCOS (polycystic ovarian syndrome) 1986   Shoulder pain    Thyroid disease    Trochanteric bursitis of left hip    Vaginal cysts    4 have been removed    Past Surgical History:  Procedure Laterality Date   APPENDECTOMY  1967   CARDIAC CATHETERIZATION Bilateral    cataracts   KYPHOPLASTY N/A 10/31/2016   Procedure: KYPHOPLASTY LUMBAR ONE, LUMBAR TWO AND  LUMBAR THREE;  Surgeon: Melina Schools, MD;  Location: [place];  Service: Orthopedics;  Laterality: N/A;   nsvd  2001   normal vaginal del   sonobysterogram, laporoscopy  1997   fertility test    There were no vitals filed for this visit.   Subjective Assessment - 09/26/20 0751     Subjective COVID-19 screen performed prior to patient entering clinic. Reports her back is doing well and is walking 1.5 mi a day several days a week. [place] this weekend trying to walk backwards with her sister.    Pertinent History compression fx  L4/5 and bulging disc, OA, spinal stenosis, OP, DM, kyphoplasty L1-L3/4 2018, HTN, DM,    Limitations Standing;Walking;Lifting    How long can you sit comfortably? not limited; only when sitting on soft couches    How long can you stand comfortably? "a whole lot better; 10-15 mins (i used to not be able to stand to cook"    How long can you walk comfortably? a quarter of a mile; not quite a half mile as she is afraid of regression - but it is easier.    Diagnostic tests MRI/xray    Patient Stated Goals I would like to be able to have more flexibility and strength in my legs; to be able to lift the trashbag.    Currently in Pain? No/denies                [facility] PT Assessment - 09/26/20 0001       Assessment   Medical Diagnosis lumbar stenosis with neurogenic claudication    Referring Provider (PT) Kary Kos MD    Onset Date/Surgical Date 11/29/19    Hand Dominance Right    Next MD Visit 11/09/20      Precautions   Precautions  Other (comment)    Precaution Comments OP and continuous glucose monitor CGM                           [facility] Adult PT Treatment/Exercise - 09/26/20 0001       Lumbar Exercises: Aerobic   Nustep L4 x15 min      Lumbar Exercises: Machines for Strengthening   Cybex Knee Extension 10# 2x10 reps    Cybex Knee Flexion 20# 2x10 reps    Leg Press 2 pl, seat 6 2x10 reps      Lumbar Exercises: Standing   Forward Lunge 10 reps;3 seconds    Forward Lunge Limitations 6# medicine ball    Row Strengthening;Both;10 reps    Row Limitations Blue XTS    Shoulder Extension Strengthening;Both;15 reps;Limitations    Shoulder Extension Limitations Blue XTS      Lumbar Exercises: Seated   Sit to Stand 20 reps;Limitations    Sit to Stand Limitations short stop position for technique training                      PT Short Term Goals - 07/21/20 0740       PT SHORT TERM GOAL #1   Title Assess 5x sit to stand  and TUG and set appropriate  LTG    Baseline Normal per 07/12/20    Time 2    Period Weeks    Status Achieved               PT Long Term Goals - 08/31/20 0740       PT LONG TERM GOAL #1   Title ind in HEP and progression and able to verbalize and/or demonstrate correct body mechanics and ADL modifications to prevent further injury.    Baseline issued today 07/21/20    Time 8    Period Weeks    Status Achieved      PT LONG TERM GOAL #2   Title Pt to report decreased pain in the low back and LEs by >= 50% with ADLS.    Baseline met today 50% improved 08/31/20    Time 8    Period Weeks    Status Achieved      PT LONG TERM GOAL #3   Title Patient able to tolerate a walking program at least 3x/wk without increase in sx.    Baseline increased 4 x week with no increased symptoms as long as she performs upright posture 08/31/20    Time 8    Period Weeks    Status Achieved      PT LONG TERM GOAL #4   Title Patient able climb stairs with a reciprocal gait pattern and use of UE support for safety only.    Baseline met 08/31/20    Time 8    Period Weeks    Status Achieved      PT LONG TERM GOAL #5   Title improved Bil LE strength to 4+/5 to improve body mechanics and transfers.    Time 8    Period Weeks    Status On-going                   Plan - 09/26/20 7591     Clinical Impression Statement Patient progressed into more functional training for core and LE strength. Improved balance with lunges noted today. More technique training provided for sit <> stands and squats with short stop position  and weakness noted with no UE support. Limited reps due to LE weakness allowed.    Personal Factors and Comorbidities Comorbidity 3+;Time since onset of injury/illness/exacerbation    Comorbidities compression fx L4/5 and bulging disc, OA, spinal stenosis, OP, DM, kyphoplasty L1-L3/4 2018, HTN    Examination-Activity Limitations Bend;Carry;Dressing;Locomotion Level;Stairs;Stand    Examination-Participation  Restrictions [employer]    Stability/Clinical Decision Making Stable/Uncomplicated    Rehab Potential Good    PT Frequency 2x / week    PT Duration 8 weeks    PT Treatment/Interventions ADLs/Self Care Home Management;Aquatic Therapy;Cryotherapy;Electrical Stimulation;Moist Heat;Stair training;Functional mobility training;Therapeutic activities;Therapeutic exercise;Neuromuscular re-education;Balance training;Patient/family education;Manual techniques;Dry needling    PT Next Visit Plan functional LE strengthening, quadruped core stabilzation; lifting,push/pulling .    PT Home Exercise Plan [id number] (prone lying, bridging)    Consulted and Agree with Plan of Care Patient             Patient will benefit from skilled therapeutic intervention in order to improve the following deficits and impairments:  Pain, Improper body mechanics, Increased muscle spasms, Decreased activity tolerance, Decreased range of motion, Decreased strength, Impaired flexibility, Decreased balance, Impaired sensation  Visit Diagnosis: Radiculopathy, lumbar region  Muscle weakness (generalized)  Unsteadiness on feet     Problem List Patient Active Problem List   Diagnosis Date Noted   Closed compression fracture of L2 lumbar vertebra 10/31/2016   Hyperlipidemia 09/23/2012   Hypothyroid 09/23/2012   Allergic rhinitis 09/23/2012   History of migraine headaches 09/23/2012   IDDM (insulin dependent diabetes mellitus) 06/26/2012   HTN (hypertension) 06/26/2012    Standley Brooking, PTA 09/26/2020, 8:57 AM  [facility] [place], [place], [address] Phone: [phone]   Fax:  [phone]  Name: Kayla Berger MRN: [id number] Date of Birth: 1960-10-17

## 2020-10-04 ENCOUNTER — Encounter: Payer: Self-pay | Admitting: Physical Therapy

## 2020-10-04 ENCOUNTER — Ambulatory Visit: Payer: 59 | Attending: Neurosurgery | Admitting: Physical Therapy

## 2020-10-04 ENCOUNTER — Other Ambulatory Visit: Payer: Self-pay

## 2020-10-04 DIAGNOSIS — M6281 Muscle weakness (generalized): Secondary | ICD-10-CM | POA: Insufficient documentation

## 2020-10-04 DIAGNOSIS — M5416 Radiculopathy, lumbar region: Secondary | ICD-10-CM | POA: Insufficient documentation

## 2020-10-04 DIAGNOSIS — R2681 Unsteadiness on feet: Secondary | ICD-10-CM | POA: Insufficient documentation

## 2020-10-04 NOTE — Therapy (Signed)
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                                PT Short Term Goals - 07/21/20 0740       PT SHORT TERM GOAL #1   Title Assess 5x sit to stand  and TUG and set appropriate LTG    Baseline Normal per 07/12/20    Time 2    Period Weeks    Status Achieved               PT Long Term Goals - 10/04/20 0849       PT LONG TERM GOAL #1   Title ind in HEP and progression and able to verbalize and/or demonstrate correct body mechanics and ADL modifications to prevent further injury.    Baseline issued today 07/21/20    Time 8    Period Weeks    Status Achieved      PT LONG TERM GOAL #2   Title Pt to report decreased pain in the low  back and LEs by >= 50% with ADLS.    Baseline met today 50% improved 08/31/20    Time 8    Period Weeks    Status Achieved      PT LONG TERM GOAL #3   Title Patient able to tolerate a walking program at least 3x/wk without increase in sx.    Baseline increased 4 x week with no increased symptoms as long as she performs upright posture 08/31/20    Time 8    Period Weeks    Status Achieved      PT LONG TERM GOAL #4   Title Patient able climb stairs with a reciprocal gait pattern and use of UE support for safety only.    Baseline met 08/31/20    Time 8    Period Weeks    Status Achieved      PT LONG TERM GOAL #5   Title improved Bil LE strength to 4+/5 to improve  body mechanics and transfers.    Time 8    Period Weeks    Status Achieved                     Patient will benefit from skilled therapeutic intervention in order to improve the following deficits and impairments:  Pain, Improper body mechanics, Increased muscle spasms, Decreased activity tolerance, Decreased range of motion, Decreased strength, Impaired flexibility, Decreased balance, Impaired sensation  Visit Diagnosis: Radiculopathy, lumbar region  Muscle weakness (generalized)  Unsteadiness on feet     Problem List Patient Active Problem List   Diagnosis Date Noted   Closed compression fracture of L2 lumbar vertebra 10/31/2016   Hyperlipidemia 09/23/2012   Hypothyroid 09/23/2012   Allergic rhinitis 09/23/2012   History of migraine headaches 09/23/2012   IDDM (insulin dependent diabetes mellitus) 06/26/2012   HTN (hypertension) 06/26/2012   Standley Brooking, PTA 10/11/20 2:14 PM    [facility] [address] [place], [place], [address] Phone: [phone]   Fax:  [phone]  Name: Kayla Berger MRN: [id number] Date of Birth: 02-May-1960   PHYSICAL THERAPY DISCHARGE SUMMARY  Visits from Start of Care: 20.  Current functional level related to  goals / functional outcomes: See above.   Remaining deficits: All goals met.   Education / Equipment: HEP.   Patient agrees to discharge. Patient goals were met. Patient is being discharged due to meeting the stated rehab goals.    Mali Applegate MPT

## 2021-01-23 ENCOUNTER — Ambulatory Visit: Payer: Self-pay | Admitting: Surgery

## 2021-01-23 NOTE — H&P (View-Only) (Signed)
History of Present Illness: Kayla Berger is a 60 y.o. female who was referred to me for evaluation of gallstones. In 2014 she began having episodes of RUQ abdominal pain, and at that time had a RUQ [place] that did not show cholelithiasis. She then had a HIDA, showed a reduced gallbladder EF of 18%.  She said shortly after this her symptoms resolved so she did not pursue surgery at that time.  In November this year, she again began having right upper quadrant abdominal pain which is worse after eating.  It is also associated with diarrhea and reflux symptoms.  The pain gets worse with fatty and greasy foods.  She was evaluated by her PCP and underwent an ultrasound, which she was told showed gallstones, although we do not have the report or imaging available from this study. She has previously had LFTs that were normal. She has been referred to me to discuss cholecystectomy.  She continues to have pain very frequently and has removed all fatty and greasy foods from her diet.   Her only prior abdominal surgery is an open appendectomy in childhood at the age of 32.  She takes a baby aspirin daily but no other antiplatelets or anticoagulants.     Review of Systems: A complete review of systems was obtained from the patient.  I have reviewed this information and discussed as appropriate with the patient.  See HPI as well for other ROS.       Medical History: Past Medical History Past Medical History: Diagnosis Date  Arthritis    Diabetes mellitus without complication (CMS-HCC)    GERD (gastroesophageal reflux disease)    Hyperlipidemia    Hypertension        Patient Active Problem List Diagnosis  Right upper quadrant abdominal pain  Biliary colic     Past Surgical History Past Surgical History: Procedure Laterality Date  diabetic cataraet   01/2016  KYPHOPLASTY   10/31/2016  APPENDECTOMY          Allergies Allergies Allergen Reactions  Hydrocodone Other (See Comments), Swelling and  Unknown     Swelling,,iitching Numbness and tingling        Current Outpatient Medications on File Prior to Visit Medication Sig Dispense Refill  aspirin 81 MG chewable tablet 1 tablet      atorvastatin (LIPITOR) 10 MG tablet Take 1 tablet by mouth once daily      DULoxetine (CYMBALTA) 60 MG DR capsule Take 1 capsule by mouth 2 (two) times daily      insulin LISPRO (HUMALOG KWIKPEN INSULIN) pen injector (concentration 100 units/mL) 5-10u      teriparatide (FORTEO) 20 mcg/dose (626mcg/2.48mL) pen injector      gabapentin (NEURONTIN) 800 MG tablet TAKE 1 TABLET BY MOUTH AT BEDTIME FOR NEUROPATHY FOR 30 DAYS      lisinopriL-hydrochlorothiazide (ZESTORETIC) 10-12.5 mg tablet Take 1 tablet by mouth once daily      metFORMIN (GLUCOPHAGE) 1000 MG tablet Take by mouth every 12 (twelve) hours      TOUJEO SOLOSTAR U-300 INSULIN pen injector (concentration 300 units/mL)         No current facility-administered medications on file prior to visit.     Family History Family History Problem Relation Age of Onset  Stroke Mother    Hyperlipidemia (Elevated cholesterol) Mother    Coronary Artery Disease (Blocked arteries around heart) Mother    Obesity Father    High blood pressure (Hypertension) Father    Hyperlipidemia (Elevated cholesterol) Father  Diabetes Father    Skin cancer Sister    Obesity Sister    High blood pressure (Hypertension) Sister    Diabetes Sister        Social History   Tobacco Use Smoking Status Never Smokeless Tobacco Never     Social History Social History    Socioeconomic History  Marital status: Divorced Tobacco Use  Smoking status: Never  Smokeless tobacco: Never Substance and Sexual Activity  Alcohol use: Never  Drug use: Never      Objective:     Vitals:   01/23/21 0926 BP: 120/78 Pulse: (!) 124 Temp: 36.7 C (98 F) SpO2: 99% Weight: 86.6 kg (191 lb) Height: 162.6 cm (5\' 4" )   Body mass index is 32.79 kg/m.   Physical  Exam Vitals reviewed.  Constitutional:      General: She is not in acute distress.    Appearance: Normal appearance.  HENT:     Head: Normocephalic and atraumatic.  Eyes:     General: No scleral icterus.    Conjunctiva/sclera: Conjunctivae normal.  Cardiovascular:     Rate and Rhythm: Regular rhythm.     Comments: Tachycardic 110s, regular rhythm (patient reports her baseline HR is usually in the 100s). Pulmonary:     Effort: Pulmonary effort is normal. No respiratory distress.     Breath sounds: Normal breath sounds. No wheezing.  Abdominal:     Palpations: Abdomen is soft.     Comments: Focally tender to palpation in the RUQ. No masses or organomegaly.  Musculoskeletal:        General: No swelling or deformity. Normal range of motion.     Cervical back: Normal range of motion.  Skin:    General: Skin is warm and dry.     Coloration: Skin is not jaundiced.  Neurological:     General: No focal deficit present.     Mental Status: She is alert.  Psychiatric:        Mood and Affect: Mood normal.        Behavior: Behavior normal.        Thought Content: Thought content normal.            Assessment and Plan: Diagnoses and all orders for this visit:   Right upper quadrant abdominal pain   Biliary colic       This is a 60 year old female referred with recurrent right upper quadrant abdominal pain.  Her symptoms are very consistent with biliary colic.  Her referral notes document cholelithiasis, however we do not have her most recent imaging.  I reviewed her prior ultrasound and HIDA scan from 2014.  The ultrasound shows normal appearance of the gallbladder with no stones, and the biliary tree was patent on HIDA.  We will request her most recent ultrasound images to confirm the diagnosis of cholelithiasis. Laparoscopic cholecystectomy was recommended. The details of this procedure were discussed with the patient, including the risks of bleeding, infection, bile leak, and <0.5%  risk of common bile duct injury. The patient expressed understanding and agrees to proceed with surgery. She will be contacted to schedule a surgery date. She was provided with an education pamphlet on gallbladder surgery.   2015, MD [facility] Surgery General, Hepatobiliary and Pancreatic Surgery 01/23/21 10:24 AM

## 2021-01-23 NOTE — H&P (Signed)
History of Present Illness: Kayla Berger is a 60 y.o. female who was referred to me for evaluation of gallstones. In 2014 she began having episodes of RUQ abdominal pain, and at that time had a RUQ [place] that did not show cholelithiasis. She then had a HIDA, showed a reduced gallbladder EF of 18%.  She said shortly after this her symptoms resolved so she did not pursue surgery at that time.  In November this year, she again began having right upper quadrant abdominal pain which is worse after eating.  It is also associated with diarrhea and reflux symptoms.  The pain gets worse with fatty and greasy foods.  She was evaluated by her PCP and underwent an ultrasound, which she was told showed gallstones, although we do not have the report or imaging available from this study. She has previously had LFTs that were normal. She has been referred to me to discuss cholecystectomy.  She continues to have pain very frequently and has removed all fatty and greasy foods from her diet.   Her only prior abdominal surgery is an open appendectomy in childhood at the age of 66.  She takes a baby aspirin daily but no other antiplatelets or anticoagulants.     Review of Systems: A complete review of systems was obtained from the patient.  I have reviewed this information and discussed as appropriate with the patient.  See HPI as well for other ROS.       Medical History: Past Medical History Past Medical History: Diagnosis Date  Arthritis    Diabetes mellitus without complication (CMS-HCC)    GERD (gastroesophageal reflux disease)    Hyperlipidemia    Hypertension        Patient Active Problem List Diagnosis  Right upper quadrant abdominal pain  Biliary colic     Past Surgical History Past Surgical History: Procedure Laterality Date  diabetic cataraet   01/2016  KYPHOPLASTY   10/31/2016  APPENDECTOMY          Allergies Allergies Allergen Reactions  Hydrocodone Other (See Comments), Swelling and  Unknown     Swelling,,iitching Numbness and tingling        Current Outpatient Medications on File Prior to Visit Medication Sig Dispense Refill  aspirin 81 MG chewable tablet 1 tablet      atorvastatin (LIPITOR) 10 MG tablet Take 1 tablet by mouth once daily      DULoxetine (CYMBALTA) 60 MG DR capsule Take 1 capsule by mouth 2 (two) times daily      insulin LISPRO (HUMALOG KWIKPEN INSULIN) pen injector (concentration 100 units/mL) 5-10u      teriparatide (FORTEO) 20 mcg/dose (632mcg/2.48mL) pen injector      gabapentin (NEURONTIN) 800 MG tablet TAKE 1 TABLET BY MOUTH AT BEDTIME FOR NEUROPATHY FOR 30 DAYS      lisinopriL-hydrochlorothiazide (ZESTORETIC) 10-12.5 mg tablet Take 1 tablet by mouth once daily      metFORMIN (GLUCOPHAGE) 1000 MG tablet Take by mouth every 12 (twelve) hours      TOUJEO SOLOSTAR U-300 INSULIN pen injector (concentration 300 units/mL)         No current facility-administered medications on file prior to visit.     Family History Family History Problem Relation Age of Onset  Stroke Mother    Hyperlipidemia (Elevated cholesterol) Mother    Coronary Artery Disease (Blocked arteries around heart) Mother    Obesity Father    High blood pressure (Hypertension) Father    Hyperlipidemia (Elevated cholesterol) Father  Diabetes Father    Skin cancer Sister    Obesity Sister    High blood pressure (Hypertension) Sister    Diabetes Sister        Social History   Tobacco Use Smoking Status Never Smokeless Tobacco Never     Social History Social History    Socioeconomic History  Marital status: Divorced Tobacco Use  Smoking status: Never  Smokeless tobacco: Never Substance and Sexual Activity  Alcohol use: Never  Drug use: Never      Objective:     Vitals:   01/23/21 0926 BP: 120/78 Pulse: (!) 124 Temp: 36.7 C (98 F) SpO2: 99% Weight: 86.6 kg (191 lb) Height: 162.6 cm (5\' 4" )   Body mass index is 32.79 kg/m.   Physical  Exam Vitals reviewed.  Constitutional:      General: She is not in acute distress.    Appearance: Normal appearance.  HENT:     Head: Normocephalic and atraumatic.  Eyes:     General: No scleral icterus.    Conjunctiva/sclera: Conjunctivae normal.  Cardiovascular:     Rate and Rhythm: Regular rhythm.     Comments: Tachycardic 110s, regular rhythm (patient reports her baseline HR is usually in the 100s). Pulmonary:     Effort: Pulmonary effort is normal. No respiratory distress.     Breath sounds: Normal breath sounds. No wheezing.  Abdominal:     Palpations: Abdomen is soft.     Comments: Focally tender to palpation in the RUQ. No masses or organomegaly.  Musculoskeletal:        General: No swelling or deformity. Normal range of motion.     Cervical back: Normal range of motion.  Skin:    General: Skin is warm and dry.     Coloration: Skin is not jaundiced.  Neurological:     General: No focal deficit present.     Mental Status: She is alert.  Psychiatric:        Mood and Affect: Mood normal.        Behavior: Behavior normal.        Thought Content: Thought content normal.            Assessment and Plan: Diagnoses and all orders for this visit:   Right upper quadrant abdominal pain   Biliary colic       This is a 60 year old female referred with recurrent right upper quadrant abdominal pain.  Her symptoms are very consistent with biliary colic.  Her referral notes document cholelithiasis, however we do not have her most recent imaging.  I reviewed her prior ultrasound and HIDA scan from 2014.  The ultrasound shows normal appearance of the gallbladder with no stones, and the biliary tree was patent on HIDA.  We will request her most recent ultrasound images to confirm the diagnosis of cholelithiasis. Laparoscopic cholecystectomy was recommended. The details of this procedure were discussed with the patient, including the risks of bleeding, infection, bile leak, and <0.5%  risk of common bile duct injury. The patient expressed understanding and agrees to proceed with surgery. She will be contacted to schedule a surgery date. She was provided with an education pamphlet on gallbladder surgery.   2015, MD [facility] Surgery General, Hepatobiliary and Pancreatic Surgery 01/23/21 10:24 AM

## 2021-02-12 NOTE — Progress Notes (Signed)
Surgical Instructions    Your procedure is scheduled on 02/16/21.  Report to [facility] Main Entrance "A" at 5:30 A.M., then check in with the Admitting office.  Call this number if you have problems the morning of surgery:  [phone]   If you have any questions prior to your surgery date call [phone]: Open Monday-Friday 8am-4pm    Remember:  Do not eat or drink after midnight the night before your surgery     Take these medicines the morning of surgery with A SIP OF WATER: cetirizine (ZYRTEC)  DULoxetine (CYMBALTA)    As of today, STOP taking any Aspirin (unless otherwise instructed by your surgeon) Aleve, Naproxen, Ibuprofen, Motrin, Advil, Goody's, BC's, all herbal medications, fish oil, and all vitamins.  WHAT DO I DO ABOUT MY DIABETES MEDICATION?   Do not take oral diabetes medicines (pills) the morning of surgery.  THE NIGHT BEFORE SURGERY, take __24__(50%) units of ____Toujeo_______insulin.   DO NOT take Humalog Insulin day of surgery.       The day of surgery, do not take other diabetes injectables, including Byetta (exenatide), Bydureon (exenatide ER), Victoza (liraglutide), or Trulicity (dulaglutide).  If your CBG is greater than 220 mg/dL, you may take  of your sliding scale (correction) dose of insulin.   HOW TO MANAGE YOUR DIABETES BEFORE AND AFTER SURGERY  Why is it important to control my blood sugar before and after surgery? Improving blood sugar levels before and after surgery helps healing and can limit problems. A way of improving blood sugar control is eating a healthy diet by:  Eating less sugar and carbohydrates  Increasing activity/exercise  Talking with your doctor about reaching your blood sugar goals High blood sugars (greater than 180 mg/dL) can raise your risk of infections and slow your recovery, so you will need to focus on controlling your diabetes during the weeks before surgery. Make sure that the doctor who takes care of your  diabetes knows about your planned surgery including the date and location.  How do I manage my blood sugar before surgery? Check your blood sugar at least 4 times a day, starting 2 days before surgery, to make sure that the level is not too high or low.  Check your blood sugar the morning of your surgery when you wake up and every 2 hours until you get to the Short Stay unit.  If your blood sugar is less than 70 mg/dL, you will need to treat for low blood sugar: Do not take insulin. Treat a low blood sugar (less than 70 mg/dL) with  cup of clear juice (cranberry or apple), 4 glucose tablets, OR glucose gel. Recheck blood sugar in 15 minutes after treatment (to make sure it is greater than 70 mg/dL). If your blood sugar is not greater than 70 mg/dL on recheck, call [phone] for further instructions. Report your blood sugar to the short stay nurse when you get to Short Stay.  If you are admitted to the hospital after surgery: Your blood sugar will be checked by the staff and you will probably be given insulin after surgery (instead of oral diabetes medicines) to make sure you have good blood sugar levels. The goal for blood sugar control after surgery is 80-180 mg/dL.    After your COVID test   You are not required to quarantine however you are required to wear a well-fitting mask when you are out and around people not in your household.  If your mask becomes wet or soiled,  replace with a new one.  Wash your hands often with soap and water for 20 seconds or clean your hands with an alcohol-based hand sanitizer that contains at least 60% alcohol.  Do not share personal items.  Notify your provider: if you are in close contact with someone who has COVID  or if you develop a fever of 100.4 or greater, sneezing, cough, sore throat, shortness of breath or body aches.             Do not wear jewelry or makeup Do not wear lotions, powders, perfumes or deodorant. Do not shave 48 hours  prior to surgery.   Do not bring valuables to the hospital. DO Not wear nail polish, gel polish, artificial nails, or any other type of covering on natural nails including finger and toenails. If patients have artificial nails, gel coating, etc. that need to be removed by a nail salon, please have this removed prior to surgery or surgery may need to be canceled/delayed if the surgeon/ anesthesia feels like the patient is unable to be adequately monitored.             [facility] is not responsible for any belongings or valuables.  Do NOT Smoke (Tobacco/Vaping)  24 hours prior to your procedure  If you use a CPAP at night, you may bring your mask for your overnight stay.   Contacts, glasses, hearing aids, dentures or partials may not be worn into surgery, please bring cases for these belongings   For patients admitted to the hospital, discharge time will be determined by your treatment team.   Patients discharged the day of surgery will not be allowed to drive home, and someone needs to stay with them for 24 hours.  NO VISITORS WILL BE ALLOWED IN PRE-OP WHERE PATIENTS ARE PREPPED FOR SURGERY.  ONLY 1 SUPPORT PERSON MAY BE PRESENT IN THE WAITING ROOM WHILE YOU ARE IN SURGERY.  IF YOU ARE TO BE ADMITTED, ONCE YOU ARE IN YOUR ROOM YOU WILL BE ALLOWED TWO (2) VISITORS. 1 (ONE) VISITOR MAY STAY OVERNIGHT BUT MUST ARRIVE TO THE ROOM BY 8pm.  Minor children may have two parents present. Special consideration for safety and communication needs will be reviewed on a case by case basis.  Special instructions:    Oral Hygiene is also important to reduce your risk of infection.  Remember - BRUSH YOUR TEETH THE MORNING OF SURGERY WITH YOUR REGULAR TOOTHPASTE   [place]- Preparing For Surgery  Before surgery, you can play an important role. Because skin is not sterile, your skin needs to be as free of germs as possible. You can reduce the number of germs on your skin by washing with CHG (chlorahexidine  gluconate) Soap before surgery.  CHG is an antiseptic cleaner which kills germs and bonds with the skin to continue killing germs even after washing.     Please do not use if you have an allergy to CHG or antibacterial soaps. If your skin becomes reddened/irritated stop using the CHG.  Do not shave (including legs and underarms) for at least 48 hours prior to first CHG shower. It is OK to shave your face.  Please follow these instructions carefully.     Shower the NIGHT BEFORE SURGERY and the MORNING OF SURGERY with CHG Soap.   If you chose to wash your hair, wash your hair first as usual with your normal shampoo. After you shampoo, rinse your hair and body thoroughly to remove the shampoo.  Then [employer] and genitals (private parts) with your normal soap and rinse thoroughly to remove soap.  After that Use CHG Soap as you would any other liquid soap. You can apply CHG directly to the skin and wash gently with a scrungie or a clean washcloth.   Apply the CHG Soap to your body ONLY FROM THE NECK DOWN.  Do not use on open wounds or open sores. Avoid contact with your eyes, ears, mouth and genitals (private parts). Wash Face and genitals (private parts)  with your normal soap.   Wash thoroughly, paying special attention to the area where your surgery will be performed.  Thoroughly rinse your body with warm water from the neck down.  DO NOT shower/wash with your normal soap after using and rinsing off the CHG Soap.  Pat yourself dry with a CLEAN TOWEL.  Wear CLEAN PAJAMAS to bed the night before surgery  Place CLEAN SHEETS on your bed the night before your surgery  DO NOT SLEEP WITH PETS.   Day of Surgery:  Take a shower with CHG soap. Wear Clean/Comfortable clothing the morning of surgery Do not apply any deodorants/lotions.   Remember to brush your teeth WITH YOUR REGULAR TOOTHPASTE.   Please read over the following fact sheets that you were given.

## 2021-02-13 ENCOUNTER — Other Ambulatory Visit: Payer: Self-pay

## 2021-02-13 ENCOUNTER — Encounter (HOSPITAL_COMMUNITY): Payer: Self-pay

## 2021-02-13 ENCOUNTER — Encounter (HOSPITAL_COMMUNITY)
Admission: RE | Admit: 2021-02-13 | Discharge: 2021-02-13 | Disposition: A | Discharge status: Home / Self Care | Payer: 59 | Source: Ambulatory Visit | Attending: Surgery | Admitting: Surgery

## 2021-02-13 VITALS — BP 132/86 | HR 125 | Temp 97.8°F | Resp 20 | Ht 64.0 in | Wt 193.9 lb

## 2021-02-13 DIAGNOSIS — R000 Tachycardia, unspecified: Secondary | ICD-10-CM | POA: Insufficient documentation

## 2021-02-13 DIAGNOSIS — E119 Type 2 diabetes mellitus without complications: Secondary | ICD-10-CM | POA: Insufficient documentation

## 2021-02-13 DIAGNOSIS — Z01818 Encounter for other preprocedural examination: Secondary | ICD-10-CM | POA: Diagnosis present

## 2021-02-13 DIAGNOSIS — Z794 Long term (current) use of insulin: Secondary | ICD-10-CM | POA: Diagnosis not present

## 2021-02-13 DIAGNOSIS — R1011 Right upper quadrant pain: Secondary | ICD-10-CM | POA: Diagnosis not present

## 2021-02-13 DIAGNOSIS — K8020 Calculus of gallbladder without cholecystitis without obstruction: Secondary | ICD-10-CM | POA: Diagnosis not present

## 2021-02-13 HISTORY — DX: Nausea with vomiting, unspecified: R11.2

## 2021-02-13 HISTORY — DX: Gastro-esophageal reflux disease without esophagitis: K21.9

## 2021-02-13 HISTORY — DX: Other specified postprocedural states: Z98.890

## 2021-02-13 LAB — CBC
HCT: 41.5 % (ref 36.0–46.0)
Hemoglobin: 13.7 g/dL (ref 12.0–15.0)
MCH: 30.3 pg (ref 26.0–34.0)
MCHC: 33 g/dL (ref 30.0–36.0)
MCV: 91.8 fL (ref 80.0–100.0)
Platelets: 296 10*3/uL (ref 150–400)
RBC: 4.52 MIL/uL (ref 3.87–5.11)
RDW: 13.2 % (ref 11.5–15.5)
WBC: 11.9 10*3/uL — ABNORMAL HIGH (ref 4.0–10.5)
nRBC: 0 % (ref 0.0–0.2)

## 2021-02-13 LAB — GLUCOSE, CAPILLARY: Glucose-Capillary: 103 mg/dL — ABNORMAL HIGH (ref 70–99)

## 2021-02-13 LAB — HEMOGLOBIN A1C
Hgb A1c MFr Bld: 7.1 % — ABNORMAL HIGH (ref 4.8–5.6)
Mean Plasma Glucose: 157.07 mg/dL

## 2021-02-13 LAB — BASIC METABOLIC PANEL
Anion gap: 12 (ref 5–15)
BUN: 14 mg/dL (ref 6–20)
CO2: 24 mmol/L (ref 22–32)
Calcium: 9.2 mg/dL (ref 8.9–10.3)
Chloride: 105 mmol/L (ref 98–111)
Creatinine, Ser: 0.74 mg/dL (ref 0.44–1.00)
GFR, Estimated: >60 mL/min (ref >60)
Glucose, Bld: 101 mg/dL — ABNORMAL HIGH (ref 70–99)
Potassium: 4 mmol/L (ref 3.5–5.1)
Sodium: 141 mmol/L (ref 135–145)

## 2021-02-13 NOTE — Progress Notes (Addendum)
PCP: Janie Morning, DO Cardiologist: Soyla Dryer, [facility]  EKG: 02/13/21 CXR: 11/08/09 ECHO: denies Stress Test: denies Cardiac Cath: denies  Fasting Blood Sugar- 100-150 fasting.  175-195 two hours after eating Checks Blood Sugar: Dexcom Sensor Meter.  Continuous monitoring.   ASA: will call surgeon's office for instructions Blood Thinner: No  OSA/CPAP: No  Covid test not needed - ambulatory  Anesthesia Review: Yes, dysrhythmia history, sinus tach.  Reviewed with Jeneen Rinks, [place].  Last office note requested from PCP  Patient denies shortness of breath, fever, cough, and chest pain at PAT appointment.  Patient verbalized understanding of instructions provided today at the PAT appointment.  Patient asked to review instructions at home and day of surgery.

## 2021-02-13 NOTE — Progress Notes (Signed)
Anesthesia Chart Review:  Patient with baseline resting tachycardia.  She reports the past several years her resting heart rate has been >100 bpm.  Review of available records in epic does confirm this.  Heart rate 124 bpm 01/24/2020 seen by Dr. Freida Busman, 140 bpm 05/17/2020 when seen at [facility] neurosurgery.  Prior to 2018, her resting heart rate appears normal, 70 to 90 bpm.  However, since that time she is consistently been 100-140 bpm at rest.  Her heart rate on arrival to preadmission testing was 125. EKG showed sinus tachycardia at a rate of 108.  She denies any cardiac symptoms.  Denies any history of cardiac disease.  Denies any shortness of breath or chest pain.  She does have a history of significantly uncontrolled insulin-dependent DM2 with A1c 12+ for multiple years per available records in epic.  Recently under much better control, preop A1c 7.1.  Given history of markedly uncontrolled blood sugar, consider autonomic diabetic neuropathy contributing to resting tachycardia.  Last PCP note from Dr. Irena Reichmann 11/30/2020 reviewed.  Heart rate 100 at that time.  She was noted to have calculus of gallbladder with right upper quadrant pain and was referred to general surgery for consideration of cholecystectomy.  Preop labs reviewed, unremarkable.  EKG 02/13/2021: Sinus tachycardia.  Rate 108. Low voltage QRS. Cannot rule out Anterior infarct , age undetermined.    Zannie Cove [facility] Short Stay Center/Anesthesiology Phone [phone] 02/14/2021 10:18 AM

## 2021-02-14 NOTE — Anesthesia Preprocedure Evaluation (Addendum)
Anesthesia Evaluation  Patient identified by MRN, date of birth, ID band Patient awake    Reviewed: Allergy & Precautions, NPO status , Patient's Chart, lab work & pertinent test results  History of Anesthesia Complications (+) PONV and history of anesthetic complications  Airway Mallampati: II  TM Distance: >3 FB Neck ROM: Full    Dental  (+) Dental Advisory Given, Chipped   Pulmonary neg pulmonary ROS,    Pulmonary exam normal        Cardiovascular hypertension, Normal cardiovascular exam+ dysrhythmias    Resting tachycardia - see PAT note   Neuro/Psych  Headaches, negative psych ROS   GI/Hepatic Neg liver ROS, GERD  Controlled,  Endo/Other  diabetes, Type 2, Insulin Dependent, Oral Hypoglycemic AgentsHypothyroidism  Obesity   Renal/GU negative Renal ROS     Musculoskeletal  (+) Arthritis ,   Abdominal   Peds  Hematology negative hematology ROS (+)   Anesthesia Other Findings   Reproductive/Obstetrics  PCOS                            Anesthesia Physical Anesthesia Plan  ASA: 2  Anesthesia Plan: General   Post-op Pain Management: Tylenol PO (pre-op) and Celebrex PO (pre-op)   Induction: Intravenous  PONV Risk Score and Plan: 4 or greater and Treatment may vary due to age or medical condition, Ondansetron, Dexamethasone, Midazolam, Aprepitant and Propofol infusion  Airway Management Planned: Oral ETT  Additional Equipment: None  Intra-op Plan:   Post-operative Plan: Extubation in OR  Informed Consent: I have reviewed the patients History and Physical, chart, labs and discussed the procedure including the risks, benefits and alternatives for the proposed anesthesia with the patient or authorized representative who has indicated his/her understanding and acceptance.     Dental advisory given  Plan Discussed with: CRNA and Anesthesiologist  Anesthesia Plan Comments:       Anesthesia Quick Evaluation

## 2021-02-16 ENCOUNTER — Ambulatory Visit (HOSPITAL_COMMUNITY): Payer: 59 | Admitting: Physician Assistant

## 2021-02-16 ENCOUNTER — Encounter (HOSPITAL_COMMUNITY): Payer: Self-pay | Admitting: Surgery

## 2021-02-16 ENCOUNTER — Ambulatory Visit (HOSPITAL_COMMUNITY): Payer: 59 | Admitting: Anesthesiology

## 2021-02-16 ENCOUNTER — Encounter (HOSPITAL_COMMUNITY)
Admission: RE | Disposition: A | Discharge status: Home / Self Care | Payer: Self-pay | Source: Home / Self Care | Attending: Surgery

## 2021-02-16 ENCOUNTER — Ambulatory Visit (HOSPITAL_COMMUNITY)
Admission: RE | Admit: 2021-02-16 | Discharge: 2021-02-16 | Disposition: A | Discharge status: Home / Self Care | Payer: 59 | Attending: Surgery | Admitting: Surgery

## 2021-02-16 DIAGNOSIS — Z6832 Body mass index (BMI) 32.0-32.9, adult: Secondary | ICD-10-CM | POA: Diagnosis not present

## 2021-02-16 DIAGNOSIS — K8010 Calculus of gallbladder with chronic cholecystitis without obstruction: Secondary | ICD-10-CM | POA: Insufficient documentation

## 2021-02-16 DIAGNOSIS — I10 Essential (primary) hypertension: Secondary | ICD-10-CM | POA: Insufficient documentation

## 2021-02-16 DIAGNOSIS — E119 Type 2 diabetes mellitus without complications: Secondary | ICD-10-CM | POA: Diagnosis not present

## 2021-02-16 DIAGNOSIS — E669 Obesity, unspecified: Secondary | ICD-10-CM | POA: Diagnosis not present

## 2021-02-16 DIAGNOSIS — Z7982 Long term (current) use of aspirin: Secondary | ICD-10-CM | POA: Diagnosis not present

## 2021-02-16 DIAGNOSIS — Z9049 Acquired absence of other specified parts of digestive tract: Secondary | ICD-10-CM | POA: Diagnosis not present

## 2021-02-16 DIAGNOSIS — Z7984 Long term (current) use of oral hypoglycemic drugs: Secondary | ICD-10-CM | POA: Insufficient documentation

## 2021-02-16 HISTORY — PX: CHOLECYSTECTOMY: SHX55

## 2021-02-16 LAB — GLUCOSE, CAPILLARY
Glucose-Capillary: 134 mg/dL — ABNORMAL HIGH (ref 70–99)
Glucose-Capillary: 166 mg/dL — ABNORMAL HIGH (ref 70–99)

## 2021-02-16 SURGERY — LAPAROSCOPIC CHOLECYSTECTOMY
Anesthesia: General

## 2021-02-16 MED ORDER — PHENYLEPHRINE HCL-NACL 20-0.9 MG/250ML-% IV SOLN
INTRAVENOUS | Status: DC | PRN
  Administered 2021-02-16: 20 ug/min via INTRAVENOUS

## 2021-02-16 MED ORDER — LIDOCAINE 2% (20 MG/ML) 5 ML SYRINGE
INTRAMUSCULAR | Status: DC | PRN
Start: 2021-02-16 — End: 2021-02-16
  Administered 2021-02-16: 60 mg via INTRAVENOUS

## 2021-02-16 MED ORDER — LACTATED RINGERS IV SOLN: INTRAVENOUS | Status: DC

## 2021-02-16 MED ORDER — CEFAZOLIN SODIUM-DEXTROSE 2-4 GM/100ML-% IV SOLN
2.0000 g | INTRAVENOUS | Status: AC
  Administered 2021-02-16: 2 g via INTRAVENOUS

## 2021-02-16 MED ORDER — CELECOXIB 200 MG PO CAPS: 200.0000 mg | ORAL_CAPSULE | Freq: Once | ORAL | Status: AC

## 2021-02-16 MED ORDER — MIDAZOLAM HCL 2 MG/2ML IJ SOLN
INTRAMUSCULAR | Status: DC | PRN
  Administered 2021-02-16: 2 mg via INTRAVENOUS

## 2021-02-16 MED ORDER — BUPIVACAINE-EPINEPHRINE (PF) 0.25% -1:200000 IJ SOLN
INTRAMUSCULAR | Status: AC
  Filled 2021-02-16: qty 30

## 2021-02-16 MED ORDER — CHLORHEXIDINE GLUCONATE 0.12 % MT SOLN
OROMUCOSAL | Status: AC
  Administered 2021-02-16: 15 mL via OROMUCOSAL
  Filled 2021-02-16: qty 15

## 2021-02-16 MED ORDER — ONDANSETRON HCL 4 MG/2ML IJ SOLN
INTRAMUSCULAR | Status: DC | PRN
  Administered 2021-02-16: 4 mg via INTRAVENOUS

## 2021-02-16 MED ORDER — OXYCODONE HCL 5 MG PO TABS: 5.0000 mg | ORAL_TABLET | Freq: Once | ORAL | Status: DC | PRN

## 2021-02-16 MED ORDER — LIDOCAINE 2% (20 MG/ML) 5 ML SYRINGE
INTRAMUSCULAR | Status: AC
  Filled 2021-02-16: qty 5

## 2021-02-16 MED ORDER — OXYCODONE HCL 5 MG/5ML PO SOLN: 5.0000 mg | Freq: Once | ORAL | Status: DC | PRN

## 2021-02-16 MED ORDER — ROCURONIUM BROMIDE 10 MG/ML (PF) SYRINGE
PREFILLED_SYRINGE | INTRAVENOUS | Status: DC | PRN
  Administered 2021-02-16: 30 mg via INTRAVENOUS
  Administered 2021-02-16: 50 mg via INTRAVENOUS

## 2021-02-16 MED ORDER — ACETAMINOPHEN 500 MG PO TABS
ORAL_TABLET | ORAL | Status: AC
  Administered 2021-02-16: 1000 mg via ORAL
  Filled 2021-02-16: qty 2

## 2021-02-16 MED ORDER — DEXAMETHASONE SODIUM PHOSPHATE 10 MG/ML IJ SOLN
INTRAMUSCULAR | Status: DC | PRN
  Administered 2021-02-16: 5 mg via INTRAVENOUS

## 2021-02-16 MED ORDER — APREPITANT 40 MG PO CAPS
ORAL_CAPSULE | ORAL | Status: AC
  Administered 2021-02-16: 40 mg via ORAL
  Filled 2021-02-16: qty 1

## 2021-02-16 MED ORDER — FENTANYL CITRATE (PF) 250 MCG/5ML IJ SOLN
INTRAMUSCULAR | Status: DC | PRN
  Administered 2021-02-16: 50 ug via INTRAVENOUS
  Administered 2021-02-16: 100 ug via INTRAVENOUS
  Administered 2021-02-16: 50 ug via INTRAVENOUS

## 2021-02-16 MED ORDER — DEXMEDETOMIDINE (PRECEDEX) IN NS 20 MCG/5ML (4 MCG/ML) IV SYRINGE
PREFILLED_SYRINGE | INTRAVENOUS | Status: AC
  Filled 2021-02-16: qty 5

## 2021-02-16 MED ORDER — ONDANSETRON HCL 4 MG PO TABS: 4.0000 mg | ORAL_TABLET | Freq: Four times a day (QID) | ORAL | 0 refills | Status: DC | PRN

## 2021-02-16 MED ORDER — TRAMADOL HCL 50 MG PO TABS: 50.0000 mg | ORAL_TABLET | Freq: Four times a day (QID) | ORAL | 0 refills | Status: AC | PRN

## 2021-02-16 MED ORDER — FENTANYL CITRATE (PF) 250 MCG/5ML IJ SOLN
INTRAMUSCULAR | Status: AC
  Filled 2021-02-16: qty 5

## 2021-02-16 MED ORDER — PROPOFOL 10 MG/ML IV BOLUS
INTRAVENOUS | Status: DC | PRN
  Administered 2021-02-16: 150 mg via INTRAVENOUS

## 2021-02-16 MED ORDER — FENTANYL CITRATE (PF) 100 MCG/2ML IJ SOLN: 25.0000 ug | INTRAMUSCULAR | Status: DC | PRN

## 2021-02-16 MED ORDER — APREPITANT 40 MG PO CAPS: 40.0000 mg | ORAL_CAPSULE | Freq: Once | ORAL | Status: AC

## 2021-02-16 MED ORDER — ACETAMINOPHEN 500 MG PO TABS: 1000.0000 mg | ORAL_TABLET | Freq: Once | ORAL | Status: AC

## 2021-02-16 MED ORDER — SUGAMMADEX SODIUM 200 MG/2ML IV SOLN
INTRAVENOUS | Status: DC | PRN
  Administered 2021-02-16: 200 mg via INTRAVENOUS

## 2021-02-16 MED ORDER — CELECOXIB 200 MG PO CAPS
ORAL_CAPSULE | ORAL | Status: AC
  Administered 2021-02-16: 200 mg via ORAL
  Filled 2021-02-16: qty 1

## 2021-02-16 MED ORDER — DEXMEDETOMIDINE (PRECEDEX) IN NS 20 MCG/5ML (4 MCG/ML) IV SYRINGE
PREFILLED_SYRINGE | INTRAVENOUS | Status: DC | PRN
Start: 2021-02-16 — End: 2021-02-16
  Administered 2021-02-16: 8 ug via INTRAVENOUS

## 2021-02-16 MED ORDER — PROPOFOL 10 MG/ML IV BOLUS
INTRAVENOUS | Status: AC
  Filled 2021-02-16: qty 20

## 2021-02-16 MED ORDER — PROPOFOL 500 MG/50ML IV EMUL
INTRAVENOUS | Status: DC | PRN
  Administered 2021-02-16: 20 ug/kg/min via INTRAVENOUS

## 2021-02-16 MED ORDER — CEFAZOLIN SODIUM-DEXTROSE 2-4 GM/100ML-% IV SOLN
INTRAVENOUS | Status: AC
  Filled 2021-02-16: qty 100

## 2021-02-16 MED ORDER — ROCURONIUM BROMIDE 10 MG/ML (PF) SYRINGE
PREFILLED_SYRINGE | INTRAVENOUS | Status: AC
  Filled 2021-02-16: qty 10

## 2021-02-16 MED ORDER — MIDAZOLAM HCL 2 MG/2ML IJ SOLN
INTRAMUSCULAR | Status: AC
  Filled 2021-02-16: qty 2

## 2021-02-16 MED ORDER — PROMETHAZINE HCL 25 MG/ML IJ SOLN
6.2500 mg | INTRAMUSCULAR | Status: DC | PRN
  Administered 2021-02-16: 6.25 mg via INTRAVENOUS

## 2021-02-16 MED ORDER — PHENYLEPHRINE 40 MCG/ML (10ML) SYRINGE FOR IV PUSH (FOR BLOOD PRESSURE SUPPORT)
PREFILLED_SYRINGE | INTRAVENOUS | Status: DC | PRN
  Administered 2021-02-16: 120 ug via INTRAVENOUS

## 2021-02-16 MED ORDER — DEXAMETHASONE SODIUM PHOSPHATE 10 MG/ML IJ SOLN
INTRAMUSCULAR | Status: AC
  Filled 2021-02-16: qty 1

## 2021-02-16 MED ORDER — PHENYLEPHRINE 40 MCG/ML (10ML) SYRINGE FOR IV PUSH (FOR BLOOD PRESSURE SUPPORT)
PREFILLED_SYRINGE | INTRAVENOUS | Status: AC
  Filled 2021-02-16: qty 10

## 2021-02-16 MED ORDER — ONDANSETRON HCL 4 MG/2ML IJ SOLN
INTRAMUSCULAR | Status: AC
  Filled 2021-02-16: qty 2

## 2021-02-16 MED ORDER — PROMETHAZINE HCL 25 MG/ML IJ SOLN
INTRAMUSCULAR | Status: AC
  Filled 2021-02-16: qty 1

## 2021-02-16 MED ORDER — ACETAMINOPHEN 500 MG PO TABS: 1000.0000 mg | ORAL_TABLET | ORAL | Status: AC

## 2021-02-16 MED ORDER — BUPIVACAINE-EPINEPHRINE 0.25% -1:200000 IJ SOLN
INTRAMUSCULAR | Status: DC | PRN
  Administered 2021-02-16: 20 mL
  Administered 2021-02-16: 10 mL

## 2021-02-16 MED ORDER — SODIUM CHLORIDE 0.9 % IR SOLN
Status: DC | PRN
  Administered 2021-02-16: 1000 mL

## 2021-02-16 MED ORDER — CHLORHEXIDINE GLUCONATE 0.12 % MT SOLN
15.0000 mL | OROMUCOSAL | Status: AC
  Filled 2021-02-16: qty 15

## 2021-02-16 SURGICAL SUPPLY — 44 items
ADH SKN CLS APL DERMABOND .7 (GAUZE/BANDAGES/DRESSINGS) ×1
APPLIER CLIP 5 13 M/L LIGAMAX5 (MISCELLANEOUS) ×2
APR CLP MED LRG 5 ANG JAW (MISCELLANEOUS) ×1
BAG COUNTER SPONGE SURGICOUNT (BAG) ×2 IMPLANT
BLADE CLIPPER SURG (BLADE) IMPLANT
CANISTER SUCT 3000ML PPV (MISCELLANEOUS) ×2 IMPLANT
CHLORAPREP W/TINT 26 (MISCELLANEOUS) ×2 IMPLANT
CLIP APPLIE 5 13 M/L LIGAMAX5 (MISCELLANEOUS) ×1 IMPLANT
COVER SURGICAL LIGHT HANDLE (MISCELLANEOUS) ×2 IMPLANT
DERMABOND ADVANCED (GAUZE/BANDAGES/DRESSINGS) ×1
DERMABOND ADVANCED .7 DNX12 (GAUZE/BANDAGES/DRESSINGS) ×1 IMPLANT
ELECT REM PT RETURN 9FT ADLT (ELECTROSURGICAL) ×2
ELECTRODE REM PT RTRN 9FT ADLT (ELECTROSURGICAL) ×1 IMPLANT
GLOVE SURG POLY MICRO LF SZ5.5 (GLOVE) ×2 IMPLANT
GLOVE SURG UNDER POLY LF SZ6 (GLOVE) ×2 IMPLANT
GOWN STRL REUS W/ TWL LRG LVL3 (GOWN DISPOSABLE) ×3 IMPLANT
GOWN STRL REUS W/TWL LRG LVL3 (GOWN DISPOSABLE) ×6
KIT BASIN OR (CUSTOM PROCEDURE TRAY) ×2 IMPLANT
KIT TURNOVER KIT B (KITS) ×2 IMPLANT
L-HOOK LAP DISP 36CM (ELECTROSURGICAL) ×2
LHOOK LAP DISP 36CM (ELECTROSURGICAL) ×1 IMPLANT
NDL INSUFFLATION 14GA 120MM (NEEDLE) IMPLANT
NEEDLE INSUFFLATION 14GA 120MM (NEEDLE) IMPLANT
NS IRRIG 1000ML POUR BTL (IV SOLUTION) ×2 IMPLANT
PAD ARMBOARD 7.5X6 YLW CONV (MISCELLANEOUS) ×2 IMPLANT
PENCIL BUTTON HOLSTER BLD 10FT (ELECTRODE) ×2 IMPLANT
POUCH SPECIMEN RETRIEVAL 10MM (ENDOMECHANICALS) ×2 IMPLANT
SCISSORS LAP 5X35 DISP (ENDOMECHANICALS) ×2 IMPLANT
SET IRRIG TUBING LAPAROSCOPIC (IRRIGATION / IRRIGATOR) ×2 IMPLANT
SET TUBE SMOKE EVAC HIGH FLOW (TUBING) ×2 IMPLANT
SLEEVE ENDOPATH XCEL 5M (ENDOMECHANICALS) ×4 IMPLANT
SPECIMEN JAR SMALL (MISCELLANEOUS) ×2 IMPLANT
SUT MNCRL AB 4-0 PS2 18 (SUTURE) ×2 IMPLANT
SUT VIC AB 3-0 SH 27 (SUTURE)
SUT VIC AB 3-0 SH 27XBRD (SUTURE) IMPLANT
SUT VICRYL 0 UR6 27IN ABS (SUTURE) ×2 IMPLANT
TOWEL GREEN STERILE (TOWEL DISPOSABLE) ×2 IMPLANT
TOWEL GREEN STERILE FF (TOWEL DISPOSABLE) ×2 IMPLANT
TRAY LAPAROSCOPIC MC (CUSTOM PROCEDURE TRAY) ×2 IMPLANT
TROCAR XCEL 12X100 BLDLESS (ENDOMECHANICALS) IMPLANT
TROCAR XCEL BLUNT TIP 100MML (ENDOMECHANICALS) ×2 IMPLANT
TROCAR XCEL NON-BLD 5MMX100MML (ENDOMECHANICALS) ×2 IMPLANT
WARMER LAPAROSCOPE (MISCELLANEOUS) ×2 IMPLANT
WATER STERILE IRR 1000ML POUR (IV SOLUTION) ×2 IMPLANT

## 2021-02-16 NOTE — Anesthesia Postprocedure Evaluation (Signed)
Anesthesia Post Note  Patient: Kayla Berger  Procedure(s) Performed: LAPAROSCOPIC CHOLECYSTECTOMY     Patient location during evaluation: PACU Anesthesia Type: General Level of consciousness: awake and alert Pain management: pain level controlled Vital Signs Assessment: post-procedure vital signs reviewed and stable Respiratory status: spontaneous breathing, nonlabored ventilation and respiratory function stable Cardiovascular status: stable and blood pressure returned to baseline Anesthetic complications: no   No notable events documented.  Last Vitals:  Vitals:   02/16/21 0956 02/16/21 1015  BP: 131/75   Pulse: 98 95  Resp: 13   Temp: (!) 36.2 C   SpO2: 92% 92%    Last Pain:  Vitals:   02/16/21 0947  TempSrc:   PainSc: 0-No pain                 Beryle Lathe

## 2021-02-16 NOTE — Interval H&P Note (Signed)
History and Physical Interval Note:  02/16/2021 7:14 AM  Kayla Berger  has presented today for surgery, with the diagnosis of GALLSTONES.  The various methods of treatment have been discussed with the patient and family. After consideration of risks, benefits and other options for treatment, the patient has consented to  Procedure(s): LAPAROSCOPIC CHOLECYSTECTOMY (N/A) as a surgical intervention.  The patient's history has been reviewed, patient examined, no change in status, stable for surgery.  I have reviewed the patient's chart and labs.  Questions were answered to the patient's satisfaction.  Recent US report shows cholelithiasis. Proceed to OR for lap cholecystectomy, plan for discharge home from PACU.   Kayla Berger

## 2021-02-16 NOTE — Transfer of Care (Signed)
Immediate Anesthesia Transfer of Care Note  Patient: Kayla Berger  Procedure(s) Performed: LAPAROSCOPIC CHOLECYSTECTOMY  Patient Location: PACU  Anesthesia Type:General  Level of Consciousness: drowsy  Airway & Oxygen Therapy: Patient Spontanous Breathing and Patient connected to nasal cannula oxygen  Post-op Assessment: Report given to RN and Post -op Vital signs reviewed and stable  Post vital signs: Reviewed and stable  Last Vitals:  Vitals Value Taken Time  BP 137/78 02/16/21 0847  Temp    Pulse 91 02/16/21 0849  Resp 15 02/16/21 0849  SpO2 100 % 02/16/21 0849  Vitals shown include unvalidated device data.  Last Pain:  Vitals:   02/16/21 0620  TempSrc: Oral  PainSc:       Patients Stated Pain Goal: 0 (02/16/21 0553)  Complications: No notable events documented.

## 2021-02-16 NOTE — Discharge Instructions (Addendum)
[facility] [place] [facility] DISCHARGE INSTRUCTIONS  Activity No heavy lifting greater than 15 pounds for 4 weeks after surgery. Ok to shower in 24 hours, but do not bathe or submerge incisions underwater. Do not drive while taking narcotic pain medication.  Wound Care Your incisions are covered with skin glue called Dermabond. This will peel off on its own over time. You may shower and allow warm soapy water to run over your incisions. Gently pat dry. Do not submerge your incision underwater. Monitor your incision for any new redness, tenderness, or drainage.  When to Call us: Fever greater than 100.5 New redness, drainage, or swelling at incision site Severe pain, nausea, or vomiting Jaundice (yellowing of the whites of the eyes or skin)  Follow-up You have an appointment scheduled with Dr. Freida Busman on 03/05/2021 at 10:30am. This will be at the [facility] Surgery office at [address], [address], [place], [place]. Please arrive at least 15 minutes prior to your scheduled appointment time.  For questions or concerns, please call the office at [phone].

## 2021-02-16 NOTE — Op Note (Signed)
Date: 02/16/21  Patient: Kayla Berger MRN: [id number]  Preoperative Diagnosis: Biliary dyskinesia Postoperative Diagnosis: Same  Procedure: Laparoscopic cholecystectomy  Surgeon: Sophronia Simas, MD  EBL: Minimal  Anesthesia: General endotracheal  Specimens: Gallbladder  Indications: [name] is a 61 yo female who presented with frequent postprandial RUQ abdominal. She previously had a HIDA scan several years ago that showed a reduced gallbladder EF of 18%. A recent US showed cholelithiasis. After a discussion of the risks and benefits of surgery, she agreed to proceed with cholecystectomy.  Findings: No evidence of acute cholecystitis, no palpable gallstones.  Procedure details: Informed consent was obtained in the preoperative area prior to the procedure. The patient was brought to the operating room and placed on the table in the supine position. General anesthesia was induced and appropriate lines and drains were placed for intraoperative monitoring. Perioperative antibiotics were administered per SCIP guidelines. The abdomen was prepped and draped in the usual sterile fashion. A pre-procedure timeout was taken verifying patient identity, surgical site and procedure to be performed.  A small infraumbilical skin incision was made, the subcutaneous tissue was divided with cautery, and the umbilical stalk was grasped and elevated. The fascia was incised and the peritoneal cavity was directly visualized. A 36mm Hassan trocar was placed. The peritoneal cavity was inspected with no evidence of visceral or vascular injury. Three 69mm ports were placed in the right subcostal margin, all under direct visualization. The fundus of the gallbladder was grasped and retracted cephalad. The infundibulum was retracted laterally. The cystic triangle was dissected out using cautery and blunt dissection, and the critical view of safety was obtained. The cystic duct and cystic artery were clipped and ligated,  leaving two clips behind on the cystic duct stump. The gallbladder was taken off the liver using cautery. The specimen was placed in an endocatch bag and removed. The surgical site was irrigated with saline until the effluent was clear. Hemostasis was achieved in the gallbladder fossa using cautery. The cystic duct and artery stumps were visually inspected and there was no evidence of bile leak or bleeding. The ports were removed under direct visualization and the abdomen was desufflated. The umbilical port site fascia was closed with a 0 vicryl suture. The skin at all port sites was closed with 4-0 monocryl subcuticular suture. Dermabond was applied.  The patient tolerated the procedure well with no apparent complications. All counts were correct x2 at the end of the procedure. The patient was extubated and taken to PACU in stable condition.  Sophronia Simas, MD 02/16/21 9:39 AM

## 2021-02-16 NOTE — Anesthesia Procedure Notes (Signed)
Procedure Name: Intubation Date/Time: 02/16/2021 7:38 AM Performed by: Vonna Drafts, CRNA Pre-anesthesia Checklist: Patient identified, Emergency Drugs available, Suction available and Patient being monitored Patient Re-evaluated:Patient Re-evaluated prior to induction Oxygen Delivery Method: Circle system utilized Preoxygenation: Pre-oxygenation with 100% oxygen Induction Type: IV induction Ventilation: Mask ventilation without difficulty Laryngoscope Size: Mac and 3 Grade View: Grade I Tube type: Oral Number of attempts: 1 Airway Equipment and Method: Stylet and Oral airway Placement Confirmation: ETT inserted through vocal cords under direct vision, positive ETCO2 and breath sounds checked- equal and bilateral Secured at: 22 cm Tube secured with: Tape Dental Injury: Teeth and Oropharynx as per pre-operative assessment

## 2021-02-17 ENCOUNTER — Encounter (HOSPITAL_COMMUNITY): Payer: Self-pay | Admitting: Surgery

## 2021-02-19 LAB — SURGICAL PATHOLOGY

## 2021-08-02 ENCOUNTER — Emergency Department (HOSPITAL_COMMUNITY): Payer: 59

## 2021-08-02 ENCOUNTER — Other Ambulatory Visit: Payer: Self-pay

## 2021-08-02 ENCOUNTER — Inpatient Hospital Stay (HOSPITAL_COMMUNITY)
Admission: EM | Admit: 2021-08-02 | Discharge: 2021-08-08 | DRG: 682 | Disposition: A | Discharge status: Home / Self Care | Payer: 59 | Attending: Internal Medicine | Admitting: Internal Medicine

## 2021-08-02 ENCOUNTER — Encounter (HOSPITAL_COMMUNITY): Payer: Self-pay

## 2021-08-02 DIAGNOSIS — I10 Essential (primary) hypertension: Secondary | ICD-10-CM | POA: Diagnosis present

## 2021-08-02 DIAGNOSIS — E119 Type 2 diabetes mellitus without complications: Secondary | ICD-10-CM

## 2021-08-02 DIAGNOSIS — Z7982 Long term (current) use of aspirin: Secondary | ICD-10-CM

## 2021-08-02 DIAGNOSIS — N171 Acute kidney failure with acute cortical necrosis: Secondary | ICD-10-CM | POA: Diagnosis not present

## 2021-08-02 DIAGNOSIS — E875 Hyperkalemia: Secondary | ICD-10-CM | POA: Diagnosis present

## 2021-08-02 DIAGNOSIS — Z8249 Family history of ischemic heart disease and other diseases of the circulatory system: Secondary | ICD-10-CM

## 2021-08-02 DIAGNOSIS — E860 Dehydration: Secondary | ICD-10-CM | POA: Diagnosis present

## 2021-08-02 DIAGNOSIS — D649 Anemia, unspecified: Secondary | ICD-10-CM | POA: Diagnosis not present

## 2021-08-02 DIAGNOSIS — E874 Mixed disorder of acid-base balance: Secondary | ICD-10-CM | POA: Diagnosis present

## 2021-08-02 DIAGNOSIS — N390 Urinary tract infection, site not specified: Secondary | ICD-10-CM | POA: Diagnosis not present

## 2021-08-02 DIAGNOSIS — K219 Gastro-esophageal reflux disease without esophagitis: Secondary | ICD-10-CM | POA: Diagnosis present

## 2021-08-02 DIAGNOSIS — J9811 Atelectasis: Secondary | ICD-10-CM | POA: Diagnosis not present

## 2021-08-02 DIAGNOSIS — E8809 Other disorders of plasma-protein metabolism, not elsewhere classified: Secondary | ICD-10-CM | POA: Diagnosis not present

## 2021-08-02 DIAGNOSIS — M1990 Unspecified osteoarthritis, unspecified site: Secondary | ICD-10-CM | POA: Diagnosis present

## 2021-08-02 DIAGNOSIS — E282 Polycystic ovarian syndrome: Secondary | ICD-10-CM | POA: Diagnosis present

## 2021-08-02 DIAGNOSIS — E861 Hypovolemia: Secondary | ICD-10-CM | POA: Diagnosis present

## 2021-08-02 DIAGNOSIS — E785 Hyperlipidemia, unspecified: Secondary | ICD-10-CM | POA: Diagnosis present

## 2021-08-02 DIAGNOSIS — E440 Moderate protein-calorie malnutrition: Secondary | ICD-10-CM | POA: Insufficient documentation

## 2021-08-02 DIAGNOSIS — T502X5A Adverse effect of carbonic-anhydrase inhibitors, benzothiadiazides and other diuretics, initial encounter: Secondary | ICD-10-CM | POA: Diagnosis present

## 2021-08-02 DIAGNOSIS — Z1611 Resistance to penicillins: Secondary | ICD-10-CM | POA: Diagnosis present

## 2021-08-02 DIAGNOSIS — Z833 Family history of diabetes mellitus: Secondary | ICD-10-CM

## 2021-08-02 DIAGNOSIS — N179 Acute kidney failure, unspecified: Secondary | ICD-10-CM | POA: Diagnosis present

## 2021-08-02 DIAGNOSIS — Z794 Long term (current) use of insulin: Secondary | ICD-10-CM | POA: Diagnosis not present

## 2021-08-02 DIAGNOSIS — N170 Acute kidney failure with tubular necrosis: Secondary | ICD-10-CM | POA: Diagnosis present

## 2021-08-02 DIAGNOSIS — R571 Hypovolemic shock: Secondary | ICD-10-CM | POA: Diagnosis present

## 2021-08-02 DIAGNOSIS — E039 Hypothyroidism, unspecified: Secondary | ICD-10-CM | POA: Diagnosis present

## 2021-08-02 DIAGNOSIS — R251 Tremor, unspecified: Secondary | ICD-10-CM | POA: Diagnosis not present

## 2021-08-02 DIAGNOSIS — R296 Repeated falls: Secondary | ICD-10-CM | POA: Diagnosis present

## 2021-08-02 DIAGNOSIS — E872 Acidosis, unspecified: Secondary | ICD-10-CM | POA: Diagnosis present

## 2021-08-02 DIAGNOSIS — B961 Klebsiella pneumoniae [K. pneumoniae] as the cause of diseases classified elsewhere: Secondary | ICD-10-CM | POA: Diagnosis not present

## 2021-08-02 DIAGNOSIS — E1143 Type 2 diabetes mellitus with diabetic autonomic (poly)neuropathy: Secondary | ICD-10-CM | POA: Diagnosis present

## 2021-08-02 DIAGNOSIS — Z6829 Body mass index (BMI) 29.0-29.9, adult: Secondary | ICD-10-CM

## 2021-08-02 DIAGNOSIS — Z809 Family history of malignant neoplasm, unspecified: Secondary | ICD-10-CM

## 2021-08-02 LAB — I-STAT VENOUS BLOOD GAS, ED
Acid-Base Excess: 16 mmol/L — ABNORMAL HIGH (ref 0.0–2.0)
Bicarbonate: 42.1 mmol/L — ABNORMAL HIGH (ref 20.0–28.0)
Calcium, Ion: 0.96 mmol/L — ABNORMAL LOW (ref 1.15–1.40)
HCT: 40 % (ref 36.0–46.0)
Hemoglobin: 13.6 g/dL (ref 12.0–15.0)
O2 Saturation: 91 %
Potassium: 6.9 mmol/L (ref 3.5–5.1)
Sodium: 128 mmol/L — ABNORMAL LOW (ref 135–145)
TCO2: 44 mmol/L — ABNORMAL HIGH (ref 22–32)
pCO2, Ven: 53.3 mmHg (ref 44–60)
pH, Ven: 7.506 — ABNORMAL HIGH (ref 7.25–7.43)
pO2, Ven: 57 mmHg — ABNORMAL HIGH (ref 32–45)

## 2021-08-02 LAB — URINALYSIS, ROUTINE W REFLEX MICROSCOPIC
Bilirubin Urine: NEGATIVE
Glucose, UA: NEGATIVE mg/dL
Hgb urine dipstick: NEGATIVE
Ketones, ur: 5 mg/dL — AB
Nitrite: NEGATIVE
Protein, ur: 100 mg/dL — AB
Specific Gravity, Urine: 1.012 (ref 1.005–1.030)
WBC, UA: >50 WBC/hpf — ABNORMAL HIGH (ref 0–5)
pH: 8 (ref 5.0–8.0)

## 2021-08-02 LAB — COMPREHENSIVE METABOLIC PANEL
ALT: 24 U/L (ref 0–44)
AST: 21 U/L (ref 15–41)
Albumin: 4.1 g/dL (ref 3.5–5.0)
Alkaline Phosphatase: 73 U/L (ref 38–126)
Anion gap: 28 — ABNORMAL HIGH (ref 5–15)
BUN: 68 mg/dL — ABNORMAL HIGH (ref 6–20)
CO2: 36 mmol/L — ABNORMAL HIGH (ref 22–32)
Calcium: 10.7 mg/dL — ABNORMAL HIGH (ref 8.9–10.3)
Chloride: 76 mmol/L — ABNORMAL LOW (ref 98–111)
Creatinine, Ser: 9.87 mg/dL — ABNORMAL HIGH (ref 0.44–1.00)
GFR, Estimated: 4 mL/min — ABNORMAL LOW (ref >60)
Glucose, Bld: 150 mg/dL — ABNORMAL HIGH (ref 70–99)
Potassium: 6.4 mmol/L (ref 3.5–5.1)
Sodium: 140 mmol/L (ref 135–145)
Total Bilirubin: 1 mg/dL (ref 0.3–1.2)
Total Protein: 7.1 g/dL (ref 6.5–8.1)

## 2021-08-02 LAB — CBC WITH DIFFERENTIAL/PLATELET
Abs Immature Granulocytes: 0.04 10*3/uL (ref 0.00–0.07)
Basophils Absolute: 0 10*3/uL (ref 0.0–0.1)
Basophils Relative: 0 %
Eosinophils Absolute: 0 10*3/uL (ref 0.0–0.5)
Eosinophils Relative: 0 %
HCT: 40 % (ref 36.0–46.0)
Hemoglobin: 13.3 g/dL (ref 12.0–15.0)
Immature Granulocytes: 0 %
Lymphocytes Relative: 14 %
Lymphs Abs: 1.7 10*3/uL (ref 0.7–4.0)
MCH: 29.1 pg (ref 26.0–34.0)
MCHC: 33.3 g/dL (ref 30.0–36.0)
MCV: 87.5 fL (ref 80.0–100.0)
Monocytes Absolute: 0.7 10*3/uL (ref 0.1–1.0)
Monocytes Relative: 6 %
Neutro Abs: 10 10*3/uL — ABNORMAL HIGH (ref 1.7–7.7)
Neutrophils Relative %: 80 %
Platelets: 362 10*3/uL (ref 150–400)
RBC: 4.57 MIL/uL (ref 3.87–5.11)
RDW: 13.5 % (ref 11.5–15.5)
WBC: 12.5 10*3/uL — ABNORMAL HIGH (ref 4.0–10.5)
nRBC: 0 % (ref 0.0–0.2)

## 2021-08-02 LAB — BLOOD GAS, VENOUS
Acid-Base Excess: 17.7 mmol/L — ABNORMAL HIGH (ref 0.0–2.0)
Bicarbonate: 44.2 mmol/L — ABNORMAL HIGH (ref 20.0–28.0)
Drawn by: 1470
O2 Saturation: 55.1 %
Patient temperature: 37
pCO2, Ven: 58 mmHg (ref 44–60)
pH, Ven: 7.49 — ABNORMAL HIGH (ref 7.25–7.43)
pO2, Ven: <31 mmHg — CL (ref 32–45)

## 2021-08-02 LAB — RENAL FUNCTION PANEL
Albumin: 3.5 g/dL (ref 3.5–5.0)
Anion gap: 31 — ABNORMAL HIGH (ref 5–15)
BUN: 73 mg/dL — ABNORMAL HIGH (ref 6–20)
CO2: 33 mmol/L — ABNORMAL HIGH (ref 22–32)
Calcium: 9.7 mg/dL (ref 8.9–10.3)
Chloride: 74 mmol/L — ABNORMAL LOW (ref 98–111)
Creatinine, Ser: 10.23 mg/dL — ABNORMAL HIGH (ref 0.44–1.00)
GFR, Estimated: 4 mL/min — ABNORMAL LOW (ref >60)
Glucose, Bld: 183 mg/dL — ABNORMAL HIGH (ref 70–99)
Phosphorus: 7.2 mg/dL — ABNORMAL HIGH (ref 2.5–4.6)
Potassium: 5.8 mmol/L — ABNORMAL HIGH (ref 3.5–5.1)
Sodium: 138 mmol/L (ref 135–145)

## 2021-08-02 LAB — CBG MONITORING, ED: Glucose-Capillary: 319 mg/dL — ABNORMAL HIGH (ref 70–99)

## 2021-08-02 LAB — LIPASE, BLOOD: Lipase: 45 U/L (ref 11–51)

## 2021-08-02 LAB — LACTIC ACID, PLASMA
Lactic Acid, Venous: 4.1 mmol/L (ref 0.5–1.9)
Lactic Acid, Venous: 6.6 mmol/L (ref 0.5–1.9)
Lactic Acid, Venous: 8.1 mmol/L (ref 0.5–1.9)

## 2021-08-02 LAB — PHOSPHORUS: Phosphorus: 8 mg/dL — ABNORMAL HIGH (ref 2.5–4.6)

## 2021-08-02 LAB — MAGNESIUM: Magnesium: 2.7 mg/dL — ABNORMAL HIGH (ref 1.7–2.4)

## 2021-08-02 LAB — POTASSIUM: Potassium: >7.5 mmol/L (ref 3.5–5.1)

## 2021-08-02 LAB — HIV ANTIBODY (ROUTINE TESTING W REFLEX): HIV Screen 4th Generation wRfx: NONREACTIVE

## 2021-08-02 MED ORDER — LIDOCAINE HCL (PF) 1 % IJ SOLN: 5.0000 mL | INTRAMUSCULAR | Status: DC | PRN

## 2021-08-02 MED ORDER — INSULIN ASPART 100 UNIT/ML IV SOLN
5.0000 [IU] | Freq: Once | INTRAVENOUS | Status: AC
  Administered 2021-08-02: 5 [IU] via INTRAVENOUS

## 2021-08-02 MED ORDER — CALCIUM GLUCONATE-NACL 1-0.675 GM/50ML-% IV SOLN
1.0000 g | Freq: Once | INTRAVENOUS | Status: AC
  Administered 2021-08-03: 1000 mg via INTRAVENOUS
  Filled 2021-08-02: qty 50

## 2021-08-02 MED ORDER — ACETAMINOPHEN 325 MG PO TABS
650.0000 mg | ORAL_TABLET | Freq: Four times a day (QID) | ORAL | Status: DC | PRN
  Administered 2021-08-03 – 2021-08-08 (×4): 650 mg via ORAL
  Filled 2021-08-02 (×4): qty 2

## 2021-08-02 MED ORDER — ACETAMINOPHEN 650 MG RE SUPP: 650.0000 mg | Freq: Four times a day (QID) | RECTAL | Status: DC | PRN

## 2021-08-02 MED ORDER — CHLORHEXIDINE GLUCONATE CLOTH 2 % EX PADS
6.0000 | MEDICATED_PAD | Freq: Every day | CUTANEOUS | Status: DC
  Administered 2021-08-03 – 2021-08-07 (×5): 6 via TOPICAL

## 2021-08-02 MED ORDER — LACTATED RINGERS IV BOLUS
1000.0000 mL | Freq: Once | INTRAVENOUS | Status: AC
  Administered 2021-08-02: 1000 mL via INTRAVENOUS

## 2021-08-02 MED ORDER — SODIUM CHLORIDE 0.9 % IV SOLN
1.0000 g | INTRAVENOUS | Status: AC
  Administered 2021-08-02 – 2021-08-04 (×2): 1 g via INTRAVENOUS
  Filled 2021-08-02 (×2): qty 10

## 2021-08-02 MED ORDER — TRAMADOL HCL 50 MG PO TABS
50.0000 mg | ORAL_TABLET | Freq: Four times a day (QID) | ORAL | Status: DC | PRN
  Administered 2021-08-04 – 2021-08-07 (×6): 50 mg via ORAL
  Filled 2021-08-02 (×6): qty 1

## 2021-08-02 MED ORDER — LIDOCAINE-PRILOCAINE 2.5-2.5 % EX CREA: 1.0000 | TOPICAL_CREAM | CUTANEOUS | Status: DC | PRN

## 2021-08-02 MED ORDER — PENTAFLUOROPROP-TETRAFLUOROETH EX AERO: 1.0000 | INHALATION_SPRAY | CUTANEOUS | Status: DC | PRN

## 2021-08-02 MED ORDER — ATORVASTATIN CALCIUM 10 MG PO TABS
10.0000 mg | ORAL_TABLET | Freq: Every day | ORAL | Status: DC
Start: 2021-08-02 — End: 2021-08-08
  Administered 2021-08-03 – 2021-08-07 (×5): 10 mg via ORAL
  Filled 2021-08-02 (×6): qty 1

## 2021-08-02 MED ORDER — HEPARIN SODIUM (PORCINE) 1000 UNIT/ML DIALYSIS
1000.0000 [IU] | INTRAMUSCULAR | Status: DC | PRN
  Filled 2021-08-02 (×3): qty 1

## 2021-08-02 MED ORDER — ASPIRIN 81 MG PO CHEW
81.0000 mg | CHEWABLE_TABLET | Freq: Every morning | ORAL | Status: DC
Start: 2021-08-03 — End: 2021-08-08
  Administered 2021-08-03 – 2021-08-08 (×6): 81 mg via ORAL
  Filled 2021-08-02 (×6): qty 1

## 2021-08-02 MED ORDER — ONDANSETRON HCL 4 MG/2ML IJ SOLN
4.0000 mg | Freq: Four times a day (QID) | INTRAMUSCULAR | Status: DC | PRN
  Administered 2021-08-02: 4 mg via INTRAVENOUS
  Filled 2021-08-02: qty 2

## 2021-08-02 MED ORDER — LACTATED RINGERS IV SOLN: INTRAVENOUS | Status: DC

## 2021-08-02 MED ORDER — DULOXETINE HCL 60 MG PO CPEP
60.0000 mg | ORAL_CAPSULE | Freq: Two times a day (BID) | ORAL | Status: DC
  Administered 2021-08-03 – 2021-08-08 (×11): 60 mg via ORAL
  Filled 2021-08-02 (×2): qty 1
  Filled 2021-08-02: qty 2
  Filled 2021-08-02 (×9): qty 1

## 2021-08-02 MED ORDER — ANTICOAGULANT SODIUM CITRATE 4% (200MG/5ML) IV SOLN
5.0000 mL | Status: DC | PRN
  Filled 2021-08-02: qty 5

## 2021-08-02 MED ORDER — SODIUM CHLORIDE 0.9 % IV SOLN: INTRAVENOUS | Status: DC

## 2021-08-02 MED ORDER — ONDANSETRON HCL 4 MG PO TABS
4.0000 mg | ORAL_TABLET | Freq: Four times a day (QID) | ORAL | Status: DC | PRN
  Administered 2021-08-04 – 2021-08-06 (×4): 4 mg via ORAL
  Filled 2021-08-02 (×4): qty 1

## 2021-08-02 MED ORDER — DEXTROSE 10 % IV SOLN: Freq: Once | INTRAVENOUS | Status: AC

## 2021-08-02 MED ORDER — INSULIN ASPART 100 UNIT/ML IJ SOLN
0.0000 [IU] | INTRAMUSCULAR | Status: DC
  Administered 2021-08-02: 7 [IU] via SUBCUTANEOUS
  Administered 2021-08-03: 1 [IU] via SUBCUTANEOUS
  Administered 2021-08-03: 2 [IU] via SUBCUTANEOUS
  Administered 2021-08-03: 5 [IU] via SUBCUTANEOUS
  Administered 2021-08-03 – 2021-08-04 (×3): 2 [IU] via SUBCUTANEOUS
  Administered 2021-08-04 (×2): 1 [IU] via SUBCUTANEOUS
  Administered 2021-08-05: 3 [IU] via SUBCUTANEOUS
  Administered 2021-08-05 – 2021-08-06 (×4): 2 [IU] via SUBCUTANEOUS
  Administered 2021-08-06: 3 [IU] via SUBCUTANEOUS
  Administered 2021-08-06 (×2): 2 [IU] via SUBCUTANEOUS
  Administered 2021-08-06: 3 [IU] via SUBCUTANEOUS
  Administered 2021-08-07 (×3): 2 [IU] via SUBCUTANEOUS
  Administered 2021-08-07: 3 [IU] via SUBCUTANEOUS
  Administered 2021-08-07 (×3): 2 [IU] via SUBCUTANEOUS
  Administered 2021-08-08: 1 [IU] via SUBCUTANEOUS
  Administered 2021-08-08: 5 [IU] via SUBCUTANEOUS
  Administered 2021-08-08: 2 [IU] via SUBCUTANEOUS

## 2021-08-02 MED ORDER — SODIUM ZIRCONIUM CYCLOSILICATE 10 G PO PACK
10.0000 g | PACK | Freq: Every day | ORAL | Status: DC
  Administered 2021-08-02: 10 g via ORAL
  Filled 2021-08-02: qty 1

## 2021-08-02 MED ORDER — DEXTROSE 50 % IV SOLN
1.0000 | Freq: Once | INTRAVENOUS | Status: AC
  Administered 2021-08-02: 50 mL via INTRAVENOUS
  Filled 2021-08-02: qty 50

## 2021-08-02 MED ORDER — ALBUTEROL SULFATE (2.5 MG/3ML) 0.083% IN NEBU
2.5000 mg | INHALATION_SOLUTION | Freq: Once | RESPIRATORY_TRACT | Status: AC
  Administered 2021-08-02: 2.5 mg via RESPIRATORY_TRACT
  Filled 2021-08-02: qty 3

## 2021-08-02 MED ORDER — ALBUTEROL SULFATE (2.5 MG/3ML) 0.083% IN NEBU
2.5000 mg | INHALATION_SOLUTION | Freq: Once | RESPIRATORY_TRACT | Status: AC
  Administered 2021-08-03: 2.5 mg via RESPIRATORY_TRACT
  Filled 2021-08-02: qty 3

## 2021-08-02 NOTE — ED Triage Notes (Signed)
Pt arrived POV from home c/o N/V x3 days. Pt states she had a feeling of acid reflux before it starts. Pt s also concerned because she keeps falling at home.

## 2021-08-02 NOTE — Assessment & Plan Note (Signed)
Hold metformin. Hold home lantus (hasnt taken in past couple of nights anyhow due to low BGLs)  1. Sensitive SSI Q4H for the moment

## 2021-08-02 NOTE — Consult Note (Signed)
NAME:  Kayla Berger, MRN:  [id number], DOB:  Jan 26, 1961, LOS: 0 ADMISSION DATE:  08/02/2021, CONSULTATION DATE: 08/02/2021 REFERRING MD: Dr. Signe Colt, CHIEF COMPLAINT: Need for dialysis access  History of Present Illness:  Patient with refractory hyperkalemia Will need dialysis  Background history of hypertension, diabetes  Came in with history of severe intractable nausea/vomiting, lasting about 3 days Multiple falls at home Feels dry  Pertinent  Medical History   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   Migraines    occ   PCOS (polycystic ovarian syndrome) 1986   PONV (postoperative nausea and vomiting)    Shoulder pain    Thyroid disease    Trochanteric bursitis of left hip    Vaginal cysts    4 have been removed    Significant Hospital Events: Including procedures, antibiotic start and stop dates in addition to other pertinent events   08/02/2021 PCCM consult  Interim History / Subjective:  Inability to keep anything down  Objective   Blood pressure 96/62, pulse (!) 101, temperature 98.5 F (36.9 C), temperature source Oral, resp. rate 16, height 5\' 3"  (1.6 m), weight 76.8 kg, last menstrual period 10/06/2012, SpO2 100 %.        Intake/Output Summary (Last 24 hours) at 08/02/2021 2315 Last data filed at 08/02/2021 2003 Gross per 24 hour  Intake 1000 ml  Output --  Net 1000 ml   Filed Weights   08/02/21 1400 08/02/21 2236  Weight: 74.4 kg 76.8 kg    Examination: General: Acutely ill-appearing, middle-aged HENT: Dry oral mucosa Lungs: Clear breath sounds Cardiovascular: S1-S2 appreciated Abdomen: Bowel sounds appreciated Extremities: No clubbing, no  edema Neuro: Alert and oriented x3 GU: Minimal urine output  Resolved Hospital Problem list     Assessment & Plan:  Hyperkalemia Acute kidney failure -Receiving therapies to correct hyperkalemia  Type 2 diabetes -Home medications on hold -On SSI  Hypertension -Home medications on hold  Will plan to place HD catheter for initiation of dialysis for hyperkalemia  Best Practice (right click and "Reselect all SmartList Selections" daily)   Diet/type: Regular consistency (see orders) DVT prophylaxis: SCD GI prophylaxis: N/A Lines: N/A Foley:  Yes, and it is still needed Code Status:  full code Last date of multidisciplinary goals of care discussion [pending]  Labs   CBC: Recent Labs  Lab 08/02/21 1351 08/02/21 1848  WBC 12.5*  --   NEUTROABS 10.0*  --   HGB 13.3 13.6  HCT 40.0 40.0  MCV 87.5  --   PLT 362  --     Basic Metabolic Panel: Recent Labs  Lab 08/02/21 1351 08/02/21 1832 08/02/21 1848 08/02/21 2100  NA 140  --  128*  --   K 6.4*  --  6.9* >7.5*  CL 76*  --   --   --   CO2 36*  --   --   --   GLUCOSE 150*  --   --   --   BUN 68*  --   --   --   CREATININE 9.87*  --   --   --  CALCIUM 10.7*  --   --   --   MG  --  2.7*  --   --   PHOS  --  8.0*  --   --    GFR: Estimated Creatinine Clearance: 6 mL/min (A) (by C-G formula based on SCr of 9.87 mg/dL (H)). Recent Labs  Lab 08/02/21 1351 08/02/21 1832 08/02/21 1915  WBC 12.5*  --   --   LATICACIDVEN  --  4.1* 6.6*    Liver Function Tests: Recent Labs  Lab 08/02/21 1351  AST 21  ALT 24  ALKPHOS 73  BILITOT 1.0  PROT 7.1  ALBUMIN 4.1   Recent Labs  Lab 08/02/21 1351  LIPASE 45   No results for input(s): "AMMONIA" in the last 168 hours.  ABG    Component Value Date/Time   HCO3 44.2 (H) 08/02/2021 2245   TCO2 44 (H) 08/02/2021 1848   O2SAT 55.1 08/02/2021 2245     Coagulation Profile: No results for input(s): "INR", "PROTIME" in the last 168 hours.  Cardiac Enzymes: No  results for input(s): "CKTOTAL", "CKMB", "CKMBINDEX", "TROPONINI" in the last 168 hours.  HbA1C: Hgb A1c MFr Bld  Date/Time Value Ref Range Status  02/13/2021 09:12 AM 7.1 (H) 4.8 - 5.6 % Final    Comment:    (NOTE) Pre diabetes:          5.7%-6.4%  Diabetes:              >6.4%  Glycemic control for   <7.0% adults with diabetes   10/31/2016 12:20 PM 12.3 (H) 4.8 - 5.6 % Final    Comment:    (NOTE) Pre diabetes:          5.7%-6.4% Diabetes:              >6.4% Glycemic control for   <7.0% adults with diabetes     CBG: Recent Labs  Lab 08/02/21 1945  GLUCAP 319*    Review of Systems:   Feels thirsty  Past Medical History:  She,  has a past medical history of Arthritis, Complication of anesthesia, Dysrhythmia, GERD (gastroesophageal reflux disease), History of kidney stones, Hypertension, Hypothyroidism, IDDM (insulin dependent diabetes mellitus), Migraines, PCOS (polycystic ovarian syndrome) (1986), PONV (postoperative nausea and vomiting), Shoulder pain, Thyroid disease, Trochanteric bursitis of left hip, and Vaginal cysts.   Surgical History:   Past Surgical History:  Procedure Laterality Date   APPENDECTOMY  1967   CARDIAC CATHETERIZATION Bilateral    cataracts   CHOLECYSTECTOMY N/A 02/16/2021   Procedure: LAPAROSCOPIC CHOLECYSTECTOMY;  Surgeon: [name], MD;  Location: [facility] OR;  Service: General;  Laterality: N/A;   KYPHOPLASTY N/A 10/31/2016   Procedure: KYPHOPLASTY LUMBAR ONE, LUMBAR TWO AND  LUMBAR THREE;  Surgeon: Venita Lick, MD;  Location: [facility] OR;  Service: Orthopedics;  Laterality: N/A;   nsvd  2001   normal vaginal del   sonobysterogram, laporoscopy  1997   fertility test     Social History:   reports that she has never smoked. She has never used smokeless tobacco. She reports that she does not drink alcohol and does not use drugs.   Family History:  Her family history includes Cancer in her father; Diabetes in her maternal grandfather, mother,  and paternal grandfather; Heart attack in her maternal grandfather; Hypertension in her father, maternal grandmother, mother, and sister.   Allergies Allergies  Allergen Reactions   Hydrocodone Itching, Swelling and Other (See Comments)    Numbness of the face/hands and  tingling also     Home Medications  Prior to Admission medications   Medication Sig Start Date End Date Taking? Authorizing Provider  Alpha Lipoic Acid 200 MG CAPS Take 200 mg by mouth at bedtime.   Yes [provider]  Ascorbic Acid (VITAMIN C) 1000 MG tablet Take 1,000 mg by mouth daily.   Yes [provider]  aspirin 81 MG chewable tablet Chew 81 mg by mouth in the morning.   Yes [provider]  atorvastatin (LIPITOR) 10 MG tablet Take 10 mg by mouth at bedtime.   Yes [provider]  Biotin 10000 MCG TABS Take 10,000 mcg by mouth daily.   Yes [provider]  cetirizine (ZYRTEC) 10 MG tablet Take 10 mg by mouth in the morning.   Yes [provider]  Cholecalciferol (VITAMIN D3) 50 MCG (2000 UT) TABS Take 2,000 Units by mouth 2 (two) times daily.   Yes [provider]  CRANBERRY PO Take 15,000 mg by mouth in the morning.   Yes [provider]  DULoxetine (CYMBALTA) 60 MG capsule Take 60 mg by mouth 2 (two) times daily.   Yes [provider]  EXCEDRIN MIGRAINE [phone] MG tablet Take 1 tablet by mouth every 6 (six) hours as needed for headache or migraine.   Yes [provider]  gabapentin (NEURONTIN) 800 MG tablet Take 400 mg by mouth at bedtime.   Yes [provider]  HUMALOG KWIKPEN 100 UNIT/ML KwikPen Inject 4-12 Units into the skin 3 (three) times daily as needed (for a BGL greater than 100).   Yes [provider]  insulin glargine, 2 Unit Dial, (TOUJEO MAX SOLOSTAR) 300 UNIT/ML Solostar Pen Inject 28 Units into the skin at bedtime.   Yes [provider]  lisinopril-hydrochlorothiazide (ZESTORETIC)  10-12.5 MG tablet Take 1 tablet by mouth daily.   Yes [provider]  metFORMIN (GLUCOPHAGE) 1000 MG tablet Take 1,000 mg by mouth 2 (two) times daily with a meal.   Yes [provider]  Multiple Vitamins-Minerals (CENTRUM WOMEN) TABS Take 1 tablet by mouth daily with breakfast.   Yes [provider]  ondansetron (ZOFRAN) 4 MG tablet Take 1 tablet (4 mg total) by mouth every 6 (six) hours as needed for nausea or vomiting. 02/16/21  Yes Dwan Bolt, MD  OZEMPIC, 0.25 OR 0.5 MG/DOSE, 2 MG/3ML SOPN Inject 0.25 mg into the skin every Tuesday.   Yes [provider]  Teriparatide, Recombinant, 620 MCG/2.48ML SOPN Inject 20 mcg into the skin at bedtime. 09/21/20  Yes [provider]  traMADol (ULTRAM) 50 MG tablet Take 50 mg by mouth every 6 (six) hours as needed (for pain).   Yes [provider]  zinc gluconate 50 MG tablet Take 50 mg by mouth in the morning.   Yes [provider]    Sherrilyn Rist, MD [place] PCCM Pager: See Shea Evans

## 2021-08-02 NOTE — ED Provider Triage Note (Signed)
Emergency Medicine Provider Triage Evaluation Note  SARRAH FIORENZA , a 61 y.o. female  was evaluated in triage.  Pt complains of generalized abdominal pain, persistent vomiting, diarrhea.  Symptoms have been going on for the past 4 days.  Reports feeling like she has acid reflux as well.  Her entire body has been cramping.  No sick contacts with similar symptoms.  Review of Systems  Positive: Abdominal pain, vomiting, diarrhea, body aches  Negative: Chest pain  Physical Exam  BP 110/67   Pulse 96   Temp 98.5 F (36.9 C) (Oral)   Resp 16   LMP 10/06/2012   SpO2 100%  Gen:   Awake, no distress   Resp:  Normal effort  MSK:   Moves extremities without difficulty  Other:  Abdomen generally tender  Medical Decision Making  Medically screening exam initiated at 1:46 PM.  Appropriate orders placed.  Khristine L Sarwar was informed that the remainder of the evaluation will be completed by another provider, this initial triage assessment does not replace that evaluation, and the importance of remaining in the ED until their evaluation is complete.  Labs ordered   Dietrich Pates, [place] 08/02/21 1348

## 2021-08-02 NOTE — ED Notes (Signed)
Patient transported to CT 

## 2021-08-02 NOTE — Consult Note (Signed)
[facility]  HISTORY AND PHYSICAL  Kayla Berger is an 61 y.o. female.    Chief Complaint: intractable n/v  HPI: Pt is a 57F with a PMH sig for HTN, HLD, DM II, GERD, h/o kidney stones who is now seen in consultation at the request of Dr Laverta Baltimore for evaluation and recommendations surrounding AKI and hyperkalemia.    Pt has had a 3 day history of progressive n/v.  She has been vomiting every hour, not able to keep anything down today.  She has been trying to take her medicines as prescribed which include lisinopril/ HCTZ and metformin.  No NSAIDs.    OF note, she is on ozempic for DM II and last week had a dosage increase. She denies f/c, SOB, CP LE edema.  Her mouth is extremely dry and she is thirsty.    She is still making urine- has noticed that her urine output has been decreasing over the past several days as well.    Was able to talk to her sister, a PCCM MD in [place].  They were all at the beach last week and her sister noted that pt was dizzy and actually fell at least 4x at the beach.  Pt corroborates this and has noted that she feels woozy/ lightheaded.  Was out in the sun as well all week.      Baseline Cr is 0.74 and she was at this baseline in January.  Today, Cr is 9.87, BUN 68, K 6.4, Cl 76, CO2 36, Ca 10.7.  lipase and LFTs WNL.  UA with > 50 WBCs/ hpf.  In this setting we are asked to see.    PMH: 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   Migraines    occ   PCOS (polycystic ovarian syndrome) 1986   PONV (postoperative nausea and vomiting)    Shoulder pain    Thyroid disease    Trochanteric bursitis of left hip    Vaginal cysts    4 have been  removed   PSH: Past Surgical History:  Procedure Laterality Date   APPENDECTOMY  1967   CARDIAC CATHETERIZATION Bilateral    cataracts   CHOLECYSTECTOMY N/A 02/16/2021   Procedure: LAPAROSCOPIC CHOLECYSTECTOMY;  Surgeon: Dwan Bolt, MD;  Location: [facility];  Service: General;  Laterality: N/A;   KYPHOPLASTY N/A 10/31/2016   Procedure: KYPHOPLASTY LUMBAR ONE, LUMBAR TWO AND  LUMBAR THREE;  Surgeon: Melina Schools, MD;  Location: [facility];  Service: Orthopedics;  Laterality: N/A;   nsvd  2001   normal vaginal del   sonobysterogram, laporoscopy  1997   fertility test    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   Migraines    occ  PCOS (polycystic ovarian syndrome) 1986   PONV (postoperative nausea and vomiting)    Shoulder pain    Thyroid disease    Trochanteric bursitis of left hip    Vaginal cysts    4 have been removed    Medications:  Scheduled:  insulin aspart  0-9 Units Subcutaneous Q4H   sodium zirconium cyclosilicate  10 g Oral Daily    (Not in a hospital admission)   ALLERGIES:   Allergies  Allergen Reactions   Hydrocodone Other (See Comments)    Numbness and tingling    FAM HX: Family History  Problem Relation Age of Onset   Hypertension Mother    Diabetes Mother    Hypertension Father    Cancer Father    Hypertension Sister    Hypertension Maternal Grandmother    Heart attack Maternal Grandfather    Diabetes Maternal Grandfather    Diabetes Paternal Grandfather     Social History:   reports that she has never smoked. She has never used smokeless tobacco. She reports that she does not drink alcohol and does not use drugs.  ROS: ROS: all other  systems reviewed and are negative except as per HPI   Blood pressure 106/69, pulse 100, temperature 98.5 F (36.9 C), temperature source Oral, resp. rate (!) 27, height $RemoveBe'5\' 3"'OKHBWfSsH$  (1.6 m), weight 74.4 kg, last menstrual period 10/06/2012, SpO2 98 %. PHYSICAL EXAM: Physical Exam GEN NADm/r/g, lying in bed HEENT EOMI PERRL, dry MM NECK flat neck veins PULM clear bilaterally CV tachycardic, no m/r/g ABD soft, hyperactive BS EXT no LE edema NEURO AAO x 3 SKIN poor turgor   Results for orders placed or performed during the hospital encounter of 08/02/21 (from the past 48 hour(s))  Urinalysis, Routine w reflex microscopic Urine, Clean Catch     Status: Abnormal   Collection Time: 08/02/21  1:48 PM  Result Value Ref Range   Color, Urine YELLOW YELLOW   APPearance CLEAR CLEAR   Specific Gravity, Urine 1.012 1.005 - 1.030   pH 8.0 5.0 - 8.0   Glucose, UA NEGATIVE NEGATIVE mg/dL   Hgb urine dipstick NEGATIVE NEGATIVE   Bilirubin Urine NEGATIVE NEGATIVE   Ketones, ur 5 (A) NEGATIVE mg/dL   Protein, ur 100 (A) NEGATIVE mg/dL   Nitrite NEGATIVE NEGATIVE   Leukocytes,Ua SMALL (A) NEGATIVE   RBC / HPF 0-5 0 - 5 RBC/hpf   WBC, UA >50 (H) 0 - 5 WBC/hpf   Bacteria, UA RARE (A) NONE SEEN   Squamous Epithelial / LPF 0-5 0 - 5   WBC Clumps PRESENT    Mucus PRESENT     Comment: Performed at [facility], [address] [place], [place] [address]  Comprehensive metabolic panel     Status: Abnormal   Collection Time: 08/02/21  1:51 PM  Result Value Ref Range   Sodium 140 135 - 145 mmol/L   Potassium 6.4 (HH) 3.5 - 5.1 mmol/L    Comment: NO VISIBLE HEMOLYSIS CRITICAL RESULT CALLED TO, READ BACK BY AND VERIFIED WITH: MEAGAN ROBERTS,RN [id number] 08/02/2021 WBOND    Chloride 76 (L) 98 - 111 mmol/L   CO2 36 (H) 22 - 32 mmol/L   Glucose, Bld 150 (H) 70 - 99 mg/dL    Comment: Glucose reference range applies only to samples taken after fasting for at least 8 hours.   BUN 68 (H) 6 - 20 mg/dL    Creatinine, Ser 9.87 (H) 0.44 - 1.00 mg/dL   Calcium 10.7 (  H) 8.9 - 10.3 mg/dL   Total Protein 7.1 6.5 - 8.1 g/dL   Albumin 4.1 3.5 - 5.0 g/dL   AST 21 15 - 41 U/L   ALT 24 0 - 44 U/L   Alkaline Phosphatase 73 38 - 126 U/L   Total Bilirubin 1.0 0.3 - 1.2 mg/dL   GFR, Estimated 4 (L) >60 mL/min    Comment: (NOTE) Calculated using the CKD-EPI Creatinine Equation (2021)    Anion gap 28 (H) 5 - 15    Comment: Electrolytes repeated to confirm. Performed at [facility], [place] [address], [place], [place] [address]   CBC with Differential     Status: Abnormal   Collection Time: 08/02/21  1:51 PM  Result Value Ref Range   WBC 12.5 (H) 4.0 - 10.5 K/uL   RBC 4.57 3.87 - 5.11 MIL/uL   Hemoglobin 13.3 12.0 - 15.0 g/dL   HCT 40.0 36.0 - 46.0 %   MCV 87.5 80.0 - 100.0 fL   MCH 29.1 26.0 - 34.0 pg   MCHC 33.3 30.0 - 36.0 g/dL   RDW 13.5 11.5 - 15.5 %   Platelets 362 150 - 400 K/uL   nRBC 0.0 0.0 - 0.2 %   Neutrophils Relative % 80 %   Neutro Abs 10.0 (H) 1.7 - 7.7 K/uL   Lymphocytes Relative 14 %   Lymphs Abs 1.7 0.7 - 4.0 K/uL   Monocytes Relative 6 %   Monocytes Absolute 0.7 0.1 - 1.0 K/uL   Eosinophils Relative 0 %   Eosinophils Absolute 0.0 0.0 - 0.5 K/uL   Basophils Relative 0 %   Basophils Absolute 0.0 0.0 - 0.1 K/uL   Immature Granulocytes 0 %   Abs Immature Granulocytes 0.04 0.00 - 0.07 K/uL    Comment: Performed at [facility] [address], [place], [place] [address]  Lipase, blood     Status: None   Collection Time: 08/02/21  1:51 PM  Result Value Ref Range   Lipase 45 11 - 51 U/L    Comment: Performed at [facility] [address], [place], [place] [address]    US Renal  Result Date: 08/02/2021 CLINICAL DATA:  Kidney failure. EXAM: RENAL / URINARY TRACT ULTRASOUND COMPLETE COMPARISON:  None Available. FINDINGS: Right Kidney: Renal measurements: 9.8 x 5.1 x 5.0 cm = volume: 131.9 mL. Normal renal cortical thickness and echogenicity without focal  lesions or hydronephrosis. Left Kidney: Renal measurements: 10.3 x 5.1 x 4.6 cm = volume: 126.9 mL. Normal renal cortical thickness and echogenicity without focal lesions or hydronephrosis. Bladder: Appears normal for degree of bladder distention. Other: None. IMPRESSION: Normal renal ultrasound examination. Electronically Signed   By: Marijo Sanes M.D.   On: 08/02/2021 18:21   DG Chest 2 View  Result Date: 08/02/2021 CLINICAL DATA:  Reflux. EXAM: CHEST - 2 VIEW COMPARISON:  11/08/2009 FINDINGS: The heart size and mediastinal contours are within normal limits. Both lungs are clear. The visualized skeletal structures are unremarkable. IMPRESSION: No active cardiopulmonary disease. Electronically Signed   By: Franchot Gallo M.D.   On: 08/02/2021 15:12    Assessment/Plan  AKI: normal eGFR in January.  In the setting of 3 day history of n/v and also about a week history of dizziness/ falls.  Suspect pre-renal insults resulting in profound AKI.  Renal US negative. - getting fluid resuscitated with LR boluses in ED - agree with isotonic fluid administration afterwards with NS @ 125 mL/ hr -  strict I/O - aggressive rx of hyperk with albuterol, lokelma, insulin/ dextrose - hold lisionpril and HCTZ and metformin - adding on lactate  - will closely follow labs- will try to turn this around without dialysis but have discussed with pt and her sisters that it may need to be done if no improvement  - NPO for now  - UA with > 50 WBCs/ hpf, send urine culture--if negative will need to consider concomitant AIN  2.  Intractable n/v:   - LFTs and lipase WNL  - attributed to dose increase of Ozempic  - consider imaging- would be OK with CT abd/ pelvis without contrast (especially if lactate elevated), defer to admitting MD  3.  Metabolic alkalosis:  - in the setting of hypovolemia from extrarenal volume loss (n/v) and loss of chloride ions   - not compensated- would be hard to hypoventilate without SpO2  falling  4.  Hyperkalemia:  - getting insulin/ dextrose, lokelma, IVFs, and albuterol  - serial K and BMP checks  5.  DM II  - per primary  6.  HTN  - holding antihypertensives  7.  Dispo: admitted  - per primary  Madelon Lips 08/02/2021, 6:24 PM

## 2021-08-02 NOTE — Assessment & Plan Note (Addendum)
Lactates of 4.1 and 6.6 on repeat. Pt actually alkalotic (not acidotic) on blood gas Suspect this stems primarily from dehydration.  Decreased O2 offloading from hemoglobin to tissues due to alkalosis also a possibility.  1. Normal saline IVF 2. Serial lactates

## 2021-08-02 NOTE — Assessment & Plan Note (Signed)
Possibly: increased ozempic dose 2 weeks ago causing nausea which resulted in decreased PO intake of fluids and causing AKF?  AKI due to pre-renal / dehydration is known black box warning on this drug class, especially when combined with other meds that can cause dehydration / AKI (IE the hydrochlorothiazide she takes for HTN).  -Vomiting for past ~3 days resulting in met alkalosis (loss of acids) DDx of cause is of course broad. See also Dr. Bishop Dublin note.  1. IVF: 1L LR bolus given in ED, now putting on NS at 125 cc/hr for the moment 2. Strict intake and output 3. Renal US = no obvious acute abnormality, obstruction, etc 4. Repeat BMP in AM 5. See hyperkalemia below

## 2021-08-02 NOTE — H&P (Addendum)
History and Physical    Patient: Kayla Berger LDJ:[id number] DOB: 04-25-1960 DOA: 08/02/2021 DOS: the patient was seen and examined on 08/02/2021 PCP: Janie Morning, DO  Patient coming from: Home  Chief Complaint:  Chief Complaint  Patient presents with   Emesis   HPI: Kayla Berger is a 61 y.o. female with medical history significant of DM2, HTN.  At baseline pt has normal renal fxn (Creat was 0.74 in Jan this year)  Pt recently started on ozempic a couple of months ago for DM2.  Dose recently increased x2 weeks ago per patient.  Prior to this dose increase she had been doing well.  After dose increase she began to have severe nausea.  She thinks she likely got dehydrated she states as she had persistent dry mouth.  ~3 days ago began to have onset of severe and intractable N/V.  Unable to keep anything down PO.  This has been persistent.  Today had multiple falls at home prompting her to come in to ED.  Pt admits to dry mouth, feeling thirsty.   Review of Systems: As mentioned in the history of present illness. All other systems reviewed and are negative. 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   Migraines    occ   PCOS (polycystic ovarian syndrome) 1986   PONV (postoperative nausea and vomiting)    Shoulder pain    Thyroid disease    Trochanteric bursitis of left hip    Vaginal cysts    4 have been removed   Past Surgical History:  Procedure Laterality Date   APPENDECTOMY  1967   CARDIAC CATHETERIZATION Bilateral    cataracts   CHOLECYSTECTOMY N/A 02/16/2021   Procedure: LAPAROSCOPIC CHOLECYSTECTOMY;  Surgeon: Dwan Bolt, MD;  Location: [facility];  Service: General;   Laterality: N/A;   KYPHOPLASTY N/A 10/31/2016   Procedure: KYPHOPLASTY LUMBAR ONE, LUMBAR TWO AND  LUMBAR THREE;  Surgeon: Melina Schools, MD;  Location: [facility];  Service: Orthopedics;  Laterality: N/A;   nsvd  2001   normal vaginal del   sonobysterogram, laporoscopy  1997   fertility test   Social History:  reports that she has never smoked. She has never used smokeless tobacco. She reports that she does not drink alcohol and does not use drugs.  Allergies  Allergen Reactions   Hydrocodone Other (See Comments)    Numbness and tingling    Family History  Problem Relation Age of Onset   Hypertension Mother    Diabetes Mother    Hypertension Father    Cancer Father    Hypertension Sister    Hypertension Maternal Grandmother    Heart attack Maternal Grandfather    Diabetes Maternal Grandfather    Diabetes Paternal Grandfather     Prior to Admission medications   Medication Sig Start Date End Date Taking? Authorizing Provider  Alpha Lipoic Acid 200 MG CAPS Take 200 mg by mouth daily.    [provider]  APPLE CIDER VINEGAR PO Take 450 mg by mouth daily.    [provider]  Ascorbic Acid (VITAMIN C) 1000 MG tablet  Take 1,000 mg by mouth daily.    [provider]  aspirin 81 MG chewable tablet Chew 81 mg by mouth daily.      [provider]  atorvastatin (LIPITOR) 10 MG tablet Take 10 mg by mouth every evening.    [provider]  Biotin 10000 MCG TABS Take 10,000 mcg by mouth daily.    [provider]  cetirizine (ZYRTEC) 10 MG tablet Take 10 mg by mouth daily.    [provider]  Cholecalciferol (VITAMIN D) 2000 units tablet Take 2,000 Units by mouth daily.    [provider]  CINNAMON PO Take 1,000 mg by mouth in the morning and at bedtime.    [provider]  CRANBERRY PO Take 1 capsule by mouth daily.    [provider]  diphenhydrAMINE (BENADRYL) 25 MG tablet Take 25 mg by mouth at  bedtime.    [provider]  DULoxetine (CYMBALTA) 60 MG capsule Take 60 mg by mouth 2 (two) times daily.    [provider]  gabapentin (NEURONTIN) 400 MG capsule Take 400 mg by mouth at bedtime.    [provider]  insulin glargine, 2 Unit Dial, (TOUJEO MAX SOLOSTAR) 300 UNIT/ML Solostar Pen Inject 48 Units into the skin at bedtime.    [provider]  insulin lispro (HUMALOG) 100 UNIT/ML injection Inject 4-12 Units into the skin 3 (three) times daily before meals.    [provider]  lisinopril-hydrochlorothiazide (ZESTORETIC) 10-12.5 MG tablet Take 1 tablet by mouth daily.    [provider]  metFORMIN (GLUCOPHAGE) 1000 MG tablet Take 1,000 mg by mouth 2 (two) times daily with a meal.    [provider]  ondansetron (ZOFRAN) 4 MG tablet Take 1 tablet (4 mg total) by mouth every 6 (six) hours as needed for nausea or vomiting. 02/16/21   Dwan Bolt, MD  Teriparatide, Recombinant, 620 MCG/2.48ML SOPN Inject 20 mcg into the skin at bedtime. 09/21/20   [provider]  zinc gluconate 50 MG tablet Take 50 mg by mouth daily.    [provider]    Physical Exam: Vitals:   08/02/21 1800 08/02/21 1830 08/02/21 1930 08/02/21 2000  BP: 106/69 95/67 127/80 124/79  Pulse: 100 (!) 107 (!) 108 (!) 106  Resp: (!) _0 Temp:      TempSrc:      SpO2: 98% 99% 97% 93%  Weight:      Height:       Constitutional: NAD, calm, comfortable Eyes: PERRL, lids and conjunctivae normal ENMT: Mucous membranes are moist. Posterior pharynx clear of any exudate or lesions.Normal dentition.  Neck: normal, supple, no masses, no thyromegaly Respiratory: clear to auscultation bilaterally, no wheezing, no crackles. Normal respiratory effort. No accessory muscle use.  Cardiovascular: Regular rate and rhythm, no murmurs / rubs / gallops. No extremity edema. 2+ pedal pulses. No carotid bruits.  Abdomen: no tenderness, no masses  palpated. No hepatosplenomegaly. Bowel sounds positive.  Musculoskeletal: no clubbing / cyanosis. No joint deformity upper and lower extremities. Good ROM, no contractures. Normal muscle tone.  Skin: no rashes, lesions, ulcers. No induration Neurologic: CN 2-12 grossly intact. Sensation intact, DTR normal. Strength 5/5 in all 4.  Psychiatric: Normal judgment and insight. Alert and oriented x 3. Normal mood.   Data Reviewed:       Latest Ref Rng & Units 08/02/2021    6:48 PM 08/02/2021    1:51 PM 02/13/2021  9:12 AM  BMP  Glucose 70 - 99 mg/dL  150  101   BUN 6 - 20 mg/dL  68  14   Creatinine 0.44 - 1.00 mg/dL  9.87  0.74   Sodium 135 - 145 mmol/L 128  140  141   Potassium 3.5 - 5.1 mmol/L 6.9  6.4  4.0   Chloride 98 - 111 mmol/L  76  105   CO2 22 - 32 mmol/L  36  24   Calcium 8.9 - 10.3 mg/dL  10.7  9.2    CBC    Component Value Date/Time   WBC 12.5 (H) 08/02/2021 1351   RBC 4.57 08/02/2021 1351   HGB 13.6 08/02/2021 1848   HCT 40.0 08/02/2021 1848   PLT 362 08/02/2021 1351   MCV 87.5 08/02/2021 1351   MCV 89.8 07/02/2012 1452   MCH 29.1 08/02/2021 1351   MCHC 33.3 08/02/2021 1351   RDW 13.5 08/02/2021 1351   LYMPHSABS 1.7 08/02/2021 1351   MONOABS 0.7 08/02/2021 1351   EOSABS 0.0 08/02/2021 1351   BASOSABS 0.0 08/02/2021 1351   Renal US = no acute abnormality  Phos = 8.0  Assessment and Plan: * Acute kidney failure (HCC) Possibly: increased ozempic dose 2 weeks ago causing nausea which resulted in decreased PO intake of fluids and causing AKF?  AKI due to pre-renal / dehydration is known black box warning on this drug class, especially when combined with other meds that can cause dehydration / AKI (IE the hydrochlorothiazide she takes for HTN).  -Vomiting for past ~3 days resulting in met alkalosis (loss of acids) DDx of cause is of course broad. See also Dr. Bishop Dublin note.  IVF: 1L LR bolus given in ED, now putting on NS at 125 cc/hr for the moment Strict intake  and output Renal US = no obvious acute abnormality, obstruction, etc Repeat BMP in AM See hyperkalemia below  Hyperkalemia, diminished renal excretion Pt does have EKG changes on initial EKG: specifically very tall peaked T waves which were actually being read by computer as extra QRS complexes These appear slightly improved on repeat EKG following temp measures done in ED.  In ED got: Insulin + dextrose IVF bolus lokelma Tele monitor Repeat K now and Q4H  Lactic acidosis Lactates of 4.1 and 6.6 on repeat. Pt actually alkalotic (not acidotic) on blood gas Suspect this stems primarily from dehydration.  Decreased O2 offloading from hemoglobin to tissues due to alkalosis also a possibility.  Normal saline IVF Serial lactates  DM2 (diabetes mellitus, type 2) (HCC) Hold metformin. Hold home lantus (hasnt taken in past couple of nights anyhow due to low BGLs)  Sensitive SSI Q4H for the moment  HTN (hypertension) Hold zestoretic in setting of AKF.      Advance Care Planning:   Code Status: Full Code   Consults: Nephrology  Family Communication: No family in room  Severity of Illness: The appropriate patient status for this patient is INPATIENT. Inpatient status is judged to be reasonable and necessary in order to provide the required intensity of service to ensure the patient's safety. The patient's presenting symptoms, physical exam findings, and initial radiographic and laboratory data in the context of their chronic comorbidities is felt to place them at high risk for further clinical deterioration. Furthermore, it is not anticipated that the patient will be medically stable for discharge from the hospital within 2 midnights of admission.   * I certify that at the point of admission it is  my clinical judgment that the patient will require inpatient hospital care spanning beyond 2 midnights from the point of admission due to high intensity of service, high risk for further  deterioration and high frequency of surveillance required.*  Author: Etta Quill., DO 08/02/2021 8:41 PM  For on call review [email or web address].

## 2021-08-02 NOTE — Progress Notes (Addendum)
[facility] Brief Note:  K rising despite fluid resuscitation, insulin/ dextrose, lokelma, albuterol Now is > 7.5 Have reordered shifting agents + Ca  Foley just placed--> has minimal amount of urine Will need to do dialysis.  Discussed with pt.  She is willing to proceed. I have contacted PCCM who has graciously agreed to help place nontunneled HD catheter- greatly appreciate assistance. Have written HD orders and informed HD staff personally.   She will likely need fluid bolus during HD as well.   Serologic workup ordered and pending.  Please call with any questions.  Bufford Buttner MD [facility] Pgr [phone]  ADDEND: lactate continues to trend up at 8.1--> raising possibility of metformin toxicity as well which is also treated with dialysis.

## 2021-08-02 NOTE — Assessment & Plan Note (Signed)
Hold zestoretic in setting of AKF.

## 2021-08-02 NOTE — ED Provider Notes (Signed)
Emergency Department Provider Note   I have reviewed the triage vital signs and the nursing notes.   HISTORY  Chief Complaint Emesis   HPI Kayla Berger is a 61 y.o. female with past history of insulin-dependent diabetes presents the emergency department with nausea and vomiting for the past 3 days.  Patient states that overall her appetite has been low since starting Ozempic 3 months ago but that her diabetic numbers and overall health have been improving since starting the medication.  She has not had any issues with vomiting until 3 days ago when she began to have more severe nausea and vomiting with attempting to eat or drink anything.  She is not having any abdominal, flank, back pain.  No chest discomfort.  No shortness of breath.  Her sister is at bedside and states that she has been more drowsy and has had several falls recently with some head trauma.  Patient states she has frequent urinary tract infections but her last episode was 6 weeks ago and improved with Cipro.  She does not take NSAIDs for her chronic back pain.  She does take a baby aspirin daily.  No prior history of kidney issues.   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   Migraines    occ   PCOS (polycystic ovarian syndrome) 1986   PONV (postoperative nausea and vomiting)    Shoulder pain    Thyroid disease    Trochanteric bursitis of left hip    Vaginal cysts    4 have been removed    Review of Systems  Constitutional: No fever/chills. Decreased oral intake and sleepiness.  Eyes: No visual changes. ENT: No sore throat. Cardiovascular: Denies chest pain. Respiratory: Denies shortness of  breath. Gastrointestinal: No abdominal pain. Positive nausea and vomiting.  No diarrhea.  No constipation. Genitourinary: Negative for dysuria. Musculoskeletal: Negative for back pain. Skin: Negative for rash. Neurological: Negative for headaches, focal weakness or numbness.   ____________________________________________   PHYSICAL EXAM:  VITAL SIGNS: ED Triage Vitals  Enc Vitals Group     BP 08/02/21 1336 110/67     Pulse Rate 08/02/21 1336 96     Resp 08/02/21 1336 16     Temp 08/02/21 1336 98.5 F (36.9 C)     Temp Source 08/02/21 1336 Oral     SpO2 08/02/21 1336 100 %     Weight 08/02/21 1400 164 lb (74.4 kg)     Height 08/02/21 1400 5\' 3"  (1.6 m)   Constitutional: Alert and oriented. Well appearing and in no acute distress. Eyes: Conjunctivae are normal.  Head: Atraumatic. Nose: No congestion/rhinnorhea. Mouth/Throat: Mucous membranes are dry.  Neck: No stridor.   Cardiovascular: Normal rate, regular rhythm. Good peripheral circulation. Grossly normal heart sounds.   Respiratory: Normal respiratory effort.  No retractions. Lungs CTAB. Gastrointestinal: Soft and nontender. No distention.  Musculoskeletal: No lower extremity tenderness nor edema. No gross deformities of extremities. Neurologic:  Normal speech and language. No gross focal neurologic deficits are appreciated.  Skin:  Skin is warm, dry and intact. No rash noted.  ____________________________________________   (  all labs ordered are listed, but only abnormal results are displayed)  Labs Reviewed  COMPREHENSIVE METABOLIC PANEL - Abnormal; Notable for the following components:      Result Value   Potassium 6.4 (*)    Chloride 76 (*)    CO2 36 (*)    Glucose, Bld 150 (*)    BUN 68 (*)    Creatinine, Ser 9.87 (*)    Calcium 10.7 (*)    GFR, Estimated 4 (*)    Anion gap 28 (*)    All other components within normal limits  CBC WITH DIFFERENTIAL/PLATELET - Abnormal; Notable for the following  components:   WBC 12.5 (*)    Neutro Abs 10.0 (*)    All other components within normal limits  URINALYSIS, ROUTINE W REFLEX MICROSCOPIC - Abnormal; Notable for the following components:   Ketones, ur 5 (*)    Protein, ur 100 (*)    Leukocytes,Ua SMALL (*)    WBC, UA >50 (*)    Bacteria, UA RARE (*)    All other components within normal limits  LIPASE, BLOOD  MAGNESIUM  PHOSPHORUS  BLOOD GAS, VENOUS  LACTIC ACID, PLASMA  LACTIC ACID, PLASMA   ____________________________________________  EKG   EKG Interpretation  Date/Time:  Thursday August 02 2021 13:49:11 EDT Ventricular Rate:  195 PR Interval:    QRS Duration: 70 QT Interval:  226 QTC Calculation: 407 R Axis:   77 Text Interpretation: Sinus tachycardia Low voltage QRS Borderline ECG When compared with ECG of 13-Feb-2021 08:50, PREVIOUS ECG IS PRESENT Confirmed by Alona Bene [phone]) on 08/02/2021 5:41:29 PM        ____________________________________________  RADIOLOGY  DG Chest 2 View  Result Date: 08/02/2021 CLINICAL DATA:  Reflux. EXAM: CHEST - 2 VIEW COMPARISON:  11/08/2009 FINDINGS: The heart size and mediastinal contours are within normal limits. Both lungs are clear. The visualized skeletal structures are unremarkable. IMPRESSION: No active cardiopulmonary disease. Electronically Signed   By: Marlan Palau M.D.   On: 08/02/2021 15:12    ____________________________________________   PROCEDURES  Procedure(s) performed:   .Critical Care  Performed by: Maia Plan, MD Authorized by: Maia Plan, MD   Critical care provider statement:    Critical care time (minutes):  35   Critical care time was exclusive of:  Separately billable procedures and treating other patients and teaching time   Critical care was necessary to treat or prevent imminent or life-threatening deterioration of the following conditions:  Metabolic crisis and dehydration   Critical care was time spent personally by me on the  following activities:  Development of treatment plan with patient or surrogate, discussions with consultants, evaluation of patient's response to treatment, examination of patient, ordering and review of laboratory studies, ordering and review of radiographic studies, ordering and performing treatments and interventions, pulse oximetry, re-evaluation of patient's condition and review of old charts   I assumed direction of critical care for this patient from another provider in my specialty: no     Care discussed with: admitting provider      ____________________________________________   INITIAL IMPRESSION / ASSESSMENT AND PLAN / ED COURSE  Pertinent labs & imaging results that were available during my care of the patient were reviewed by me and considered in my medical decision making (see chart for details).   This patient is Presenting for Evaluation of nausea/vomiting, which does require a range of treatment options, and is a complaint that involves a high risk of morbidity and  mortality.  The Differential Diagnoses includes but is not exclusive to acute cholecystitis, intrathoracic causes for epigastric abdominal pain, gastritis, duodenitis, pancreatitis, small bowel or large bowel obstruction, abdominal aortic aneurysm, hernia, gastritis, etc.   Critical Interventions-    Medications  lactated ringers bolus 1,000 mL (has no administration in time range)  lactated ringers infusion (has no administration in time range)  sodium zirconium cyclosilicate (LOKELMA) packet 10 g (has no administration in time range)  insulin aspart (novoLOG) injection 5 Units (has no administration in time range)    And  dextrose 50 % solution 50 mL (has no administration in time range)  dextrose 10 % infusion (has no administration in time range)    Reassessment after intervention:  symptoms improved   I did obtain Additional Historical Information from sister at bedside.  I decided to review  pertinent External Data, and in summary normal renal function on labs from January of this year.   Clinical Laboratory Tests Ordered, included patient's complete metabolic panel shows creatinine of 9.87 with BUN of 68.  CO2 36 and mild hyperkalemia at 6.4.  Patient with leukocytosis to 12.5 but no infection symptoms, question hemoconcentration.  Lipase normal.  Radiologic Tests Ordered, included CXR, CT head, and renal US. I independently interpreted the images and agree with radiology interpretation.   Cardiac Monitor Tracing which shows sinus tachycardia.   Social Determinants of Health Risk patient does not smoke or drink EtOH.   Consult complete with Nephrology, Dr. Signe Colt, who will consult.   Medical Decision Making: Summary:  Patient presents emergency department with nausea and vomiting with decreased p.o. intake over the past several weeks but vomiting only the past several days.  She has significantly elevated creatinine and electrolyte disturbance as described above.  Consulted with nephrology who will consult.  Patient's history seems most consistent with prerenal etiology of kidney failure.  No abdominal or flank pain.  Plan for renal ultrasound.  Patient also describing several falls recently some of which have resulted in head trauma.  Will obtain screening CT head without contrast but no focal deficits to suspect stroke.   Reevaluation with update and discussion with patient and family. Plan for admit.   Disposition: admit  ____________________________________________  FINAL CLINICAL IMPRESSION(S) / ED DIAGNOSES  Final diagnoses:  AKI (acute kidney injury) (HCC)  Hyperkalemia     Note:  This document was prepared using Dragon voice recognition software and may include unintentional dictation errors.  Alona Bene, MD, [facility]    Sheena Donegan, Arlyss Repress, MD 08/06/21 [phone]

## 2021-08-02 NOTE — Assessment & Plan Note (Addendum)
Pt does have EKG changes on initial EKG: specifically very tall peaked T waves which were actually being read by computer as extra QRS complexes These appear slightly improved on repeat EKG following temp measures done in ED.  1. In ED got: 1. Insulin + dextrose 2. IVF bolus 3. lokelma 2. Tele monitor 3. Repeat K now and Q4H

## 2021-08-03 ENCOUNTER — Inpatient Hospital Stay (HOSPITAL_COMMUNITY): Payer: 59

## 2021-08-03 DIAGNOSIS — E872 Acidosis, unspecified: Secondary | ICD-10-CM | POA: Diagnosis not present

## 2021-08-03 DIAGNOSIS — N179 Acute kidney failure, unspecified: Secondary | ICD-10-CM | POA: Diagnosis not present

## 2021-08-03 LAB — HEPATITIS B SURFACE ANTIBODY,QUALITATIVE: Hep B S Ab: NONREACTIVE

## 2021-08-03 LAB — GLUCOSE, CAPILLARY
Glucose-Capillary: 126 mg/dL — ABNORMAL HIGH (ref 70–99)
Glucose-Capillary: 135 mg/dL — ABNORMAL HIGH (ref 70–99)
Glucose-Capillary: 160 mg/dL — ABNORMAL HIGH (ref 70–99)
Glucose-Capillary: 178 mg/dL — ABNORMAL HIGH (ref 70–99)
Glucose-Capillary: 249 mg/dL — ABNORMAL HIGH (ref 70–99)
Glucose-Capillary: 295 mg/dL — ABNORMAL HIGH (ref 70–99)
Glucose-Capillary: 65 mg/dL — ABNORMAL LOW (ref 70–99)
Glucose-Capillary: 70 mg/dL (ref 70–99)
Glucose-Capillary: 93 mg/dL (ref 70–99)

## 2021-08-03 LAB — CBC
HCT: 33.6 % — ABNORMAL LOW (ref 36.0–46.0)
Hemoglobin: 11.2 g/dL — ABNORMAL LOW (ref 12.0–15.0)
MCH: 29.6 pg (ref 26.0–34.0)
MCHC: 33.3 g/dL (ref 30.0–36.0)
MCV: 88.7 fL (ref 80.0–100.0)
Platelets: 322 10*3/uL (ref 150–400)
RBC: 3.79 MIL/uL — ABNORMAL LOW (ref 3.87–5.11)
RDW: 13.7 % (ref 11.5–15.5)
WBC: 12 10*3/uL — ABNORMAL HIGH (ref 4.0–10.5)
nRBC: 0 % (ref 0.0–0.2)

## 2021-08-03 LAB — BASIC METABOLIC PANEL
Anion gap: 17 — ABNORMAL HIGH (ref 5–15)
BUN: 41 mg/dL — ABNORMAL HIGH (ref 6–20)
CO2: 34 mmol/L — ABNORMAL HIGH (ref 22–32)
Calcium: 8.8 mg/dL — ABNORMAL LOW (ref 8.9–10.3)
Chloride: 88 mmol/L — ABNORMAL LOW (ref 98–111)
Creatinine, Ser: 6.45 mg/dL — ABNORMAL HIGH (ref 0.44–1.00)
GFR, Estimated: 7 mL/min — ABNORMAL LOW (ref >60)
Glucose, Bld: 92 mg/dL (ref 70–99)
Potassium: 4 mmol/L (ref 3.5–5.1)
Sodium: 139 mmol/L (ref 135–145)

## 2021-08-03 LAB — HEPATITIS B CORE ANTIBODY, TOTAL: Hep B Core Total Ab: NONREACTIVE

## 2021-08-03 LAB — POTASSIUM
Potassium: 4.9 mmol/L (ref 3.5–5.1)
Potassium: 5.4 mmol/L — ABNORMAL HIGH (ref 3.5–5.1)
Potassium: 4.1 mmol/L (ref 3.5–5.1)

## 2021-08-03 LAB — LACTIC ACID, PLASMA
Lactic Acid, Venous: 7.2 mmol/L (ref 0.5–1.9)
Lactic Acid, Venous: 2 mmol/L (ref 0.5–1.9)

## 2021-08-03 LAB — MRSA NEXT GEN BY PCR, NASAL: MRSA by PCR Next Gen: NOT DETECTED

## 2021-08-03 LAB — HEPATITIS B SURFACE ANTIGEN: Hepatitis B Surface Ag: NONREACTIVE

## 2021-08-03 LAB — HEPATITIS C ANTIBODY: HCV Ab: NONREACTIVE

## 2021-08-03 MED ORDER — PHENYLEPHRINE HCL-NACL 20-0.9 MG/250ML-% IV SOLN: 0.0000 ug/min | INTRAVENOUS | Status: DC

## 2021-08-03 MED ORDER — PHENYLEPHRINE HCL-NACL 20-0.9 MG/250ML-% IV SOLN
INTRAVENOUS | Status: AC
  Administered 2021-08-03: 20 ug/min via INTRAVENOUS
  Filled 2021-08-03: qty 250

## 2021-08-03 MED ORDER — SODIUM CHLORIDE 0.9% FLUSH
10.0000 mL | Freq: Two times a day (BID) | INTRAVENOUS | Status: DC
  Administered 2021-08-04 – 2021-08-08 (×11): 10 mL

## 2021-08-03 MED ORDER — HEPARIN SODIUM (PORCINE) 5000 UNIT/ML IJ SOLN
5000.0000 [IU] | Freq: Three times a day (TID) | INTRAMUSCULAR | Status: DC
  Administered 2021-08-03 – 2021-08-08 (×15): 5000 [IU] via SUBCUTANEOUS
  Filled 2021-08-03 (×15): qty 1

## 2021-08-03 MED ORDER — LACTATED RINGERS IV SOLN: INTRAVENOUS | Status: DC

## 2021-08-03 MED ORDER — SODIUM ZIRCONIUM CYCLOSILICATE 10 G PO PACK
10.0000 g | PACK | Freq: Every day | ORAL | Status: DC
  Administered 2021-08-03 – 2021-08-05 (×3): 10 g via ORAL
  Filled 2021-08-03 (×3): qty 1

## 2021-08-03 MED ORDER — ORAL CARE MOUTH RINSE: 15.0000 mL | OROMUCOSAL | Status: DC | PRN

## 2021-08-03 MED ORDER — LACTATED RINGERS IV BOLUS
1000.0000 mL | Freq: Once | INTRAVENOUS | Status: AC
  Administered 2021-08-03: 1000 mL via INTRAVENOUS

## 2021-08-03 MED ORDER — SODIUM CHLORIDE 0.9% FLUSH: 10.0000 mL | INTRAVENOUS | Status: DC | PRN

## 2021-08-03 MED ORDER — METOPROLOL TARTRATE 5 MG/5ML IV SOLN
2.5000 mg | Freq: Once | INTRAVENOUS | Status: AC
  Administered 2021-08-03: 2.5 mg via INTRAVENOUS
  Filled 2021-08-03: qty 5

## 2021-08-03 MED ORDER — NOREPINEPHRINE 4 MG/250ML-% IV SOLN
INTRAVENOUS | Status: AC
  Filled 2021-08-03: qty 250

## 2021-08-03 MED ORDER — SODIUM CHLORIDE 0.9 % IV BOLUS
500.0000 mL | Freq: Once | INTRAVENOUS | Status: AC
  Administered 2021-08-03: 500 mL via INTRAVENOUS

## 2021-08-03 MED ORDER — GABAPENTIN 100 MG PO CAPS
200.0000 mg | ORAL_CAPSULE | Freq: Every day | ORAL | Status: DC
  Administered 2021-08-03 – 2021-08-07 (×5): 200 mg via ORAL
  Filled 2021-08-03 (×5): qty 2

## 2021-08-03 NOTE — Progress Notes (Signed)
Patient ID: Kayla Berger, female   DOB: [birth year], 61 y.o.   MRN: [id number]  [facility] Progress Note   Assessment/ Plan:   1. Acute kidney Injury: This appears to have been hemodynamically mediated from volume contraction in the setting of ongoing lisinopril/HCTZ use-possibly with evolution to mild ischemic ATN based on limited response to volume resuscitation.  Underwent emergent hemodialysis yesterday for management of hyperkalemia.  Labs reviewed from this morning and she does not have acute indication for additional dialysis today.  We will continue to follow closely for renal recovery versus need for additional hemodialysis. 2.  Hyperkalemia: Secondary to acute kidney injury and treated with hemodialysis after failure to improve with medical measures. 3.  Intractable nausea/vomiting symptomatically improving, may need to reassess safety of continue Ozempic. 4.  Metabolic alkalosis: Suspected to be secondary to volume contraction and preceding hydrochlorothiazide use, monitor with ongoing management/renal recovery. 5.  Hypotension: Suspected to be from significant intravascular volume contraction/hypovolemia.  On antimicrobial coverage with ceftriaxone and currently on pressor support with Neo-Synephrine.  Continue normal saline at 125 cc/h.  Subjective:   Reports to be feeling better but still having "the shakes".  Informs me that her sister who is a physician down in [place] is driving up to be at bedside.   Objective:   BP (!) 88/73   Pulse (!) 134   Temp 98.6 F (37 C) (Oral)   Resp 16   Ht 5\' 3"  (1.6 m)   Wt 76.8 kg   LMP 10/06/2012   SpO2 90%   BMI 29.99 kg/m   Intake/Output Summary (Last 24 hours) at 08/03/2021 0800 Last data filed at 08/03/2021 0745 Gross per 24 hour  Intake 2058.25 ml  Output 75 ml  Net 1983.25 ml   Weight change:   Physical Exam: Gen: Comfortably resting in bed, awake/alert CVS: Pulse regular tachycardia, S1 and S2 normal, no  murmurs Resp: Anteriorly clear to auscultation, no rales/rhonchi Abd: Soft, flat, nontender, bowel sounds normal Ext: No lower extremity edema  Imaging: DG CHEST PORT 1 VIEW  Result Date: 08/03/2021 CLINICAL DATA:  Status post central line placement EXAM: PORTABLE CHEST 1 VIEW COMPARISON:  08/02/2021 FINDINGS: Cardiac shadow is within normal limits. Right jugular temporary dialysis catheter is noted in satisfactory position. No pneumothorax is seen. The lungs are clear. No bony abnormality is noted. IMPRESSION: No pneumothorax following central line placement. Electronically Signed   By: 10/03/2021 M.D.   On: 08/03/2021 03:55   CT Head Wo Contrast  Result Date: 08/02/2021 CLINICAL DATA:  Head trauma EXAM: CT HEAD WITHOUT CONTRAST TECHNIQUE: Contiguous axial images were obtained from the base of the skull through the vertex without intravenous contrast. RADIATION DOSE REDUCTION: This exam was performed according to the departmental dose-optimization program which includes automated exposure control, adjustment of the mA and/or kV according to patient size and/or use of iterative reconstruction technique. COMPARISON:  None Available. FINDINGS: Brain: There is no mass, hemorrhage or extra-axial collection. The size and configuration of the ventricles and extra-axial CSF spaces are normal. The brain parenchyma is normal, without acute or chronic infarction. Vascular: No abnormal hyperdensity of the major intracranial arteries or dural venous sinuses. No intracranial atherosclerosis. Skull: The visualized skull base, calvarium and extracranial soft tissues are normal. Sinuses/Orbits: No fluid levels or advanced mucosal thickening of the visualized paranasal sinuses. No mastoid or middle ear effusion. The orbits are normal. IMPRESSION: Normal head CT. Electronically Signed   By: 10/03/2021.D.  On: 08/02/2021 19:14   US Renal  Result Date: 08/02/2021 CLINICAL DATA:  Kidney failure. EXAM: RENAL /  URINARY TRACT ULTRASOUND COMPLETE COMPARISON:  None Available. FINDINGS: Right Kidney: Renal measurements: 9.8 x 5.1 x 5.0 cm = volume: 131.9 mL. Normal renal cortical thickness and echogenicity without focal lesions or hydronephrosis. Left Kidney: Renal measurements: 10.3 x 5.1 x 4.6 cm = volume: 126.9 mL. Normal renal cortical thickness and echogenicity without focal lesions or hydronephrosis. Bladder: Appears normal for degree of bladder distention. Other: None. IMPRESSION: Normal renal ultrasound examination. Electronically Signed   By: Rudie Meyer M.D.   On: 08/02/2021 18:21   DG Chest 2 View  Result Date: 08/02/2021 CLINICAL DATA:  Reflux. EXAM: CHEST - 2 VIEW COMPARISON:  11/08/2009 FINDINGS: The heart size and mediastinal contours are within normal limits. Both lungs are clear. The visualized skeletal structures are unremarkable. IMPRESSION: No active cardiopulmonary disease. Electronically Signed   By: Marlan Palau M.D.   On: 08/02/2021 15:12    Labs: BMET Recent Labs  Lab 08/02/21 1351 08/02/21 1832 08/02/21 1848 08/02/21 2100 08/02/21 2247 08/03/21 0612  NA 140  --  128*  --  138 139  K 6.4*  --  6.9* >7.5* 5.8* 4.0  CL 76*  --   --   --  74* 88*  CO2 36*  --   --   --  33* 34*  GLUCOSE 150*  --   --   --  183* 92  BUN 68*  --   --   --  73* 41*  CREATININE 9.87*  --   --   --  10.23* 6.45*  CALCIUM 10.7*  --   --   --  9.7 8.8*  PHOS  --  8.0*  --   --  7.2*  --    CBC Recent Labs  Lab 08/02/21 1351 08/02/21 1848 08/03/21 0612  WBC 12.5*  --  12.0*  NEUTROABS 10.0*  --   --   HGB 13.3 13.6 11.2*  HCT 40.0 40.0 33.6*  MCV 87.5  --  88.7  PLT 362  --  322    Medications:     aspirin  81 mg Oral q AM   atorvastatin  10 mg Oral QHS   Chlorhexidine Gluconate Cloth  6 each Topical Q0600   DULoxetine  60 mg Oral BID   insulin aspart  0-9 Units Subcutaneous Q4H   sodium zirconium cyclosilicate  10 g Oral Daily   Zetta Bills, MD 08/03/2021, 8:00 AM

## 2021-08-03 NOTE — Progress Notes (Signed)
eLink Physician-Brief Progress Note Patient Name: Kayla Berger DOB: 23-Feb-1960 MRN: [id number]   Date of Service  08/03/2021  HPI/Events of Note  Hypotension - Patient tachycardic with HR in the 140's on arrival to ICU. Given Metoprolol 2.5 mg IV and BP dropped to 72/55 with MAP = 62. BP now = 89/57 with MAP = 68. With her history of severe intractable nausea and vomiting for 3 days prior to admission, she actually may be volume depleted which may account for her tachycardia and AKI.  eICU Interventions  Plan: Monitor CVP now and Q 4 hours. Further management based on CVP result.      Intervention Category Major Interventions: Hypotension - evaluation and management  Mayan Dolney Eugene 08/03/2021, 3:07 AM

## 2021-08-03 NOTE — Inpatient Diabetes Management (Addendum)
Inpatient Diabetes Program Recommendations  AACE/ADA: New Consensus Statement on Inpatient Glycemic Control (2015)  Target Ranges:  Prepandial:   less than 140 mg/dL      Peak postprandial:   less than 180 mg/dL (1-2 hours)      Critically ill patients:  140 - 180 mg/dL   Lab Results  Component Value Date   GLUCAP 70 08/03/2021   HGBA1C 7.1 (H) 02/13/2021    Review of Glycemic Control  Latest Reference Range & Units 08/03/21 00:57 08/03/21 01:43 08/03/21 03:49 08/03/21 07:56  Glucose-Capillary 70 - 99 mg/dL 295 (H) 188 (H) 416 (H) 70  (H): Data is abnormally high Diabetes history: Type 2 DM Outpatient Diabetes medications: Humalog 4-12 units TID, Toujeo 28 units QHS, Metformin 1000 mg BID, Ozempic 0.25 mg Qwk (dose recently increased) Current orders for Inpatient glycemic control: Novolog 0-9 units Q4H  Inpatient Diabetes Program Recommendations:    Consider adding A1C  Noted patient recently increased dose of Ozempic per MD notes. Based off clinical picture, would discontinue and have patient follow up with Dr Talmage Nap, outpatient endocrinology.   Addendum: Spoke with patient regarding outpatient diabetes management. Is followed by Dr Talmage Nap. Verified patient recently increased her Ozempic dose to 0.5 mg qwk for weight loss management. Reports her blood sugars have been fabulous and her insulin doses were needing decreasing.  Reviewed side effects of Ozempic and recommendation above. Patient plans to follow up with Dr Talmage Nap and reach out with insulin changes.  Thanks, Lujean Rave, MSN, RNC-OB Diabetes Coordinator [phone] (8a-5p)

## 2021-08-03 NOTE — Procedures (Signed)
Central Venous Catheter Insertion Procedure Note  SHETARA LAUNER  [id number]  02-24-1960  Date:08/03/21  Time:2:32 AM   Provider Performing:Gabriana Wilmott A Jessie Schrieber   Procedure: Insertion of Non-tunneled Central Venous Catheter(36556)with US guidance (93810)    Indication(s) Hemodialysis  Consent Risks of the procedure as well as the alternatives and risks of each were explained to the patient and/or caregiver.  Consent for the procedure was obtained and is signed in the bedside chart  Anesthesia Topical only with 1% lidocaine   Timeout Verified patient identification, verified procedure, site/side was marked, verified correct patient position, special equipment/implants available, medications/allergies/relevant history reviewed, required imaging and test results available.  Sterile Technique Maximal sterile technique including full sterile barrier drape, hand hygiene, sterile gown, sterile gloves, mask, hair covering, sterile ultrasound probe cover (if used).  Procedure Description Area of catheter insertion was cleaned with chlorhexidine and draped in sterile fashion.   With real-time ultrasound guidance a HD catheter was placed into the right internal jugular vein.  Nonpulsatile blood flow and easy flushing noted in all ports.  The catheter was sutured in place and sterile dressing applied.  Complications/Tolerance None; patient tolerated the procedure well. Chest X-ray is ordered to verify placement for internal jugular or subclavian cannulation.  Chest x-ray is not ordered for femoral cannulation.  EBL Minimal  Specimen(s) None

## 2021-08-03 NOTE — Progress Notes (Signed)
eLink Physician-Brief Progress Note Patient Name: Kayla Berger DOB: 1961-01-16 MRN: [id number]   Date of Service  08/03/2021  HPI/Events of Note  Last K+ = 5.4 which was treated with Lokelma.   eICU Interventions  Plan: K+ level STAT. BMP in AM.     Intervention Category Major Interventions: Electrolyte abnormality - evaluation and management  Berthold Glace Eugene 08/03/2021, 11:19 PM

## 2021-08-03 NOTE — TOC Initial Note (Signed)
Transition of Care [facility]) - Initial/Assessment Note    Patient Details  Name: Kayla Berger MRN: [id number] Date of Birth: 1960/11/05  Transition of Care [facility]) CM/SW Contact:    Tom-Johnson, Hershal Coria, RN Phone Number: 08/03/2021, 1:06 PM  Clinical Narrative:                  CM spoke with patient at bedside about needs for post hospital transition. Admitted for AKF. Currently on IV abx for UTI.  From home with sister, Tresa Endo. Has one daughter that lives in [place] and three supportive sisters. States she started a new medication, Ozempic for her Diabetes and then started experiencing symptoms after a dose increase that brought her to the ED. Currently employed at [employer]. Independent with care and drive self prior to admission. Has a cane, walker, wheelchair, raised toilet seat and Dexcom reader at home.  Had first dialysis last night for refractory Hyperkalemia . States she might not need outpatient dialysis at discharge.  PCP is Irena Reichmann, DO and uses [facility] in [place]. No TOC needs or recommendations noted at this time. CM will continue to follow with needs.    Barriers to Discharge: Continued Medical Work up   Patient Goals and CMS Choice Patient states their goals for this hospitalization and ongoing recovery are:: To return home CMS Medicare.gov Compare Post Acute Care list provided to:: Patient    Expected Discharge Plan and Services     Discharge Planning Services: CM Consult   Living arrangements for the past 2 months: Single Family Home                                      Prior Living Arrangements/Services Living arrangements for the past 2 months: Single Family Home Lives with:: Siblings (Sister, Tresa Endo.) Patient language and need for interpreter reviewed:: Yes Do you feel safe going back to the place where you live?: Yes      Need for Family Participation in Patient Care: Yes (Comment) Care giver support system in place?: Yes  (comment)   Criminal Activity/Legal Involvement Pertinent to Current Situation/Hospitalization: No - Comment as needed  Activities of Daily Living Home Assistive Devices/Equipment: None ADL Screening (condition at time of admission) Patient's cognitive ability adequate to safely complete daily activities?: Yes Is the patient deaf or have difficulty hearing?: No Does the patient have difficulty seeing, even when wearing glasses/contacts?: No Does the patient have difficulty concentrating, remembering, or making decisions?: No Patient able to express need for assistance with ADLs?: Yes Does the patient have difficulty dressing or bathing?: No Independently performs ADLs?: Yes (appropriate for developmental age) Does the patient have difficulty walking or climbing stairs?: No Weakness of Legs: Both Weakness of Arms/Hands: Both  Permission Sought/Granted Permission sought to share information with : Case Manager, Family Supports Permission granted to share information with : Yes, Verbal Permission Granted              Emotional Assessment Appearance:: Appears stated age Attitude/Demeanor/Rapport: Engaged, Gracious Affect (typically observed): Accepting, Appropriate, Calm, Hopeful Orientation: : Oriented to Self, Oriented to Place, Oriented to  Time, Oriented to Situation Alcohol / Substance Use: Not Applicable Psych Involvement: No (comment)  Admission diagnosis:  Hyperkalemia [E87.5] AKI (acute kidney injury) (HCC) [N17.9] Acute kidney failure (HCC) [N17.9] Patient Active Problem List   Diagnosis Date Noted   Acute kidney failure (HCC) 08/02/2021   Hyperkalemia, diminished  renal excretion 08/02/2021   Lactic acidosis 08/02/2021   Closed compression fracture of L2 lumbar vertebra 10/31/2016   Hyperlipidemia 09/23/2012   Hypothyroid 09/23/2012   Allergic rhinitis 09/23/2012   History of migraine headaches 09/23/2012   DM2 (diabetes mellitus, type 2) (HCC) 06/26/2012   HTN  (hypertension) 06/26/2012   PCP:  Irena Reichmann, DO Pharmacy:   [facility] [address] - [place], [place] - [address] [place] [address] Phone: [phone] Fax: [phone]  [facility] [address], [place] - [address] [place] [address] Phone: [phone] Fax: [phone]     Social Determinants of Health (SDOH) Interventions    Readmission Risk Interventions     No data to display

## 2021-08-03 NOTE — Progress Notes (Addendum)
eLink Physician-Brief Progress Note Patient Name: Kayla Berger DOB: 1960-05-28 MRN: [id number]   Date of Service  08/03/2021  HPI/Events of Note  Hypotension - BP now back down to 67/49 with MAP = 56. CVP = 8.   eICU Interventions  Plan: Bolus with 0.9 NaCl 500 mL IV over 30 minutes. Phenylephrine IV infusion. Titrate to MAP >= 65. Repeat CVP post fluid challenge.       Intervention Category Major Interventions: Other:  Judit Awad Dennard Nip 08/03/2021, 3:18 AM

## 2021-08-03 NOTE — Progress Notes (Signed)
Received patient in bed, alert and oriented. Informed consent signed and in chart.  Time tx completed:0730  HD treatment completed. Patient tolerated well. HD catheter without signs and symptoms of complications. Pt is in ICU bed. Aler, oriented, and in no acute distress. Report given to Loreta Ave, RN.  Total UF removed: o ml  Medication given: none  Post HD VS:  Post HD weight: 77.3 kg

## 2021-08-03 NOTE — Progress Notes (Addendum)
NAME:  Kayla Berger, MRN:  [id number], DOB:  1960/06/27, LOS: 1 ADMISSION DATE:  08/02/2021, CONSULTATION DATE: 08/02/2021 REFERRING MD: Dr. Signe Colt, CHIEF COMPLAINT: Need for dialysis access  History of Present Illness:  Kayla Berger is a 61 y.o. female with a past medical history significant for insulin-dependent type 2 diabetes, hypertension, hypothyroidism, GERD, and PCOS who presented to the emergency department with complaints of intractable nausea and vomiting.  Patient was recently started on Ozempic a couple months ago for treatment of type 2 diabetes with dose adjustment 2 weeks prior to admission.  Post increased dose adjustment patient began suffering from severe nausea and later vomiting.  She feels as if she was dehydrated on admission and has suffered multiple falls at home prior to admission.  On ED arrival patient was seen mildly tachycardic and hypertensive with all other vital signs within normal limits.  Lab work significant for hyperkalemia, potassium 6.4, creatinine 9.87, BUN 68, GFR 4, chloride 76, CO2 36 with lactic acid initially 4.1 later bumped to 6.6.  Patient was admitted per hospitalist service in the setting of acute renal failure and nephrology was consulted  PCCM consulted overnight due to refractory hyperkalemia and need for initiation of dialysis.  Pertinent  Medical History   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   Migraines    occ   PCOS (polycystic ovarian syndrome) 1986   PONV (postoperative nausea and vomiting)    Shoulder pain    Thyroid disease    Trochanteric bursitis of left hip    Vaginal cysts    4 have been removed    Significant Hospital  Events: Including procedures, antibiotic start and stop dates in addition to other pertinent events   08/02/2021 admitted for intractable nausea and vomiting resulting in acute renal failure  7/7 No issues overnight, tolerated iHD overnight due to refractory hyperkalemia   Interim History / Subjective:  States she feels well this am with complaints of thirst   Objective   Blood pressure (!) 88/73, pulse (!) 134, temperature 98.6 F (37 C), temperature source Oral, resp. rate 16, height 5\' 3"  (1.6 m), weight 76.8 kg, last menstrual period 10/06/2012, SpO2 90 %. CVP:  [8 mmHg] 8 mmHg      Intake/Output Summary (Last 24 hours) at 08/03/2021 0854 Last data filed at 08/03/2021 0745 Gross per 24 hour  Intake 2058.25 ml  Output 75 ml  Net 1983.25 ml    Filed Weights   08/02/21 1400 08/02/21 2236  Weight: 74.4 kg 76.8 kg    Examination: General: Acute on chronically ill appearing middle aged female lying in bed in NAD HEENT: [place]/AT, MM pink/moist, PERRL,  Neuro: Alert and oriented x3 CV: s1s2 regular rate and rhythm, no murmur, rubs, or gallops,  PULM:  Clear to ascultation, no increased work of breathing, no added breath sounds  GI: soft, bowel sounds active in all 4 quadrants, non-tender, non-distended Extremities: warm/dry, no edema  Skin: no rashes or lesions  Resolved Hospital Problem list     Assessment & Plan:  Acute renal failure likely secondary to severe dehydration in  the setting of intractable nausea vomiting with ACE/thiazide patient -Creatinine on admission 9.87 with BUN 68, creatinine 0.74 with BUN of 10 February 2021 Hyperkalemia P: Nephrology consulted on admission First round of HD provided overnight due to refractory hyperkalemia Follow renal function  Monitor urine output Trend Bmet Avoid nephrotoxins Ensure adequate renal perfusion  IV hydration  UTI -Patient reports dysuria and frequency prior to admit. UA with rare bacteria and small leukocytes. UA  pending  P: 3 days of IV Ceftriaxone  Follow culture   Lactic acidosis -Secondary to above P: Trend lactic Supportive care  History of type 2 diabetes -Home medication includes Toujeo, metformin, and Ozempic P: Home medications SSI CBG goal 140-180  Hx of Hypertension/lipidemia -Home medications include aspirin, Lipitor, lisinopril/HCTZ P: Continue home aspirin and statin Hold home ACE inhibitor/HCTZ Continuous telemetry As needed IV antihypertensives  Best Practice (right click and "Reselect all SmartList Selections" daily)   Diet/type: Regular consistency (see orders) DVT prophylaxis: SCD GI prophylaxis: N/A Lines: N/A Foley:  Yes, and it is still needed Code Status:  full code Last date of multidisciplinary goals of care discussion [pending]   Raynell Scott D. Tiburcio Pea, NP-C [facility] Personal contact information can be found on Amion  08/03/2021, 9:05 AM

## 2021-08-04 DIAGNOSIS — E119 Type 2 diabetes mellitus without complications: Secondary | ICD-10-CM | POA: Diagnosis not present

## 2021-08-04 DIAGNOSIS — N179 Acute kidney failure, unspecified: Secondary | ICD-10-CM | POA: Diagnosis not present

## 2021-08-04 DIAGNOSIS — I10 Essential (primary) hypertension: Secondary | ICD-10-CM | POA: Diagnosis not present

## 2021-08-04 DIAGNOSIS — E875 Hyperkalemia: Secondary | ICD-10-CM | POA: Diagnosis not present

## 2021-08-04 LAB — BASIC METABOLIC PANEL
Anion gap: 11 (ref 5–15)
Anion gap: 17 — ABNORMAL HIGH (ref 5–15)
BUN: 31 mg/dL — ABNORMAL HIGH (ref 6–20)
BUN: 34 mg/dL — ABNORMAL HIGH (ref 6–20)
CO2: 32 mmol/L (ref 22–32)
CO2: 25 mmol/L (ref 22–32)
Calcium: 8 mg/dL — ABNORMAL LOW (ref 8.9–10.3)
Calcium: 7.7 mg/dL — ABNORMAL LOW (ref 8.9–10.3)
Chloride: 93 mmol/L — ABNORMAL LOW (ref 98–111)
Chloride: 89 mmol/L — ABNORMAL LOW (ref 98–111)
Creatinine, Ser: 6.97 mg/dL — ABNORMAL HIGH (ref 0.44–1.00)
Creatinine, Ser: 6.97 mg/dL — ABNORMAL HIGH (ref 0.44–1.00)
GFR, Estimated: 6 mL/min — ABNORMAL LOW (ref >60)
GFR, Estimated: 6 mL/min — ABNORMAL LOW (ref >60)
Glucose, Bld: 101 mg/dL — ABNORMAL HIGH (ref 70–99)
Glucose, Bld: 159 mg/dL — ABNORMAL HIGH (ref 70–99)
Potassium: 4 mmol/L (ref 3.5–5.1)
Potassium: 4.3 mmol/L (ref 3.5–5.1)
Sodium: 136 mmol/L (ref 135–145)
Sodium: 131 mmol/L — ABNORMAL LOW (ref 135–145)

## 2021-08-04 LAB — GLUCOSE, CAPILLARY
Glucose-Capillary: 109 mg/dL — ABNORMAL HIGH (ref 70–99)
Glucose-Capillary: 147 mg/dL — ABNORMAL HIGH (ref 70–99)
Glucose-Capillary: 152 mg/dL — ABNORMAL HIGH (ref 70–99)
Glucose-Capillary: 152 mg/dL — ABNORMAL HIGH (ref 70–99)
Glucose-Capillary: 152 mg/dL — ABNORMAL HIGH (ref 70–99)
Glucose-Capillary: 174 mg/dL — ABNORMAL HIGH (ref 70–99)
Glucose-Capillary: 96 mg/dL (ref 70–99)

## 2021-08-04 LAB — C4 COMPLEMENT: Complement C4, Body Fluid: 32 mg/dL (ref 12–38)

## 2021-08-04 LAB — HEMOGLOBIN A1C
Hgb A1c MFr Bld: 6.6 % — ABNORMAL HIGH (ref 4.8–5.6)
Mean Plasma Glucose: 142.72 mg/dL

## 2021-08-04 LAB — HEPATITIS B SURFACE ANTIBODY, QUANTITATIVE: Hep B S AB Quant (Post): <3.1 m[IU]/mL — ABNORMAL LOW (ref >9.9)

## 2021-08-04 LAB — C3 COMPLEMENT: C3 Complement: 126 mg/dL (ref 82–167)

## 2021-08-04 LAB — ANA W/REFLEX IF POSITIVE: Anti Nuclear Antibody (ANA): NEGATIVE

## 2021-08-04 MED ORDER — SUCRALFATE 1 GM/10ML PO SUSP
1.0000 g | Freq: Three times a day (TID) | ORAL | Status: AC
  Administered 2021-08-04 – 2021-08-05 (×5): 1 g via ORAL
  Filled 2021-08-04 (×8): qty 10

## 2021-08-04 MED ORDER — SODIUM CHLORIDE 0.9 % IV SOLN: INTRAVENOUS | Status: DC | PRN

## 2021-08-04 NOTE — Progress Notes (Signed)
Patient ID: Kayla Berger, female   DOB: 1960-08-31, 61 y.o.   MRN: [id number] [facility] Progress Note   Assessment/ Plan:   1. Acute kidney Injury: This appears to have been hemodynamically mediated from volume contraction in the setting of ongoing lisinopril/HCTZ use-possibly with evolution to mild ischemic ATN based on limited response to volume resuscitation.  Output improving and without any acute indication for dialysis at this time.  I will discontinue her IV fluids at this time and monitor urine output/labs for renal recovery. 2.  Hyperkalemia: Secondary to acute kidney injury and treated with hemodialysis after failure to improve with medical measures.  Slight rise overnight treated successfully with Lokelma. 3.  Intractable nausea/vomiting symptomatically improving, may need to reassess safety of continue Ozempic. 4.  Metabolic alkalosis: Suspected to be secondary to volume contraction and preceding hydrochlorothiazide use, monitor with ongoing management/renal recovery. 5.  Hypotension: Blood pressure back to normal range-hypotension was suspected to be from significant intravascular volume contraction/hypovolemia.  On antimicrobial coverage with ceftriaxone and weaned off of pressor.  Discontinue IV fluids  Subjective:   Reports to be feeling fatigued and still trying to recover from recent events/hospitalization.  Daughter concerned that she is having some odynophagia/GERD symptoms.   Objective:   BP 133/82 (BP Location: Left Arm)   Pulse 86   Temp 97.7 F (36.5 C) (Oral)   Resp 18   Ht 5\' 3"  (1.6 m)   Wt 76.8 kg   LMP 10/06/2012   SpO2 96%   BMI 29.99 kg/m   Intake/Output Summary (Last 24 hours) at 08/04/2021 0841 Last data filed at 08/04/2021 0400 Gross per 24 hour  Intake 3665.34 ml  Output 1250 ml  Net 2415.34 ml   Weight change:   Physical Exam: Gen: Sitting up comfortably on the side of her bed, eating breakfast.  Sister at bedside. CVS: Pulse  regular tachycardia, S1 and S2 normal, no murmurs Resp: Anteriorly clear to auscultation, no rales/rhonchi Abd: Soft, flat, nontender, bowel sounds normal Ext: No lower extremity edema  Imaging: DG CHEST PORT 1 VIEW  Result Date: 08/03/2021 CLINICAL DATA:  Status post central line placement EXAM: PORTABLE CHEST 1 VIEW COMPARISON:  08/02/2021 FINDINGS: Cardiac shadow is within normal limits. Right jugular temporary dialysis catheter is noted in satisfactory position. No pneumothorax is seen. The lungs are clear. No bony abnormality is noted. IMPRESSION: No pneumothorax following central line placement. Electronically Signed   By: 10/03/2021 M.D.   On: 08/03/2021 03:55   CT Head Wo Contrast  Result Date: 08/02/2021 CLINICAL DATA:  Head trauma EXAM: CT HEAD WITHOUT CONTRAST TECHNIQUE: Contiguous axial images were obtained from the base of the skull through the vertex without intravenous contrast. RADIATION DOSE REDUCTION: This exam was performed according to the departmental dose-optimization program which includes automated exposure control, adjustment of the mA and/or kV according to patient size and/or use of iterative reconstruction technique. COMPARISON:  None Available. FINDINGS: Brain: There is no mass, hemorrhage or extra-axial collection. The size and configuration of the ventricles and extra-axial CSF spaces are normal. The brain parenchyma is normal, without acute or chronic infarction. Vascular: No abnormal hyperdensity of the major intracranial arteries or dural venous sinuses. No intracranial atherosclerosis. Skull: The visualized skull base, calvarium and extracranial soft tissues are normal. Sinuses/Orbits: No fluid levels or advanced mucosal thickening of the visualized paranasal sinuses. No mastoid or middle ear effusion. The orbits are normal. IMPRESSION: Normal head CT. Electronically Signed   By: 10/03/2021  M.D.   On: 08/02/2021 19:14   US Renal  Result Date: 08/02/2021 CLINICAL  DATA:  Kidney failure. EXAM: RENAL / URINARY TRACT ULTRASOUND COMPLETE COMPARISON:  None Available. FINDINGS: Right Kidney: Renal measurements: 9.8 x 5.1 x 5.0 cm = volume: 131.9 mL. Normal renal cortical thickness and echogenicity without focal lesions or hydronephrosis. Left Kidney: Renal measurements: 10.3 x 5.1 x 4.6 cm = volume: 126.9 mL. Normal renal cortical thickness and echogenicity without focal lesions or hydronephrosis. Bladder: Appears normal for degree of bladder distention. Other: None. IMPRESSION: Normal renal ultrasound examination. Electronically Signed   By: Rudie Meyer M.D.   On: 08/02/2021 18:21   DG Chest 2 View  Result Date: 08/02/2021 CLINICAL DATA:  Reflux. EXAM: CHEST - 2 VIEW COMPARISON:  11/08/2009 FINDINGS: The heart size and mediastinal contours are within normal limits. Both lungs are clear. The visualized skeletal structures are unremarkable. IMPRESSION: No active cardiopulmonary disease. Electronically Signed   By: Marlan Palau M.D.   On: 08/02/2021 15:12    Labs: BMET Recent Labs  Lab 08/02/21 1351 08/02/21 1832 08/02/21 1848 08/02/21 2100 08/02/21 2247 08/03/21 0612 08/03/21 1207 08/03/21 1547 08/03/21 2320 08/04/21 0415  NA 140  --  128*  --  138 139  --   --   --  136  K 6.4*  --  6.9* >7.5* 5.8* 4.0 4.9 5.4* 4.1 4.0  CL 76*  --   --   --  74* 88*  --   --   --  93*  CO2 36*  --   --   --  33* 34*  --   --   --  32  GLUCOSE 150*  --   --   --  183* 92  --   --   --  101*  BUN 68*  --   --   --  73* 41*  --   --   --  31*  CREATININE 9.87*  --   --   --  10.23* 6.45*  --   --   --  6.97*  CALCIUM 10.7*  --   --   --  9.7 8.8*  --   --   --  8.0*  PHOS  --  8.0*  --   --  7.2*  --   --   --   --   --    CBC Recent Labs  Lab 08/02/21 1351 08/02/21 1848 08/03/21 0612  WBC 12.5*  --  12.0*  NEUTROABS 10.0*  --   --   HGB 13.3 13.6 11.2*  HCT 40.0 40.0 33.6*  MCV 87.5  --  88.7  PLT 362  --  322    Medications:     aspirin  81 mg Oral q  AM   atorvastatin  10 mg Oral QHS   Chlorhexidine Gluconate Cloth  6 each Topical Q0600   DULoxetine  60 mg Oral BID   gabapentin  200 mg Oral QHS   heparin injection (subcutaneous)  5,000 Units Subcutaneous Q8H   insulin aspart  0-9 Units Subcutaneous Q4H   sodium chloride flush  10-40 mL Intracatheter Q12H   sodium zirconium cyclosilicate  10 g Oral Daily   Zetta Bills, MD 08/04/2021, 8:41 AM

## 2021-08-04 NOTE — Plan of Care (Signed)
  Problem: Coping: Goal: Ability to adjust to condition or change in health will improve Outcome: Progressing   Problem: Skin Integrity: Goal: Risk for impaired skin integrity will decrease Outcome: Progressing   Problem: Education: Goal: Knowledge of General Education information will improve Description: Including pain rating scale, medication(s)/side effects and non-pharmacologic comfort measures Outcome: Progressing   Problem: Clinical Measurements: Goal: Will remain free from infection Outcome: Progressing

## 2021-08-04 NOTE — Progress Notes (Addendum)
Progress Note  Patient: Kayla Berger [id number]:[id number] DOB: 22-Aug-1960  DOA: 08/02/2021  DOS: 08/04/2021    Brief hospital course: EZMA REHM is a 61 y.o. female with a history of longstanding IDT2DM, HTN who presented with intractable nausea, vomiting, admitted for dehydration with hypotension consistent with hypovolemic shock and hyperkalemic renal failure requiring urgent dialysis.   Assessment and Plan: Acute renal failure: Primarily hemodynamically mediated from profound dehydration from vomiting in setting of diuretic and ACE inhibitor. s/p urgent HD 7/7. Showing evidence of UOP picking up and potassium stable with lokelma, so no repeat dialysis currently planned. Creatinine yet to improve.  - Appreciate nephrology assistance. Pt tolerating po, CVP is 8, so IV fluids were stopped. We will repeat metabolic panel serially to inform need for additional interventions.   Intractable nausea, vomiting with contraction metabolic alkalosis: Primary suspect is ozempic as her dose increase from 0.25mg  to 0.50 mg coincides with symptoms. Last dose 7/4, so still likely having some effect. - Support with antiemetics. Low threshold to restart fluids if not taking adequate po.   Odynophagia, dysphagia, globus sensation: In setting of GERD symptoms chronically worsened with ozempic.  - No work up yet begun for this, will start with esophagram.   IDT2DM: Recently significantly improved control with declining insulin requirements due to ozempic and its commensurate weight loss.  - Continue sensitive SSI as she's at inpatient goal on this. Note this is significantly reduced from prior home regimens. - Follow up with Dr. Talmage Nap.  - Hold ozempic and metformin  Lactic acidemia: Lactic acid 8.1, metformin effect in addition to global hypoperfusion due to dehydration.  - Avoid metformin  UTI with leukocytosis: Not suspected to be etiology of hypotension.  - Continue ceftriaxone empirically. Urine culture  pending, afebrile.  Hyperkalemia:  - Continuing lokelma for now, monitoring as above  Hypovolemic shock: Resolved.   HLD:  - Continue statin  HTN:  - Hold ACE/HCTZ  Atelectasis:  - Incentive spirometry, OOB  Subjective: No further vomiting since one episode in ED, feeling better, restless. Sister who is an intensivist in [place], at bedside.  Objective: Vitals:   08/04/21 1100 08/04/21 1200 08/04/21 1300 08/04/21 1500  BP: 133/87 140/88 135/83 136/87  Pulse: 83 82 87 86  Resp: 13 13 17 16   Temp:    97.9 F (36.6 C)  TempSrc:    Oral  SpO2: 95% 97% 99% 100%  Weight:      Height:       Gen: 61 y.o. female in no distress Pulm: Nonlabored breathing room air. Crackles at bases that clear with cough. CV: Regular rate and rhythm. No murmur, rub, or gallop. No JVD, no dependent edema. GI: Abdomen soft, non-tender, non-distended, with normoactive bowel sounds.  Ext: Warm, no deformities Skin: No rashes, lesions or ulcers on visualized skin. Right IJ HD cath c/d/i Neuro: Alert and oriented. No focal neurological deficits. Psych: Judgement and insight appear fair. Mood euthymic & affect congruent. Behavior is appropriate.    Data Personally reviewed: CBC: Recent Labs  Lab 08/02/21 1351 08/02/21 1848 08/03/21 0612  WBC 12.5*  --  12.0*  NEUTROABS 10.0*  --   --   HGB 13.3 13.6 11.2*  HCT 40.0 40.0 33.6*  MCV 87.5  --  88.7  PLT 362  --  322   Basic Metabolic Panel: Recent Labs  Lab 08/02/21 1351 08/02/21 1832 08/02/21 1848 08/02/21 2100 08/02/21 2247 08/03/21 0612 08/03/21 1207 08/03/21 1547 08/03/21 2320 08/04/21 0415  NA  140  --  128*  --  138 139  --   --   --  136  K 6.4*  --  6.9*   < > 5.8* 4.0 4.9 5.4* 4.1 4.0  CL 76*  --   --   --  74* 88*  --   --   --  93*  CO2 36*  --   --   --  33* 34*  --   --   --  32  GLUCOSE 150*  --   --   --  183* 92  --   --   --  101*  BUN 68*  --   --   --  73* 41*  --   --   --  31*  CREATININE 9.87*  --   --    --  10.23* 6.45*  --   --   --  6.97*  CALCIUM 10.7*  --   --   --  9.7 8.8*  --   --   --  8.0*  MG  --  2.7*  --   --   --   --   --   --   --   --   PHOS  --  8.0*  --   --  7.2*  --   --   --   --   --    < > = values in this interval not displayed.   GFR: Estimated Creatinine Clearance: 8.4 mL/min (A) (by C-G formula based on SCr of 6.97 mg/dL (H)). Liver Function Tests: Recent Labs  Lab 08/02/21 1351 08/02/21 2247  AST 21  --   ALT 24  --   ALKPHOS 73  --   BILITOT 1.0  --   PROT 7.1  --   ALBUMIN 4.1 3.5   Recent Labs  Lab 08/02/21 1351  LIPASE 45   No results for input(s): "AMMONIA" in the last 168 hours. Coagulation Profile: No results for input(s): "INR", "PROTIME" in the last 168 hours. Cardiac Enzymes: No results for input(s): "CKTOTAL", "CKMB", "CKMBINDEX", "TROPONINI" in the last 168 hours. BNP (last 3 results) No results for input(s): "PROBNP" in the last 8760 hours. HbA1C: Recent Labs    08/04/21 0415  HGBA1C 6.6*   CBG: Recent Labs  Lab 08/03/21 1933 08/03/21 2316 08/04/21 0332 08/04/21 0806 08/04/21 1134  GLUCAP 160* 126* 96 109* 152*   Lipid Profile: No results for input(s): "CHOL", "HDL", "LDLCALC", "TRIG", "CHOLHDL", "LDLDIRECT" in the last 72 hours. Thyroid Function Tests: No results for input(s): "TSH", "T4TOTAL", "FREET4", "T3FREE", "THYROIDAB" in the last 72 hours. Anemia Panel: No results for input(s): "VITAMINB12", "FOLATE", "FERRITIN", "TIBC", "IRON", "RETICCTPCT" in the last 72 hours. Urine analysis:    Component Value Date/Time   COLORURINE YELLOW 08/02/2021 1348   APPEARANCEUR CLEAR 08/02/2021 1348   LABSPEC 1.012 08/02/2021 1348   PHURINE 8.0 08/02/2021 1348   GLUCOSEU NEGATIVE 08/02/2021 1348   HGBUR NEGATIVE 08/02/2021 1348   BILIRUBINUR NEGATIVE 08/02/2021 1348   KETONESUR 5 (A) 08/02/2021 1348   PROTEINUR 100 (A) 08/02/2021 1348   UROBILINOGEN 1.0 10/04/2011 0624   NITRITE NEGATIVE 08/02/2021 1348   LEUKOCYTESUR  SMALL (A) 08/02/2021 1348   Recent Results (from the past 240 hour(s))  MRSA Next Gen by PCR, Nasal     Status: None   Collection Time: 08/03/21  1:22 AM   Specimen: Nasal Mucosa; Nasal Swab  Result Value Ref Range Status   MRSA by PCR  Next Gen NOT DETECTED NOT DETECTED Final    Comment: (NOTE) The GeneXpert MRSA Assay (FDA approved for NASAL specimens only), is one component of a comprehensive MRSA colonization surveillance program. It is not intended to diagnose MRSA infection nor to guide or monitor treatment for MRSA infections. Test performance is not FDA approved in patients less than 24 years old. Performed at [facility], [address], [place], [place] [address]      DG CHEST PORT 1 VIEW  Result Date: 08/03/2021 CLINICAL DATA:  Status post central line placement EXAM: PORTABLE CHEST 1 VIEW COMPARISON:  08/02/2021 FINDINGS: Cardiac shadow is within normal limits. Right jugular temporary dialysis catheter is noted in satisfactory position. No pneumothorax is seen. The lungs are clear. No bony abnormality is noted. IMPRESSION: No pneumothorax following central line placement. Electronically Signed   By: Alcide Clever M.D.   On: 08/03/2021 03:55   CT Head Wo Contrast  Result Date: 08/02/2021 CLINICAL DATA:  Head trauma EXAM: CT HEAD WITHOUT CONTRAST TECHNIQUE: Contiguous axial images were obtained from the base of the skull through the vertex without intravenous contrast. RADIATION DOSE REDUCTION: This exam was performed according to the departmental dose-optimization program which includes automated exposure control, adjustment of the mA and/or kV according to patient size and/or use of iterative reconstruction technique. COMPARISON:  None Available. FINDINGS: Brain: There is no mass, hemorrhage or extra-axial collection. The size and configuration of the ventricles and extra-axial CSF spaces are normal. The brain parenchyma is normal, without acute or chronic infarction. Vascular:  No abnormal hyperdensity of the major intracranial arteries or dural venous sinuses. No intracranial atherosclerosis. Skull: The visualized skull base, calvarium and extracranial soft tissues are normal. Sinuses/Orbits: No fluid levels or advanced mucosal thickening of the visualized paranasal sinuses. No mastoid or middle ear effusion. The orbits are normal. IMPRESSION: Normal head CT. Electronically Signed   By: Deatra Robinson M.D.   On: 08/02/2021 19:14   US Renal  Result Date: 08/02/2021 CLINICAL DATA:  Kidney failure. EXAM: RENAL / URINARY TRACT ULTRASOUND COMPLETE COMPARISON:  None Available. FINDINGS: Right Kidney: Renal measurements: 9.8 x 5.1 x 5.0 cm = volume: 131.9 mL. Normal renal cortical thickness and echogenicity without focal lesions or hydronephrosis. Left Kidney: Renal measurements: 10.3 x 5.1 x 4.6 cm = volume: 126.9 mL. Normal renal cortical thickness and echogenicity without focal lesions or hydronephrosis. Bladder: Appears normal for degree of bladder distention. Other: None. IMPRESSION: Normal renal ultrasound examination. Electronically Signed   By: Rudie Meyer M.D.   On: 08/02/2021 18:21    Family Communication: Sister   Disposition: Status is: Inpatient Remains inpatient appropriate because: Persistent metabolic derangement Planned Discharge Destination: Home  Tyrone Nine, MD 08/04/2021 4:13 PM Page by [email or web address]

## 2021-08-05 DIAGNOSIS — J9811 Atelectasis: Secondary | ICD-10-CM

## 2021-08-05 DIAGNOSIS — I10 Essential (primary) hypertension: Secondary | ICD-10-CM | POA: Diagnosis not present

## 2021-08-05 DIAGNOSIS — E8809 Other disorders of plasma-protein metabolism, not elsewhere classified: Secondary | ICD-10-CM

## 2021-08-05 DIAGNOSIS — N179 Acute kidney failure, unspecified: Secondary | ICD-10-CM | POA: Diagnosis not present

## 2021-08-05 DIAGNOSIS — E875 Hyperkalemia: Secondary | ICD-10-CM | POA: Diagnosis not present

## 2021-08-05 DIAGNOSIS — E119 Type 2 diabetes mellitus without complications: Secondary | ICD-10-CM | POA: Diagnosis not present

## 2021-08-05 LAB — RENAL FUNCTION PANEL
Albumin: 2.6 g/dL — ABNORMAL LOW (ref 3.5–5.0)
Anion gap: 13 (ref 5–15)
BUN: 34 mg/dL — ABNORMAL HIGH (ref 6–20)
CO2: 25 mmol/L (ref 22–32)
Calcium: 7.7 mg/dL — ABNORMAL LOW (ref 8.9–10.3)
Chloride: 93 mmol/L — ABNORMAL LOW (ref 98–111)
Creatinine, Ser: 7.08 mg/dL — ABNORMAL HIGH (ref 0.44–1.00)
GFR, Estimated: 6 mL/min — ABNORMAL LOW (ref >60)
Glucose, Bld: 119 mg/dL — ABNORMAL HIGH (ref 70–99)
Phosphorus: 4.7 mg/dL — ABNORMAL HIGH (ref 2.5–4.6)
Potassium: 4.6 mmol/L (ref 3.5–5.1)
Sodium: 131 mmol/L — ABNORMAL LOW (ref 135–145)

## 2021-08-05 LAB — GLUCOSE, CAPILLARY
Glucose-Capillary: 105 mg/dL — ABNORMAL HIGH (ref 70–99)
Glucose-Capillary: 219 mg/dL — ABNORMAL HIGH (ref 70–99)
Glucose-Capillary: 154 mg/dL — ABNORMAL HIGH (ref 70–99)
Glucose-Capillary: 170 mg/dL — ABNORMAL HIGH (ref 70–99)
Glucose-Capillary: 151 mg/dL — ABNORMAL HIGH (ref 70–99)

## 2021-08-05 LAB — URINE CULTURE: Culture: 60000 — AB

## 2021-08-05 NOTE — Progress Notes (Signed)
Progress Note  Patient: Kayla Berger ZSW:[id number] DOB: 11/12/1960  DOA: 08/02/2021  DOS: 08/05/2021    Brief hospital course: ANALISE GLOTFELTY is a 61 y.o. female with a history of longstanding IDT2DM, HTN who presented with intractable nausea, vomiting, admitted for dehydration with hypotension consistent with hypovolemic shock and hyperkalemic renal failure requiring urgent dialysis.   Assessment and Plan: Acute renal failure: Primarily hemodynamically mediated from profound dehydration from vomiting in setting of diuretic and ACE inhibitor. s/p urgent HD 7/7. Showing evidence of UOP picking up and potassium stable with lokelma, so no repeat dialysis currently planned. Creatinine yet to improve.  - Appreciate nephrology assistance. Urine output is sufficient, though creatinine clearance yet to rebound. Continue supportive care and serial monitoring. - Continue foley and HD cath for now per nephrology.  Intractable nausea, vomiting with contraction metabolic alkalosis: Primary suspect is ozempic as her dose increase from 0.25mg  to 0.50 mg coincides with symptoms. Last dose 7/4, so still likely having some effect. - Support with antiemetics. Low threshold to restart fluids if not taking adequate po.   Odynophagia, dysphagia, globus sensation: In setting of GERD symptoms chronically worsened with ozempic.  - No work up yet begun for this, will start with esophagram.   IDT2DM: Recently significantly improved control with declining insulin requirements due to ozempic and its commensurate weight loss.  - Continue sensitive SSI and monitor for need to augment. Note this is significantly reduced from prior home regimens, though with HbA1c of 6.6%, suspect some hypoglycemia has occurred PTA. - Follow up with Dr. Talmage Nap.  - Hold ozempic and metformin  Lactic acidemia: Lactic acid 8.1, metformin effect in addition to global hypoperfusion due to dehydration.  - Avoid metformin  UTI with leukocytosis:  Not suspected to be etiology of hypotension.  - Continue ceftriaxone, Klebsiella resistant to amp and intermediate to macrobid on culture.  Hyperkalemia:  - Use lokelma prn, currently resolved. - Dietary avoidance recommended to patient   Hypovolemic shock: Resolved.   HLD:  - Continue statin  HTN:  - Hold ACE/HCTZ  Atelectasis:  - Incentive spirometry, OOB  Hypoalbuminemia: Suspect insufficient protein intake of late with related weight loss.  - Dietitian consulted  Hyperphosphatemia: Mild, monitor.  Headache: No red flag symptoms at this time, will monitor with pain control as ordered.  Subjective: Had an episode of emesis and feels no appetite, metallic taste in mouth, and some nausea. Also right sided headache which is better after tramadol. Got up with mobility tech today.  Objective: Vitals:   08/04/21 2145 08/05/21 0400 08/05/21 0408 08/05/21 0849  BP: 113/70 113/67 113/67 124/81  Pulse: 88 83 83 89  Resp: 18 18 18 17   Temp: 98.1 F (36.7 C) 98 F (36.7 C) 98 F (36.7 C) 97.7 F (36.5 C)  TempSrc: Oral Oral Oral   SpO2: 95% 94% 94% 100%  Weight:      Height:       Gen: 61 y.o. female in no distress Pulm: Nonlabored breathing room air. Clear. CV: Regular rate and rhythm. No murmur, rub, or gallop. No JVD, no dependent edema. GI: Abdomen soft, non-tender, non-distended, with normoactive bowel sounds.  Ext: Warm, no deformities Skin: No rashes, lesions or ulcers on visualized skin. Neuro: Alert and oriented. No focal neurological deficits. Psych: Judgement and insight appear fair. Mood euthymic & affect congruent. Behavior is appropriate.    Data Personally reviewed: CBC: Recent Labs  Lab 08/02/21 1351 08/02/21 1848 08/03/21 0612  WBC 12.5*  --  12.0*  NEUTROABS 10.0*  --   --   HGB 13.3 13.6 11.2*  HCT 40.0 40.0 33.6*  MCV 87.5  --  88.7  PLT 362  --  322   Basic Metabolic Panel: Recent Labs  Lab 08/02/21 1832 08/02/21 1848 08/02/21 2247  08/03/21 0612 08/03/21 1207 08/03/21 1547 08/03/21 2320 08/04/21 0415 08/04/21 1621 08/05/21 0207  NA  --    < > 138 139  --   --   --  136 131* 131*  K  --    < > 5.8* 4.0   < > 5.4* 4.1 4.0 4.3 4.6  CL  --   --  74* 88*  --   --   --  93* 89* 93*  CO2  --   --  33* 34*  --   --   --  32 25 25  GLUCOSE  --   --  183* 92  --   --   --  101* 159* 119*  BUN  --   --  73* 41*  --   --   --  31* 34* 34*  CREATININE  --   --  10.23* 6.45*  --   --   --  6.97* 6.97* 7.08*  CALCIUM  --   --  9.7 8.8*  --   --   --  8.0* 7.7* 7.7*  MG 2.7*  --   --   --   --   --   --   --   --   --   PHOS 8.0*  --  7.2*  --   --   --   --   --   --  4.7*   < > = values in this interval not displayed.   GFR: Estimated Creatinine Clearance: 8.3 mL/min (A) (by C-G formula based on SCr of 7.08 mg/dL (H)). Liver Function Tests: Recent Labs  Lab 08/02/21 1351 08/02/21 2247 08/05/21 0207  AST 21  --   --   ALT 24  --   --   ALKPHOS 73  --   --   BILITOT 1.0  --   --   PROT 7.1  --   --   ALBUMIN 4.1 3.5 2.6*   Recent Labs  Lab 08/02/21 1351  LIPASE 45   HbA1C: Recent Labs    08/04/21 0415  HGBA1C 6.6*   CBG: Recent Labs  Lab 08/04/21 2142 08/04/21 2342 08/05/21 0357 08/05/21 0846 08/05/21 1136  GLUCAP 152* 152* 105* 219* 154*   Urine analysis:    Component Value Date/Time   COLORURINE YELLOW 08/02/2021 1348   APPEARANCEUR CLEAR 08/02/2021 1348   LABSPEC 1.012 08/02/2021 1348   PHURINE 8.0 08/02/2021 1348   GLUCOSEU NEGATIVE 08/02/2021 1348   HGBUR NEGATIVE 08/02/2021 1348   BILIRUBINUR NEGATIVE 08/02/2021 1348   KETONESUR 5 (A) 08/02/2021 1348   PROTEINUR 100 (A) 08/02/2021 1348   UROBILINOGEN 1.0 10/04/2011 0624   NITRITE NEGATIVE 08/02/2021 1348   LEUKOCYTESUR SMALL (A) 08/02/2021 1348   Recent Results (from the past 240 hour(s))  Urine Culture     Status: Abnormal   Collection Time: 08/02/21  1:48 PM   Specimen: Urine, Clean Catch  Result Value Ref Range Status    Specimen Description URINE, CLEAN CATCH  Final   Special Requests   Final    NONE Performed at [facility], [address], [place], [place] [address]    Culture 60,000 COLONIES/mL KLEBSIELLA  PNEUMONIAE (A)  Final   Report Status 08/05/2021 FINAL  Final   Organism ID, Bacteria KLEBSIELLA PNEUMONIAE (A)  Final      Susceptibility   Klebsiella pneumoniae - MIC*    AMPICILLIN RESISTANT Resistant     CEFAZOLIN <=4 SENSITIVE Sensitive     CEFEPIME <=0.12 SENSITIVE Sensitive     CEFTRIAXONE <=0.25 SENSITIVE Sensitive     CIPROFLOXACIN <=0.25 SENSITIVE Sensitive     GENTAMICIN <=1 SENSITIVE Sensitive     IMIPENEM <=0.25 SENSITIVE Sensitive     NITROFURANTOIN 64 INTERMEDIATE Intermediate     TRIMETH/SULFA <=20 SENSITIVE Sensitive     AMPICILLIN/SULBACTAM 4 SENSITIVE Sensitive     PIP/TAZO <=4 SENSITIVE Sensitive     * 60,000 COLONIES/mL KLEBSIELLA PNEUMONIAE  MRSA Next Gen by PCR, Nasal     Status: None   Collection Time: 08/03/21  1:22 AM   Specimen: Nasal Mucosa; Nasal Swab  Result Value Ref Range Status   MRSA by PCR Next Gen NOT DETECTED NOT DETECTED Final    Comment: (NOTE) The GeneXpert MRSA Assay (FDA approved for NASAL specimens only), is one component of a comprehensive MRSA colonization surveillance program. It is not intended to diagnose MRSA infection nor to guide or monitor treatment for MRSA infections. Test performance is not FDA approved in patients less than 31 years old. Performed at [facility], [address], [place], [place] [address]      No results found.  Family Communication: Sister, Dr. Georgiann Mohs, at bedside  Disposition: Status is: Inpatient Remains inpatient appropriate because: Persistent metabolic derangement Planned Discharge Destination: Home  Tyrone Nine, MD 08/05/2021 2:05 PM Page by [email or web address]

## 2021-08-05 NOTE — Progress Notes (Signed)
Patient ID: Kayla Berger, female   DOB: 06-Jul-1960, 61 y.o.   MRN: [id number] [facility] Progress Note   Assessment/ Plan:   1. Acute kidney Injury: This appears to have been hemodynamically mediated from volume contraction in the setting of ongoing lisinopril/HCTZ use-possibly with evolution to mild ischemic ATN based on limited response to volume resuscitation.  Only a slight rise/unchanged creatinine overnight with improvement of urine output and no significant worsening of azotemia.  She does not have any acute indications for dialysis at this time and we will monitor urine output/labs for renal recovery. 2.  Hyperkalemia: Secondary to acute kidney injury and treated with hemodialysis after failure to improve with medical measures.  We will discontinue Lokelma at this time 3.  Intractable nausea/vomiting prior to admission: Suspected to be from Ozempic and possibly severe azotemia.  Recurrence of vomiting noted today-continue symptomatic management. 4.  Metabolic alkalosis: Suspected to be secondary to volume contraction and preceding hydrochlorothiazide use, monitor with ongoing management/renal recovery. 5.  Hypotension: Blood pressure back to normal range-hypotension was suspected to be from significant intravascular volume contraction/hypovolemia.  On antimicrobial coverage with ceftriaxone and weaned off of pressor.    Subjective:   Rough morning with episode of emesis earlier and having mild headache.   Objective:   BP 124/81 (BP Location: Left Arm)   Pulse 89   Temp 97.7 F (36.5 C)   Resp 17   Ht 5\' 3"  (1.6 m)   Wt 76.8 kg   LMP 10/06/2012   SpO2 100%   BMI 29.99 kg/m   Intake/Output Summary (Last 24 hours) at 08/05/2021 1036 Last data filed at 08/05/2021 10/06/2021 Gross per 24 hour  Intake 810.55 ml  Output 2975 ml  Net -2164.45 ml   Weight change:   Physical Exam: Gen: Appears somewhat uncomfortable resting in bed, sister at bedside  CVS: Pulse regular  rhythm, normal rate, S1 and S2 normal Resp: Anteriorly clear to auscultation, no rales/rhonchi Abd: Soft, flat, nontender, bowel sounds normal Ext: No lower extremity edema  Imaging: No results found.  Labs: BMET Recent Labs  Lab 08/02/21 1351 08/02/21 1832 08/02/21 1848 08/02/21 2100 08/02/21 2247 08/03/21 0612 08/03/21 1207 08/03/21 1547 08/03/21 2320 08/04/21 0415 08/04/21 1621 08/05/21 0207  NA 140  --  128*  --  138 139  --   --   --  136 131* 131*  K 6.4*  --  6.9*   < > 5.8* 4.0 4.9 5.4* 4.1 4.0 4.3 4.6  CL 76*  --   --   --  74* 88*  --   --   --  93* 89* 93*  CO2 36*  --   --   --  33* 34*  --   --   --  32 25 25  GLUCOSE 150*  --   --   --  183* 92  --   --   --  101* 159* 119*  BUN 68*  --   --   --  73* 41*  --   --   --  31* 34* 34*  CREATININE 9.87*  --   --   --  10.23* 6.45*  --   --   --  6.97* 6.97* 7.08*  CALCIUM 10.7*  --   --   --  9.7 8.8*  --   --   --  8.0* 7.7* 7.7*  PHOS  --  8.0*  --   --  7.2*  --   --   --   --   --   --  4.7*   < > = values in this interval not displayed.   CBC Recent Labs  Lab 08/02/21 1351 08/02/21 1848 08/03/21 0612  WBC 12.5*  --  12.0*  NEUTROABS 10.0*  --   --   HGB 13.3 13.6 11.2*  HCT 40.0 40.0 33.6*  MCV 87.5  --  88.7  PLT 362  --  322    Medications:     aspirin  81 mg Oral q AM   atorvastatin  10 mg Oral QHS   Chlorhexidine Gluconate Cloth  6 each Topical Q0600   DULoxetine  60 mg Oral BID   gabapentin  200 mg Oral QHS   heparin injection (subcutaneous)  5,000 Units Subcutaneous Q8H   insulin aspart  0-9 Units Subcutaneous Q4H   sodium chloride flush  10-40 mL Intracatheter Q12H   sodium zirconium cyclosilicate  10 g Oral Daily   sucralfate  1 g Oral TID WC & HS   Zetta Bills, MD 08/05/2021, 10:36 AM

## 2021-08-05 NOTE — Progress Notes (Signed)
Mobility Specialist Progress Note:   08/05/21 1023  Mobility  Activity Ambulated with assistance in hallway  Level of Assistance Standby assist, set-up cues, supervision of patient - no hands on  Assistive Device None  Distance Ambulated (ft) 300 ft  Activity Response Tolerated well  $Mobility charge 1 Mobility   Pt received in bed willing to participate in mobility. Complaints of a headache and stomach soreness. Left in bed with call bell in reach and all needs met.   [facility] Yaritzi Craun Mobility Specialist

## 2021-08-06 ENCOUNTER — Inpatient Hospital Stay (HOSPITAL_COMMUNITY): Payer: 59

## 2021-08-06 DIAGNOSIS — Z794 Long term (current) use of insulin: Secondary | ICD-10-CM | POA: Diagnosis not present

## 2021-08-06 DIAGNOSIS — N171 Acute kidney failure with acute cortical necrosis: Secondary | ICD-10-CM

## 2021-08-06 DIAGNOSIS — E875 Hyperkalemia: Secondary | ICD-10-CM | POA: Diagnosis not present

## 2021-08-06 DIAGNOSIS — E119 Type 2 diabetes mellitus without complications: Secondary | ICD-10-CM | POA: Diagnosis not present

## 2021-08-06 LAB — GLUCOSE, CAPILLARY
Glucose-Capillary: 111 mg/dL — ABNORMAL HIGH (ref 70–99)
Glucose-Capillary: 153 mg/dL — ABNORMAL HIGH (ref 70–99)
Glucose-Capillary: 179 mg/dL — ABNORMAL HIGH (ref 70–99)
Glucose-Capillary: 194 mg/dL — ABNORMAL HIGH (ref 70–99)
Glucose-Capillary: 221 mg/dL — ABNORMAL HIGH (ref 70–99)
Glucose-Capillary: 228 mg/dL — ABNORMAL HIGH (ref 70–99)

## 2021-08-06 LAB — RENAL FUNCTION PANEL
Albumin: 2.8 g/dL — ABNORMAL LOW (ref 3.5–5.0)
Anion gap: 13 (ref 5–15)
BUN: 34 mg/dL — ABNORMAL HIGH (ref 6–20)
CO2: 32 mmol/L (ref 22–32)
Calcium: 8.1 mg/dL — ABNORMAL LOW (ref 8.9–10.3)
Chloride: 93 mmol/L — ABNORMAL LOW (ref 98–111)
Creatinine, Ser: 6.39 mg/dL — ABNORMAL HIGH (ref 0.44–1.00)
GFR, Estimated: 7 mL/min — ABNORMAL LOW (ref >60)
Glucose, Bld: 138 mg/dL — ABNORMAL HIGH (ref 70–99)
Phosphorus: 6.4 mg/dL — ABNORMAL HIGH (ref 2.5–4.6)
Potassium: 3.2 mmol/L — ABNORMAL LOW (ref 3.5–5.1)
Sodium: 138 mmol/L (ref 135–145)

## 2021-08-06 LAB — CBC
HCT: 28.4 % — ABNORMAL LOW (ref 36.0–46.0)
Hemoglobin: 9.8 g/dL — ABNORMAL LOW (ref 12.0–15.0)
MCH: 29.8 pg (ref 26.0–34.0)
MCHC: 34.5 g/dL (ref 30.0–36.0)
MCV: 86.3 fL (ref 80.0–100.0)
Platelets: 224 10*3/uL (ref 150–400)
RBC: 3.29 MIL/uL — ABNORMAL LOW (ref 3.87–5.11)
RDW: 12.7 % (ref 11.5–15.5)
WBC: 6.8 10*3/uL (ref 4.0–10.5)
nRBC: 0 % (ref 0.0–0.2)

## 2021-08-06 LAB — KAPPA/LAMBDA LIGHT CHAINS
Kappa free light chain: 108.5 mg/L — ABNORMAL HIGH (ref 3.3–19.4)
Kappa, lambda light chain ratio: 2 — ABNORMAL HIGH (ref 0.26–1.65)
Lambda free light chains: 54.3 mg/L — ABNORMAL HIGH (ref 5.7–26.3)

## 2021-08-06 MED ORDER — ENSURE ENLIVE PO LIQD
237.0000 mL | Freq: Two times a day (BID) | ORAL | Status: DC
  Administered 2021-08-07 – 2021-08-08 (×3): 237 mL via ORAL

## 2021-08-06 MED ORDER — PANTOPRAZOLE SODIUM 40 MG PO TBEC
40.0000 mg | DELAYED_RELEASE_TABLET | Freq: Every day | ORAL | Status: DC
  Administered 2021-08-06 – 2021-08-07 (×2): 40 mg via ORAL
  Filled 2021-08-06 (×2): qty 1

## 2021-08-06 NOTE — Plan of Care (Signed)
  Problem: Education: Goal: Ability to describe self-care measures that may prevent or decrease complications (Diabetes Survival Skills Education) will improve Outcome: Progressing Goal: Individualized Educational Video(s) Outcome: Progressing   Problem: Coping: Goal: Ability to adjust to condition or change in health will improve Outcome: Progressing   Problem: Fluid Volume: Goal: Ability to maintain a balanced intake and output will improve Outcome: Progressing   Problem: Health Behavior/Discharge Planning: Goal: Ability to identify and utilize available resources and services will improve Outcome: Progressing Goal: Ability to manage health-related needs will improve Outcome: Progressing   Problem: Metabolic: Goal: Ability to maintain appropriate glucose levels will improve Outcome: Progressing   Problem: Nutritional: Goal: Maintenance of adequate nutrition will improve Outcome: Progressing Goal: Progress toward achieving an optimal weight will improve Outcome: Progressing   Problem: Skin Integrity: Goal: Risk for impaired skin integrity will decrease Outcome: Progressing   Problem: Tissue Perfusion: Goal: Adequacy of tissue perfusion will improve Outcome: Progressing   Problem: Elimination: Goal: Will not experience complications related to bowel motility Outcome: Progressing Goal: Will not experience complications related to urinary retention Outcome: Progressing

## 2021-08-06 NOTE — Progress Notes (Signed)
PROGRESS NOTE    Kayla Berger  WUJ:[id number] DOB: 29-Feb-1960 DOA: 08/02/2021 PCP: Irena Reichmann, DO    Brief Narrative:  61 year old female with history of insulin-dependent diabetes on insulin and Ozempic, essential hypertension present to the hospital with intractable nausea, vomiting for about 1 week.  In the emergency room she was found to be hypotensive, severely dehydrated with hypovolemic shock and hyperkalemic renal failure requiring urgent hemodialysis.  She received 1 dialysis sessions with good response.  Gradually improving.   Assessment & Plan:   Acute renal failure with hyperkalemia, hypovolemic shock. Profound dehydration from vomiting in the setting of diuretics and ACE inhibitor, also using metformin. Right IJ catheter, urgent hemodialysis 7/7.  Hemodynamically stabilizing. Urine output more than 4 L / 24 hours.  Clinically improving.  BUN/creatinine is lagging behind the clinical improvement, however expect to improve over next several days. Nephrology following. Continue Foley catheter today, will let nephrology decide when to take out hemodialysis catheter.  Intractable nausea, vomiting with contraction metabolic alkalosis: Suspected due to increasing dose of Ozempic.  Clinically stabilizing.  Nausea medications.  Frequent small meals to encourage. She did complain of odynophagia dysphagia and globus sensation, barium esophagogram was done today that looks very normal.  Likely has reflux or autonomic neuropathy. All-time reflux precautions.  Nausea medications. Elevated head of the bed. Start Protonix 40 mg daily.  Insulin-dependent type 2 diabetes: Recently significantly controlled after starting Ozempic, however blood sugars normal without much intervention in the hospital.  Known A1c of 6.6. We will continue to titrate her insulin doses depends upon her oral intake and decide discharge dose of insulin.  We will hold Ozempic for now.  Essential hypertension: Yet  not needing any antihypertensives.  Will avoid ACE and hydrochlorothiazide.  Headache: Symptomatic treatment.   DVT prophylaxis: heparin injection 5,000 Units Start: 08/03/21 1515 SCDs Start: 08/02/21 2003   Code Status: Full code Family Communication: Patient's sister at the bedside and she is a critical care physician. Disposition Plan: Status is: Inpatient Remains inpatient appropriate because: Significant electrolyte abnormalities, renal functions recovery, poor oral intake.     Consultants:  Nephrology  Procedures:  Dialysis 7/7  Antimicrobials:  None   Subjective: Patient seen and examined.  Poor appetite but no obvious nausea vomiting today.  Denies any abdomen pain or distention.  No BM since admission.  No meaningful oral intake but overall improving. Still has some headache without blurry vision or vomiting. Telemetry without events.  Objective: Vitals:   08/05/21 1941 08/05/21 2000 08/06/21 0509 08/06/21 0728  BP: (!) 149/89 (!) 149/89 123/76 109/64  Pulse: 88 88 88 89  Resp: 19 19 17    Temp: 97.8 F (36.6 C) 97.8 F (36.6 C) 98 F (36.7 C) 98.5 F (36.9 C)  TempSrc:  Oral  Oral  SpO2: 96% 96% 95% 99%  Weight:      Height:        Intake/Output Summary (Last 24 hours) at 08/06/2021 1227 Last data filed at 08/06/2021 0500 Gross per 24 hour  Intake 260 ml  Output 3712 ml  Net -3452 ml   Filed Weights   08/02/21 1400 08/02/21 2236  Weight: 74.4 kg 76.8 kg    Examination:  General exam: Appears calm and comfortable  Respiratory system: No added sounds. Cardiovascular system: S1 & S2 heard, RRR. No pedal edema. Gastrointestinal system: Abdomen is nondistended, soft and nontender. No organomegaly or masses felt. Normal bowel sounds heard. Central nervous system: Alert and oriented. No focal neurological deficits.  Extremities: Symmetric 5 x 5 power. Skin: No rashes, lesions or ulcers Psychiatry: Judgement and insight appear normal. Mood & affect  appropriate.     Data Reviewed: I have personally reviewed following labs and imaging studies  CBC: Recent Labs  Lab 08/02/21 1351 08/02/21 1848 08/03/21 0612 08/06/21 0304  WBC 12.5*  --  12.0* 6.8  NEUTROABS 10.0*  --   --   --   HGB 13.3 13.6 11.2* 9.8*  HCT 40.0 40.0 33.6* 28.4*  MCV 87.5  --  88.7 86.3  PLT 362  --  322 224   Basic Metabolic Panel: Recent Labs  Lab 08/02/21 1832 08/02/21 1848 08/02/21 2247 08/03/21 0612 08/03/21 1207 08/03/21 2320 08/04/21 0415 08/04/21 1621 08/05/21 0207 08/06/21 0304  NA  --    < > 138 139  --   --  136 131* 131* 138  K  --    < > 5.8* 4.0   < > 4.1 4.0 4.3 4.6 3.2*  CL  --   --  74* 88*  --   --  93* 89* 93* 93*  CO2  --   --  33* 34*  --   --  32 25 25 32  GLUCOSE  --   --  183* 92  --   --  101* 159* 119* 138*  BUN  --   --  73* 41*  --   --  31* 34* 34* 34*  CREATININE  --   --  10.23* 6.45*  --   --  6.97* 6.97* 7.08* 6.39*  CALCIUM  --   --  9.7 8.8*  --   --  8.0* 7.7* 7.7* 8.1*  MG 2.7*  --   --   --   --   --   --   --   --   --   PHOS 8.0*  --  7.2*  --   --   --   --   --  4.7* 6.4*   < > = values in this interval not displayed.   GFR: Estimated Creatinine Clearance: 9.2 mL/min (A) (by C-G formula based on SCr of 6.39 mg/dL (H)). Liver Function Tests: Recent Labs  Lab 08/02/21 1351 08/02/21 2247 08/05/21 0207 08/06/21 0304  AST 21  --   --   --   ALT 24  --   --   --   ALKPHOS 73  --   --   --   BILITOT 1.0  --   --   --   PROT 7.1  --   --   --   ALBUMIN 4.1 3.5 2.6* 2.8*   Recent Labs  Lab 08/02/21 1351  LIPASE 45   No results for input(s): "AMMONIA" in the last 168 hours. Coagulation Profile: No results for input(s): "INR", "PROTIME" in the last 168 hours. Cardiac Enzymes: No results for input(s): "CKTOTAL", "CKMB", "CKMBINDEX", "TROPONINI" in the last 168 hours. BNP (last 3 results) No results for input(s): "PROBNP" in the last 8760 hours. HbA1C: Recent Labs    08/04/21 0415  HGBA1C  6.6*   CBG: Recent Labs  Lab 08/05/21 1942 08/06/21 0002 08/06/21 0510 08/06/21 0728 08/06/21 1148  GLUCAP 151* 194* 111* 153* 179*   Lipid Profile: No results for input(s): "CHOL", "HDL", "LDLCALC", "TRIG", "CHOLHDL", "LDLDIRECT" in the last 72 hours. Thyroid Function Tests: No results for input(s): "TSH", "T4TOTAL", "FREET4", "T3FREE", "THYROIDAB" in the last 72 hours. Anemia Panel: No results for input(s): "VITAMINB12", "FOLATE", "  FERRITIN", "TIBC", "IRON", "RETICCTPCT" in the last 72 hours. Sepsis Labs: Recent Labs  Lab 08/02/21 1915 08/02/21 2248 08/03/21 0123 08/03/21 0940  LATICACIDVEN 6.6* 8.1* 7.2* 2.0*    Recent Results (from the past 240 hour(s))  Urine Culture     Status: Abnormal   Collection Time: 08/02/21  1:48 PM   Specimen: Urine, Clean Catch  Result Value Ref Range Status   Specimen Description URINE, CLEAN CATCH  Final   Special Requests   Final    NONE Performed at [facility], [address] [place], [place] [address]    Culture 60,000 COLONIES/mL KLEBSIELLA PNEUMONIAE (A)  Final   Report Status 08/05/2021 FINAL  Final   Organism ID, Bacteria KLEBSIELLA PNEUMONIAE (A)  Final      Susceptibility   Klebsiella pneumoniae - MIC*    AMPICILLIN RESISTANT Resistant     CEFAZOLIN <=4 SENSITIVE Sensitive     CEFEPIME <=0.12 SENSITIVE Sensitive     CEFTRIAXONE <=0.25 SENSITIVE Sensitive     CIPROFLOXACIN <=0.25 SENSITIVE Sensitive     GENTAMICIN <=1 SENSITIVE Sensitive     IMIPENEM <=0.25 SENSITIVE Sensitive     NITROFURANTOIN 64 INTERMEDIATE Intermediate     TRIMETH/SULFA <=20 SENSITIVE Sensitive     AMPICILLIN/SULBACTAM 4 SENSITIVE Sensitive     PIP/TAZO <=4 SENSITIVE Sensitive     * 60,000 COLONIES/mL KLEBSIELLA PNEUMONIAE  MRSA Next Gen by PCR, Nasal     Status: None   Collection Time: 08/03/21  1:22 AM   Specimen: Nasal Mucosa; Nasal Swab  Result Value Ref Range Status   MRSA by PCR Next Gen NOT DETECTED NOT DETECTED Final     Comment: (NOTE) The GeneXpert MRSA Assay (FDA approved for NASAL specimens only), is one component of a comprehensive MRSA colonization surveillance program. It is not intended to diagnose MRSA infection nor to guide or monitor treatment for MRSA infections. Test performance is not FDA approved in patients less than 39 years old. Performed at [facility], [address] [place], [place] [address]          Radiology Studies: No results found.      Scheduled Meds:  aspirin  81 mg Oral q AM   atorvastatin  10 mg Oral QHS   Chlorhexidine Gluconate Cloth  6 each Topical Q0600   DULoxetine  60 mg Oral BID   gabapentin  200 mg Oral QHS   heparin injection (subcutaneous)  5,000 Units Subcutaneous Q8H   insulin aspart  0-9 Units Subcutaneous Q4H   pantoprazole  40 mg Oral Daily   sodium chloride flush  10-40 mL Intracatheter Q12H   Continuous Infusions:  sodium chloride Stopped (08/04/21 0025)   anticoagulant sodium citrate       LOS: 4 days    Time spent: 35 minutes    Dorcas Carrow, MD [facility] Pager [phone]

## 2021-08-06 NOTE — Plan of Care (Signed)
  Problem: Fluid Volume: Goal: Ability to maintain a balanced intake and output will improve Outcome: Progressing   Problem: Health Behavior/Discharge Planning: Goal: Ability to manage health-related needs will improve Outcome: Progressing   Problem: Metabolic: Goal: Ability to maintain appropriate glucose levels will improve Outcome: Progressing   

## 2021-08-06 NOTE — Progress Notes (Addendum)
Initial Nutrition Assessment  DOCUMENTATION CODES:   Non-severe (moderate) malnutrition in context of chronic illness  INTERVENTION:   Ensure Enlive po BID, each supplement provides 350 kcal and 20 grams of protein. Education provided on small, frequent meals for dysmotility and GERD. Outpatient diet education ordered with approval from attending physician.    NUTRITION DIAGNOSIS:   Moderate Malnutrition related to chronic illness (chronic decreased appetite and nausea associated with Ozempic use) as evidenced by mild muscle depletion, energy intake < 75% for > or equal to 1 month.  GOAL:   Patient will meet greater than or equal to 90% of their needs  MONITOR:   PO intake, Supplement acceptance, Labs  REASON FOR ASSESSMENT:   Consult Poor PO, Diet education  ASSESSMENT:   61 yo female admitted with hypotension, dehydration, hypovolemic shock, hyperkalemia requiring emergent HD, intractable nausea and vomiting. PMH includes insulin dependent diabetes, HTN, PCOS, hypothyroidism, arthritis, GERD.  Patient with recent weight loss since starting Ozempic. She started it for her diabetes and her glucose became very stable, however, she developed a very poor appetite and lost quite a bit of weight. She has been eating a fruit and peanut butter for breakfast, lunch, and dinner. Her sister is at bedside and reports that patient has lost a lot of muscle mass in her legs. Patient is very concerned with her weight. She is constantly trying to lose weight and was happy to have lost so much weight with Ozempic. She is no longer taking Ozempic (discontinued this admission). Reviewed the importance of adequate intake and 5-6 small meals daily to help with GERD, dysmotility, and glucose control. Sister is interested in patient receiving outpatient education after d/c home. Provided GERD and gastroparesis handouts. Patient and sister were very appreciative.   S/P esophagram today, suggesting severe  GERD, small sliding-type hiatal hernia, and esophageal dysmotility.   Weight history reviewed. Patient has lost 11% of her usual weight within the past 6 months, which is significant. Intake has been poor as well. She meets criteria for moderate malnutrition.   Patient agreed to drink Ensure supplements BID to help increase her intake until appetite and PO intake have greatly improved.   Labs and medications reviewed.  NUTRITION - FOCUSED PHYSICAL EXAM:  Flowsheet Row Most Recent Value  Orbital Region No depletion  Upper Arm Region Mild depletion  Thoracic and Lumbar Region No depletion  Buccal Region No depletion  Temple Region No depletion  Clavicle Bone Region Mild depletion  Clavicle and Acromion Bone Region No depletion  Scapular Bone Region No depletion  Dorsal Hand No depletion  Patellar Region No depletion  Anterior Thigh Region No depletion  Posterior Calf Region Mild depletion  Edema (RD Assessment) Mild  Hair Reviewed  Eyes Reviewed  Mouth Reviewed  Skin Reviewed  Nails Reviewed       Diet Order:   Diet Order             Diet Carb Modified Fluid consistency: Thin; Room service appropriate? Yes  Diet effective now                   EDUCATION NEEDS:   Education needs have been addressed  Skin:  Skin Assessment: Reviewed RN Assessment  Last BM:  7/5  Height:   Ht Readings from Last 1 Encounters:  08/02/21 5\' 3"  (1.6 m)    Weight:   Wt Readings from Last 1 Encounters:  08/02/21 76.8 kg    BMI:  Body mass index is  29.99 kg/m.  Estimated Nutritional Needs:   Kcal:  1700-1900  Protein:  85-95 gm  Fluid:  1.8-2 L    Gabriel Rainwater RD, LDN, CNSC Please refer to Amion for contact information.

## 2021-08-06 NOTE — Progress Notes (Addendum)
Patient ID: Kayla Berger, female   DOB: 04/27/1960, 61 y.o.   MRN: [id number] [facility] Progress Note   Assessment/ Plan:   1. Acute kidney Injury: Normal baseline renal function. Renal US normal, UA c/w UTI not GN.  This appears to have been hemodynamically mediated from volume contraction in the setting of ongoing lisinopril/HCTZ use-possibly with evolution to mild ischemic ATN based on limited response to volume resuscitation.  Required RRT 7/7 x 1 for hyperK.  Now polyuric yest with modest improvement in labs.  She does not have any acute indications for dialysis and I don't anticipate - d/c HD catheter.  Should see Cr improving in the next few days.  Cont to hold ACEi/diuretic for now.  2.  Hyperkalemia: resolved  3.  Intractable nausea/vomiting prior to admission: Suspected to be from Ozempic and possibly severe azotemia.  Recurrence of vomiting noted today-continue symptomatic management. 4.  Metabolic alkalosis: Suspected to be secondary to volume contraction and preceding hydrochlorothiazide use, monitor with ongoing management/renal recovery. 5.  Hypotension: Blood pressure back to normal range-hypotension was suspected to be from significant intravascular volume contraction/hypovolemia + sepsis for Klebsiella UTI S/p ceftriaxone and weaned off of pressor.   6.  Anemia: noted down trending Hb.  Denies bleeding and no obvious etiology.  Recheck in AM.   Subjective:   Feeling better - no emesis, ate 1/2 sandwich for lunch which was an improvement.   Foley remains.  Sister PCCM MD bedside.     Objective:   BP 109/64   Pulse 89   Temp 98.5 F (36.9 C) (Oral)   Resp 17   Ht 5\' 3"  (1.6 m)   Wt 76.8 kg   LMP 10/06/2012   SpO2 99%   BMI 29.99 kg/m   Intake/Output Summary (Last 24 hours) at 08/06/2021 1232 Last data filed at 08/06/2021 0500 Gross per 24 hour  Intake 260 ml  Output 3712 ml  Net -3452 ml    Weight change:   Physical Exam: Gen: Appears  comfortable resting in bed, sister at bedside  CVS: Pulse regular rhythm, normal rate, S1 and S2 normal Resp: Anteriorly clear to auscultation, no rales/rhonchi Abd: Soft, flat, nontender, bowel sounds normal Ext: No lower extremity edema, feels hands 'puffy' = no marked edema  Imaging: No results found.  Labs: BMET Recent Labs  Lab 08/02/21 1351 08/02/21 1832 08/02/21 1848 08/02/21 2100 08/02/21 2247 08/03/21 0612 08/03/21 1207 08/03/21 1547 08/03/21 2320 08/04/21 0415 08/04/21 1621 08/05/21 0207 08/06/21 0304  NA 140  --  128*  --  138 139  --   --   --  136 131* 131* 138  K 6.4*  --  6.9*   < > 5.8* 4.0 4.9 5.4* 4.1 4.0 4.3 4.6 3.2*  CL 76*  --   --   --  74* 88*  --   --   --  93* 89* 93* 93*  CO2 36*  --   --   --  33* 34*  --   --   --  32 25 25 32  GLUCOSE 150*  --   --   --  183* 92  --   --   --  101* 159* 119* 138*  BUN 68*  --   --   --  73* 41*  --   --   --  31* 34* 34* 34*  CREATININE 9.87*  --   --   --  10.23* 6.45*  --   --   --  6.97* 6.97* 7.08* 6.39*  CALCIUM 10.7*  --   --   --  9.7 8.8*  --   --   --  8.0* 7.7* 7.7* 8.1*  PHOS  --  8.0*  --   --  7.2*  --   --   --   --   --   --  4.7* 6.4*   < > = values in this interval not displayed.    CBC Recent Labs  Lab 08/02/21 1351 08/02/21 1848 08/03/21 0612 08/06/21 0304  WBC 12.5*  --  12.0* 6.8  NEUTROABS 10.0*  --   --   --   HGB 13.3 13.6 11.2* 9.8*  HCT 40.0 40.0 33.6* 28.4*  MCV 87.5  --  88.7 86.3  PLT 362  --  322 224     Medications:     aspirin  81 mg Oral q AM   atorvastatin  10 mg Oral QHS   Chlorhexidine Gluconate Cloth  6 each Topical Q0600   DULoxetine  60 mg Oral BID   gabapentin  200 mg Oral QHS   heparin injection (subcutaneous)  5,000 Units Subcutaneous Q8H   insulin aspart  0-9 Units Subcutaneous Q4H   pantoprazole  40 mg Oral Daily   sodium chloride flush  10-40 mL Intracatheter Q12H   Estill Bakes MD [facility] Pager [phone]

## 2021-08-07 DIAGNOSIS — E440 Moderate protein-calorie malnutrition: Secondary | ICD-10-CM | POA: Insufficient documentation

## 2021-08-07 DIAGNOSIS — Z794 Long term (current) use of insulin: Secondary | ICD-10-CM | POA: Diagnosis not present

## 2021-08-07 DIAGNOSIS — E875 Hyperkalemia: Secondary | ICD-10-CM | POA: Diagnosis not present

## 2021-08-07 DIAGNOSIS — E119 Type 2 diabetes mellitus without complications: Secondary | ICD-10-CM | POA: Diagnosis not present

## 2021-08-07 DIAGNOSIS — N171 Acute kidney failure with acute cortical necrosis: Secondary | ICD-10-CM | POA: Diagnosis not present

## 2021-08-07 LAB — RENAL FUNCTION PANEL
Albumin: 3.1 g/dL — ABNORMAL LOW (ref 3.5–5.0)
Albumin: 3.2 g/dL — ABNORMAL LOW (ref 3.5–5.0)
Anion gap: 15 (ref 5–15)
Anion gap: 16 — ABNORMAL HIGH (ref 5–15)
BUN: 30 mg/dL — ABNORMAL HIGH (ref 6–20)
BUN: 28 mg/dL — ABNORMAL HIGH (ref 6–20)
CO2: 34 mmol/L — ABNORMAL HIGH (ref 22–32)
CO2: 28 mmol/L (ref 22–32)
Calcium: 8.9 mg/dL (ref 8.9–10.3)
Calcium: 8.8 mg/dL — ABNORMAL LOW (ref 8.9–10.3)
Chloride: 94 mmol/L — ABNORMAL LOW (ref 98–111)
Chloride: 97 mmol/L — ABNORMAL LOW (ref 98–111)
Creatinine, Ser: 5.14 mg/dL — ABNORMAL HIGH (ref 0.44–1.00)
Creatinine, Ser: 4.69 mg/dL — ABNORMAL HIGH (ref 0.44–1.00)
GFR, Estimated: 9 mL/min — ABNORMAL LOW (ref >60)
GFR, Estimated: 10 mL/min — ABNORMAL LOW (ref >60)
Glucose, Bld: 175 mg/dL — ABNORMAL HIGH (ref 70–99)
Glucose, Bld: 165 mg/dL — ABNORMAL HIGH (ref 70–99)
Phosphorus: 4.5 mg/dL (ref 2.5–4.6)
Phosphorus: 4.3 mg/dL (ref 2.5–4.6)
Potassium: 4.4 mmol/L (ref 3.5–5.1)
Potassium: 3.6 mmol/L (ref 3.5–5.1)
Sodium: 143 mmol/L (ref 135–145)
Sodium: 141 mmol/L (ref 135–145)

## 2021-08-07 LAB — CBC
HCT: 29.3 % — ABNORMAL LOW (ref 36.0–46.0)
HCT: 31.2 % — ABNORMAL LOW (ref 36.0–46.0)
Hemoglobin: 10 g/dL — ABNORMAL LOW (ref 12.0–15.0)
Hemoglobin: 10.7 g/dL — ABNORMAL LOW (ref 12.0–15.0)
MCH: 29.9 pg (ref 26.0–34.0)
MCH: 30.1 pg (ref 26.0–34.0)
MCHC: 34.1 g/dL (ref 30.0–36.0)
MCHC: 34.3 g/dL (ref 30.0–36.0)
MCV: 87.7 fL (ref 80.0–100.0)
MCV: 87.6 fL (ref 80.0–100.0)
Platelets: 225 10*3/uL (ref 150–400)
Platelets: 229 10*3/uL (ref 150–400)
RBC: 3.34 MIL/uL — ABNORMAL LOW (ref 3.87–5.11)
RBC: 3.56 MIL/uL — ABNORMAL LOW (ref 3.87–5.11)
RDW: 13 % (ref 11.5–15.5)
RDW: 13 % (ref 11.5–15.5)
WBC: 7 10*3/uL (ref 4.0–10.5)
WBC: 6.8 10*3/uL (ref 4.0–10.5)
nRBC: 0 % (ref 0.0–0.2)
nRBC: 0 % (ref 0.0–0.2)

## 2021-08-07 LAB — ANCA PROFILE
Anti-MPO Antibodies: <0.2 units (ref 0.0–0.9)
Anti-PR3 Antibodies: <0.2 units (ref 0.0–0.9)
Atypical P-ANCA titer: <1:20 {titer}
C-ANCA: <1:20 {titer}
P-ANCA: <1:20 {titer}

## 2021-08-07 LAB — GLUCOSE, CAPILLARY
Glucose-Capillary: 157 mg/dL — ABNORMAL HIGH (ref 70–99)
Glucose-Capillary: 157 mg/dL — ABNORMAL HIGH (ref 70–99)
Glucose-Capillary: 170 mg/dL — ABNORMAL HIGH (ref 70–99)
Glucose-Capillary: 173 mg/dL — ABNORMAL HIGH (ref 70–99)
Glucose-Capillary: 190 mg/dL — ABNORMAL HIGH (ref 70–99)
Glucose-Capillary: 191 mg/dL — ABNORMAL HIGH (ref 70–99)
Glucose-Capillary: 220 mg/dL — ABNORMAL HIGH (ref 70–99)

## 2021-08-07 MED ORDER — INSULIN GLARGINE-YFGN 100 UNIT/ML ~~LOC~~ SOLN
10.0000 [IU] | Freq: Every day | SUBCUTANEOUS | Status: DC
  Administered 2021-08-07 – 2021-08-08 (×2): 10 [IU] via SUBCUTANEOUS
  Filled 2021-08-07 (×2): qty 0.1

## 2021-08-07 MED ORDER — PANTOPRAZOLE SODIUM 40 MG PO TBEC
40.0000 mg | DELAYED_RELEASE_TABLET | Freq: Two times a day (BID) | ORAL | Status: DC
  Administered 2021-08-07 – 2021-08-08 (×2): 40 mg via ORAL
  Filled 2021-08-07 (×2): qty 1

## 2021-08-07 MED ORDER — ALTEPLASE 2 MG IJ SOLR: 2.0000 mg | Freq: Once | INTRAMUSCULAR | Status: DC | PRN

## 2021-08-07 NOTE — Progress Notes (Signed)
Patient ID: Kayla Berger, female   DOB: [birth year], 61 y.o.   MRN: [id number] [facility] Progress Note   Assessment/ Plan:   1. Acute kidney Injury: Normal baseline renal function. Renal US normal, UA c/w UTI not GN.  This appears to have been hemodynamically mediated from volume contraction in the setting of ongoing lisinopril/HCTZ use-possibly with evolution to mild ischemic ATN based on limited response to volume resuscitation.  Required RRT 7/7 x 1 for hyperK.   HD catheter was removed 7/10. Continued downtrending creatinine and UOP yesterday ~3L, normal electrolytes.   Cont to hold ACEi/diuretic for now.  2.  Hyperkalemia: resolved  3.  Intractable nausea/vomiting prior to admission: Suspected to be from Ozempic and possibly severe azotemia.  Doing better - continue symptomatic management. 4.  Metabolic alkalosis: Suspected to be secondary to volume contraction and preceding hydrochlorothiazide use, monitor with ongoing management/renal recovery.  Bicarb up a bit today - delayed excretion with AKI suspected.   5.  Hypotension: Blood pressure back to normal range-hypotension was suspected to be from significant intravascular volume contraction/hypovolemia + sepsis for Klebsiella UTI S/p ceftriaxone and weaned off of pressor.   6.  Anemia: noted down trending Hb.  Denies bleeding and no obvious etiology. Stable today ? Dilutional.  Can be f/u outpt.   I anticipate she'll be stable for d/c home tomorrow and will f/u with her next week in nephrology clinic.  I'll see her tomorrow as well.   Subjective:   Doing better - dinner last night "delish" Frontal HA noted, no other neurologic issues foley out, voiding fine Sister PCCM MD bedside.      Objective:   BP 140/87 (BP Location: Left Arm)   Pulse 90   Temp 98.4 F (36.9 C) (Oral)   Resp 17   Ht 5\' 3"  (1.6 m)   Wt 76.8 kg   LMP 10/06/2012   SpO2 96%   BMI 29.99 kg/m   Intake/Output Summary (Last 24 hours) at 08/07/2021  10/08/2021 Last data filed at 08/07/2021 0800 Gross per 24 hour  Intake 370 ml  Output 3525 ml  Net -3155 ml    Weight change:   Physical Exam: Gen: Appears comfortable resting in bed, sister at bedside   CVS: Pulse regular rhythm, normal rate, S1 and S2 normal Resp: Anteriorly clear to auscultation, no rales/rhonchi Abd: Soft, flat, nontender, bowel sounds normal Ext: No lower extremity edema  Imaging: DG ESOPHAGUS W SINGLE CM (SOL OR THIN BA)  Result Date: 08/06/2021 CLINICAL DATA:  Choking sensation with liquids and solids. Globus sensation. Regurgitation. EXAM: ESOPHAGUS/BARIUM SWALLOW/TABLET STUDY TECHNIQUE: Single contrast examination was performed using thin liquid barium and 1/2 inch barium tablet. This exam was performed by 10/07/2021, PA-C, and was supervised and interpreted by Sheliah Plane, MD. FLUOROSCOPY: Radiation Exposure Index (as provided by the fluoroscopic device): 24.10 mGy Kerma COMPARISON:  None Available. FINDINGS: Swallowing: Results in intermittent laryngeal penetration without tracheal aspiration noted. Pharynx: Unremarkable. Esophagus: Unremarkable course, caliber, and mucosa. Esophageal motility: Decreased esophageal motility with inefficient tertiary contractions and incomplete bolus clearing in greater than 2 stripping waves. Hiatal Hernia: Small, sliding-type Gastroesophageal reflux: Large volume, to the proximal esophagus Ingested 39mm barium tablet: Passed normally Other: None. IMPRESSION: 1. Severe gastroesophageal reflux. 2. Small sliding-type hiatal hernia. 3. Esophageal dysmotility. Electronically Signed   By: 19m M.D.   On: 08/06/2021 13:04    Labs: 10/07/2021 Recent Labs  Lab 08/02/21 1832 08/02/21 1848 08/02/21 2247 08/03/21 10/04/21 08/03/21 1207  08/03/21 1547 08/03/21 2320 08/04/21 0415 08/04/21 1621 08/05/21 0207 08/06/21 0304 08/07/21 0240  NA  --    < > 138 139  --   --   --  136 131* 131* 138 143  K  --    < > 5.8* 4.0   < >  5.4* 4.1 4.0 4.3 4.6 3.2* 4.4  CL  --   --  74* 88*  --   --   --  93* 89* 93* 93* 94*  CO2  --   --  33* 34*  --   --   --  32 25 25 32 34*  GLUCOSE  --   --  183* 92  --   --   --  101* 159* 119* 138* 175*  BUN  --   --  73* 41*  --   --   --  31* 34* 34* 34* 30*  CREATININE  --   --  10.23* 6.45*  --   --   --  6.97* 6.97* 7.08* 6.39* 5.14*  CALCIUM  --   --  9.7 8.8*  --   --   --  8.0* 7.7* 7.7* 8.1* 8.9  PHOS 8.0*  --  7.2*  --   --   --   --   --   --  4.7* 6.4* 4.5   < > = values in this interval not displayed.    CBC Recent Labs  Lab 08/02/21 1351 08/02/21 1848 08/03/21 0612 08/06/21 0304 08/07/21 0240 08/07/21 0825  WBC 12.5*  --  12.0* 6.8 7.0 6.8  NEUTROABS 10.0*  --   --   --   --   --   HGB 13.3   < > 11.2* 9.8* 10.0* 10.7*  HCT 40.0   < > 33.6* 28.4* 29.3* 31.2*  MCV 87.5  --  88.7 86.3 87.7 87.6  PLT 362  --  322 224 225 229   < > = values in this interval not displayed.     Medications:     aspirin  81 mg Oral q AM   atorvastatin  10 mg Oral QHS   Chlorhexidine Gluconate Cloth  6 each Topical Q0600   DULoxetine  60 mg Oral BID   feeding supplement  237 mL Oral BID BM   gabapentin  200 mg Oral QHS   heparin injection (subcutaneous)  5,000 Units Subcutaneous Q8H   insulin aspart  0-9 Units Subcutaneous Q4H   pantoprazole  40 mg Oral Daily   sodium chloride flush  10-40 mL Intracatheter Q12H   Estill Bakes MD [facility] Pager [phone]

## 2021-08-07 NOTE — Plan of Care (Signed)
  Problem: Fluid Volume: Goal: Ability to maintain a balanced intake and output will improve Outcome: Progressing   Problem: Health Behavior/Discharge Planning: Goal: Ability to manage health-related needs will improve Outcome: Progressing   Problem: Nutritional: Goal: Maintenance of adequate nutrition will improve Outcome: Progressing   

## 2021-08-07 NOTE — Progress Notes (Signed)
PROGRESS NOTE    Kayla Berger  WCH:[id number] DOB: Jul 30, 1960 DOA: 08/02/2021 PCP: Irena Reichmann, DO    Brief Narrative:  61 year old female with history of insulin-dependent diabetes on insulin and Ozempic, essential hypertension presented to the hospital with intractable nausea, vomiting for about 1 week.  In the emergency room she was found to be hypotensive, severely dehydrated with hypovolemic shock and hyperkalemic renal failure requiring urgent hemodialysis.  She received 1 dialysis sessions with good response.  Gradually improving and normalizing.   Assessment & Plan:   Acute renal failure with hyperkalemia, hypovolemic shock. Profound dehydration from vomiting in the setting of diuretics and ACE inhibitor, also using metformin. Right IJ catheter, urgent hemodialysis 7/7.  Hemodynamically stabilizing. Urine output more than 3 L / 24 hours.  Clinically improving.  BUN/creatinine is appropriately improving, however expect to improve over next several days. Nephrology following. DC Foley catheter.  DC temporary IJ catheter.  Recheck levels tomorrow morning.  Anticipate home tomorrow with outpatient nephrology follow-up.  Intractable nausea, vomiting with contraction metabolic alkalosis: Suspected due to increasing dose of Ozempic.  Clinically stabilizing.  Nausea medications.  Frequent small meals to encourage. She did complain of odynophagia dysphagia and globus sensation,  Barium esophagogram with severe reflux. All-time reflux precautions.  PPI twice daily. Elevated head of the bed.   Insulin-dependent type 2 diabetes: Recently significantly controlled after starting Ozempic, however blood sugars normal without much intervention in the hospital.  Known A1c of 6.6. Today her oral intake is improving.  Will start on some nightly 10 units.  Depends upon her blood sugars at home, she will gradually go up on summarily but she will not start her Ozempic and metformin yet.  Essential  hypertension: Yet not needing any antihypertensives.  Will avoid ACE and hydrochlorothiazide.  She may need agent like amlodipine depends upon her blood pressure tomorrow.  Headache: Symptomatic treatment.  Improved.  Acute UTI present on admission: Treated with Rocephin.  Completed therapy.  Take all catheters out.  Start long-acting insulin.  Discontinue telemetry monitor and mobilize.  Anticipate home tomorrow.   DVT prophylaxis: heparin injection 5,000 Units Start: 08/03/21 1515 SCDs Start: 08/02/21 2003   Code Status: Full code Family Communication: Patient's sister at the bedside and she is a critical care physician. Disposition Plan: Status is: Inpatient Remains inpatient appropriate because: Significant electrolyte abnormalities, renal functions recovery, poor oral intake.     Consultants:  Nephrology  Procedures:  Dialysis 7/7  Antimicrobials:  None   Subjective: Seen and examined.  No overnight events.  Appetite is improving and she was satisfied that she could eat 50% of the meal. Blood sugars are adequate and now more than 150 consistently. Able to go to the bathroom to urinate.  Urine output is 3.5 L. Sister is at the bedside. They can monitor their blood sugars and blood pressures at home.  Patient has some tremors however improving.  Headache is improving.  Objective: Vitals:   08/06/21 2020 08/06/21 2030 08/07/21 0603 08/07/21 0757  BP: 129/79 129/79 135/78 140/87  Pulse: 90 90 93 90  Resp: 19 19 17 17   Temp: 98.3 F (36.8 C) 98.3 F (36.8 C) 98 F (36.7 C) 98.4 F (36.9 C)  TempSrc:  Oral  Oral  SpO2: 94% 94% 96% 96%  Weight:      Height:        Intake/Output Summary (Last 24 hours) at 08/07/2021 1128 Last data filed at 08/07/2021 0800 Gross per 24 hour  Intake 370  ml  Output 3525 ml  Net -3155 ml   Filed Weights   08/02/21 1400 08/02/21 2236  Weight: 74.4 kg 76.8 kg    Examination:  General exam: Appears calm and comfortable   Respiratory system: No added sounds. Cardiovascular system: S1 & S2 heard, RRR. No pedal edema. Gastrointestinal system: Abdomen is nondistended, soft and nontender. No organomegaly or masses felt. Normal bowel sounds heard. Central nervous system: Alert and oriented. No focal neurological deficits. Extremities: Symmetric 5 x 5 power. Skin: No rashes, lesions or ulcers Psychiatry: Judgement and insight appear normal. Mood & affect appropriate.     Data Reviewed: I have personally reviewed following labs and imaging studies  CBC: Recent Labs  Lab 08/02/21 1351 08/02/21 1848 08/03/21 0612 08/06/21 0304 08/07/21 0240 08/07/21 0825  WBC 12.5*  --  12.0* 6.8 7.0 6.8  NEUTROABS 10.0*  --   --   --   --   --   HGB 13.3 13.6 11.2* 9.8* 10.0* 10.7*  HCT 40.0 40.0 33.6* 28.4* 29.3* 31.2*  MCV 87.5  --  88.7 86.3 87.7 87.6  PLT 362  --  322 224 225 229   Basic Metabolic Panel: Recent Labs  Lab 08/02/21 1832 08/02/21 1848 08/02/21 2247 08/03/21 0612 08/04/21 1621 08/05/21 0207 08/06/21 0304 08/07/21 0240 08/07/21 0825  NA  --    < > 138   < > 131* 131* 138 143 141  K  --    < > 5.8*   < > 4.3 4.6 3.2* 4.4 3.6  CL  --   --  74*   < > 89* 93* 93* 94* 97*  CO2  --   --  33*   < > 25 25 32 34* 28  GLUCOSE  --   --  183*   < > 159* 119* 138* 175* 165*  BUN  --   --  73*   < > 34* 34* 34* 30* 28*  CREATININE  --   --  10.23*   < > 6.97* 7.08* 6.39* 5.14* 4.69*  CALCIUM  --   --  9.7   < > 7.7* 7.7* 8.1* 8.9 8.8*  MG 2.7*  --   --   --   --   --   --   --   --   PHOS 8.0*  --  7.2*  --   --  4.7* 6.4* 4.5 4.3   < > = values in this interval not displayed.   GFR: Estimated Creatinine Clearance: 12.5 mL/min (A) (by C-G formula based on SCr of 4.69 mg/dL (H)). Liver Function Tests: Recent Labs  Lab 08/02/21 1351 08/02/21 2247 08/05/21 0207 08/06/21 0304 08/07/21 0240 08/07/21 0825  AST 21  --   --   --   --   --   ALT 24  --   --   --   --   --   ALKPHOS 73  --   --   --    --   --   BILITOT 1.0  --   --   --   --   --   PROT 7.1  --   --   --   --   --   ALBUMIN 4.1 3.5 2.6* 2.8* 3.1* 3.2*   Recent Labs  Lab 08/02/21 1351  LIPASE 45   No results for input(s): "AMMONIA" in the last 168 hours. Coagulation Profile: No results for input(s): "INR", "PROTIME" in the last 168 hours.  Cardiac Enzymes: No results for input(s): "CKTOTAL", "CKMB", "CKMBINDEX", "TROPONINI" in the last 168 hours. BNP (last 3 results) No results for input(s): "PROBNP" in the last 8760 hours. HbA1C: No results for input(s): "HGBA1C" in the last 72 hours.  CBG: Recent Labs  Lab 08/06/21 1715 08/06/21 2021 08/06/21 2359 08/07/21 0404 08/07/21 0755  GLUCAP 228* 221* 157* 173* 157*   Lipid Profile: No results for input(s): "CHOL", "HDL", "LDLCALC", "TRIG", "CHOLHDL", "LDLDIRECT" in the last 72 hours. Thyroid Function Tests: No results for input(s): "TSH", "T4TOTAL", "FREET4", "T3FREE", "THYROIDAB" in the last 72 hours. Anemia Panel: No results for input(s): "VITAMINB12", "FOLATE", "FERRITIN", "TIBC", "IRON", "RETICCTPCT" in the last 72 hours. Sepsis Labs: Recent Labs  Lab 08/02/21 1915 08/02/21 2248 08/03/21 0123 08/03/21 0940  LATICACIDVEN 6.6* 8.1* 7.2* 2.0*    Recent Results (from the past 240 hour(s))  Urine Culture     Status: Abnormal   Collection Time: 08/02/21  1:48 PM   Specimen: Urine, Clean Catch  Result Value Ref Range Status   Specimen Description URINE, CLEAN CATCH  Final   Special Requests   Final    NONE Performed at [facility], [address], [place], [place] [address]    Culture 60,000 COLONIES/mL KLEBSIELLA PNEUMONIAE (A)  Final   Report Status 08/05/2021 FINAL  Final   Organism ID, Bacteria KLEBSIELLA PNEUMONIAE (A)  Final      Susceptibility   Klebsiella pneumoniae - MIC*    AMPICILLIN RESISTANT Resistant     CEFAZOLIN <=4 SENSITIVE Sensitive     CEFEPIME <=0.12 SENSITIVE Sensitive     CEFTRIAXONE <=0.25 SENSITIVE Sensitive      CIPROFLOXACIN <=0.25 SENSITIVE Sensitive     GENTAMICIN <=1 SENSITIVE Sensitive     IMIPENEM <=0.25 SENSITIVE Sensitive     NITROFURANTOIN 64 INTERMEDIATE Intermediate     TRIMETH/SULFA <=20 SENSITIVE Sensitive     AMPICILLIN/SULBACTAM 4 SENSITIVE Sensitive     PIP/TAZO <=4 SENSITIVE Sensitive     * 60,000 COLONIES/mL KLEBSIELLA PNEUMONIAE  MRSA Next Gen by PCR, Nasal     Status: None   Collection Time: 08/03/21  1:22 AM   Specimen: Nasal Mucosa; Nasal Swab  Result Value Ref Range Status   MRSA by PCR Next Gen NOT DETECTED NOT DETECTED Final    Comment: (NOTE) The GeneXpert MRSA Assay (FDA approved for NASAL specimens only), is one component of a comprehensive MRSA colonization surveillance program. It is not intended to diagnose MRSA infection nor to guide or monitor treatment for MRSA infections. Test performance is not FDA approved in patients less than 71 years old. Performed at [facility], [address], [place], [place] [address]          Radiology Studies: DG ESOPHAGUS W SINGLE CM (SOL OR THIN BA)  Result Date: 08/06/2021 CLINICAL DATA:  Choking sensation with liquids and solids. Globus sensation. Regurgitation. EXAM: ESOPHAGUS/BARIUM SWALLOW/TABLET STUDY TECHNIQUE: Single contrast examination was performed using thin liquid barium and 1/2 inch barium tablet. This exam was performed by Sheliah Plane, PA-C, and was supervised and interpreted by Emmaline Kluver, MD. FLUOROSCOPY: Radiation Exposure Index (as provided by the fluoroscopic device): 24.10 mGy Kerma COMPARISON:  None Available. FINDINGS: Swallowing: Results in intermittent laryngeal penetration without tracheal aspiration noted. Pharynx: Unremarkable. Esophagus: Unremarkable course, caliber, and mucosa. Esophageal motility: Decreased esophageal motility with inefficient tertiary contractions and incomplete bolus clearing in greater than 2 stripping waves. Hiatal Hernia: Small, sliding-type Gastroesophageal  reflux: Large volume, to the proximal esophagus Ingested  4mm barium tablet: Passed normally Other: None. IMPRESSION: 1. Severe gastroesophageal reflux. 2. Small sliding-type hiatal hernia. 3. Esophageal dysmotility. Electronically Signed   By: Emmaline Kluver M.D.   On: 08/06/2021 13:04        Scheduled Meds:  aspirin  81 mg Oral q AM   atorvastatin  10 mg Oral QHS   Chlorhexidine Gluconate Cloth  6 each Topical Q0600   DULoxetine  60 mg Oral BID   feeding supplement  237 mL Oral BID BM   gabapentin  200 mg Oral QHS   heparin injection (subcutaneous)  5,000 Units Subcutaneous Q8H   insulin aspart  0-9 Units Subcutaneous Q4H   insulin glargine-yfgn  10 Units Subcutaneous Daily   pantoprazole  40 mg Oral BID   sodium chloride flush  10-40 mL Intracatheter Q12H   Continuous Infusions:  sodium chloride Stopped (08/04/21 0025)   anticoagulant sodium citrate       LOS: 5 days    Time spent: 35 minutes    Dorcas Carrow, MD [facility] Pager [phone]

## 2021-08-08 ENCOUNTER — Other Ambulatory Visit (HOSPITAL_COMMUNITY): Payer: Self-pay

## 2021-08-08 DIAGNOSIS — E875 Hyperkalemia: Secondary | ICD-10-CM | POA: Diagnosis not present

## 2021-08-08 DIAGNOSIS — I10 Essential (primary) hypertension: Secondary | ICD-10-CM | POA: Diagnosis not present

## 2021-08-08 DIAGNOSIS — E119 Type 2 diabetes mellitus without complications: Secondary | ICD-10-CM | POA: Diagnosis not present

## 2021-08-08 DIAGNOSIS — N171 Acute kidney failure with acute cortical necrosis: Secondary | ICD-10-CM | POA: Diagnosis not present

## 2021-08-08 LAB — PROTEIN ELECTROPHORESIS, SERUM
A/G Ratio: 1.4 (ref 0.7–1.7)
Albumin ELP: 3.4 g/dL (ref 2.9–4.4)
Alpha-1-Globulin: 0.3 g/dL (ref 0.0–0.4)
Alpha-2-Globulin: 0.9 g/dL (ref 0.4–1.0)
Beta Globulin: 0.8 g/dL (ref 0.7–1.3)
Gamma Globulin: 0.4 g/dL (ref 0.4–1.8)
Globulin, Total: 2.4 g/dL (ref 2.2–3.9)
Total Protein ELP: 5.8 g/dL — ABNORMAL LOW (ref 6.0–8.5)

## 2021-08-08 LAB — GLUCOSE, CAPILLARY
Glucose-Capillary: 141 mg/dL — ABNORMAL HIGH (ref 70–99)
Glucose-Capillary: 176 mg/dL — ABNORMAL HIGH (ref 70–99)
Glucose-Capillary: 265 mg/dL — ABNORMAL HIGH (ref 70–99)

## 2021-08-08 LAB — CBC
HCT: 27.3 % — ABNORMAL LOW (ref 36.0–46.0)
Hemoglobin: 9.2 g/dL — ABNORMAL LOW (ref 12.0–15.0)
MCH: 30 pg (ref 26.0–34.0)
MCHC: 33.7 g/dL (ref 30.0–36.0)
MCV: 88.9 fL (ref 80.0–100.0)
Platelets: 231 10*3/uL (ref 150–400)
RBC: 3.07 MIL/uL — ABNORMAL LOW (ref 3.87–5.11)
RDW: 13.2 % (ref 11.5–15.5)
WBC: 7.5 10*3/uL (ref 4.0–10.5)
nRBC: 0 % (ref 0.0–0.2)

## 2021-08-08 LAB — FERRITIN: Ferritin: 225 ng/mL (ref 11–307)

## 2021-08-08 LAB — RENAL FUNCTION PANEL
Albumin: 3 g/dL — ABNORMAL LOW (ref 3.5–5.0)
Anion gap: 13 (ref 5–15)
BUN: 27 mg/dL — ABNORMAL HIGH (ref 6–20)
CO2: 30 mmol/L (ref 22–32)
Calcium: 8.7 mg/dL — ABNORMAL LOW (ref 8.9–10.3)
Chloride: 100 mmol/L (ref 98–111)
Creatinine, Ser: 4.13 mg/dL — ABNORMAL HIGH (ref 0.44–1.00)
GFR, Estimated: 12 mL/min — ABNORMAL LOW (ref >60)
Glucose, Bld: 164 mg/dL — ABNORMAL HIGH (ref 70–99)
Phosphorus: 4.3 mg/dL (ref 2.5–4.6)
Potassium: 3.6 mmol/L (ref 3.5–5.1)
Sodium: 143 mmol/L (ref 135–145)

## 2021-08-08 LAB — IRON AND TIBC
Iron: 114 ug/dL (ref 28–170)
Saturation Ratios: 36 % — ABNORMAL HIGH (ref 10.4–31.8)
TIBC: 314 ug/dL (ref 250–450)
UIBC: 200 ug/dL

## 2021-08-08 MED ORDER — GABAPENTIN 100 MG PO CAPS
200.0000 mg | ORAL_CAPSULE | Freq: Every day | ORAL | 0 refills | Status: DC
  Filled 2021-08-08: qty 60, 30d supply, fill #0

## 2021-08-08 MED ORDER — ENSURE ENLIVE PO LIQD
237.0000 mL | Freq: Two times a day (BID) | ORAL | 12 refills | Status: DC
  Filled 2021-08-08: qty 237, 1d supply, fill #0

## 2021-08-08 MED ORDER — PANTOPRAZOLE SODIUM 40 MG PO TBEC
40.0000 mg | DELAYED_RELEASE_TABLET | Freq: Two times a day (BID) | ORAL | 0 refills | Status: AC
  Filled 2021-08-08: qty 60, 30d supply, fill #0

## 2021-08-08 MED ORDER — TOUJEO MAX SOLOSTAR 300 UNIT/ML ~~LOC~~ SOPN: 10.0000 [IU] | PEN_INJECTOR | Freq: Every day | SUBCUTANEOUS | Status: AC

## 2021-08-08 NOTE — Hospital Course (Signed)
The patient is a 61 year old female with history of insulin-dependent diabetes on insulin and Ozempic, essential hypertension presented to the hospital with intractable nausea, vomiting for about 1 week.  In the emergency room she was found to be hypotensive, severely dehydrated with hypovolemic shock and hyperkalemic renal failure requiring urgent hemodialysis.  She received 1 dialysis sessions with good response.  Gradually improving and normalizing.  Nephrology cleared the patient for discharge as she is improved significantly and will follow closely in outpatient setting.  Her insulin was adjusted and she will just be placed on long-acting insulin as well as sliding scale.  Her ACE inhibitor, diuretic, metformin and GLP-1 agonist will be held at discharge given her acute renal failure and she was told to avoid NSAIDs.  At this time she is medically stable to be discharged home with family

## 2021-08-08 NOTE — Progress Notes (Signed)
Patient is going home with her Sister for support and also lives with another Sister. Verbalized understanding of instructions and medication administration. Sister is attentive and states she will assure medications are taken correctly and that patient is safe at home.

## 2021-08-08 NOTE — Progress Notes (Signed)
Patient ID: Kayla Berger, female   DOB: Jun 27, 1960, 61 y.o.   MRN: [id number] [facility] Progress Note   Assessment/ Plan:   1. Acute kidney Injury: Normal baseline renal function. Renal US normal, UA c/w UTI not GN.  This appears to have been hemodynamically mediated from volume contraction in the setting of ongoing lisinopril/HCTZ use-possibly with evolution to mild ischemic ATN based on limited response to volume resuscitation.  Required RRT 7/7 x 1 for hyperK.   HD catheter was removed 7/10. Continued downtrending creatinine to 4.13 today and UOP yesterday ~3L, normal electrolytes.   Cont to hold ACEi/diuretic, metformin for now.  2.  Hyperkalemia: resolved  3.  Intractable nausea/vomiting prior to admission: Suspected to be from Ozempic and possibly severe azotemia. Resolved. 4.  Metabolic alkalosis: Suspected to be secondary to volume contraction and preceding hydrochlorothiazide use, monitor with ongoing management/renal recovery.  Remained elevated presumably due to delayed excretion with AKI suspected -- now downtrending 5.  Hypotension: Blood pressure back to normal range-hypotension was suspected to be from significant intravascular volume contraction/hypovolemia + sepsis for Klebsiella UTI S/p ceftriaxone and weaned off of pressor.   Has baseline BP but has been fairly normal here - cont to hold ACEi/diuretic.  6.  Anemia, normocytic: noted down trending Hb.  Denies bleeding and no obvious etiology.  Iron indices normal.  Can be f/u outpt by PCP. 7. DM: meds per primary; would hold metformin, GLP1 agonist  Ok for d/c.  Cont to hold ACEi/diuretic, metformin for now.  I'll see her 7/21 in clinic - my office will reach out.  She has my name/office number as well.   Subjective:   Feeling well and ready for d/c.  foley out x 2d, voiding fine - UOP 3.2L yesterday Older sister bedside    Objective:   BP (!) 142/87 (BP Location: Left Arm)   Pulse 84   Temp 98 F (36.7 C)  (Oral)   Resp 17   Ht 5\' 3"  (1.6 m)   Wt 76.8 kg   LMP 10/06/2012   SpO2 98%   BMI 29.99 kg/m   Intake/Output Summary (Last 24 hours) at 08/08/2021 10/09/2021 Last data filed at 08/08/2021 10/09/2021 Gross per 24 hour  Intake 352 ml  Output 2550 ml  Net -2198 ml    Weight change:   Physical Exam: Gen: Appears comfortable resting in chair CVS: Pulse regular rhythm, normal rate, S1 and S2 normal Resp: Anteriorly clear to auscultation, no rales/rhonchi Abd: Soft, flat, nontender, bowel sounds normal Ext: No lower extremity edema  Imaging: DG ESOPHAGUS W SINGLE CM (SOL OR THIN BA)  Result Date: 08/06/2021 CLINICAL DATA:  Choking sensation with liquids and solids. Globus sensation. Regurgitation. EXAM: ESOPHAGUS/BARIUM SWALLOW/TABLET STUDY TECHNIQUE: Single contrast examination was performed using thin liquid barium and 1/2 inch barium tablet. This exam was performed by 10/07/2021, PA-C, and was supervised and interpreted by Sheliah Plane, MD. FLUOROSCOPY: Radiation Exposure Index (as provided by the fluoroscopic device): 24.10 mGy Kerma COMPARISON:  None Available. FINDINGS: Swallowing: Results in intermittent laryngeal penetration without tracheal aspiration noted. Pharynx: Unremarkable. Esophagus: Unremarkable course, caliber, and mucosa. Esophageal motility: Decreased esophageal motility with inefficient tertiary contractions and incomplete bolus clearing in greater than 2 stripping waves. Hiatal Hernia: Small, sliding-type Gastroesophageal reflux: Large volume, to the proximal esophagus Ingested 7mm barium tablet: Passed normally Other: None. IMPRESSION: 1. Severe gastroesophageal reflux. 2. Small sliding-type hiatal hernia. 3. Esophageal dysmotility. Electronically Signed   By: 19m.D.  On: 08/06/2021 13:04    Labs: BMET Recent Labs  Lab 08/02/21 1832 08/02/21 1848 08/02/21 2247 08/03/21 3329 08/04/21 5188 08/04/21 1621 08/05/21 0207 08/06/21 0304 08/07/21 0240  08/07/21 0825 08/08/21 0050  NA  --    < > 138   < > 136 131* 131* 138 143 141 143  K  --    < > 5.8*   < > 4.0 4.3 4.6 3.2* 4.4 3.6 3.6  CL  --   --  74*   < > 93* 89* 93* 93* 94* 97* 100  CO2  --   --  33*   < > 32 25 25 32 34* 28 30  GLUCOSE  --   --  183*   < > 101* 159* 119* 138* 175* 165* 164*  BUN  --   --  73*   < > 31* 34* 34* 34* 30* 28* 27*  CREATININE  --   --  10.23*   < > 6.97* 6.97* 7.08* 6.39* 5.14* 4.69* 4.13*  CALCIUM  --   --  9.7   < > 8.0* 7.7* 7.7* 8.1* 8.9 8.8* 8.7*  PHOS 8.0*  --  7.2*  --   --   --  4.7* 6.4* 4.5 4.3 4.3   < > = values in this interval not displayed.    CBC Recent Labs  Lab 08/02/21 1351 08/02/21 1848 08/06/21 0304 08/07/21 0240 08/07/21 0825 08/08/21 0050  WBC 12.5*   < > 6.8 7.0 6.8 7.5  NEUTROABS 10.0*  --   --   --   --   --   HGB 13.3   < > 9.8* 10.0* 10.7* 9.2*  HCT 40.0   < > 28.4* 29.3* 31.2* 27.3*  MCV 87.5   < > 86.3 87.7 87.6 88.9  PLT 362   < > 224 225 229 231   < > = values in this interval not displayed.     Medications:     aspirin  81 mg Oral q AM   atorvastatin  10 mg Oral QHS   Chlorhexidine Gluconate Cloth  6 each Topical Q0600   DULoxetine  60 mg Oral BID   feeding supplement  237 mL Oral BID BM   gabapentin  200 mg Oral QHS   heparin injection (subcutaneous)  5,000 Units Subcutaneous Q8H   insulin aspart  0-9 Units Subcutaneous Q4H   insulin glargine-yfgn  10 Units Subcutaneous Daily   pantoprazole  40 mg Oral BID   sodium chloride flush  10-40 mL Intracatheter Q12H   Estill Bakes MD [facility] Pager [phone]

## 2021-08-08 NOTE — Discharge Summary (Addendum)
Physician Discharge Summary   Patient: Kayla Berger MRN: [id number] DOB: Mar 24, 1960  Admit date:     08/02/2021  Discharge date: 08/08/21  Discharge Physician: Raiford Noble, DO   PCP: Janie Morning, DO   Recommendations at discharge:   Follow-up with PCP within 1 to 2 weeks and repeat CBC, CMP, mag, Phos within 1 week Follow-up with nephrology within 1 to 2 weeks and appointment scheduled for next Friday with Dr. Johnney Ou  Discharge Diagnoses: Principal Problem:   Acute kidney failure ([place]) Active Problems:   Hyperkalemia, diminished renal excretion   DM2 (diabetes mellitus, type 2) (HCC)   Lactic acidosis   HTN (hypertension)   Malnutrition of moderate degree  Resolved Problems:   * No resolved hospital problems. [facility] Hospital Course: The patient is a 61 year old female with history of insulin-dependent diabetes on insulin and Ozempic, essential hypertension presented to the hospital with intractable nausea, vomiting for about 1 week.  In the emergency room she was found to be hypotensive, severely dehydrated with hypovolemic shock and hyperkalemic renal failure requiring urgent hemodialysis.  She received 1 dialysis sessions with good response.  Gradually improving and normalizing.  Nephrology cleared the patient for discharge as she is improved significantly and will follow closely in outpatient setting.  Her insulin was adjusted and she will just be placed on long-acting insulin as well as sliding scale.  Her ACE inhibitor, diuretic, metformin and GLP-1 agonist will be held at discharge given her acute renal failure and she was told to avoid NSAIDs.  At this time she is medically stable to be discharged home with family  Assessment and Plan: Acute renal failure with hyperkalemia, hypovolemic shock. -Profound dehydration from vomiting in the setting of diuretics and ACE inhibitor, also using metformin. -Right IJ catheter, urgent hemodialysis 7/7.  Hemodynamically  stabilizing. -Urine output more than 3 L / 24 hours.  Clinically improving.  -BUN/creatinine is appropriately improving, however expect to improve over next several days. -Nephrology following. -DC Foley catheter.  DC temporary IJ catheter.  Recheck levels today showed some slight improvement and now BUNs/creatinine is 27/4.13 at the time of discharge.  Anticipate home today with outpatient nephrology follow-up.   Intractable nausea, vomiting with contraction metabolic alkalosis -Suspected due to increasing dose of Ozempic.  Clinically stabilizing.  Nausea medications.  Frequent small meals to encourage. She did complain of odynophagia dysphagia and globus sensation,  Barium esophagogram with severe reflux. All-time reflux precautions.  PPI twice daily. Elevated head of the bed.    Insulin-dependent type 2 diabetes:  -Recently significantly controlled after starting Ozempic, however blood sugars normal without much intervention in the hospital.  Known A1c of 6.6. Today her oral intake is improving.  Will start on some nightly 10 units.  Depends upon her blood sugars at home, she will gradually go up on summarily but she will not start her Ozempic and metformin yet.   Essential hypertension -Yet not needing any antihypertensives.  Will avoid ACE and hydrochlorothiazide.  She may need agent like amlodipine depends upon her blood pressure will defer to PCP to further initiate given that is stable at 142/87   Headache -Symptomatic treatment.  Waxing and waning and takes Excedrin Migraine in outpatient setting which she can resume at discharge.   Acute UTI present on admission: -Treated with Rocephin.  Completed therapy.  Normocytic anemia -Patient's hemoglobin/hematocrit went from 10.7/31.2 is now 9.2/27.3 with no signs or symptoms of bleeding -Iron level is 114, U IBC is 200, TIBC was  314, saturation ratios were 36%, ferritin level was 225 -To monitor trend and repeat CBC within 1  week  Hypoalbuminemia -Mild and patient's albumin level went from 3.2 is now 3.0 -Continue monitor and trend and repeat CMP within 1 week  Moderate Malnutrition Nutrition Documentation    Flowsheet Row ED to Hosp-Admission (Discharged) from 08/02/2021 in [facility]  Nutrition Problem Moderate Malnutrition  Etiology chronic illness  [chronic decreased appetite and nausea associated with Ozempic use]  Nutrition Goal Patient will meet greater than or equal to 90% of their needs  Interventions Ensure Enlive (each supplement provides 350kcal and 20 grams of protein), Education     Nutrition Problem: Moderate Malnutrition Etiology: chronic illness (chronic decreased appetite and nausea associated with Ozempic use) Signs/Symptoms: mild muscle depletion, energy intake < 75% for > or equal to 1 month Interventions: Ensure Enlive (each supplement provides 350kcal and 20 grams of protein), Education    Consultants: Nephrology Procedures performed: Dialysis 08/03/21  Disposition: Home Diet recommendation:  Discharge Diet Orders (From admission, onward)     Start     Ordered   08/08/21 0000  Diet - low sodium heart healthy        08/08/21 1121   08/08/21 0000  Diet Carb Modified        08/08/21 1121           Cardiac and Carb modified diet DISCHARGE MEDICATION: Allergies as of 08/08/2021       Reactions   Hydrocodone Itching, Swelling, Other (See Comments)   Numbness of the face/hands and tingling also        Medication List     STOP taking these medications    gabapentin 800 MG tablet Commonly known as: NEURONTIN Replaced by: gabapentin 100 MG capsule   lisinopril-hydrochlorothiazide 10-12.5 MG tablet Commonly known as: ZESTORETIC   metFORMIN 1000 MG tablet Commonly known as: GLUCOPHAGE   Ozempic (0.25 or 0.5 MG/DOSE) 2 MG/3ML Sopn Generic drug: Semaglutide(0.25 or 0.5MG /DOS)       TAKE these medications    Alpha Lipoic  Acid 200 MG Caps Take 200 mg by mouth at bedtime.   aspirin 81 MG chewable tablet Chew 81 mg by mouth in the morning.   atorvastatin 10 MG tablet Commonly known as: LIPITOR Take 10 mg by mouth at bedtime.   Biotin 54270 MCG Tabs Take 10,000 mcg by mouth daily.   Centrum Women Tabs Take 1 tablet by mouth daily with breakfast.   cetirizine 10 MG tablet Commonly known as: ZYRTEC Take 10 mg by mouth in the morning.   CRANBERRY PO Take 15,000 mg by mouth in the morning.   DULoxetine 60 MG capsule Commonly known as: CYMBALTA Take 60 mg by mouth 2 (two) times daily.   Excedrin Migraine 250-250-65 MG tablet Generic drug: aspirin-acetaminophen-caffeine Take 1 tablet by mouth every 6 (six) hours as needed for headache or migraine.   feeding supplement Liqd Take 237 mLs by mouth 2 (two) times daily between meals.   gabapentin 100 MG capsule Commonly known as: NEURONTIN Take 2 capsules (200 mg total) by mouth at bedtime. Replaces: gabapentin 800 MG tablet   HumaLOG KwikPen 100 UNIT/ML KwikPen Generic drug: insulin lispro Inject 4-12 Units into the skin 3 (three) times daily as needed (for a BGL greater than 100).   ondansetron 4 MG tablet Commonly known as: Zofran Take 1 tablet (4 mg total) by mouth every 6 (six) hours as needed for nausea or vomiting.  pantoprazole 40 MG tablet Commonly known as: PROTONIX Take 1 tablet (40 mg total) by mouth 2 (two) times daily.   Teriparatide (Recombinant) 620 MCG/2.48ML Sopn Inject 20 mcg into the skin at bedtime.   Toujeo Max SoloStar 300 UNIT/ML Solostar Pen Generic drug: insulin glargine (2 Unit Dial) Inject 10 Units into the skin at bedtime. What changed: how much to take   traMADol 50 MG tablet Commonly known as: ULTRAM Take 50 mg by mouth every 6 (six) hours as needed (for pain).   vitamin C 1000 MG tablet Take 1,000 mg by mouth daily.   Vitamin D3 50 MCG (2000 UT) Tabs Take 2,000 Units by mouth 2 (two) times daily.    zinc gluconate 50 MG tablet Take 50 mg by mouth in the morning.        Follow-up Information     Janie Morning, DO Follow up.   Specialty: Family Medicine Contact information: [address] [place]  [address] [phone]                Discharge Exam: Danley Danker Weights   08/02/21 1400 08/02/21 2236  Weight: 74.4 kg 76.8 kg   Vitals:   08/08/21 0558 08/08/21 0733  BP: 123/86 (!) 142/87  Pulse: 86 84  Resp: 17 17  Temp:  98 F (36.7 C)  SpO2: 92% 98%   Examination: Physical Exam:  Constitutional: WN/WD overweight Caucasian female currently no acute distress appears calm and comfortable Respiratory: Diminished to auscultation bilaterally, no wheezing, rales, rhonchi or crackles. Normal respiratory effort and patient is not tachypenic. No accessory muscle use.  Unlabored breathing Cardiovascular: RRR, no murmurs / rubs / gallops. S1 and S2 auscultated.  Abdomen: Soft, non-tender, distended secondary body habitus. Bowel sounds positive.  GU: Deferred. Musculoskeletal: No clubbing / cyanosis of digits/nails. No joint deformity upper and lower extremities.  Skin: No rashes, lesions, ulcers on limited skin evaluation. No induration; Warm and dry.  Neurologic: CN 2-12 grossly intact with no focal deficits. Romberg sign and cerebellar reflexes not assessed.  Psychiatric: Normal judgment and insight. Alert and oriented x 3. Normal mood and appropriate affect.   Condition at discharge: stable  The results of significant diagnostics from this hospitalization (including imaging, microbiology, ancillary and laboratory) are listed below for reference.   Imaging Studies: DG ESOPHAGUS W SINGLE CM (SOL OR THIN BA)  Result Date: 08/06/2021 CLINICAL DATA:  Choking sensation with liquids and solids. Globus sensation. Regurgitation. EXAM: ESOPHAGUS/BARIUM SWALLOW/TABLET STUDY TECHNIQUE: Single contrast examination was performed using thin liquid barium and 1/2 inch  barium tablet. This exam was performed by Pasty Spillers, PA-C, and was supervised and interpreted by Audie Pinto, MD. FLUOROSCOPY: Radiation Exposure Index (as provided by the fluoroscopic device): 24.10 mGy Kerma COMPARISON:  None Available. FINDINGS: Swallowing: Results in intermittent laryngeal penetration without tracheal aspiration noted. Pharynx: Unremarkable. Esophagus: Unremarkable course, caliber, and mucosa. Esophageal motility: Decreased esophageal motility with inefficient tertiary contractions and incomplete bolus clearing in greater than 2 stripping waves. Hiatal Hernia: Small, sliding-type Gastroesophageal reflux: Large volume, to the proximal esophagus Ingested 53mm barium tablet: Passed normally Other: None. IMPRESSION: 1. Severe gastroesophageal reflux. 2. Small sliding-type hiatal hernia. 3. Esophageal dysmotility. Electronically Signed   By: Audie Pinto M.D.   On: 08/06/2021 13:04   DG CHEST PORT 1 VIEW  Result Date: 08/03/2021 CLINICAL DATA:  Status post central line placement EXAM: PORTABLE CHEST 1 VIEW COMPARISON:  08/02/2021 FINDINGS: Cardiac shadow is within normal limits. Right jugular temporary dialysis catheter is  noted in satisfactory position. No pneumothorax is seen. The lungs are clear. No bony abnormality is noted. IMPRESSION: No pneumothorax following central line placement. Electronically Signed   By: Inez Catalina M.D.   On: 08/03/2021 03:55   CT Head Wo Contrast  Result Date: 08/02/2021 CLINICAL DATA:  Head trauma EXAM: CT HEAD WITHOUT CONTRAST TECHNIQUE: Contiguous axial images were obtained from the base of the skull through the vertex without intravenous contrast. RADIATION DOSE REDUCTION: This exam was performed according to the departmental dose-optimization program which includes automated exposure control, adjustment of the mA and/or kV according to patient size and/or use of iterative reconstruction technique. COMPARISON:  None Available. FINDINGS:  Brain: There is no mass, hemorrhage or extra-axial collection. The size and configuration of the ventricles and extra-axial CSF spaces are normal. The brain parenchyma is normal, without acute or chronic infarction. Vascular: No abnormal hyperdensity of the major intracranial arteries or dural venous sinuses. No intracranial atherosclerosis. Skull: The visualized skull base, calvarium and extracranial soft tissues are normal. Sinuses/Orbits: No fluid levels or advanced mucosal thickening of the visualized paranasal sinuses. No mastoid or middle ear effusion. The orbits are normal. IMPRESSION: Normal head CT. Electronically Signed   By: Ulyses Jarred M.D.   On: 08/02/2021 19:14   US Renal  Result Date: 08/02/2021 CLINICAL DATA:  Kidney failure. EXAM: RENAL / URINARY TRACT ULTRASOUND COMPLETE COMPARISON:  None Available. FINDINGS: Right Kidney: Renal measurements: 9.8 x 5.1 x 5.0 cm = volume: 131.9 mL. Normal renal cortical thickness and echogenicity without focal lesions or hydronephrosis. Left Kidney: Renal measurements: 10.3 x 5.1 x 4.6 cm = volume: 126.9 mL. Normal renal cortical thickness and echogenicity without focal lesions or hydronephrosis. Bladder: Appears normal for degree of bladder distention. Other: None. IMPRESSION: Normal renal ultrasound examination. Electronically Signed   By: Marijo Sanes M.D.   On: 08/02/2021 18:21   DG Chest 2 View  Result Date: 08/02/2021 CLINICAL DATA:  Reflux. EXAM: CHEST - 2 VIEW COMPARISON:  11/08/2009 FINDINGS: The heart size and mediastinal contours are within normal limits. Both lungs are clear. The visualized skeletal structures are unremarkable. IMPRESSION: No active cardiopulmonary disease. Electronically Signed   By: Franchot Gallo M.D.   On: 08/02/2021 15:12    Microbiology: Results for orders placed or performed during the hospital encounter of 08/02/21  Urine Culture     Status: Abnormal   Collection Time: 08/02/21  1:48 PM   Specimen: Urine, Clean  Catch  Result Value Ref Range Status   Specimen Description URINE, CLEAN CATCH  Final   Special Requests   Final    NONE Performed at [facility], [place] [address] [place], [place] [address]    Culture 60,000 COLONIES/mL KLEBSIELLA PNEUMONIAE (A)  Final   Report Status 08/05/2021 FINAL  Final   Organism ID, Bacteria KLEBSIELLA PNEUMONIAE (A)  Final      Susceptibility   Klebsiella pneumoniae - MIC*    AMPICILLIN RESISTANT Resistant     CEFAZOLIN <=4 SENSITIVE Sensitive     CEFEPIME <=0.12 SENSITIVE Sensitive     CEFTRIAXONE <=0.25 SENSITIVE Sensitive     CIPROFLOXACIN <=0.25 SENSITIVE Sensitive     GENTAMICIN <=1 SENSITIVE Sensitive     IMIPENEM <=0.25 SENSITIVE Sensitive     NITROFURANTOIN 64 INTERMEDIATE Intermediate     TRIMETH/SULFA <=20 SENSITIVE Sensitive     AMPICILLIN/SULBACTAM 4 SENSITIVE Sensitive     PIP/TAZO <=4 SENSITIVE Sensitive     * 60,000 COLONIES/mL KLEBSIELLA PNEUMONIAE  MRSA  Next Gen by PCR, Nasal     Status: None   Collection Time: 08/03/21  1:22 AM   Specimen: Nasal Mucosa; Nasal Swab  Result Value Ref Range Status   MRSA by PCR Next Gen NOT DETECTED NOT DETECTED Final    Comment: (NOTE) The GeneXpert MRSA Assay (FDA approved for NASAL specimens only), is one component of a comprehensive MRSA colonization surveillance program. It is not intended to diagnose MRSA infection nor to guide or monitor treatment for MRSA infections. Test performance is not FDA approved in patients less than 38 years old. Performed at [facility], [address], [place], [place] [address]     Labs: CBC: Recent Labs  Lab 08/02/21 1351 08/02/21 1848 08/03/21 0612 08/06/21 0304 08/07/21 0240 08/07/21 0825 08/08/21 0050  WBC 12.5*  --  12.0* 6.8 7.0 6.8 7.5  NEUTROABS 10.0*  --   --   --   --   --   --   HGB 13.3   < > 11.2* 9.8* 10.0* 10.7* 9.2*  HCT 40.0   < > 33.6* 28.4* 29.3* 31.2* 27.3*  MCV 87.5  --  88.7 86.3 87.7 87.6 88.9  PLT 362  --  322 224  225 229 231   < > = values in this interval not displayed.   Basic Metabolic Panel: Recent Labs  Lab 08/02/21 1832 08/02/21 1848 08/05/21 0207 08/06/21 0304 08/07/21 0240 08/07/21 0825 08/08/21 0050  NA  --    < > 131* 138 143 141 143  K  --    < > 4.6 3.2* 4.4 3.6 3.6  CL  --    < > 93* 93* 94* 97* 100  CO2  --    < > 25 32 34* 28 30  GLUCOSE  --    < > 119* 138* 175* 165* 164*  BUN  --    < > 34* 34* 30* 28* 27*  CREATININE  --    < > 7.08* 6.39* 5.14* 4.69* 4.13*  CALCIUM  --    < > 7.7* 8.1* 8.9 8.8* 8.7*  MG 2.7*  --   --   --   --   --   --   PHOS 8.0*   < > 4.7* 6.4* 4.5 4.3 4.3   < > = values in this interval not displayed.   Liver Function Tests: Recent Labs  Lab 08/02/21 1351 08/02/21 2247 08/05/21 0207 08/06/21 0304 08/07/21 0240 08/07/21 0825 08/08/21 0050  AST 21  --   --   --   --   --   --   ALT 24  --   --   --   --   --   --   ALKPHOS 73  --   --   --   --   --   --   BILITOT 1.0  --   --   --   --   --   --   PROT 7.1  --   --   --   --   --   --   ALBUMIN 4.1   < > 2.6* 2.8* 3.1* 3.2* 3.0*   < > = values in this interval not displayed.   CBG: Recent Labs  Lab 08/07/21 2026 08/07/21 2346 08/08/21 0603 08/08/21 0741 08/08/21 1127  GLUCAP 170* 191* 141* 176* 265*   Discharge time spent: greater than 30 minutes.  Signed: Marguerita Merles, DO [facility] 08/08/2021

## 2021-08-17 ENCOUNTER — Other Ambulatory Visit: Payer: Self-pay

## 2021-08-17 ENCOUNTER — Telehealth: Payer: Self-pay | Admitting: Cardiology

## 2021-08-17 ENCOUNTER — Emergency Department (HOSPITAL_COMMUNITY)
Admission: EM | Admit: 2021-08-17 | Discharge: 2021-08-17 | Disposition: A | Discharge status: Home / Self Care | Payer: 59 | Attending: Emergency Medicine | Admitting: Emergency Medicine

## 2021-08-17 ENCOUNTER — Encounter (HOSPITAL_COMMUNITY): Payer: Self-pay

## 2021-08-17 ENCOUNTER — Encounter: Payer: Self-pay | Admitting: Cardiology

## 2021-08-17 ENCOUNTER — Emergency Department (HOSPITAL_COMMUNITY): Payer: 59

## 2021-08-17 DIAGNOSIS — R000 Tachycardia, unspecified: Secondary | ICD-10-CM | POA: Diagnosis present

## 2021-08-17 DIAGNOSIS — Z7982 Long term (current) use of aspirin: Secondary | ICD-10-CM | POA: Diagnosis not present

## 2021-08-17 DIAGNOSIS — I959 Hypotension, unspecified: Secondary | ICD-10-CM | POA: Diagnosis not present

## 2021-08-17 HISTORY — DX: Disorder of kidney and ureter, unspecified: N28.9

## 2021-08-17 LAB — CBC
HCT: 35.4 % — ABNORMAL LOW (ref 36.0–46.0)
Hemoglobin: 11.6 g/dL — ABNORMAL LOW (ref 12.0–15.0)
MCH: 30.1 pg (ref 26.0–34.0)
MCHC: 32.8 g/dL (ref 30.0–36.0)
MCV: 91.9 fL (ref 80.0–100.0)
Platelets: 365 10*3/uL (ref 150–400)
RBC: 3.85 MIL/uL — ABNORMAL LOW (ref 3.87–5.11)
RDW: 15.2 % (ref 11.5–15.5)
WBC: 11.3 10*3/uL — ABNORMAL HIGH (ref 4.0–10.5)
nRBC: 0 % (ref 0.0–0.2)

## 2021-08-17 LAB — TROPONIN I (HIGH SENSITIVITY): Troponin I (High Sensitivity): 4 ng/L (ref <18)

## 2021-08-17 LAB — BASIC METABOLIC PANEL
Anion gap: 10 (ref 5–15)
BUN: 46 mg/dL — ABNORMAL HIGH (ref 6–20)
CO2: 25 mmol/L (ref 22–32)
Calcium: 10.1 mg/dL (ref 8.9–10.3)
Chloride: 108 mmol/L (ref 98–111)
Creatinine, Ser: 1.46 mg/dL — ABNORMAL HIGH (ref 0.44–1.00)
GFR, Estimated: 41 mL/min — ABNORMAL LOW (ref >60)
Glucose, Bld: 151 mg/dL — ABNORMAL HIGH (ref 70–99)
Potassium: 4.1 mmol/L (ref 3.5–5.1)
Sodium: 143 mmol/L (ref 135–145)

## 2021-08-17 LAB — PHOSPHORUS: Phosphorus: 4.6 mg/dL (ref 2.5–4.6)

## 2021-08-17 LAB — TSH: TSH: 3.256 u[IU]/mL (ref 0.350–4.500)

## 2021-08-17 LAB — MAGNESIUM: Magnesium: 1.9 mg/dL (ref 1.7–2.4)

## 2021-08-17 MED ORDER — SODIUM CHLORIDE 0.9 % IV BOLUS
500.0000 mL | Freq: Once | INTRAVENOUS | Status: AC
  Administered 2021-08-17: 500 mL via INTRAVENOUS

## 2021-08-17 MED ORDER — METOPROLOL TARTRATE 5 MG/5ML IV SOLN
2.5000 mg | Freq: Once | INTRAVENOUS | Status: AC
  Administered 2021-08-17: 2.5 mg via INTRAVENOUS
  Filled 2021-08-17: qty 5

## 2021-08-17 MED ORDER — METOPROLOL TARTRATE 25 MG PO TABS
25.0000 mg | ORAL_TABLET | Freq: Once | ORAL | Status: AC
  Administered 2021-08-17: 25 mg via ORAL
  Filled 2021-08-17: qty 1

## 2021-08-17 MED ORDER — METOPROLOL TARTRATE 25 MG PO TABS: 25.0000 mg | ORAL_TABLET | Freq: Two times a day (BID) | ORAL | 0 refills | Status: DC

## 2021-08-17 NOTE — Discharge Instructions (Signed)
1.  Dr.Ganji will see you in follow-up in his office.  Contact information included in your discharge instructions.  You should be be called by the office soon. 2.  Start taking metoprolol 25 mg twice a day as prescribed.  Monitor your blood pressure and heart rate.  If you are lightheaded or dizzy sit or lie down immediately and elevate your legs.  If your blood pressure is less than 100 or you are feeling lightheaded, skip a dose of metoprolol. 3.  Return to the emergency department if you have new concerning or worsening symptoms. 4.  Stay well-hydrated and be very careful in the heat with any exertion.

## 2021-08-17 NOTE — ED Provider Notes (Signed)
[facility]-EMERGENCY DEPT Provider Note   CSN: [id number] Arrival date & time: 08/17/21  1328     History {Add pertinent medical, surgical, social history, OB history to HPI:1} Chief Complaint  Patient presents with  . Tachycardia  . Dizziness  . Hypotension  . Headache    Kayla Berger is a 61 y.o. female.  HPI Patient was hospitalized for acute kidney failure attributed to Ozempic use for approximately a month.  She was discharged 7\12\2023.  Patient had to undergo 1 episode of dialysis.  Patient was discharged with improving renal function.  She reports she has been very careful to follow all instructions with hydration, diet and medications.  Patient reports over the past few days she has had elevated heart rate.  She has been monitoring this and heart rates have been getting up to the 120s and sometimes up to the 140s.  She was not having any associated chest pain.  Patient has been maintaining light exercise with walking.  She does report sometimes she would feel lightheaded and also noted that blood pressures had gotten low.  Patient was seen by nephrology in follow-up today and elevated heart rate was identified.  They were unable to do EKG but were concerned for possible atrial fibrillation and referred patient to the emergency department for further evaluation.    Home Medications Prior to Admission medications   Medication Sig Start Date End Date Taking? Authorizing Provider  Alpha Lipoic Acid 200 MG CAPS Take 200 mg by mouth at bedtime.   Yes [provider]  Ascorbic Acid (VITAMIN C) 1000 MG tablet Take 1,000 mg by mouth daily.   Yes [provider]  aspirin 81 MG chewable tablet Chew 81 mg by mouth in the morning.   Yes [provider]  atorvastatin (LIPITOR) 10 MG tablet Take 10 mg by mouth at bedtime.   Yes [provider]  Biotin 53664 MCG TABS Take 10,000 mcg by mouth every evening.   Yes [provider]  cetirizine (ZYRTEC) 10 MG tablet Take 10 mg by mouth in the morning.   Yes [provider]  Cholecalciferol (VITAMIN D3) 50 MCG (2000 UT) TABS Take 2,000 Units by mouth 2 (two) times daily.   Yes [provider]  CRANBERRY PO Take 15,000 mg by mouth in the morning.   Yes [provider]  DULoxetine (CYMBALTA) 60 MG capsule Take 60 mg by mouth 2 (two) times daily.   Yes [provider]  EXCEDRIN MIGRAINE [phone] MG tablet Take 1 tablet by mouth every 6 (six) hours as needed for headache or migraine.   Yes [provider]  feeding supplement (ENSURE ENLIVE / ENSURE PLUS) LIQD Take 237 mLs by mouth 2 (two) times daily between meals. 08/08/21  Yes Sheikh, Omair Latif, DO  gabapentin (NEURONTIN) 800 MG tablet Take 400 mg by mouth at bedtime. 08/15/21  Yes [provider]  HUMALOG KWIKPEN 100 UNIT/ML KwikPen Inject 4-12 Units into the skin 3 (three) times daily as needed (for a BGL greater than 100).   Yes [provider]  insulin glargine, 2 Unit Dial, (TOUJEO MAX SOLOSTAR) 300 UNIT/ML Solostar Pen Inject 10 Units into the skin at bedtime. Patient taking differently: Inject 38 Units into the skin at bedtime. 08/08/21  Yes Sheikh, Omair Latif, DO  metoprolol tartrate (LOPRESSOR) 25 MG tablet Take 1 tablet (25 mg total) by mouth 2 (two) times daily. 08/17/21  Yes Arby Barrette, MD  Multiple Vitamins-Minerals (CENTRUM  WOMEN) TABS Take 1 tablet by mouth daily with breakfast.   Yes [provider]  pantoprazole (PROTONIX) 40 MG tablet Take 1 tablet (40 mg total) by mouth 2 (two) times daily. 08/08/21  Yes Sheikh, Omair Latif, DO  Teriparatide, Recombinant, 620 MCG/2.48ML SOPN Inject 20 mcg into the skin at bedtime. 09/21/20  Yes [provider]  zinc gluconate 50 MG tablet Take 50 mg by mouth in the morning.   Yes [provider]  lisinopril-hydrochlorothiazide (ZESTORETIC) 10-12.5 MG tablet Take 1 tablet by mouth  daily. 08/15/21   [provider]  ondansetron (ZOFRAN) 4 MG tablet Take 1 tablet (4 mg total) by mouth every 6 (six) hours as needed for nausea or vomiting. Patient not taking: Reported on 08/17/2021 02/16/21   Dwan Bolt, MD      Allergies    Hydrocodone    Review of Systems   Review of Systems 10 systems reviewed negative except as per HPI Physical Exam Updated Vital Signs BP 110/76   Pulse (!) 115   Temp 98.1 F (36.7 C) (Oral)   Resp 15   Ht 5\' 3"  (1.6 m)   Wt 76.2 kg   LMP 10/06/2012   SpO2 100%   BMI 29.76 kg/m  Physical Exam Constitutional:      Comments: Alert nontoxic well in appearance.  No respiratory distress.  HENT:     Head: Normocephalic and atraumatic.     Mouth/Throat:     Mouth: Mucous membranes are moist.     Pharynx: Oropharynx is clear.  Eyes:     Extraocular Movements: Extraocular movements intact.     Conjunctiva/sclera: Conjunctivae normal.  Neck:     Comments: No JVD Cardiovascular:     Rate and Rhythm: Regular rhythm. Tachycardia present.     Comments: Tachycardia but no significant appreciable rub murmur gallop Pulmonary:     Effort: Pulmonary effort is normal.     Breath sounds: Normal breath sounds.  Abdominal:     General: There is no distension.     Palpations: Abdomen is soft.     Tenderness: There is no abdominal tenderness. There is no guarding.  Musculoskeletal:        General: No swelling or tenderness.     Right lower leg: No edema.     Left lower leg: No edema.     Comments: Lower extremities in very good condition.  Calves are soft and pliable.  No peripheral edema.  Skin condition very good  Skin:    General: Skin is warm and dry.  Neurological:     General: No focal deficit present.     Mental Status: She is oriented to person, place, and time.     Motor: No weakness.     Coordination: Coordination normal.  Psychiatric:        Mood and Affect: Mood normal.    ED Results / Procedures / Treatments    Labs (all labs ordered are listed, but only abnormal results are displayed) Labs Reviewed  BASIC METABOLIC PANEL - Abnormal; Notable for the following components:      Result Value   Glucose, Bld 151 (*)    BUN 46 (*)    Creatinine, Ser 1.46 (*)    GFR, Estimated 41 (*)    All other components within normal limits  CBC - Abnormal; Notable for the following components:   WBC 11.3 (*)    RBC 3.85 (*)    Hemoglobin 11.6 (*)  HCT 35.4 (*)    All other components within normal limits  TSH  MAGNESIUM  PHOSPHORUS  T4  TROPONIN I (HIGH SENSITIVITY)  TROPONIN I (HIGH SENSITIVITY)    EKG EKG Interpretation  Date/Time:  Friday August 17 2021 13:41:14 EDT Ventricular Rate:  143 PR Interval:  133 QRS Duration: 65 QT Interval:  267 QTC Calculation: 412 R Axis:   27 Text Interpretation: Sinus tachycardia Low voltage, extremity leads Baseline wander in lead(s) V3 agree, no sig change from previous Confirmed by Arby Barrette [phone]) on 08/17/2021 2:06:13 PM  Radiology DG Chest 2 View  Result Date: 08/17/2021 CLINICAL DATA:  Tachycardia EXAM: CHEST - 2 VIEW COMPARISON:  August 02, 2021 FINDINGS: The heart size and mediastinal contours are within normal limits. Both lungs are clear and stable. The visualized skeletal structures are unremarkable. IMPRESSION: No active cardiopulmonary disease. Electronically Signed   By: Marjo Bicker M.D.   On: 08/17/2021 14:26    Procedures Procedures  {Document cardiac monitor, telemetry assessment procedure when appropriate:1}  Medications Ordered in ED Medications  metoprolol tartrate (LOPRESSOR) tablet 25 mg (has no administration in time range)  sodium chloride 0.9 % bolus 500 mL (500 mLs Intravenous New Bag/Given 08/17/21 1532)  sodium chloride 0.9 % bolus 500 mL (500 mLs Intravenous New Bag/Given 08/17/21 1617)  metoprolol tartrate (LOPRESSOR) injection 2.5 mg (2.5 mg Intravenous Given 08/17/21 1644)    ED Course/ Medical Decision Making/ A&P                            Medical Decision Making Amount and/or Complexity of Data Reviewed Labs: ordered. Radiology: ordered.  Risk Prescription drug management.  Patient presents as outlined.  She is recently discharged after an episode of acute renal failure ostensibly combination of dehydration and Ozempic use.  She had recovered renal function and by review of EMR discharged with creatinine of 4.13 and BUN of 27.  Patient has noted tachycardia with some symptoms of lightheadedness but no chest pain or syncope.  This has been present now for 3 days or more.  We will proceed with diagnostic evaluation including EKG, cardiac labs, complete metabolic panel, CBC and imaging with chest x-ray.   Patient's clinical appearance on first assessment is stable.  Monitor interpretation at bedside shows a sinus tachycardia rates 110s to 130s.  Narrow complex and regular.  Treatment will include fluid resuscitation and observation.  Upon first evaluation, patient does not need immediate intervention of rate control or respiratory support.  Chest x-ray 2 views reviewed and examined by myself, does not show any mediastinal widening or fluid volume overload.  Radiology review  Consult: Reviewed with Dr. Melanee Spry nephrology.  We reviewed the patient's history and current lab work.  He advises if cardiology feels that initiating metoprolol is appropriate, there are no issues from perspective of nephrology.  Consult: Reviewed with Dr. Jacinto Halim.  We reviewed patient's EKGs and at this time most likely sinus tachycardia although possible atrial tachycardia.  Patient has been stable with this for a number of days if not weeks.  Plan will be to start a dose of metoprolol in the emergency department and follow-up in the office for continued diagnostic evaluation and monitoring  Discharge patient is alert and well in appearance.  Heart rate is down to 115 and she is tolerating this well.  Blood pressures are currently in  the 120s over 70s.  We have extensively reviewed the plan for initiating Toprol  and monitoring vital signs at home.  Patient lives with her sister who is available for help and supervision.  {Document critical care time when appropriate:1} {Document review of labs and clinical decision tools ie heart score, Chads2Vasc2 etc:1}  {Document your independent review of radiology images, and any outside records:1} {Document your discussion with family members, caretakers, and with consultants:1} {Document social determinants of health affecting pt's care:1} {Document your decision making why or why not admission, treatments were needed:1} Final Clinical Impression(s) / ED Diagnoses Final diagnoses:  Sinus tachycardia    Rx / DC Orders ED Discharge Orders          Ordered    metoprolol tartrate (LOPRESSOR) 25 MG tablet  2 times daily        08/17/21 1750

## 2021-08-17 NOTE — ED Triage Notes (Addendum)
Patient reports that she has had tachycardia x 3 days. Today when the patient went to a renal doctor today she was instructed to come to the ED due to HR-140's. HR in triage 147. Patient also c/o hypotension, diaphoretic, and a headache.

## 2021-08-18 LAB — T4: T4, Total: 5.4 ug/dL (ref 4.5–12.0)

## 2021-08-23 ENCOUNTER — Encounter: Payer: Self-pay | Admitting: Cardiology

## 2021-08-23 ENCOUNTER — Ambulatory Visit: Payer: 59 | Admitting: Cardiology

## 2021-08-23 VITALS — BP 123/71 | HR 78 | Temp 98.0°F | Resp 17 | Ht 63.0 in | Wt 166.0 lb

## 2021-08-23 DIAGNOSIS — R6889 Other general symptoms and signs: Secondary | ICD-10-CM

## 2021-08-23 DIAGNOSIS — Z794 Long term (current) use of insulin: Secondary | ICD-10-CM

## 2021-08-23 DIAGNOSIS — R000 Tachycardia, unspecified: Secondary | ICD-10-CM

## 2021-08-23 DIAGNOSIS — I10 Essential (primary) hypertension: Secondary | ICD-10-CM

## 2021-08-23 MED ORDER — METOPROLOL TARTRATE 25 MG PO TABS: 12.5000 mg | ORAL_TABLET | Freq: Two times a day (BID) | ORAL | 2 refills | Status: DC

## 2021-08-23 NOTE — Telephone Encounter (Signed)
Patient scheduled for OV 

## 2021-08-23 NOTE — Progress Notes (Signed)
Primary Physician/Referring:  Irena Reichmann, DO  Patient ID: Kayla Berger, female    DOB: 09/20/1960, 61 y.o.   MRN: [id number]  Chief Complaint  Patient presents with   Tachycardia   New Patient (Initial Visit)   HPI:    Kayla Berger  is a 61 y.o. Patient with hypertension, hyperlipidemia, diabetes mellitus, seen in the emergency room on 08/17/2021 when she presented with palpitations.  She was discharged from the hospital recently after she was admitted with acute kidney injury and stayed in the hospital for 6 days starting 08/02/2021 and discharged on 08/16/2021 but presented to emergency room on 08/17/2021.  Acute renal failure was precipitated by dehydration and hypovolemic shock after she started with Ozempic.  She had lost about 25 pounds in weight but has started to gain weight again.  States that due to elevated blood sugar she had to increase her insulin again.  She has noticed marked decrease in exercise tolerance since all these episodes started.  Denies chest pain or dyspnea or palpitations.  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   Migraines    occ   PCOS (polycystic ovarian syndrome) 1986   PONV (postoperative nausea and vomiting)    Renal disorder    Shoulder pain    Thyroid disease    Trochanteric bursitis of left hip    Vaginal cysts    4 have been removed   Past Surgical History:  Procedure Laterality Date   APPENDECTOMY  1967   CARDIAC CATHETERIZATION Bilateral    cataracts   CHOLECYSTECTOMY N/A 02/16/2021   Procedure: LAPAROSCOPIC CHOLECYSTECTOMY;  Surgeon: [name], MD;  Location: [facility] OR;  Service: General;  Laterality: N/A;   KYPHOPLASTY N/A  10/31/2016   Procedure: KYPHOPLASTY LUMBAR ONE, LUMBAR TWO AND  LUMBAR THREE;  Surgeon: Venita Lick, MD;  Location: [facility] OR;  Service: Orthopedics;  Laterality: N/A;   nsvd  2001   normal vaginal del   sonobysterogram, laporoscopy  1997   fertility test   Family History  Problem Relation Age of Onset   Hypertension Mother    Diabetes Mother    Stroke Mother 73       first one, (pt had 3 total)   Hypertension Father    Cancer Father    Hypertension Sister    Heart disease Sister    Hypertension Maternal Grandmother    Heart attack Maternal Grandfather    Diabetes Maternal Grandfather    Diabetes Paternal Grandfather     Social History   Tobacco Use   Smoking status: Never   Smokeless tobacco: Never  Substance Use Topics   Alcohol use: No   Marital Status: Divorced  ROS  Review of Systems  Constitutional: Positive for malaise/fatigue and weight gain.  Cardiovascular:  Negative for chest pain, dyspnea on exertion and leg swelling.   Objective  Blood pressure 123/71, pulse 78, temperature 98 F (36.7 C), resp. rate 17, height 5\' 3"  (1.6 m), weight 166 lb (75.3 kg), last menstrual period 10/06/2012, SpO2 100 %. Body mass index is 29.41 kg/m.     08/23/2021  1:15 PM 08/17/2021    5:45 PM 08/17/2021    5:30 PM  Vitals with BMI  Height 5\' 3"     Weight 166 lbs    BMI 29.41    Systolic 123 115  Diastolic 71 86 76  Pulse 78 118 115    Physical Exam Neck:     Vascular: No JVD.  Cardiovascular:     Rate and Rhythm: Normal rate and regular rhythm.     Pulses: Intact distal pulses.     Heart sounds: Normal heart sounds. No murmur heard.    No gallop.  Pulmonary:     Effort: Pulmonary effort is normal.     Breath sounds: Normal breath sounds.  Abdominal:     General: Bowel sounds are normal.     Palpations: Abdomen is soft.  Musculoskeletal:     Right lower leg: No edema.     Left lower leg: No edema.     Medications and allergies   Allergies  Allergen  Reactions   Hydrocodone Itching, Swelling and Other (See Comments)    Numbness of the face/hands and tingling also     Medication list after today's encounter   Current Outpatient Medications:    Alpha Lipoic Acid 200 MG CAPS, Take 200 mg by mouth at bedtime., Disp: , Rfl:    Ascorbic Acid (VITAMIN C) 1000 MG tablet, Take 1,000 mg by mouth daily., Disp: , Rfl:    aspirin 81 MG chewable tablet, Chew 81 mg by mouth in the morning., Disp: , Rfl:    atorvastatin (LIPITOR) 10 MG tablet, Take 10 mg by mouth at bedtime., Disp: , Rfl:    Biotin 431 MCG TABS, Take 10,000 mcg by mouth every evening., Disp: , Rfl:    cetirizine (ZYRTEC) 10 MG tablet, Take 10 mg by mouth in the morning., Disp: , Rfl:    Cholecalciferol (VITAMIN D3) 50 MCG (2000 UT) TABS, Take 2,000 Units by mouth 2 (two) times daily., Disp: , Rfl:    CRANBERRY PO, Take 15,000 mg by mouth in the morning., Disp: , Rfl:    DULoxetine (CYMBALTA) 60 MG capsule, Take 60 mg by mouth 2 (two) times daily., Disp: , Rfl:    EXCEDRIN MIGRAINE 250-250-65 MG tablet, Take 1 tablet by mouth every 6 (six) hours as needed for headache or migraine., Disp: , Rfl:    feeding supplement (ENSURE ENLIVE / ENSURE PLUS) LIQD, Take 237 mLs by mouth 2 (two) times daily between meals., Disp: 237 mL, Rfl: 12   gabapentin (NEURONTIN) 800 MG tablet, Take 400 mg by mouth at bedtime., Disp: , Rfl:    HUMALOG KWIKPEN 100 UNIT/ML KwikPen, Inject 4-12 Units into the skin 3 (three) times daily as needed (for a BGL greater than 100)., Disp: , Rfl:    insulin glargine, 2 Unit Dial, (TOUJEO MAX SOLOSTAR) 300 UNIT/ML Solostar Pen, Inject 10 Units into the skin at bedtime. (Patient taking differently: Inject 38 Units into the skin at bedtime.), Disp: , Rfl:    lisinopril-hydrochlorothiazide (ZESTORETIC) 10-12.5 MG tablet, Take 1 tablet by mouth daily., Disp: , Rfl:    Multiple Vitamins-Minerals (CENTRUM WOMEN) TABS, Take 1 tablet by mouth daily with breakfast., Disp: , Rfl:     ondansetron (ZOFRAN) 4 MG tablet, Take 1 tablet (4 mg total) by mouth every 6 (six) hours as needed for nausea or vomiting., Disp: 20 tablet, Rfl: 0   pantoprazole (PROTONIX) 40 MG tablet, Take 1 tablet (40 mg total) by mouth 2 (two) times  daily., Disp: 60 tablet, Rfl: 0   Teriparatide, Recombinant, 620 MCG/2.48ML SOPN, Inject 20 mcg into the skin at bedtime., Disp: , Rfl:    zinc gluconate 50 MG tablet, Take 50 mg by mouth in the morning., Disp: , Rfl:    metoprolol tartrate (LOPRESSOR) 25 MG tablet, Take 0.5 tablets (12.5 mg total) by mouth 2 (two) times daily., Disp: 60 tablet, Rfl: 2  Laboratory examination:   Lab Results  Component Value Date   NA 143 08/17/2021   K 4.1 08/17/2021   CO2 25 08/17/2021   GLUCOSE 151 (H) 08/17/2021   BUN 46 (H) 08/17/2021   CREATININE 1.46 (H) 08/17/2021   CALCIUM 10.1 08/17/2021   GFRNONAA 41 (L) 08/17/2021       Latest Ref Rng & Units 08/17/2021    2:01 PM 08/08/2021   12:50 AM 08/07/2021    8:25 AM  CMP  Glucose 70 - 99 mg/dL 330  076  226   BUN 6 - 20 mg/dL 46  27  28   Creatinine 0.44 - 1.00 mg/dL 3.33  5.45  6.25   Sodium 135 - 145 mmol/L 143  143  141   Potassium 3.5 - 5.1 mmol/L 4.1  3.6  3.6   Chloride 98 - 111 mmol/L 108  100  97   CO2 22 - 32 mmol/L 25  30  28    Calcium 8.9 - 10.3 mg/dL  8.7  8.8       Latest Ref Rng & Units 08/17/2021    2:01 PM 08/08/2021   12:50 AM 08/07/2021    8:25 AM  CBC  WBC 4.0 - 10.5 K/uL 11.3  7.5  6.8   Hemoglobin 12.0 - 15.0 g/dL 10/08/2021  9.2  93.7   Hematocrit 36.0 - 46.0 % 35.4  27.3  31.2   Platelets 150 - 400 K/uL 365  231  229     Lipid Panel No results for input(s): "CHOL", "TRIG", "LDLCALC", "VLDL", "HDL", "CHOLHDL", "LDLDIRECT" in the last 8760 hours.  HEMOGLOBIN A1C Lab Results  Component Value Date   HGBA1C 6.6 (H) 08/04/2021   MPG 142.72 08/04/2021   TSH Recent Labs    08/17/21 1500  TSH 3.256    Radiology:    Cardiac Studies:   NA  EKG:   EKG 08/23/2021: Normal  sinus rhythm at the rate of 78 bpm, poor R wave progression, probably normal variant.  No evidence of ischemia otherwise normal EKG.    EKG 08/17/2021: Sinus tachycardia at rate of 143 bpm.  Otherwise normal EKG.  Assessment     ICD-10-CM   1. Tachycardia  R00.0 EKG 12-Lead    metoprolol tartrate (LOPRESSOR) 25 MG tablet    2. Primary hypertension  I10 PCV ECHOCARDIOGRAM COMPLETE    PCV MYOCARDIAL PERFUSION WO LEXISCAN    metoprolol tartrate (LOPRESSOR) 25 MG tablet    3. Type 2 diabetes mellitus with diabetic nephropathy, with long-term current use of insulin (HCC)  E11.21 PCV ECHOCARDIOGRAM COMPLETE   Z79.4 PCV MYOCARDIAL PERFUSION WO LEXISCAN    4. Decreased exercise tolerance  R68.89 PCV ECHOCARDIOGRAM COMPLETE    PCV MYOCARDIAL PERFUSION WO LEXISCAN       Orders Placed This Encounter  Procedures   PCV MYOCARDIAL PERFUSION WO LEXISCAN    Standing Status:   Future    Standing Expiration Date:   10/24/2021   EKG 12-Lead   PCV ECHOCARDIOGRAM COMPLETE    Standing Status:   Future    Standing  Expiration Date:   08/24/2022    Meds ordered this encounter  Medications   metoprolol tartrate (LOPRESSOR) 25 MG tablet    Sig: Take 0.5 tablets (12.5 mg total) by mouth 2 (two) times daily.    Dispense:  60 tablet    Refill:  2    Medications Discontinued During This Encounter  Medication Reason   metoprolol tartrate (LOPRESSOR) 25 MG tablet Reorder     Recommendations:   Kayla Berger is a 61 y.o.  Patient with hypertension, hyperlipidemia, diabetes mellitus, seen in the emergency room on 08/17/2021 when she presented with palpitations.  She was discharged from the hospital recently after she was admitted with acute kidney injury and stayed in the hospital for 6 days starting 08/02/2021 and discharged on 08/16/2021 but presented to emergency room on 08/17/2021.  Acute renal failure was precipitated by dehydration and hypovolemic shock after she started with Ozempic.  She had lost  about 25 pounds in weight but has started to gain weight again.  States that due to elevated blood sugar she had to increase her insulin again.  She has noticed marked decrease in exercise tolerance since all these episodes started.  Denies chest pain or dyspnea or palpitations.  Heart rate is now improved, when she was seen in the emergency room and recommended that she start metoprolol 25 mg twice daily, she is not tachycardic today.  We will reduce the dose of metoprolol to 12.5 g p.o. twice daily in view of fatigue and dyspnea.  We will also set up for a nuclear stress test in view of high cardiovascular risk factors.  Echocardiogram to evaluate for wall motion abnormality.  I will see her back in 4 to 6 weeks for follow-up.  Physical examination is unremarkable.    Yates Decamp, MD, [facility] 08/23/2021, 2:10 PM Office: [phone]

## 2021-09-10 ENCOUNTER — Other Ambulatory Visit: Payer: 59

## 2021-09-12 ENCOUNTER — Ambulatory Visit: Payer: 59

## 2021-09-12 DIAGNOSIS — E1121 Type 2 diabetes mellitus with diabetic nephropathy: Secondary | ICD-10-CM

## 2021-09-12 DIAGNOSIS — R6889 Other general symptoms and signs: Secondary | ICD-10-CM

## 2021-09-12 DIAGNOSIS — I10 Essential (primary) hypertension: Secondary | ICD-10-CM

## 2021-09-20 ENCOUNTER — Other Ambulatory Visit: Payer: 59

## 2021-10-03 NOTE — Progress Notes (Signed)
Primary Physician/Referring:  Janie Morning, DO  Patient ID: Kayla Berger, female    DOB: September 06, 1960, 61 y.o.   MRN: [id number]  Chief Complaint  Patient presents with   Tachycardia   Follow-up    6 week   HPI:    Kayla Berger  is a 61 y.o. Patient with hypertension, hyperlipidemia, diabetes mellitus, seen in the emergency room on 08/17/2021 when she presented with palpitations.  She was discharged from the hospital recently after she was admitted with acute kidney injury and stayed in the hospital for 6 days starting 08/02/2021 and discharged on 08/16/2021 but presented to emergency room on 08/17/2021.  Acute renal failure was precipitated by dehydration and hypovolemic shock after she started with Ozempic.  Chronic renal insufficiency has remained stable.  She now presents for a 61-month follow-up.  She has not had any chest pain, no palpitations, remains asymptomatic.  Due to back pain, she has been referred for physical therapy which she will be starting soon. 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   Migraines    occ   PCOS (polycystic ovarian syndrome) 1986   PONV (postoperative nausea and vomiting)    Renal disorder    Shoulder pain    Thyroid disease    Trochanteric bursitis of left hip    Vaginal cysts    4 have been removed   Past Surgical History:  Procedure Laterality Date   APPENDECTOMY  1967   CARDIAC CATHETERIZATION Bilateral    cataracts   CHOLECYSTECTOMY N/A 02/16/2021   Procedure: LAPAROSCOPIC CHOLECYSTECTOMY;  Surgeon: Dwan Bolt, MD;  Location: [facility];  Service: General;  Laterality: N/A;   KYPHOPLASTY N/A 10/31/2016   Procedure: KYPHOPLASTY LUMBAR ONE,  LUMBAR TWO AND  LUMBAR THREE;  Surgeon: Melina Schools, MD;  Location: [facility];  Service: Orthopedics;  Laterality: N/A;   nsvd  2001   normal vaginal del   sonobysterogram, laporoscopy  1997   fertility test   Family History  Problem Relation Age of Onset   Hypertension Mother    Diabetes Mother    Stroke Mother 35       first one, (pt had 3 total)   Hypertension Father    Cancer Father    Hypertension Sister    Heart disease Sister    Hypertension Maternal Grandmother    Heart attack Maternal Grandfather    Diabetes Maternal Grandfather    Diabetes Paternal Grandfather     Social History   Tobacco Use   Smoking status: Never   Smokeless tobacco: Never  Substance Use Topics   Alcohol use: No   Marital Status: Divorced  ROS  Review of Systems  Constitutional: Positive for malaise/fatigue and weight gain.  Cardiovascular:  Negative for chest pain, dyspnea on exertion and leg swelling.   Objective  Blood pressure 138/82, pulse 78, temperature 98 F (36.7 C), resp. rate 16, height $RemoveBe'5\' 3"'jRFDkJVKW$  (1.6 m), weight 184 lb (83.5 kg), last menstrual period 10/06/2012, SpO2 100 %. Body mass index is 32.59 kg/m.     10/04/2021    8:26 AM 08/23/2021  1:15 PM 08/17/2021    5:45 PM  Vitals with BMI  Height $Remov'5\' 3"'yiCmKF$  $Remove'5\' 3"'QasUJeg$    Weight 184 lbs 166 lbs   BMI 10.9 32.35   Systolic 573 220 254  Diastolic 82 71 86  Pulse 78 78 118    Physical Exam Neck:     Vascular: No JVD.  Cardiovascular:     Rate and Rhythm: Normal rate and regular rhythm.     Pulses: Intact distal pulses.     Heart sounds: Normal heart sounds. No murmur heard.    No gallop.  Pulmonary:     Effort: Pulmonary effort is normal.     Breath sounds: Normal breath sounds.  Abdominal:     General: Bowel sounds are normal.     Palpations: Abdomen is soft.  Musculoskeletal:     Right lower leg: No edema.     Left lower leg: No edema.     Medications and allergies   Allergies  Allergen Reactions   Hydrocodone Itching,  Swelling and Other (See Comments)    Numbness of the face/hands and tingling also     Medication list after today's encounter   Current Outpatient Medications:    Alpha Lipoic Acid 200 MG CAPS, Take 200 mg by mouth at bedtime., Disp: , Rfl:    Ascorbic Acid (VITAMIN C) 1000 MG tablet, Take 1,000 mg by mouth daily., Disp: , Rfl:    aspirin 81 MG chewable tablet, Chew 81 mg by mouth in the morning., Disp: , Rfl:    atorvastatin (LIPITOR) 10 MG tablet, Take 10 mg by mouth at bedtime., Disp: , Rfl:    Biotin 10000 MCG TABS, Take 10,000 mcg by mouth every evening., Disp: , Rfl:    cetirizine (ZYRTEC) 10 MG tablet, Take 10 mg by mouth in the morning., Disp: , Rfl:    Cholecalciferol (VITAMIN D3) 50 MCG (2000 UT) TABS, Take 2,000 Units by mouth 2 (two) times daily., Disp: , Rfl:    CRANBERRY PO, Take 15,000 mg by mouth in the morning., Disp: , Rfl:    DULoxetine (CYMBALTA) 60 MG capsule, Take 60 mg by mouth 2 (two) times daily., Disp: , Rfl:    EXCEDRIN MIGRAINE 250-250-65 MG tablet, Take 1 tablet by mouth every 6 (six) hours as needed for headache or migraine., Disp: , Rfl:    ferrous sulfate 325 (65 FE) MG EC tablet, 1 tablet, Disp: , Rfl:    gabapentin (NEURONTIN) 800 MG tablet, Take 400 mg by mouth at bedtime., Disp: , Rfl:    HUMALOG KWIKPEN 100 UNIT/ML KwikPen, Inject 4-12 Units into the skin 3 (three) times daily as needed (for a BGL greater than 100)., Disp: , Rfl:    insulin glargine, 2 Unit Dial, (TOUJEO MAX SOLOSTAR) 300 UNIT/ML Solostar Pen, Inject 10 Units into the skin at bedtime. (Patient taking differently: Inject 38 Units into the skin at bedtime.), Disp: , Rfl:    JARDIANCE 10 MG TABS tablet, Take 10 mg by mouth daily., Disp: , Rfl:    metoprolol tartrate (LOPRESSOR) 25 MG tablet, Take 0.5 tablets (12.5 mg total) by mouth 2 (two) times daily., Disp: 60 tablet, Rfl: 2   Multiple Vitamins-Minerals (CENTRUM WOMEN) TABS, Take 1 tablet by mouth daily with breakfast., Disp: , Rfl:     ondansetron (ZOFRAN) 4 MG tablet, Take 1 tablet (4 mg total) by mouth every 6 (six) hours as needed for nausea or vomiting., Disp: 20 tablet, Rfl: 0   pantoprazole (PROTONIX) 40 MG tablet,  Take 1 tablet (40 mg total) by mouth 2 (two) times daily. (Patient taking differently: Take 40 mg by mouth daily.), Disp: 60 tablet, Rfl: 0   Teriparatide, Recombinant, 620 MCG/2.48ML SOPN, Inject 20 mcg into the skin at bedtime., Disp: , Rfl:    zinc gluconate 50 MG tablet, Take 50 mg by mouth in the morning., Disp: , Rfl:    lisinopril-hydrochlorothiazide (ZESTORETIC) 10-12.5 MG tablet, Take 1 tablet by mouth daily. (Patient not taking: Reported on 10/04/2021), Disp: , Rfl:   Laboratory examination:   Lab Results  Component Value Date   NA 143 08/17/2021   K 4.1 08/17/2021   CO2 25 08/17/2021   GLUCOSE 151 (H) 08/17/2021   BUN 46 (H) 08/17/2021   CREATININE 1.46 (H) 08/17/2021   CALCIUM 10.1 08/17/2021   GFRNONAA 41 (L) 08/17/2021       Latest Ref Rng & Units 08/17/2021    2:01 PM 08/08/2021   12:50 AM 08/07/2021    8:25 AM  CMP  Glucose 70 - 99 mg/dL 151  164  165   BUN 6 - 20 mg/dL 46  27  28   Creatinine 0.44 - 1.00 mg/dL 1.46  4.13  4.69   Sodium 135 - 145 mmol/L 143  143  141   Potassium 3.5 - 5.1 mmol/L 4.1  3.6  3.6   Chloride 98 - 111 mmol/L 108  100  97   CO2 22 - 32 mmol/L $RemoveB'25  30  28   'KdfVBENB$ Calcium 8.9 - 10.3 mg/dL 10.1  8.7  8.8       Latest Ref Rng & Units 08/17/2021    2:01 PM 08/08/2021   12:50 AM 08/07/2021    8:25 AM  CBC  WBC 4.0 - 10.5 K/uL 11.3  7.5  6.8   Hemoglobin 12.0 - 15.0 g/dL 11.6  9.2  10.7   Hematocrit 36.0 - 46.0 % 35.4  27.3  31.2   Platelets 150 - 400 K/uL 365  231  229     Lipid Panel No results for input(s): "CHOL", "TRIG", "[place]", "VLDL", "HDL", "CHOLHDL", "LDLDIRECT" in the last 8760 hours.  HEMOGLOBIN A1C Lab Results  Component Value Date   HGBA1C 6.6 (H) 08/04/2021   MPG 142.72 08/04/2021   TSH Recent Labs    08/17/21 1500  TSH 3.256    External labs:  Labs 08/23/2021:  Hb 9.3/HCT 28.7, platelets 307.  Normal indicis.  BUN 45, creatinine 1.51, EGFR 37 mL, potassium 4.7, LFTs normal.  A1c 7.6%.  TSH normal at 3.97.  Vitamin D 55.7.  Labs 04/12/2021:  Total cholesterol 121, triglycerides 145, HDL 40, LDL 52.  Radiology:    Cardiac Studies:   Echo pending    PCV MYOCARDIAL PERFUSION WO LEXISCAN 09/12/2021  Narrative Exercise nuclear stress test 09/12/21 Myocardial perfusion is abnormal. Overall LV systolic function is normal without regional wall motion abnormalities. Intermediate risk study. There is a reversible mild defect in the inferior and apical regions. Stress LV EF: 51%. Abnormal ECG stress. The patient exercised for 6 minutes and 41 seconds of a Modified Bruce protocol, achieving approximately 8.09 METs and 89% MPHR. The heart rate response was normal. The blood pressure response was normal. No previous exam available for comparison.   EKG:   EKG 08/23/2021: Normal sinus rhythm at the rate of 78 bpm, poor R wave progression, probably normal variant.  No evidence of ischemia otherwise normal EKG.    EKG 08/17/2021: Sinus tachycardia at rate of 143 bpm.  Otherwise normal EKG.  Assessment     ICD-10-CM   1. Tachycardia  R00.0     2. Decreased exercise tolerance  R68.89     3. Abnormal nuclear stress test  R94.39     4. Type 2 diabetes mellitus with stage 3b chronic kidney disease, with long-term current use of insulin (HCC)  E11.22    N18.32    Z79.4        No orders of the defined types were placed in this encounter.   No orders of the defined types were placed in this encounter.   Medications Discontinued During This Encounter  Medication Reason   feeding supplement (ENSURE ENLIVE / ENSURE PLUS) LIQD       Recommendations:   Kayla Berger is a 61 y.o.  Patient with hypertension, hyperlipidemia, diabetes mellitus, seen in the emergency room on 08/17/2021 when she presented with  palpitations.  She was discharged from the hospital recently after she was admitted with acute kidney injury and stayed in the hospital for 6 days starting 08/02/2021 and discharged on 08/16/2021 but presented to emergency room on 08/17/2021.  Acute renal failure was precipitated by dehydration and hypovolemic shock after she started with Ozempic.  She presents here for a 36-month follow-up, underwent nuclear stress testing, echocardiogram is still pending.  Nuclear stress test reveals clear-cut inferior ischemia.  She remains asymptomatic without chest pain or dyspnea.  Hence at this point.  Intermediate restudy, I would like to continue medical therapy only, she is on ACE inhibitor's and a beta-blocker, heart rate is well controlled, blood pressure is also well controlled.  Continue aspirin 81 mg daily.  Hence continue the same and I would like to see him back in 3 months for follow-up.  Reviewed external labs, renal function has remained stable.  Her lipids are also well controlled.  With regard to diabetes mellitus, with Ozempic.  She had lost 25 pounds in weight, she has pretty much essentially gained all the weight back.  She would like to try Ozempic again and she is aware of dehydration that can come with the use of the Ozempic and she is willing to keep herself well-hydrated.  Advised her to discuss with her endocrinologist regarding this.    Adrian Prows, MD, [facility] 10/04/2021, 9:21 AM Office: [phone]

## 2021-10-04 ENCOUNTER — Encounter: Payer: Self-pay | Admitting: Cardiology

## 2021-10-04 ENCOUNTER — Ambulatory Visit: Payer: 59 | Admitting: Cardiology

## 2021-10-04 VITALS — BP 128/78 | HR 78 | Temp 98.0°F | Resp 16 | Ht 63.0 in | Wt 184.0 lb

## 2021-10-04 DIAGNOSIS — Z794 Long term (current) use of insulin: Secondary | ICD-10-CM

## 2021-10-04 DIAGNOSIS — R000 Tachycardia, unspecified: Secondary | ICD-10-CM

## 2021-10-04 DIAGNOSIS — R9439 Abnormal result of other cardiovascular function study: Secondary | ICD-10-CM

## 2021-10-04 DIAGNOSIS — E1122 Type 2 diabetes mellitus with diabetic chronic kidney disease: Secondary | ICD-10-CM

## 2021-10-04 DIAGNOSIS — R6889 Other general symptoms and signs: Secondary | ICD-10-CM

## 2021-10-05 ENCOUNTER — Ambulatory Visit: Payer: 59

## 2021-10-05 DIAGNOSIS — R6889 Other general symptoms and signs: Secondary | ICD-10-CM

## 2021-10-05 DIAGNOSIS — E1121 Type 2 diabetes mellitus with diabetic nephropathy: Secondary | ICD-10-CM

## 2021-10-05 DIAGNOSIS — I10 Essential (primary) hypertension: Secondary | ICD-10-CM

## 2021-11-07 ENCOUNTER — Other Ambulatory Visit: Payer: Self-pay

## 2021-11-07 ENCOUNTER — Ambulatory Visit: Payer: 59 | Attending: Neurosurgery

## 2021-11-07 DIAGNOSIS — M5459 Other low back pain: Secondary | ICD-10-CM | POA: Diagnosis not present

## 2021-11-07 DIAGNOSIS — M6281 Muscle weakness (generalized): Secondary | ICD-10-CM | POA: Diagnosis present

## 2021-11-07 DIAGNOSIS — M5416 Radiculopathy, lumbar region: Secondary | ICD-10-CM | POA: Insufficient documentation

## 2021-11-07 DIAGNOSIS — R2681 Unsteadiness on feet: Secondary | ICD-10-CM | POA: Diagnosis present

## 2021-11-07 NOTE — Therapy (Signed)
OUTPATIENT PHYSICAL THERAPY THORACOLUMBAR EVALUATION   Patient Name: Kayla Berger MRN: [id number] DOB:May 29, 1960, 61 y.o., female Today's Date: 11/07/2021   PT End of Session - 11/07/21 0824     Visit Number 1    Number of Visits 8    Date for PT Re-Evaluation 12/21/21    PT Start Time 0826    PT Stop Time 0915    PT Time Calculation (min) 49 min    Activity Tolerance Patient tolerated treatment well    Behavior During Therapy WFL for tasks assessed/performed             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   Migraines    occ   PCOS (polycystic ovarian syndrome) 1986   PONV (postoperative nausea and vomiting)    Renal disorder    Shoulder pain    Thyroid disease    Trochanteric bursitis of left hip    Vaginal cysts    4 have been removed   Past Surgical History:  Procedure Laterality Date   APPENDECTOMY  1967   CARDIAC CATHETERIZATION Bilateral    cataracts   CHOLECYSTECTOMY N/A 02/16/2021   Procedure: LAPAROSCOPIC CHOLECYSTECTOMY;  Surgeon: [name], MD;  Location: [facility] OR;  Service: General;  Laterality: N/A;   KYPHOPLASTY N/A 10/31/2016   Procedure: KYPHOPLASTY LUMBAR ONE, LUMBAR TWO AND  LUMBAR THREE;  Surgeon: Venita Lick, MD;  Location: [facility] OR;  Service: Orthopedics;  Laterality: N/A;   nsvd  2001   normal vaginal del   sonobysterogram, laporoscopy  1997   fertility test   Patient Active Problem List   Diagnosis Date Noted   Malnutrition of moderate degree 08/07/2021   Acute kidney failure (HCC) 08/02/2021   Hyperkalemia, diminished renal excretion 08/02/2021   Lactic acidosis 08/02/2021   Closed compression fracture of L2 lumbar vertebra  10/31/2016   Hyperlipidemia 09/23/2012   Hypothyroid 09/23/2012   Allergic rhinitis 09/23/2012   History of migraine headaches 09/23/2012   DM2 (diabetes mellitus, type 2) (HCC) 06/26/2012   HTN (hypertension) 06/26/2012    PCP: Irena Reichmann, DO  REFERRING PROVIDER: Donalee Citrin, MD  REFERRING DIAG: Spondylosis without myelopathy or radiculopathy, lumbar region  Rationale for Evaluation and Treatment Rehabilitation  THERAPY DIAG:  Other low back pain  ONSET DATE: chronic  SUBJECTIVE:  SUBJECTIVE STATEMENT: Patient reports that she has had chronic back pain for years. She had physical therapy last year which seemed to help get her stronger. However, she notes that it began getting worse around spring 2023 as she could not walk as much. She then was admitted into the hospital in July with acute kidney failure which caused her to get really weak. She also spends most of her day sitting. She notes that she can have trouble standing doing activities due to diabetic neuropathy in both feet.  PERTINENT HISTORY:  Neuropathy in both feet, bilateral shoulder pain, osteoporosis, HTN, DM, OA  PAIN:  Are you having pain? Yes: NPRS scale: 8/10 Pain location: low back radiating down the back of her leg, but never goes past the knee (R>L)  Pain description: ache, sore, sharp, and constant  Aggravating factors: walking up and down hills, sitting for prolonged periods (about 2-3 hours), bending over, lifting, cleaning, sweeping, and mopping Relieving factors: getting up and walking for short periods   PRECAUTIONS: Fall  WEIGHT BEARING RESTRICTIONS No  FALLS:  Has patient fallen in last 6 months? Yes. 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)   PLOF: Independent  PATIENT GOALS walk longer (she is only able to walk 5-10 minutes currently), lifting her pots and pans from a low shelf, unable to clean   OBJECTIVE:   SCREENING FOR RED FLAGS: Bowel or bladder incontinence: No Spinal tumors: No Cauda equina syndrome: No Compression fracture: No Abdominal aneurysm: No  COGNITION:  Overall cognitive status: Within functional limits for tasks assessed     SENSATION: Patient reports numbness in both feet due to diabetic neuropathy  POSTURE: rounded shoulders, forward head, and decreased lumbar lordosis  PALPATION: TTP: bilateral lumbar paraspinals, QL, gluteals, piriformis, TFL, and IT band  LUMBAR ROM:   Active  A/PROM  eval  Flexion 18; familiar low back pain   Extension 12; familiar low back pain   Right lateral flexion 75% limited; limited by familiar low back pain  Left lateral flexion 75% limited; limited by familiar low back pain  Right rotation 50% limited; limited by familiar low back pain   Left rotation 50% limited; limited by familiar low back pain   (Blank rows = not tested)  LOWER EXTREMITY ROM: WFL for activities assessed    LOWER EXTREMITY MMT:  increased her familiar pain from 5-6/10 to 7/10  MMT Right eval Left eval  Hip flexion 4-/5 4/5  Hip extension    Hip abduction    Hip adduction    Hip internal rotation    Hip external rotation    Knee flexion 4+/5 reproduced low back pain into right hip 4+/5  Knee extension 4+/5 5/5  Ankle dorsiflexion 4/5 4/5  Ankle plantarflexion    Ankle inversion    Ankle eversion     (Blank rows = not tested)  FUNCTIONAL TESTS:  5 times sit to stand: 27.92 seconds w/o UE  support  GAIT: Assistive device utilized: None Level of assistance: Complete Independence Comments: decreased gait speed and stride length  TODAY'S TREATMENT     PATIENT EDUCATION:  Education details: POC,  healing, prognosis, dry needling Person educated: Patient Education method: Explanation Education comprehension: verbalized understanding   HOME EXERCISE PROGRAM:   ASSESSMENT:  CLINICAL IMPRESSION: Patient is a 61 y.o. female who was seen today for physical therapy evaluation and treatment for chronic low back pain. She presented with moderate to high pain severity and irritability with lumbar AROM and palpation to her lumbar musculature being the most aggravating to her familiar symptoms. Recommend that she continue with skilled physical therapy to address her remaining impairments to maximize her functional mobility and safety.   OBJECTIVE IMPAIRMENTS Abnormal gait, decreased activity tolerance, decreased balance, decreased mobility, difficulty walking, decreased ROM, decreased strength, hypomobility, impaired flexibility, impaired sensation, impaired tone, postural dysfunction, and pain.   ACTIVITY LIMITATIONS carrying, lifting, bending, sitting, standing, squatting, sleeping, stairs, transfers, and locomotion level  PARTICIPATION LIMITATIONS: meal prep, cleaning, laundry, shopping, community activity, occupation, and yard work  PERSONAL FACTORS Past/current experiences, Profession, Time since onset of injury/illness/exacerbation, and 3+ comorbidities: HTN, DM, OA, bilateral shoulder pain, osteoporosis, and diabetic neuropathy  are also affecting patient's functional outcome.   REHAB POTENTIAL: Fair    CLINICAL DECISION MAKING: Evolving/moderate complexity  EVALUATION COMPLEXITY: Moderate   GOALS: Goals reviewed with patient? No  LONG TERM GOALS: Target date: 12/05/2021  Patient will be independent with her HEP.  Baseline:  Goal status: INITIAL  2.  Patient will  be able to complete her daily activities without her familiar pain exceeding 6/10.  Baseline:  Goal status: INITIAL  3.  Patient will be able to demonstrate at least 25 degrees of lumbar flexion for improved function picking items up from a low shelf.  Baseline:  Goal status: INITIAL  4.  Patient will be able to stand and walk at least 20 minutes without being limited by her familiar low back pain.  Baseline:  Goal status: INITIAL  5.  Patient will be able to lift at least 5 pounds from a low stool to simulate lifting her pots from a low shelf without being limited by her low back pain.  Baseline:  Goal status: INITIAL  PLAN: PT FREQUENCY: 2x/week  PT DURATION: 4 weeks  PLANNED INTERVENTIONS: Therapeutic exercises, Therapeutic activity, Neuromuscular re-education, Balance training, Gait training, Patient/Family education, Self Care, Joint mobilization, Stair training, Dry Needling, Electrical stimulation, Spinal mobilization, Cryotherapy, Moist heat, Traction, Manual therapy, and Re-evaluation.  PLAN FOR NEXT SESSION: nustep, lower extremity strengthening, postural reeducation, lumbar stabilization, and modalities as needed   Darlin Coco, PT 11/07/2021, 3:34 PM

## 2021-11-13 ENCOUNTER — Ambulatory Visit: Payer: 59

## 2021-11-13 DIAGNOSIS — M5459 Other low back pain: Secondary | ICD-10-CM

## 2021-11-13 NOTE — Therapy (Signed)
OUTPATIENT PHYSICAL THERAPY THORACOLUMBAR TREATMENT   Patient Name: Kayla Berger MRN: [id number] DOB:[birth year], 61 y.o., female Today's Date: 11/13/2021   PT End of Session - 11/13/21 0732     Visit Number 2    Number of Visits 8    Date for PT Re-Evaluation 12/21/21    PT Start Time 0729    PT Stop Time 0824    PT Time Calculation (min) 55 min    Activity Tolerance Patient tolerated treatment well    Behavior During Therapy WFL for tasks assessed/performed              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   Migraines    occ   PCOS (polycystic ovarian syndrome) 1986   PONV (postoperative nausea and vomiting)    Renal disorder    Shoulder pain    Thyroid disease    Trochanteric bursitis of left hip    Vaginal cysts    4 have been removed   Past Surgical History:  Procedure Laterality Date   APPENDECTOMY  1967   CARDIAC CATHETERIZATION Bilateral    cataracts   CHOLECYSTECTOMY N/A 02/16/2021   Procedure: LAPAROSCOPIC CHOLECYSTECTOMY;  Surgeon: [name], MD;  Location: [facility] OR;  Service: General;  Laterality: N/A;   KYPHOPLASTY N/A 10/31/2016   Procedure: KYPHOPLASTY LUMBAR ONE, LUMBAR TWO AND  LUMBAR THREE;  Surgeon: Venita Lick, MD;  Location: [facility] OR;  Service: Orthopedics;  Laterality: N/A;   nsvd  2001   normal vaginal del   sonobysterogram, laporoscopy  1997   fertility test   Patient Active Problem List   Diagnosis Date Noted   Malnutrition of moderate degree 08/07/2021   Acute kidney failure (HCC) 08/02/2021   Hyperkalemia, diminished renal excretion 08/02/2021   Lactic acidosis 08/02/2021   Closed compression fracture of L2 lumbar vertebra  10/31/2016   Hyperlipidemia 09/23/2012   Hypothyroid 09/23/2012   Allergic rhinitis 09/23/2012   History of migraine headaches 09/23/2012   DM2 (diabetes mellitus, type 2) (HCC) 06/26/2012   HTN (hypertension) 06/26/2012    PCP: Irena Reichmann, DO  REFERRING PROVIDER: Donalee Citrin, MD  REFERRING DIAG: Spondylosis without myelopathy or radiculopathy, lumbar region  Rationale for Evaluation and Treatment Rehabilitation  THERAPY DIAG:  Other low back pain  ONSET DATE: chronic  SUBJECTIVE:  SUBJECTIVE STATEMENT: Patient reports that her back is hurting quite a bit this morning. She notes that it has been like this since she woke up.  PERTINENT HISTORY:  Neuropathy in both feet, bilateral shoulder pain, osteoporosis, HTN, DM, OA  PAIN:  Are you having pain? Yes: NPRS scale: 8/10 Pain location: low back radiating down the back of her leg, but never goes past the knee (R>L)  Pain description: ache, sore, sharp, and constant  Aggravating factors: walking up and down hills, sitting for prolonged periods (about 2-3 hours), bending over, lifting, cleaning, sweeping, and mopping Relieving factors: getting up and walking for short periods   PRECAUTIONS: Fall  WEIGHT BEARING RESTRICTIONS No  FALLS:  Has patient fallen in last 6 months? Yes. 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)   PLOF: Independent  PATIENT GOALS walk longer (she is only able to  walk 5-10 minutes currently), lifting her pots and pans from a low shelf, unable to clean   OBJECTIVE: all objective measures were assess at her initial evaluation on 11/07/21 unless otherwise noted  SCREENING FOR RED FLAGS: Bowel or bladder incontinence: No Spinal tumors: No Cauda equina syndrome: No Compression fracture: No Abdominal aneurysm: No  COGNITION:  Overall cognitive status: Within functional limits for tasks assessed     SENSATION: Patient reports numbness in both feet due to diabetic neuropathy  POSTURE: rounded shoulders, forward head, and decreased lumbar lordosis  PALPATION: TTP: bilateral lumbar paraspinals, QL, gluteals, piriformis, TFL, and IT band  LUMBAR ROM:   Active  A/PROM  eval  Flexion 18; familiar low back pain   Extension 12; familiar low back pain   Right lateral flexion 75% limited; limited by familiar low back pain  Left lateral flexion 75% limited; limited by familiar low back pain  Right rotation 50% limited; limited by familiar low back pain   Left rotation 50% limited; limited by familiar low back pain   (Blank rows = not tested)  LOWER EXTREMITY ROM: WFL for activities assessed    LOWER EXTREMITY MMT:  increased her familiar pain from 5-6/10 to 7/10  MMT Right eval Left eval  Hip flexion 4-/5 4/5  Hip extension    Hip abduction    Hip adduction    Hip internal rotation    Hip external rotation    Knee flexion 4+/5 reproduced low back pain into right hip 4+/5  Knee extension 4+/5 5/5  Ankle dorsiflexion 4/5 4/5  Ankle plantarflexion    Ankle inversion    Ankle eversion     (Blank rows = not tested)  FUNCTIONAL TESTS:  5 times sit to stand: 27.92 seconds w/o UE support  GAIT: Assistive device utilized: None Level of assistance: Complete Independence Comments: decreased gait speed and stride length  TODAY'S TREATMENT                                    10/17 EXERCISE LOG  Exercise Repetitions and Resistance Comments   Nustep L3 x 8 minutes Limited by fatigue  Isometric ball press 20 reps w/  5 second hold   Ball roll out  2 minutes   Rocker board 3 minutes   Slouch overcorrect 3 minutes "Feels good"   Seated HS stretch 3 x 30 seconds  RLE: nonpainful LLE attempted, but painful  Standing knee flexion 2 minutes  Alternating LE    Blank cell = exercise not performed today  Modalities  Date:  Unattended Estim: Lumbar, IFC @ 80-150 Hz w/ 40% scan, 15 mins, Pain  PATIENT EDUCATION:  Education details: POC, healing, prognosis, dry needling Person educated: Patient Education method: Explanation Education comprehension: verbalized understanding   HOME EXERCISE PROGRAM:   ASSESSMENT:  CLINICAL IMPRESSION: Patient was introduced to multiple new interventions for reduced pain and improved mobility. She required minimal cueing with today's interventions for proper biomechanics and pacing to avoid aggravating her familiar symptoms. She presented with high pain severity and irritability which limited her ability to be progressed with additional interventions. She reported feeling better upon the conclusion of treatment. She continues to require skilled physical therapy to address her remaining impairments to maximize her functional mobility.   OBJECTIVE IMPAIRMENTS Abnormal gait, decreased activity tolerance, decreased balance, decreased mobility, difficulty walking, decreased ROM, decreased strength, hypomobility, impaired flexibility, impaired sensation, impaired tone, postural dysfunction, and pain.   ACTIVITY LIMITATIONS carrying, lifting, bending, sitting, standing, squatting, sleeping, stairs, transfers, and locomotion level  PARTICIPATION LIMITATIONS: meal prep, cleaning, laundry, shopping, community activity, occupation, and yard work  PERSONAL FACTORS Past/current experiences, Profession, Time since onset of injury/illness/exacerbation, and 3+ comorbidities: HTN, DM, OA, bilateral shoulder pain,  osteoporosis, and diabetic neuropathy  are also affecting patient's functional outcome.   REHAB POTENTIAL: Fair    CLINICAL DECISION MAKING: Evolving/moderate complexity  EVALUATION COMPLEXITY: Moderate   GOALS: Goals reviewed with patient? No  LONG TERM GOALS: Target date: 12/05/2021  Patient will be independent with her HEP.  Baseline:  Goal status: INITIAL  2.  Patient will be able to complete her daily activities without her familiar pain exceeding 6/10.  Baseline:  Goal status: INITIAL  3.  Patient will be able to demonstrate at least 25 degrees of lumbar flexion for improved function picking items up from a low shelf.  Baseline:  Goal status: INITIAL  4.  Patient will be able to stand and walk at least 20 minutes without being limited by her familiar low back pain.  Baseline:  Goal status: INITIAL  5.  Patient will be able to lift at least 5 pounds from a low stool to simulate lifting her pots from a low shelf without being limited by her low back pain.  Baseline:  Goal status: INITIAL  PLAN: PT FREQUENCY: 2x/week  PT DURATION: 4 weeks  PLANNED INTERVENTIONS: Therapeutic exercises, Therapeutic activity, Neuromuscular re-education, Balance training, Gait training, Patient/Family education, Self Care, Joint mobilization, Stair training, Dry Needling, Electrical stimulation, Spinal mobilization, Cryotherapy, Moist heat, Traction, Manual therapy, and Re-evaluation.  PLAN FOR NEXT SESSION: nustep, lower extremity strengthening, postural reeducation, lumbar stabilization, and modalities as needed   Darlin Coco, PT 11/13/2021, 3:48 PM

## 2021-11-15 ENCOUNTER — Encounter: Payer: Self-pay | Admitting: *Deleted

## 2021-11-15 ENCOUNTER — Ambulatory Visit: Payer: 59 | Admitting: *Deleted

## 2021-11-15 DIAGNOSIS — M6281 Muscle weakness (generalized): Secondary | ICD-10-CM

## 2021-11-15 DIAGNOSIS — M5459 Other low back pain: Secondary | ICD-10-CM

## 2021-11-15 DIAGNOSIS — R2681 Unsteadiness on feet: Secondary | ICD-10-CM

## 2021-11-15 DIAGNOSIS — M5416 Radiculopathy, lumbar region: Secondary | ICD-10-CM

## 2021-11-15 NOTE — Therapy (Signed)
OUTPATIENT PHYSICAL THERAPY THORACOLUMBAR TREATMENT   Patient Name: Kayla Berger MRN: [id number] DOB:02-09-1960, 61 y.o., female Today's Date: 11/15/2021   PT End of Session - 11/15/21 0815     Visit Number 3    Number of Visits 8    Date for PT Re-Evaluation 12/21/21    PT Start Time 0815    PT Stop Time 0905    PT Time Calculation (min) 50 min              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   Migraines    occ   PCOS (polycystic ovarian syndrome) 1986   PONV (postoperative nausea and vomiting)    Renal disorder    Shoulder pain    Thyroid disease    Trochanteric bursitis of left hip    Vaginal cysts    4 have been removed   Past Surgical History:  Procedure Laterality Date   APPENDECTOMY  1967   CARDIAC CATHETERIZATION Bilateral    cataracts   CHOLECYSTECTOMY N/A 02/16/2021   Procedure: LAPAROSCOPIC CHOLECYSTECTOMY;  Surgeon: [name], MD;  Location: [facility] OR;  Service: General;  Laterality: N/A;   KYPHOPLASTY N/A 10/31/2016   Procedure: KYPHOPLASTY LUMBAR ONE, LUMBAR TWO AND  LUMBAR THREE;  Surgeon: Venita Lick, MD;  Location: [facility] OR;  Service: Orthopedics;  Laterality: N/A;   nsvd  2001   normal vaginal del   sonobysterogram, laporoscopy  1997   fertility test   Patient Active Problem List   Diagnosis Date Noted   Malnutrition of moderate degree 08/07/2021   Acute kidney failure (HCC) 08/02/2021   Hyperkalemia, diminished renal excretion 08/02/2021   Lactic acidosis 08/02/2021   Closed compression fracture of L2 lumbar vertebra 10/31/2016   Hyperlipidemia 09/23/2012   Hypothyroid 09/23/2012   Allergic rhinitis 09/23/2012   History of  migraine headaches 09/23/2012   DM2 (diabetes mellitus, type 2) (HCC) 06/26/2012   HTN (hypertension) 06/26/2012    PCP: Irena Reichmann, DO  REFERRING PROVIDER: Donalee Citrin, MD  REFERRING DIAG: Spondylosis without myelopathy or radiculopathy, lumbar region  Rationale for Evaluation and Treatment Rehabilitation  THERAPY DIAG:  Other low back pain  Radiculopathy, lumbar region  Muscle weakness (generalized)  Unsteadiness on feet  ONSET DATE: chronic  SUBJECTIVE:  SUBJECTIVE STATEMENT: Patient reports that her back is hurting quite a bit this morning. Trying to change positions more  PERTINENT HISTORY:  Neuropathy in both feet, bilateral shoulder pain, osteoporosis, HTN, DM, OA  PAIN:  Are you having pain? Yes: NPRS scale: 6/10 Pain location: low back radiating down the back of her leg, but never goes past the knee (R>L)  Pain description: ache, sore, sharp, and constant  Aggravating factors: walking up and down hills, sitting for prolonged periods (about 2-3 hours), bending over, lifting, cleaning, sweeping, and mopping Relieving factors: getting up and walking for short periods   PRECAUTIONS: Fall  WEIGHT BEARING RESTRICTIONS No  FALLS:  Has patient fallen in last 6 months? Yes. 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)   PLOF: Independent  PATIENT GOALS walk longer (she is only able to walk 5-10 minutes currently), lifting her pots  and pans from a low shelf, unable to clean   OBJECTIVE: all objective measures were assess at her initial evaluation on 11/07/21 unless otherwise noted  SCREENING FOR RED FLAGS: Bowel or bladder incontinence: No Spinal tumors: No Cauda equina syndrome: No Compression fracture: No Abdominal aneurysm: No  COGNITION:  Overall cognitive status: Within functional limits for tasks assessed     SENSATION: Patient reports numbness in both feet due to diabetic neuropathy  POSTURE: rounded shoulders, forward head, and decreased lumbar lordosis  PALPATION: TTP: bilateral lumbar paraspinals, QL, gluteals, piriformis, TFL, and IT band  LUMBAR ROM:   Active  A/PROM  eval  Flexion 18; familiar low back pain   Extension 12; familiar low back pain   Right lateral flexion 75% limited; limited by familiar low back pain  Left lateral flexion 75% limited; limited by familiar low back pain  Right rotation 50% limited; limited by familiar low back pain   Left rotation 50% limited; limited by familiar low back pain   (Blank rows = not tested)  LOWER EXTREMITY ROM: WFL for activities assessed    LOWER EXTREMITY MMT:  increased her familiar pain from 5-6/10 to 7/10  MMT Right eval Left eval  Hip flexion 4-/5 4/5  Hip extension    Hip abduction    Hip adduction    Hip internal rotation    Hip external rotation    Knee flexion 4+/5 reproduced low back pain into right hip 4+/5  Knee extension 4+/5 5/5  Ankle dorsiflexion 4/5 4/5  Ankle plantarflexion    Ankle inversion    Ankle eversion     (Blank rows = not tested)  FUNCTIONAL TESTS:  5 times sit to stand: 27.92 seconds w/o UE support  GAIT: Assistive device utilized: None Level of assistance: Complete Independence Comments: decreased gait speed and stride length  TODAY'S TREATMENT                                    10/ 19 EXERCISE LOG  Exercise Repetitions and Resistance Comments  Nustep L2 x 10 minutes Limited by fatigue   Isometric ball press 20 reps w/ 5 second hold standing  AB bracing X10 hold 5 secs   Bridging 2x10  slow and controlled.   Ball roll out     [employer] overcorrect 2 x10 and find neutral position "Feels good"   Seated HS stretch  RLE: nonpainful LLE attempted, but painful  Standing knee flexion  Alternating LE    Blank cell = exercise not performed today   Reviewed and discussed postures and movement patterns with AB bracing to stabilize during transitional movements.  Modalities  Date:  Unattended Estim: Lumbar, IFC @ 80-150 Hz w/ 40% scan, 15 mins, Pain  PATIENT EDUCATION:  Education details: POC, healing, prognosis, dry needling Person educated: Patient Education method: Explanation Education comprehension: verbalized understanding   HOME EXERCISE PROGRAM:   ASSESSMENT:  CLINICAL IMPRESSION:  Pt arrived today doing fair with LB, but reports being sore after last Rx. Rx focused on core activation and AB bracing exs as well as movement patterns to help decrese pain with ADL's and transitional movement patterns. HMP and IFC end of session for pain control.     OBJECTIVE IMPAIRMENTS Abnormal gait, decreased activity tolerance, decreased balance, decreased mobility, difficulty walking, decreased ROM, decreased strength, hypomobility, impaired flexibility, impaired sensation, impaired tone, postural dysfunction, and pain.   ACTIVITY LIMITATIONS carrying, lifting, bending, sitting, standing, squatting, sleeping, stairs, transfers, and locomotion level  PARTICIPATION LIMITATIONS: meal prep, cleaning, laundry, shopping, community activity, occupation, and yard work  PERSONAL FACTORS Past/current experiences, Profession, Time since onset of injury/illness/exacerbation, and 3+ comorbidities: HTN, DM, OA, bilateral shoulder pain, osteoporosis, and diabetic neuropathy  are also affecting patient's functional outcome.   REHAB POTENTIAL: Fair    CLINICAL DECISION  MAKING: Evolving/moderate complexity  EVALUATION COMPLEXITY: Moderate   GOALS: Goals reviewed with patient? No  LONG TERM GOALS: Target date: 12/05/2021  Patient will be independent with her HEP.  Baseline:  Goal status: INITIAL  2.  Patient will be able to complete her daily activities without her familiar pain exceeding 6/10.  Baseline:  Goal status: INITIAL  3.  Patient will be able to demonstrate at least 25 degrees of lumbar flexion for improved function picking items up from a low shelf.  Baseline:  Goal status: INITIAL  4.  Patient will be able to stand and walk at least 20 minutes without being limited by her familiar low back pain.  Baseline:  Goal status: INITIAL  5.  Patient will be able to lift at least 5 pounds from a low stool to simulate lifting her pots from a low shelf without being limited by her low back pain.  Baseline:  Goal status: INITIAL  PLAN: PT FREQUENCY: 2x/week  PT DURATION: 4 weeks  PLANNED INTERVENTIONS: Therapeutic exercises, Therapeutic activity, Neuromuscular re-education, Balance training, Gait training, Patient/Family education, Self Care, Joint mobilization, Stair training, Dry Needling, Electrical stimulation, Spinal mobilization, Cryotherapy, Moist heat, Traction, Manual therapy, and Re-evaluation.  PLAN FOR NEXT SESSION: nustep, lower extremity strengthening, postural reeducation, lumbar stabilization, and modalities as needed   Breyanna Valera,CHRIS, PTA 11/15/2021, 5:46 PM

## 2021-11-20 ENCOUNTER — Ambulatory Visit: Payer: 59

## 2021-11-20 DIAGNOSIS — M5459 Other low back pain: Secondary | ICD-10-CM | POA: Diagnosis not present

## 2021-11-20 NOTE — Therapy (Signed)
OUTPATIENT PHYSICAL THERAPY THORACOLUMBAR TREATMENT   Patient Name: Kayla Berger MRN: [id number] DOB:[birth year], 61 y.o., female Today's Date: 11/20/2021   PT End of Session - 11/20/21 0736     Visit Number 4    Number of Visits 8    Date for PT Re-Evaluation 12/21/21    PT Start Time 0734    PT Stop Time 0815    PT Time Calculation (min) 41 min    Activity Tolerance Patient tolerated treatment well    Behavior During Therapy WFL for tasks assessed/performed               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   Migraines    occ   PCOS (polycystic ovarian syndrome) 1986   PONV (postoperative nausea and vomiting)    Renal disorder    Shoulder pain    Thyroid disease    Trochanteric bursitis of left hip    Vaginal cysts    4 have been removed   Past Surgical History:  Procedure Laterality Date   APPENDECTOMY  1967   CARDIAC CATHETERIZATION Bilateral    cataracts   CHOLECYSTECTOMY N/A 02/16/2021   Procedure: LAPAROSCOPIC CHOLECYSTECTOMY;  Surgeon: [name], MD;  Location: [facility] OR;  Service: General;  Laterality: N/A;   KYPHOPLASTY N/A 10/31/2016   Procedure: KYPHOPLASTY LUMBAR ONE, LUMBAR TWO AND  LUMBAR THREE;  Surgeon: Venita Lick, MD;  Location: [facility] OR;  Service: Orthopedics;  Laterality: N/A;   nsvd  2001   normal vaginal del   sonobysterogram, laporoscopy  1997   fertility test   Patient Active Problem List   Diagnosis Date Noted   Malnutrition of moderate degree 08/07/2021   Acute kidney failure (HCC) 08/02/2021   Hyperkalemia, diminished renal excretion 08/02/2021   Lactic acidosis 08/02/2021   Closed compression fracture of L2 lumbar  vertebra 10/31/2016   Hyperlipidemia 09/23/2012   Hypothyroid 09/23/2012   Allergic rhinitis 09/23/2012   History of migraine headaches 09/23/2012   DM2 (diabetes mellitus, type 2) (HCC) 06/26/2012   HTN (hypertension) 06/26/2012    PCP: Irena Reichmann, DO  REFERRING PROVIDER: Donalee Citrin, MD  REFERRING DIAG: Spondylosis without myelopathy or radiculopathy, lumbar region  Rationale for Evaluation and Treatment Rehabilitation  THERAPY DIAG:  Other low back pain  ONSET DATE: chronic  SUBJECTIVE:  SUBJECTIVE STATEMENT: Patient reports that she was sore after her last appointment. However, she notes that she was really hurting (8/10) over the weekend with her normal daily activities. She notes that her back feels like "crap today."  PERTINENT HISTORY:  Neuropathy in both feet, bilateral shoulder pain, osteoporosis, HTN, DM, OA  PAIN:  Are you having pain? Yes: NPRS scale: 6/10 Pain location: low back radiating down the back of her leg, but never goes past the knee (R>L)  Pain description: ache, sore, sharp, and constant  Aggravating factors: walking up and down hills, sitting for prolonged periods (about 2-3 hours), bending over, lifting, cleaning, sweeping, and mopping Relieving factors: getting up and walking for short periods   PRECAUTIONS: Fall  WEIGHT BEARING RESTRICTIONS No  FALLS:  Has patient fallen in last 6 months? Yes. 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)   PLOF: Independent  PATIENT GOALS walk longer (she is only able to walk 5-10 minutes currently), lifting her pots and pans from a low shelf, unable to clean   OBJECTIVE: all objective measures were assess at her initial evaluation on 11/07/21 unless otherwise noted  SCREENING FOR RED FLAGS: Bowel or bladder incontinence: No Spinal tumors: No Cauda equina syndrome: No Compression fracture: No Abdominal aneurysm: No  COGNITION:  Overall cognitive status: Within functional limits for tasks assessed     SENSATION: Patient reports numbness in both feet due to diabetic neuropathy  POSTURE: rounded shoulders, forward head, and decreased lumbar lordosis  PALPATION: TTP: bilateral lumbar paraspinals, QL, gluteals, piriformis, TFL, and IT band  LUMBAR ROM:   Active  A/PROM  eval  Flexion 18; familiar low back pain   Extension 12; familiar low back pain   Right lateral flexion 75% limited; limited by familiar low back pain  Left lateral flexion 75% limited; limited by familiar low back pain  Right rotation 50% limited; limited by familiar low back pain   Left rotation 50% limited; limited by familiar low back pain   (Blank rows = not tested)  LOWER EXTREMITY ROM: WFL for activities assessed    LOWER EXTREMITY MMT:  increased her familiar pain from 5-6/10 to 7/10  MMT Right eval Left eval  Hip flexion 4-/5 4/5  Hip extension    Hip abduction    Hip adduction    Hip internal rotation    Hip external rotation    Knee flexion 4+/5 reproduced low back pain into right hip 4+/5  Knee extension 4+/5 5/5  Ankle dorsiflexion 4/5 4/5  Ankle plantarflexion    Ankle inversion    Ankle eversion     (Blank rows = not tested)  FUNCTIONAL TESTS:  5 times sit to stand: 27.92 seconds w/o UE support  GAIT: Assistive device utilized: None Level of assistance: Complete Independence Comments: decreased gait speed and stride length  TODAY'S TREATMENT                                     10/24 EXERCISE LOG  Exercise Repetitions and Resistance Comments  Nustep L2 x  11.5 minutes   Isometric ball press 25 reps w/ 5 second hold    Squatting  20 reps  With red ball behind back to promote upright posture  Isometric lumbar extension 20 reps w/ 5 second hold   Standing open books  15 reps each             Blank cell = exercise not performed today  Modalities  Date:  Unattended Estim: Lumbar, IFC @ 80-150 Hz w/ 40% scan, 11 mins, Pain Hot Pack: Lumbar, 10 mins, Pain                                   10/ 19 EXERCISE LOG  Exercise Repetitions and Resistance Comments  Nustep L2 x 10 minutes Limited by fatigue  Isometric ball press 20 reps w/ 5 second hold standing   AB bracing X10 hold 5 secs   Bridging 2x10  slow and controlled.   Ball roll out     [employer] overcorrect 2 x10 and find neutral position "Feels good"   Seated HS stretch  RLE: nonpainful LLE attempted, but painful  Standing knee flexion  Alternating LE    Blank cell = exercise not performed today   Reviewed and discussed postures and movement patterns with AB bracing to stabilize during transitional movements.  Modalities  Date:  Unattended Estim: Lumbar, IFC @ 80-150 Hz w/ 40% scan, 15 mins, Pain  PATIENT EDUCATION:  Education details: POC, healing, prognosis, dry needling Person educated: Patient Education method: Explanation Education comprehension: verbalized understanding   HOME EXERCISE PROGRAM:   ASSESSMENT:  CLINICAL IMPRESSION:  Patient was progressed with mini squats and multiple new interventions to promote lumbar stability and upright posture. She required minimal cueing with today's new interventions for upright posture and core stability to facilitate lumbar musculature engagement. She reported no increase in familiar low back symptoms with any of today's interventions. She reported feeling better upon the conclusion of treatment. She  continues to require skilled physical therapy to address her remaining impairments to maximize her functional mobility.   OBJECTIVE IMPAIRMENTS Abnormal gait, decreased activity tolerance, decreased balance, decreased mobility, difficulty walking, decreased ROM, decreased strength, hypomobility, impaired flexibility, impaired sensation, impaired tone, postural dysfunction, and pain.   ACTIVITY LIMITATIONS carrying, lifting, bending, sitting, standing, squatting, sleeping, stairs, transfers, and locomotion level  PARTICIPATION LIMITATIONS: meal prep, cleaning, laundry, shopping, community activity, occupation, and yard work  PERSONAL FACTORS Past/current experiences, Profession, Time since onset of injury/illness/exacerbation, and 3+ comorbidities: HTN, DM, OA, bilateral shoulder pain, osteoporosis, and diabetic neuropathy  are also affecting patient's functional outcome.   REHAB POTENTIAL: Fair    CLINICAL DECISION MAKING: Evolving/moderate complexity  EVALUATION COMPLEXITY: Moderate   GOALS: Goals reviewed with patient? No  LONG TERM GOALS: Target date: 12/05/2021  Patient will be independent with her HEP.  Baseline:  Goal status: INITIAL  2.  Patient will be able to complete her daily activities without her familiar pain exceeding 6/10.  Baseline:  Goal status: INITIAL  3.  Patient will be able to demonstrate at least 25 degrees of lumbar flexion for improved function picking items up from a low shelf.  Baseline:  Goal status: INITIAL  4.  Patient will be able to stand and walk at least 20 minutes without being limited by her familiar low back pain.  Baseline:  Goal status: INITIAL  5.  Patient will be able  to lift at least 5 pounds from a low stool to simulate lifting her pots from a low shelf without being limited by her low back pain.  Baseline:  Goal status: INITIAL  PLAN: PT FREQUENCY: 2x/week  PT DURATION: 4 weeks  PLANNED INTERVENTIONS: Therapeutic exercises,  Therapeutic activity, Neuromuscular re-education, Balance training, Gait training, Patient/Family education, Self Care, Joint mobilization, Stair training, Dry Needling, Electrical stimulation, Spinal mobilization, Cryotherapy, Moist heat, Traction, Manual therapy, and Re-evaluation.  PLAN FOR NEXT SESSION: nustep, lower extremity strengthening, postural reeducation, lumbar stabilization, and modalities as needed   Granville Lewis, PT 11/20/2021, 8:18 AM

## 2021-11-22 ENCOUNTER — Ambulatory Visit: Payer: 59 | Admitting: *Deleted

## 2021-11-22 ENCOUNTER — Encounter: Payer: Self-pay | Admitting: *Deleted

## 2021-11-22 DIAGNOSIS — M5459 Other low back pain: Secondary | ICD-10-CM | POA: Diagnosis not present

## 2021-11-22 DIAGNOSIS — M6281 Muscle weakness (generalized): Secondary | ICD-10-CM

## 2021-11-22 DIAGNOSIS — R2681 Unsteadiness on feet: Secondary | ICD-10-CM

## 2021-11-22 DIAGNOSIS — M5416 Radiculopathy, lumbar region: Secondary | ICD-10-CM

## 2021-11-22 NOTE — Therapy (Signed)
OUTPATIENT PHYSICAL THERAPY THORACOLUMBAR TREATMENT   Patient Name: Kayla Berger MRN: [id number] DOB:1960-10-06, 61 y.o., female Today's Date: 11/22/2021   PT End of Session - 11/22/21 0826     Visit Number 5    Number of Visits 8    Date for PT Re-Evaluation 12/21/21    PT Start Time 0820    PT Stop Time 0908    PT Time Calculation (min) 48 min               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   Migraines    occ   PCOS (polycystic ovarian syndrome) 1986   PONV (postoperative nausea and vomiting)    Renal disorder    Shoulder pain    Thyroid disease    Trochanteric bursitis of left hip    Vaginal cysts    4 have been removed   Past Surgical History:  Procedure Laterality Date   APPENDECTOMY  1967   CARDIAC CATHETERIZATION Bilateral    cataracts   CHOLECYSTECTOMY N/A 02/16/2021   Procedure: LAPAROSCOPIC CHOLECYSTECTOMY;  Surgeon: [name], MD;  Location: [facility] OR;  Service: General;  Laterality: N/A;   KYPHOPLASTY N/A 10/31/2016   Procedure: KYPHOPLASTY LUMBAR ONE, LUMBAR TWO AND  LUMBAR THREE;  Surgeon: Venita Lick, MD;  Location: [facility] OR;  Service: Orthopedics;  Laterality: N/A;   nsvd  2001   normal vaginal del   sonobysterogram, laporoscopy  1997   fertility test   Patient Active Problem List   Diagnosis Date Noted   Malnutrition of moderate degree 08/07/2021   Acute kidney failure (HCC) 08/02/2021   Hyperkalemia, diminished renal excretion 08/02/2021   Lactic acidosis 08/02/2021   Closed compression fracture of L2 lumbar vertebra 10/31/2016   Hyperlipidemia 09/23/2012   Hypothyroid 09/23/2012   Allergic rhinitis 09/23/2012   History of  migraine headaches 09/23/2012   DM2 (diabetes mellitus, type 2) (HCC) 06/26/2012   HTN (hypertension) 06/26/2012    PCP: Irena Reichmann, DO  REFERRING PROVIDER: Donalee Citrin, MD  REFERRING DIAG: Spondylosis without myelopathy or radiculopathy, lumbar region  Rationale for Evaluation and Treatment Rehabilitation  THERAPY DIAG:  Other low back pain  Radiculopathy, lumbar region  Muscle weakness (generalized)  Unsteadiness on feet  ONSET DATE: chronic  SUBJECTIVE:  SUBJECTIVE STATEMENT: Patient reports that she was sore after her last appointment. 8/10 today. Not getting better PERTINENT HISTORY:  Neuropathy in both feet, bilateral shoulder pain, osteoporosis, HTN, DM, OA  PAIN:  Are you having pain? Yes: NPRS scale: 8/10 Pain location: low back radiating down the back of her leg, but never goes past the knee (R>L)  Pain description: ache, sore, sharp, and constant  Aggravating factors: walking up and down hills, sitting for prolonged periods (about 2-3 hours), bending over, lifting, cleaning, sweeping, and mopping Relieving factors: getting up and walking for short periods   PRECAUTIONS: Fall  WEIGHT BEARING RESTRICTIONS No  FALLS:  Has patient fallen in last 6 months? Yes. 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)   PLOF: Independent  PATIENT GOALS walk longer (she is only able to walk 5-10 minutes currently), lifting her pots and pans  from a low shelf, unable to clean   OBJECTIVE: all objective measures were assess at her initial evaluation on 11/07/21 unless otherwise noted  SCREENING FOR RED FLAGS: Bowel or bladder incontinence: No Spinal tumors: No Cauda equina syndrome: No Compression fracture: No Abdominal aneurysm: No  COGNITION:  Overall cognitive status: Within functional limits for tasks assessed     SENSATION: Patient reports numbness in both feet due to diabetic neuropathy  POSTURE: rounded shoulders, forward head, and decreased lumbar lordosis  PALPATION: TTP: bilateral lumbar paraspinals, QL, gluteals, piriformis, TFL, and IT band  LUMBAR ROM:   Active  A/PROM  eval  Flexion 18; familiar low back pain   Extension 12; familiar low back pain   Right lateral flexion 75% limited; limited by familiar low back pain  Left lateral flexion 75% limited; limited by familiar low back pain  Right rotation 50% limited; limited by familiar low back pain   Left rotation 50% limited; limited by familiar low back pain   (Blank rows = not tested)  LOWER EXTREMITY ROM: WFL for activities assessed    LOWER EXTREMITY MMT:  increased her familiar pain from 5-6/10 to 7/10  MMT Right eval Left eval  Hip flexion 4-/5 4/5  Hip extension    Hip abduction    Hip adduction    Hip internal rotation    Hip external rotation    Knee flexion 4+/5 reproduced low back pain into right hip 4+/5  Knee extension 4+/5 5/5  Ankle dorsiflexion 4/5 4/5  Ankle plantarflexion    Ankle inversion    Ankle eversion     (Blank rows = not tested)  FUNCTIONAL TESTS:  5 times sit to stand: 27.92 seconds w/o UE support  GAIT: Assistive device utilized: None Level of assistance: Complete Independence Comments: decreased gait speed and stride length  TODAY'S TREATMENT                                    10/26 EXERCISE LOG  Exercise Repetitions and Resistance Comments  Nustep L2 x 10 minutes   Isometric ball press     Squatting   With red ball behind back to promote upright posture  Isometric lumbar extension    Standing open books              Blank cell = exercise not performed today   Manual STW/ TPR to Pt's RT side LB paras and QL with notable tightness and TP's Modalities  Date:  Unattended Estim: Lumbar, IFC @ 80-150 Hz w/ 40% scan, 11 mins, Pain Hot Pack: Lumbar, 15 mins, Pain in LT side lying                                   10/ 19 EXERCISE LOG  Exercise Repetitions and Resistance Comments  Nustep L2 x 10 minutes Limited by fatigue  Isometric ball press 20 reps w/ 5 second hold standing   AB bracing X10 hold 5 secs   Bridging 2x10  slow and controlled.   Ball roll out     [employer] overcorrect 2 x10 and find neutral position "Feels good"   Seated HS stretch  RLE: nonpainful LLE attempted, but painful  Standing knee flexion  Alternating LE    Blank cell = exercise not performed today   Reviewed and discussed postures and movement patterns with AB bracing to stabilize during transitional movements.  Modalities  Date:  Unattended Estim: Lumbar, IFC @ 80-150 Hz w/ 40% scan, 15 mins, Pain  PATIENT EDUCATION:  Education details: POC, healing, prognosis, dry needling Person educated: Patient Education method: Explanation Education comprehension: verbalized understanding   HOME EXERCISE PROGRAM:   ASSESSMENT:  CLINICAL IMPRESSION:  Pt arrived today not doing well due to LBP RT side. She was able to perform some exs without increased pain, but rx focused on STW to relieve TPS/ mm Tightness in RT side LB paras and QL which were very tender/painful. HMP/ IFC end of session for pain management. DN next appt.       OBJECTIVE IMPAIRMENTS Abnormal gait, decreased activity tolerance, decreased balance, decreased mobility, difficulty walking, decreased ROM, decreased strength, hypomobility, impaired flexibility, impaired sensation, impaired tone, postural dysfunction,  and pain.   ACTIVITY LIMITATIONS carrying, lifting, bending, sitting, standing, squatting, sleeping, stairs, transfers, and locomotion level  PARTICIPATION LIMITATIONS: meal prep, cleaning, laundry, shopping, community activity, occupation, and yard work  PERSONAL FACTORS Past/current experiences, Profession, Time since onset of injury/illness/exacerbation, and 3+ comorbidities: HTN, DM, OA, bilateral shoulder pain, osteoporosis, and diabetic neuropathy  are also affecting patient's functional outcome.   REHAB POTENTIAL: Fair    CLINICAL DECISION MAKING: Evolving/moderate complexity  EVALUATION COMPLEXITY: Moderate   GOALS: Goals reviewed with patient? No  LONG TERM GOALS: Target date: 12/05/2021  Patient will be independent with her HEP.  Baseline:  Goal status: INITIAL  2.  Patient will be able to complete her daily activities without her familiar pain exceeding 6/10.  Baseline:  Goal status: INITIAL  3.  Patient will be able to demonstrate at least 25 degrees of lumbar flexion for improved function picking items up from a low shelf.  Baseline:  Goal status: INITIAL  4.  Patient will be able to stand and walk at least 20 minutes without being limited by her familiar low back pain.  Baseline:  Goal status: INITIAL  5.  Patient will be able to lift at least 5 pounds from a low stool to simulate lifting her pots from a low shelf without being limited by her low back pain.  Baseline:  Goal status: INITIAL  PLAN: PT FREQUENCY:  2x/week  PT DURATION: 4 weeks  PLANNED INTERVENTIONS: Therapeutic exercises, Therapeutic activity, Neuromuscular re-education, Balance training, Gait training, Patient/Family education, Self Care, Joint mobilization, Stair training, Dry Needling, Electrical stimulation, Spinal mobilization, Cryotherapy, Moist heat, Traction, Manual therapy, and Re-evaluation.  PLAN FOR NEXT SESSION:     DN next Appt.       nustep, lower extremity strengthening,  postural reeducation, lumbar stabilization, and modalities as needed   Kaitelyn Jamison,CHRIS, PTA 11/22/2021, 1:16 PM

## 2021-11-27 ENCOUNTER — Encounter: Payer: Self-pay | Admitting: Physical Therapy

## 2021-11-27 ENCOUNTER — Ambulatory Visit: Payer: 59 | Admitting: Physical Therapy

## 2021-11-27 DIAGNOSIS — M5459 Other low back pain: Secondary | ICD-10-CM

## 2021-11-27 DIAGNOSIS — M5416 Radiculopathy, lumbar region: Secondary | ICD-10-CM

## 2021-11-27 NOTE — Therapy (Signed)
OUTPATIENT PHYSICAL THERAPY THORACOLUMBAR TREATMENT   Patient Name: Kayla Berger MRN: [id number] DOB:Dec 14, 1960, 61 y.o., female Today's Date: 11/27/2021   PT End of Session - 11/27/21 1007     Visit Number 6    Number of Visits 8    Date for PT Re-Evaluation 12/21/21    PT Start Time 0817    PT Stop Time 0909    PT Time Calculation (min) 52 min    Activity Tolerance Patient tolerated treatment well    Behavior During Therapy WFL for tasks assessed/performed               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   Migraines    occ   PCOS (polycystic ovarian syndrome) 1986   PONV (postoperative nausea and vomiting)    Renal disorder    Shoulder pain    Thyroid disease    Trochanteric bursitis of left hip    Vaginal cysts    4 have been removed   Past Surgical History:  Procedure Laterality Date   APPENDECTOMY  1967   CARDIAC CATHETERIZATION Bilateral    cataracts   CHOLECYSTECTOMY N/A 02/16/2021   Procedure: LAPAROSCOPIC CHOLECYSTECTOMY;  Surgeon: Dwan Bolt, MD;  Location: [place];  Service: General;  Laterality: N/A;   KYPHOPLASTY N/A 10/31/2016   Procedure: KYPHOPLASTY LUMBAR ONE, LUMBAR TWO AND  LUMBAR THREE;  Surgeon: Melina Schools, MD;  Location: [place];  Service: Orthopedics;  Laterality: N/A;   nsvd  2001   normal vaginal del   sonobysterogram, laporoscopy  1997   fertility test   Patient Active Problem List   Diagnosis Date Noted   Malnutrition of moderate degree 08/07/2021   Acute kidney failure ([place]) 08/02/2021   Hyperkalemia, diminished renal excretion 08/02/2021   Lactic acidosis 08/02/2021   Closed compression fracture of L2 lumbar  vertebra 10/31/2016   Hyperlipidemia 09/23/2012   Hypothyroid 09/23/2012   Allergic rhinitis 09/23/2012   History of migraine headaches 09/23/2012   DM2 (diabetes mellitus, type 2) ([place]) 06/26/2012   HTN (hypertension) 06/26/2012    PCP: Janie Morning, DO  REFERRING PROVIDER: Kary Kos, MD  REFERRING DIAG: Spondylosis without myelopathy or radiculopathy, lumbar region  Rationale for Evaluation and Treatment Rehabilitation  THERAPY DIAG:  Other low back pain  Radiculopathy, lumbar region  ONSET DATE: chronic  SUBJECTIVE:  SUBJECTIVE STATEMENT: Pian staying high. PERTINENT HISTORY:  Neuropathy in both feet, bilateral shoulder pain, osteoporosis, HTN, DM, OA  PAIN:  Are you having pain? Yes: NPRS scale: 8/10 Pain location: low back radiating down the back of her leg, but never goes past the knee (R>L)  Pain description: ache, sore, sharp, and constant  Aggravating factors: walking up and down hills, sitting for prolonged periods (about 2-3 hours), bending over, lifting, cleaning, sweeping, and mopping Relieving factors: getting up and walking for short periods   PRECAUTIONS: Fall   PATIENT GOALS walk longer (she is only able to walk 5-10 minutes currently), lifting her pots and pans from a low shelf, unable to clean   OBJECTIVE: all objective measures were assess at her initial evaluation on 11/07/21 unless otherwise noted   TODAY'S TREATMENT                                    11/27/21 EXERCISE LOG  Exercise Repetitions and Resistance Comments  Nustep L3 x 10 minutes                           Trigger Point Dry-Needling  Treatment instructions: Expect mild to moderate muscle soreness. S/S of pneumothorax if dry needled over a lung field, and to seek immediate medical attention  should they occur. Patient verbalized understanding of these instructions and education.  Patient Consent Given: Yes Education handout provided: Yes Muscles treated: Right lumbar multifidi and Iliocostalis  STW/M to patient's right lumbar musculature x 13 minutes, prone over folded pillow or comfort f/b IFC with HMP at 80-150 Hz on 40% scan x 20 minutes in supine with wedge under knees.      ASSESSMENT:  CLINICAL IMPRESSION:  He patient did very well with dry needling today.  Her right lumbar musculature exhibits a great deal of tone.  We discussed dry needling her QL next visit.  Normal modality response following removal of modality.   GOALS: Goals reviewed with patient? No  LONG TERM GOALS: Target date: 12/05/2021  Patient will be independent with her HEP.  Baseline:  Goal status: INITIAL  2.  Patient will be able to complete her daily activities without her familiar pain exceeding 6/10.  Baseline:  Goal status: INITIAL  3.  Patient will be able to demonstrate at least 25 degrees of lumbar flexion for improved function picking items up from a low shelf.  Baseline:  Goal status: INITIAL  4.  Patient will be able to stand and walk at least 20 minutes without being limited by her familiar low back pain.  Baseline:  Goal status: INITIAL  5.  Patient will be able to lift at least 5 pounds from a low stool to simulate lifting her pots from a low shelf without being limited by her low back pain.  Baseline:  Goal status: INITIAL  PLAN: PT FREQUENCY: 2x/week  PT DURATION: 4 weeks  PLANNED INTERVENTIONS: Therapeutic exercises, Therapeutic activity, Neuromuscular re-education, Balance training, Gait training, Patient/Family education, Self Care, Joint mobilization, Stair training, Dry Needling, Electrical stimulation, Spinal mobilization, Cryotherapy, Moist heat, Traction, Manual therapy, and Re-evaluation.  PLAN FOR NEXT SESSION:     DN next Appt.       nustep, lower  extremity strengthening, postural reeducation, lumbar stabilization, and modalities as needed   Jashan Cotten, [place], PT 11/27/2021, 10:17 AM

## 2021-11-29 ENCOUNTER — Ambulatory Visit: Payer: 59 | Attending: Neurosurgery | Admitting: *Deleted

## 2021-11-29 ENCOUNTER — Encounter: Payer: Self-pay | Admitting: *Deleted

## 2021-11-29 DIAGNOSIS — M5459 Other low back pain: Secondary | ICD-10-CM | POA: Diagnosis not present

## 2021-11-29 DIAGNOSIS — M5416 Radiculopathy, lumbar region: Secondary | ICD-10-CM | POA: Insufficient documentation

## 2021-11-29 DIAGNOSIS — M6281 Muscle weakness (generalized): Secondary | ICD-10-CM | POA: Diagnosis present

## 2021-11-29 NOTE — Therapy (Signed)
OUTPATIENT PHYSICAL THERAPY THORACOLUMBAR TREATMENT   Patient Name: Kayla Berger MRN: [id number] DOB:[birth year], 61 y.o., female Today's Date: 11/29/2021   PT End of Session - 11/29/21 0818     Visit Number 7    Number of Visits 8    Date for PT Re-Evaluation 12/21/21    PT Start Time 0815    PT Stop Time 0904    PT Time Calculation (min) 49 min               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   Migraines    occ   PCOS (polycystic ovarian syndrome) 1986   PONV (postoperative nausea and vomiting)    Renal disorder    Shoulder pain    Thyroid disease    Trochanteric bursitis of left hip    Vaginal cysts    4 have been removed   Past Surgical History:  Procedure Laterality Date   APPENDECTOMY  1967   CARDIAC CATHETERIZATION Bilateral    cataracts   CHOLECYSTECTOMY N/A 02/16/2021   Procedure: LAPAROSCOPIC CHOLECYSTECTOMY;  Surgeon: Dwan Bolt, MD;  Location: [place];  Service: General;  Laterality: N/A;   KYPHOPLASTY N/A 10/31/2016   Procedure: KYPHOPLASTY LUMBAR ONE, LUMBAR TWO AND  LUMBAR THREE;  Surgeon: Melina Schools, MD;  Location: [place];  Service: Orthopedics;  Laterality: N/A;   nsvd  2001   normal vaginal del   sonobysterogram, laporoscopy  1997   fertility test   Patient Active Problem List   Diagnosis Date Noted   Malnutrition of moderate degree 08/07/2021   Acute kidney failure ([place]) 08/02/2021   Hyperkalemia, diminished renal excretion 08/02/2021   Lactic acidosis 08/02/2021   Closed compression fracture of L2 lumbar vertebra 10/31/2016   Hyperlipidemia 09/23/2012   Hypothyroid 09/23/2012   Allergic rhinitis 09/23/2012   History of  migraine headaches 09/23/2012   DM2 (diabetes mellitus, type 2) ([place]) 06/26/2012   HTN (hypertension) 06/26/2012    PCP: Janie Morning, DO  REFERRING PROVIDER: Kary Kos, MD  REFERRING DIAG: Spondylosis without myelopathy or radiculopathy, lumbar region  Rationale for Evaluation and Treatment Rehabilitation  THERAPY DIAG:  Other low back pain  Radiculopathy, lumbar region  Muscle weakness (generalized)  ONSET DATE: chronic  SUBJECTIVE:  SUBJECTIVE STATEMENT:  Did good after DN. Pain today 5/10 Want to DN QL. To MD Tuesday    PERTINENT HISTORY:  Neuropathy in both feet, bilateral shoulder pain, osteoporosis, HTN, DM, OA  PAIN:  Are you having pain? Yes: NPRS scale: 5/10 Pain location: low back radiating down the back of her leg, but never goes past the knee (R>L)  Pain description: ache, sore, sharp, and constant  Aggravating factors: walking up and down hills, sitting for prolonged periods (about 2-3 hours), bending over, lifting, cleaning, sweeping, and mopping Relieving factors: getting up and walking for short periods   PRECAUTIONS: Fall   PATIENT GOALS walk longer (she is only able to walk 5-10 minutes currently), lifting her pots and pans from a low shelf, unable to clean   OBJECTIVE: all objective measures were assess at her initial evaluation on 11/07/21 unless otherwise noted   TODAY'S TREATMENT                                    11/29/21 EXERCISE LOG  Exercise Repetitions and Resistance Comments  Nustep L3 x 10 minutes                             STW/M to patient's right lumbar musculature x 13 minutes, prone over folded pillow or comfort f/b  IFC with HMP at 80-150 Hz on 40% scan x 15 minutes in prone    ASSESSMENT:  CLINICAL IMPRESSION:  Pt arrived today  doing some better with decreased LBP 5/10. She reports being sore from DN, but wants to try it again for QL. She was able to continue with Exs as well as STW, but still very tender a long RT side paras. IFC and HMP end of session with decreased MM soreness end of session. GOALS: Goals reviewed with patient? No  LONG TERM GOALS: Target date: 12/05/2021  Patient will be independent with her HEP.  Baseline:  Goal status: Partially Met  2.  Patient will be able to complete her daily activities without her familiar pain exceeding 6/10.  Baseline:  Goal status: In progress  3.  Patient will be able to demonstrate at least 25 degrees of lumbar flexion for improved function picking items up from a low shelf.  Baseline:  Goal status: In progress  4.  Patient will be able to stand and walk at least 20 minutes without being limited by her familiar low back pain.  Baseline:  Goal status: In progress  5.  Patient will be able to lift at least 5 pounds from a low stool to simulate lifting her pots from a low shelf without being limited by her low back pain.  Baseline:  Goal status: In progress PLAN: PT FREQUENCY: 2x/week  PT DURATION: 4 weeks  PLANNED INTERVENTIONS: Therapeutic exercises, Therapeutic activity, Neuromuscular re-education, Balance training, Gait training, Patient/Family education, Self Care, Joint mobilization, Stair training, Dry Needling, Electrical stimulation, Spinal mobilization, Cryotherapy, Moist heat, Traction, Manual therapy, and Re-evaluation.  PLAN FOR NEXT SESSION:     DN next Appt.       nustep, lower extremity strengthening, postural reeducation, lumbar stabilization, and modalities as needed   Keyon Liller,CHRIS, PTA 11/29/2021, 11:41 AM

## 2021-12-03 ENCOUNTER — Ambulatory Visit: Payer: 59 | Admitting: Physical Therapy

## 2021-12-03 ENCOUNTER — Encounter: Payer: Self-pay | Admitting: Physical Therapy

## 2021-12-03 DIAGNOSIS — M5459 Other low back pain: Secondary | ICD-10-CM | POA: Diagnosis not present

## 2021-12-03 NOTE — Therapy (Signed)
OUTPATIENT PHYSICAL THERAPY THORACOLUMBAR TREATMENT   Patient Name: Kayla Berger MRN: [id number] DOB:[birth year], 61 y.o., female Today's Date: 12/03/2021   PT End of Session - 12/03/21 0858     Visit Number 8    Number of Visits 8    Date for PT Re-Evaluation 12/21/21    PT Start Time 0817    PT Stop Time 0909    PT Time Calculation (min) 52 min    Activity Tolerance Patient tolerated treatment well    Behavior During Therapy WFL for tasks assessed/performed               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   Migraines    occ   PCOS (polycystic ovarian syndrome) 1986   PONV (postoperative nausea and vomiting)    Renal disorder    Shoulder pain    Thyroid disease    Trochanteric bursitis of left hip    Vaginal cysts    4 have been removed   Past Surgical History:  Procedure Laterality Date   APPENDECTOMY  1967   CARDIAC CATHETERIZATION Bilateral    cataracts   CHOLECYSTECTOMY N/A 02/16/2021   Procedure: LAPAROSCOPIC CHOLECYSTECTOMY;  Surgeon: Dwan Bolt, MD;  Location: [place];  Service: General;  Laterality: N/A;   KYPHOPLASTY N/A 10/31/2016   Procedure: KYPHOPLASTY LUMBAR ONE, LUMBAR TWO AND  LUMBAR THREE;  Surgeon: Melina Schools, MD;  Location: [place];  Service: Orthopedics;  Laterality: N/A;   nsvd  2001   normal vaginal del   sonobysterogram, laporoscopy  1997   fertility test   Patient Active Problem List   Diagnosis Date Noted   Malnutrition of moderate degree 08/07/2021   Acute kidney failure ([place]) 08/02/2021   Hyperkalemia, diminished renal excretion 08/02/2021   Lactic acidosis 08/02/2021   Closed compression fracture of L2 lumbar  vertebra 10/31/2016   Hyperlipidemia 09/23/2012   Hypothyroid 09/23/2012   Allergic rhinitis 09/23/2012   History of migraine headaches 09/23/2012   DM2 (diabetes mellitus, type 2) ([place]) 06/26/2012   HTN (hypertension) 06/26/2012    PCP: Janie Morning, DO  REFERRING PROVIDER: Kary Kos, MD  REFERRING DIAG: Spondylosis without myelopathy or radiculopathy, lumbar region  Rationale for Evaluation and Treatment Rehabilitation  THERAPY DIAG:  Other low back pain  ONSET DATE: chronic  SUBJECTIVE:  SUBJECTIVE STATEMENT: Good response to dry needling. PERTINENT HISTORY:  Neuropathy in both feet, bilateral shoulder pain, osteoporosis, HTN, DM, OA  PAIN:  Are you having pain? Yes: NPRS scale: 4/10 Pain location: low back radiating down the back of her leg, but never goes past the knee (R>L)  Pain description: ache, sore, sharp, and constant  Aggravating factors: walking up and down hills, sitting for prolonged periods (about 2-3 hours), bending over, lifting, cleaning, sweeping, and mopping Relieving factors: getting up and walking for short periods   PRECAUTIONS: Fall   PATIENT GOALS walk longer (she is only able to walk 5-10 minutes currently), lifting her pots and pans from a low shelf, unable to clean   OBJECTIVE: all objective measures were assess at her initial evaluation on 11/07/21 unless otherwise noted   TODAY'S TREATMENT                                    12/03/21 EXERCISE LOG  Exercise Repetitions and Resistance Comments  Nustep L3 x 10 minutes                           Trigger Point Dry-Needling  Treatment instructions: Expect mild to moderate muscle soreness. S/S of pneumothorax if dry needled over a lung field, and to seek immediate medical attention should they occur.  Patient verbalized understanding of these instructions and education.  Patient Consent Given: Yes Education handout provided: Yes Muscles treated: Right lumbar multifidi and Iliocostalis  STW/M to patient's right lumbar musculature x 13 minutes, prone over folded pillow or comfort f/b IFC with HMP at 80-150 Hz on 40% scan x 20 minutes in supine with wedge under knees.      ASSESSMENT:  CLINICAL IMPRESSION:  Pain lowered since dry needling.  Patient to see Dr. Wynetta Emery this week.  Would like continue PT and dry needle her right QL.  GOALS: Goals reviewed with patient? No  LONG TERM GOALS: Target date: 12/05/2021  Patient will be independent with her HEP.  Baseline:  Goal status: INITIAL  2.  Patient will be able to complete her daily activities without her familiar pain exceeding 6/10.  Baseline:  Goal status: INITIAL  3.  Patient will be able to demonstrate at least 25 degrees of lumbar flexion for improved function picking items up from a low shelf.  Baseline:  Goal status: INITIAL  4.  Patient will be able to stand and walk at least 20 minutes without being limited by her familiar low back pain.  Baseline:  Goal status: INITIAL  5.  Patient will be able to lift at least 5 pounds from a low stool to simulate lifting her pots from a low shelf without being limited by her low back pain.  Baseline:  Goal status: INITIAL  PLAN: PT FREQUENCY: 2x/week  PT DURATION: 4 weeks  PLANNED INTERVENTIONS: Therapeutic exercises, Therapeutic activity, Neuromuscular re-education, Balance training, Gait training, Patient/Family education, Self Care, Joint mobilization, Stair training, Dry Needling, Electrical stimulation, Spinal mobilization, Cryotherapy, Moist heat, Traction, Manual therapy, and Re-evaluation.  PLAN FOR NEXT SESSION:     DN next Appt.       nustep, lower extremity strengthening, postural reeducation, lumbar stabilization, and modalities as needed   Drew Lips, [place],  PT 12/03/2021, 9:34 AM

## 2022-01-11 ENCOUNTER — Ambulatory Visit: Payer: 59 | Admitting: Cardiology

## 2022-01-18 ENCOUNTER — Other Ambulatory Visit: Payer: Self-pay

## 2022-01-18 ENCOUNTER — Encounter (HOSPITAL_COMMUNITY): Payer: Self-pay

## 2022-01-18 ENCOUNTER — Emergency Department (HOSPITAL_COMMUNITY)
Admission: EM | Admit: 2022-01-18 | Discharge: 2022-01-19 | Disposition: A | Discharge status: Home / Self Care | Payer: 59 | Attending: Emergency Medicine | Admitting: Emergency Medicine

## 2022-01-18 ENCOUNTER — Emergency Department (HOSPITAL_COMMUNITY): Payer: 59

## 2022-01-18 DIAGNOSIS — S20211A Contusion of right front wall of thorax, initial encounter: Secondary | ICD-10-CM | POA: Insufficient documentation

## 2022-01-18 DIAGNOSIS — Z7982 Long term (current) use of aspirin: Secondary | ICD-10-CM | POA: Insufficient documentation

## 2022-01-18 DIAGNOSIS — S22089A Unspecified fracture of T11-T12 vertebra, initial encounter for closed fracture: Secondary | ICD-10-CM | POA: Diagnosis not present

## 2022-01-18 DIAGNOSIS — S299XXA Unspecified injury of thorax, initial encounter: Secondary | ICD-10-CM | POA: Diagnosis present

## 2022-01-18 DIAGNOSIS — W010XXA Fall on same level from slipping, tripping and stumbling without subsequent striking against object, initial encounter: Secondary | ICD-10-CM | POA: Insufficient documentation

## 2022-01-18 DIAGNOSIS — Z794 Long term (current) use of insulin: Secondary | ICD-10-CM | POA: Insufficient documentation

## 2022-01-18 DIAGNOSIS — Z79899 Other long term (current) drug therapy: Secondary | ICD-10-CM | POA: Insufficient documentation

## 2022-01-18 DIAGNOSIS — Z7984 Long term (current) use of oral hypoglycemic drugs: Secondary | ICD-10-CM | POA: Diagnosis not present

## 2022-01-18 DIAGNOSIS — S22080A Wedge compression fracture of T11-T12 vertebra, initial encounter for closed fracture: Secondary | ICD-10-CM

## 2022-01-18 NOTE — ED Provider Triage Note (Signed)
Emergency Medicine Provider Triage Evaluation Note  Kayla Berger , a 61 y.o. female  was evaluated in triage.  Pt complains of right rib pain, hip pain since mechanical fall when tripping on uneven sidewalk. Hx of osteoporosis.  Review of Systems  Positive: As above  Negative: As above  Physical Exam  BP 121/78 (BP Location: Left Arm)   Pulse 82   Temp 97.6 F (36.4 C)   Resp 14   LMP 10/06/2012   SpO2 98%  Gen:   Awake, no distress   Resp:  Normal effort  MSK:   Moves extremities without difficulty  Other:  TTP over the right anterolateral ribs, lungs CTAB.   Medical Decision Making  Medically screening exam initiated at 11:03 PM.  Appropriate orders placed.  Roneisha L Angelini was informed that the remainder of the evaluation will be completed by another provider, this initial triage assessment does not replace that evaluation, and the importance of remaining in the ED until their evaluation is complete.  This chart was dictated using voice recognition software, Dragon. Despite the best efforts of this provider to proofread and correct errors, errors may still occur which can change documentation meaning.    Paris Lore, PA-C 01/18/22 2311

## 2022-01-18 NOTE — ED Triage Notes (Signed)
Pt states she tripped over uneven pavement and landed onto right side. Pt states the pain is all over back and abdomen. Pt states she had two injections in back yesterday.

## 2022-01-19 ENCOUNTER — Emergency Department (HOSPITAL_COMMUNITY): Payer: 59

## 2022-01-19 LAB — CBC WITH DIFFERENTIAL/PLATELET
Abs Immature Granulocytes: 0.02 10*3/uL (ref 0.00–0.07)
Basophils Absolute: 0.1 10*3/uL (ref 0.0–0.1)
Basophils Relative: 1 %
Eosinophils Absolute: 0.3 10*3/uL (ref 0.0–0.5)
Eosinophils Relative: 4 %
HCT: 38.1 % (ref 36.0–46.0)
Hemoglobin: 12.3 g/dL (ref 12.0–15.0)
Immature Granulocytes: 0 %
Lymphocytes Relative: 29 %
Lymphs Abs: 2 10*3/uL (ref 0.7–4.0)
MCH: 29.9 pg (ref 26.0–34.0)
MCHC: 32.3 g/dL (ref 30.0–36.0)
MCV: 92.5 fL (ref 80.0–100.0)
Monocytes Absolute: 0.5 10*3/uL (ref 0.1–1.0)
Monocytes Relative: 8 %
Neutro Abs: 4.1 10*3/uL (ref 1.7–7.7)
Neutrophils Relative %: 58 %
Platelets: 220 10*3/uL (ref 150–400)
RBC: 4.12 MIL/uL (ref 3.87–5.11)
RDW: 14.5 % (ref 11.5–15.5)
WBC: 6.9 10*3/uL (ref 4.0–10.5)
nRBC: 0 % (ref 0.0–0.2)

## 2022-01-19 LAB — I-STAT CHEM 8, ED
BUN: 31 mg/dL — ABNORMAL HIGH (ref 8–23)
Calcium, Ion: 1.12 mmol/L — ABNORMAL LOW (ref 1.15–1.40)
Chloride: 103 mmol/L (ref 98–111)
Creatinine, Ser: 1.2 mg/dL — ABNORMAL HIGH (ref 0.44–1.00)
Glucose, Bld: 138 mg/dL — ABNORMAL HIGH (ref 70–99)
HCT: 38 % (ref 36.0–46.0)
Hemoglobin: 12.9 g/dL (ref 12.0–15.0)
Potassium: 4.5 mmol/L (ref 3.5–5.1)
Sodium: 141 mmol/L (ref 135–145)
TCO2: 28 mmol/L (ref 22–32)

## 2022-01-19 LAB — COMPREHENSIVE METABOLIC PANEL
ALT: 25 U/L (ref 0–44)
AST: 29 U/L (ref 15–41)
Albumin: 3.7 g/dL (ref 3.5–5.0)
Alkaline Phosphatase: 90 U/L (ref 38–126)
Anion gap: 10 (ref 5–15)
BUN: 23 mg/dL (ref 8–23)
CO2: 28 mmol/L (ref 22–32)
Calcium: 9 mg/dL (ref 8.9–10.3)
Chloride: 101 mmol/L (ref 98–111)
Creatinine, Ser: 1.24 mg/dL — ABNORMAL HIGH (ref 0.44–1.00)
GFR, Estimated: 50 mL/min — ABNORMAL LOW (ref >60)
Glucose, Bld: 136 mg/dL — ABNORMAL HIGH (ref 70–99)
Potassium: 4.4 mmol/L (ref 3.5–5.1)
Sodium: 139 mmol/L (ref 135–145)
Total Bilirubin: 0.6 mg/dL (ref 0.3–1.2)
Total Protein: 6.4 g/dL — ABNORMAL LOW (ref 6.5–8.1)

## 2022-01-19 MED ORDER — HYDROMORPHONE HCL 1 MG/ML IJ SOLN
1.0000 mg | Freq: Once | INTRAMUSCULAR | Status: AC
  Administered 2022-01-19: 1 mg via INTRAVENOUS
  Filled 2022-01-19: qty 1

## 2022-01-19 MED ORDER — OXYCODONE HCL 5 MG PO TABS: 5.0000 mg | ORAL_TABLET | ORAL | 0 refills | Status: DC | PRN

## 2022-01-19 MED ORDER — IOHEXOL 350 MG/ML SOLN
75.0000 mL | Freq: Once | INTRAVENOUS | Status: AC | PRN
  Administered 2022-01-19: 75 mL via INTRAVENOUS

## 2022-01-19 MED ORDER — SODIUM CHLORIDE 0.9 % IV BOLUS
1000.0000 mL | Freq: Once | INTRAVENOUS | Status: AC
  Administered 2022-01-19: 1000 mL via INTRAVENOUS

## 2022-01-19 NOTE — ED Notes (Signed)
Patient transported to CT 

## 2022-01-19 NOTE — ED Provider Notes (Signed)
[facility] EMERGENCY DEPARTMENT Provider Note   CSN: [id number] Arrival date & time: 01/18/22  1945     History  Chief Complaint  Patient presents with   Marletta Lor    Kayla Berger is a 61 y.o. female.  HPI 61 year old female presents with right-sided chest, abdominal and back pain.  She fell yesterday, 12/22 when she tripped on a curb.  There was uneven pavement and she tripped and fell and landed on her right side.  Did not hit her head or lose consciousness.  Her neck is a little sore/stiff but she thinks that developed when she was waiting in the waiting room for 20+ hours.  Otherwise her primary complaint of pain is right-sided chest and abdominal pain as well as right-sided back pain.  Her right hip hurts a little bit as well.  No new numbness or tingling.  She has chronic back problems but does not feel like her chronic back pain is worse than typical.  Home Medications Prior to Admission medications   Medication Sig Start Date End Date Taking? Authorizing Provider  oxyCODONE (ROXICODONE) 5 MG immediate release tablet Take 1 tablet (5 mg total) by mouth every 4 (four) hours as needed for severe pain. 01/19/22  Yes Pricilla Loveless, MD  Alpha Lipoic Acid 200 MG CAPS Take 200 mg by mouth at bedtime.    [provider]  Ascorbic Acid (VITAMIN C) 1000 MG tablet Take 1,000 mg by mouth daily.    [provider]  aspirin 81 MG chewable tablet Chew 81 mg by mouth in the morning.    [provider]  atorvastatin (LIPITOR) 10 MG tablet Take 10 mg by mouth at bedtime.    [provider]  Biotin 06269 MCG TABS Take 10,000 mcg by mouth every evening.    [provider]  cetirizine (ZYRTEC) 10 MG tablet Take 10 mg by mouth in the morning.    [provider]  Cholecalciferol (VITAMIN D3) 50 MCG (2000 UT) TABS Take 2,000 Units by mouth 2 (two) times daily.    [provider]  CRANBERRY PO Take 15,000 mg by mouth in  the morning.    [provider]  DULoxetine (CYMBALTA) 60 MG capsule Take 60 mg by mouth 2 (two) times daily.    [provider]  EXCEDRIN MIGRAINE [phone] MG tablet Take 1 tablet by mouth every 6 (six) hours as needed for headache or migraine.    [provider]  ferrous sulfate 325 (65 FE) MG EC tablet 1 tablet    [provider]  gabapentin (NEURONTIN) 800 MG tablet Take 400 mg by mouth at bedtime. 08/15/21   [provider]  HUMALOG KWIKPEN 100 UNIT/ML KwikPen Inject 4-12 Units into the skin 3 (three) times daily as needed (for a BGL greater than 100).    [provider]  insulin glargine, 2 Unit Dial, (TOUJEO MAX SOLOSTAR) 300 UNIT/ML Solostar Pen Inject 10 Units into the skin at bedtime. Patient taking differently: Inject 38 Units into the skin at bedtime. 08/08/21   Sheikh, Omair Latif, DO  JARDIANCE 10 MG TABS tablet Take 10 mg by mouth daily. 10/03/21   [provider]  lisinopril-hydrochlorothiazide (ZESTORETIC) 10-12.5 MG tablet Take 1 tablet by mouth daily. Patient not taking: Reported on 10/04/2021 08/15/21   [provider]  metoprolol tartrate (LOPRESSOR) 25 MG tablet Take 0.5 tablets (12.5 mg total) by mouth 2 (two) times daily. 08/23/21   Yates Decamp, MD  Multiple  Vitamins-Minerals (CENTRUM WOMEN) TABS Take 1 tablet by mouth daily with breakfast.    [provider]  ondansetron (ZOFRAN) 4 MG tablet Take 1 tablet (4 mg total) by mouth every 6 (six) hours as needed for nausea or vomiting. 02/16/21   [name], MD  pantoprazole (PROTONIX) 40 MG tablet Take 1 tablet (40 mg total) by mouth 2 (two) times daily. Patient taking differently: Take 40 mg by mouth daily. 08/08/21   Marguerita Merles Latif, DO  Teriparatide, Recombinant, 620 MCG/2.48ML SOPN Inject 20 mcg into the skin at bedtime. 09/21/20   [provider]  zinc gluconate 50 MG tablet Take 50 mg by mouth in the morning.    [provider]       Allergies    Hydrocodone    Review of Systems   Review of Systems  Respiratory:  Negative for shortness of breath (but it hurts to breathe).   Cardiovascular:  Positive for chest pain.  Gastrointestinal:  Positive for abdominal pain.  Musculoskeletal:  Positive for back pain and neck pain.  Neurological:  Negative for weakness and numbness.    Physical Exam Updated Vital Signs BP 129/77   Pulse 100   Temp 98 F (36.7 C)   Resp (!) 25   Ht 5\' 3"  (1.6 m)   Wt 90.3 kg   LMP 10/06/2012   SpO2 96%   BMI 35.25 kg/m  Physical Exam Vitals and nursing note reviewed.  Constitutional:      Appearance: She is well-developed.  HENT:     Head: Normocephalic and atraumatic.  Cardiovascular:     Rate and Rhythm: Normal rate and regular rhythm.     Heart sounds: Normal heart sounds.  Pulmonary:     Effort: Pulmonary effort is normal.     Breath sounds: Normal breath sounds.  Abdominal:     Palpations: Abdomen is soft.     Tenderness: There is abdominal tenderness in the right upper quadrant and right lower quadrant.     Comments: Diffuse right sided abdominal tenderness. Unclear if this is coming from the ribs/muscles vs abdomen  Musculoskeletal:     Cervical back: Normal range of motion. No rigidity. Muscular tenderness (paraspinal, bilateral) present. No spinous process tenderness. Normal range of motion.       Back:     Right hip: Tenderness present. No deformity. Normal range of motion.  Skin:    General: Skin is warm and dry.  Neurological:     Mental Status: She is alert.     ED Results / Procedures / Treatments   Labs (all labs ordered are listed, but only abnormal results are displayed) Labs Reviewed  COMPREHENSIVE METABOLIC PANEL - Abnormal; Notable for the following components:      Result Value   Glucose, Bld 136 (*)    Creatinine, Ser 1.24 (*)    Total Protein 6.4 (*)    GFR, Estimated 50 (*)    All other components within normal limits  I-STAT CHEM  8, ED - Abnormal; Notable for the following components:   BUN 31 (*)    Creatinine, Ser 1.20 (*)    Glucose, Bld 138 (*)    Calcium, Ion 1.12 (*)    All other components within normal limits  CBC WITH DIFFERENTIAL/PLATELET  I-STAT CREATININE, ED    EKG None  Radiology CT CHEST ABDOMEN PELVIS W CONTRAST  Result Date: 01/19/2022 CLINICAL DATA:  Trauma. EXAM: CT CHEST, ABDOMEN, AND PELVIS WITH CONTRAST TECHNIQUE: Multidetector CT  imaging of the chest, abdomen and pelvis was performed following the standard protocol during bolus administration of intravenous contrast. RADIATION DOSE REDUCTION: This exam was performed according to the departmental dose-optimization program which includes automated exposure control, adjustment of the mA and/or kV according to patient size and/or use of iterative reconstruction technique. CONTRAST:  75mL OMNIPAQUE IOHEXOL 350 MG/ML SOLN COMPARISON:  CT abdomen pelvis dated 10/04/2011 and right hip and rib radiograph dated 01/18/2022. FINDINGS: CT CHEST FINDINGS Cardiovascular: There is no cardiomegaly or pericardial effusion. Advanced coronary vascular calcification of the LAD. Mild atherosclerotic calcification of the thoracic aorta. No aneurysmal dilatation or dissection. The central pulmonary arteries appear unremarkable. Mediastinum/Nodes: No hilar or mediastinal adenopathy. The esophagus is grossly unremarkable. No mediastinal fluid collection. Lungs/Pleura: There are bibasilar subpleural dependent atelectasis. There is a 3 mm nodule in the right lower lobe (68/4). No focal consolidation, pleural effusion, or pneumothorax. The central airways are patent. Musculoskeletal: Age indeterminate mild compression fracture of superior endplate of T11. Correlation with clinical exam and point tenderness recommended. No other acute osseous pathology. No displaced rib fractures. CT ABDOMEN PELVIS FINDINGS No intra-abdominal free air or free fluid. Hepatobiliary: The liver is  unremarkable. No biliary dilatation. Cholecystectomy. No retained calcified stone noted in the central CBD. Pancreas: Unremarkable. No pancreatic ductal dilatation or surrounding inflammatory changes. Spleen: Normal in size without focal abnormality. Adrenals/Urinary Tract: The adrenal glands unremarkable. There is no hydronephrosis on either side. There is symmetric enhancement and excretion of contrast by both kidneys. The visualized ureters and urinary bladder appear unremarkable. Stomach/Bowel: Small hiatal hernia. Sigmoid diverticulosis without active inflammatory changes. There is moderate amount of stool throughout the colon. No bowel obstruction or active inflammation. Appendectomy. Vascular/Lymphatic: Mild aortoiliac atherosclerotic disease. The IVC is unremarkable. No portal venous gas. There is no adenopathy. Reproductive: The uterus is grossly unremarkable. No adnexal masses. Other: None Musculoskeletal: Old L1-L4 compression fractures with vertebroplasty changes at L1-L3. No acute osseous pathology. IMPRESSION: 1. Age indeterminate mild compression fracture of superior endplate of T11. Correlation with clinical exam and point tenderness recommended. 2. No acute/traumatic intrathoracic, abdominal, or pelvic pathology. 3. Sigmoid diverticulosis. No bowel obstruction. 4. A 3 mm right lower lobe pulmonary nodule. No follow-up needed if patient is low-risk.This recommendation follows the consensus statement: Guidelines for Management of Incidental Pulmonary Nodules Detected on CT Images: From the Fleischner Society 2017; Radiology 2017; 284:228-243. 5. Old L1-L4 compression fractures with vertebroplasty changes at L1-L3. 6.  Aortic Atherosclerosis (ICD10-I70.0). Electronically Signed   By: Elgie CollardArash  Radparvar M.D.   On: 01/19/2022 20:46   DG Hip Unilat W or Wo Pelvis 2-3 Views Right  Result Date: 01/19/2022 CLINICAL DATA:  Fall with rib pain and right hip pain. EXAM: DG HIP (WITH OR WITHOUT PELVIS) 2-3V  RIGHT COMPARISON:  None Available. FINDINGS: There is no evidence of hip fracture or dislocation. Compression fractures with augmentation are noted in the lower lumbar spine. Radiopaque densities are noted over the left hip which may be external to the patient. Vascular calcifications are noted in the lower extremities bilaterally. IMPRESSION: No acute fracture or dislocation. Electronically Signed   By: Thornell SartoriusLaura  Taylor M.D.   On: 01/19/2022 00:12   DG Ribs Unilateral W/Chest Right  Result Date: 01/19/2022 CLINICAL DATA:  Fall and rib pain. EXAM: RIGHT RIBS AND CHEST - 3+ VIEW COMPARISON:  None Available. FINDINGS: Probable age indeterminate fracture of the posterior right twelfth rib, possibly old. Correlation with point tenderness recommended. No other acute fracture. The lungs are clear. There  is no pleural effusion or pneumothorax. The cardiac silhouette is within limits. IMPRESSION: Probable age indeterminate fracture of the posterior right twelfth rib. Correlation with point tenderness recommended. Electronically Signed   By: Elgie Collard M.D.   On: 01/19/2022 00:09    Procedures Procedures    Medications Ordered in ED Medications  HYDROmorphone (DILAUDID) injection 1 mg (1 mg Intravenous Given 01/19/22 1720)  sodium chloride 0.9 % bolus 1,000 mL (0 mLs Intravenous Stopped 01/19/22 1935)  iohexol (OMNIPAQUE) 350 MG/ML injection 75 mL (75 mLs Intravenous Contrast Given 01/19/22 2001)    ED Course/ Medical Decision Making/ A&P                           Medical Decision Making Amount and/or Complexity of Data Reviewed Labs: ordered.    Details: No LFT abnormalities.  No significant kidney dysfunction. Radiology: ordered and independent interpretation performed.    Details: No pneumothorax on x-ray or CT.  Risk Prescription drug management.   Patient presents with right-sided chest/back pain but also significant amount of right upper quadrant right lower quadrant abdominal pain.   Suspect this is probably muscular related from her recent fall/chest injury but given the degree of tenderness I think CT was warranted.  Fortunately no intra-abdominal/retroperitoneal injury or pneumothorax.  Initially there was concern for rib fracture on the x-ray but this is not apparent on the chest CT.  However has an age-indeterminate T11 fracture.  Either way with her degree of pain I think it is reasonable to treat with some oral pain control with oxycodone.  No evidence of severe trauma that would need admission or intervention.  For now we will discharge home with pain control and have her follow-up with her spine specialist.  Will give an incentive spirometer and have her follow-up.  Given return precautions.        Final Clinical Impression(s) / ED Diagnoses Final diagnoses:  Closed wedge compression fracture of T11 vertebra, initial encounter (HCC)  Contusion of rib on right side, initial encounter    Rx / DC Orders ED Discharge Orders          Ordered    oxyCODONE (ROXICODONE) 5 MG immediate release tablet  Every 4 hours PRN        01/19/22 2136              Pricilla Loveless, MD 01/19/22 2247

## 2022-01-19 NOTE — ED Notes (Signed)
Purewick placed on pt. 

## 2022-01-19 NOTE — ED Notes (Signed)
Placed pt on 2L 02 per [place]. 

## 2022-01-19 NOTE — ED Notes (Addendum)
Error

## 2022-01-19 NOTE — Discharge Instructions (Addendum)
Your CT scan shows a small break in T11 in your spine.  Is unclear if this occurred from the fall or if this is an older fracture.  For pain we are sending you with oxycodone.  Be sure to also take ibuprofen and Tylenol.  Do not drive or operate heavy machinery on this medicine.  Follow-up with your neurosurgeon.  If you develop worsening, recurrent, or continued back pain, numbness or weakness in the legs, incontinence of your bowels or bladder, numbness of your buttocks, fever, abdominal pain, or any other new/concerning symptoms then return to the ER for evaluation.

## 2022-02-14 ENCOUNTER — Ambulatory Visit: Payer: 59 | Admitting: Cardiology

## 2022-03-02 ENCOUNTER — Other Ambulatory Visit: Payer: Self-pay | Admitting: Cardiology

## 2022-03-02 DIAGNOSIS — R000 Tachycardia, unspecified: Secondary | ICD-10-CM

## 2022-03-02 DIAGNOSIS — I10 Essential (primary) hypertension: Secondary | ICD-10-CM

## 2022-03-06 ENCOUNTER — Ambulatory Visit: Payer: 59 | Admitting: Cardiology

## 2022-03-18 ENCOUNTER — Encounter: Payer: Self-pay | Admitting: Cardiology

## 2022-03-18 ENCOUNTER — Ambulatory Visit: Payer: 59 | Admitting: Cardiology

## 2022-03-18 VITALS — BP 136/80 | HR 120 | Resp 16 | Ht 63.0 in | Wt 206.0 lb

## 2022-03-18 DIAGNOSIS — I25118 Atherosclerotic heart disease of native coronary artery with other forms of angina pectoris: Secondary | ICD-10-CM

## 2022-03-18 DIAGNOSIS — R6889 Other general symptoms and signs: Secondary | ICD-10-CM

## 2022-03-18 DIAGNOSIS — R000 Tachycardia, unspecified: Secondary | ICD-10-CM

## 2022-03-18 DIAGNOSIS — R9439 Abnormal result of other cardiovascular function study: Secondary | ICD-10-CM

## 2022-03-18 DIAGNOSIS — I10 Essential (primary) hypertension: Secondary | ICD-10-CM

## 2022-03-18 MED ORDER — METOPROLOL TARTRATE 25 MG PO TABS: 25.0000 mg | ORAL_TABLET | Freq: Two times a day (BID) | ORAL | 2 refills | Status: DC

## 2022-03-18 MED ORDER — DILTIAZEM HCL ER COATED BEADS 180 MG PO CP24: 180.0000 mg | ORAL_CAPSULE | Freq: Every day | ORAL | 2 refills | Status: DC

## 2022-03-18 NOTE — Progress Notes (Signed)
Primary Physician/Referring:  Janie Morning, DO  Patient ID: Kayla Berger, female    DOB: 03/07/1960, 62 y.o.   MRN: [id number]  Chief Complaint  Patient presents with  . Abnormal stress test  . Tachycardia  . Follow-up    3 months   HPI:    Kayla Berger  is a 62 y.o. Patient with hypertension, hyperlipidemia, diabetes mellitus, decreased exercise tolerance, sinus tachycardia seen in the emergency room on 07/2021, for acute renal failure was precipitated by dehydration and hypovolemic shock after she started with Ozempic leading to hospitalization she now presents for 62-monthoffice visit..  Patient again admitted to the hospital on 01/19/2022 after an accidental fall, CT of the chest and abdomen revealing severe calcification especially in the LAD territory.  Patient has been concerned about this, her sister is a nMarine scientistand she is also being very concerned about this.  Patient continues to endorse mild generalized weakness and dyspnea on exertion and fatigue even doing minimal activity.  States that since the fall, she has even further reduced her physical activity and has started to gain weight again.  Past Medical History:  Diagnosis Date  . Arthritis   . Complication of anesthesia    "hard to awaken"  . Dysrhythmia    irregular heartbeat- rx  . GERD (gastroesophageal reflux disease)   . History of kidney stones   . Hypertension   . Hypothyroidism    synthroid in past no med now  . 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 Migraines    occ  . PCOS (polycystic ovarian syndrome) 1986  . PONV (postoperative nausea and vomiting)   . Renal disorder   . Shoulder pain   . Thyroid disease   . Trochanteric bursitis of left hip   . Vaginal cysts    4 have been removed   Past Surgical History:  Procedure Laterality Date  . APPENDECTOMY  1967  . CARDIAC CATHETERIZATION Bilateral    cataracts  . CHOLECYSTECTOMY N/A 02/16/2021   Procedure: LAPAROSCOPIC  CHOLECYSTECTOMY;  Surgeon: ADwan Bolt MD;  [facility] [facility]  Service: General;  Laterality: N/A;  . KYPHOPLASTY N/A 10/31/2016   Procedure: KYPHOPLASTY LUMBAR ONE, LUMBAR TWO AND  LUMBAR THREE;  Surgeon: BMelina Schools MD;  [facility] [facility]  Service: Orthopedics;  Laterality: N/A;  . nsvd  2001   normal vaginal del  . sonobysterogram, laporoscopy  1997   fertility test   Family History  Problem Relation Age of Onset  . Hypertension Mother   . Diabetes Mother   . Stroke Mother 845      first one, (pt had 3 total)  . Hypertension Father   . Cancer Father   . Hypertension Sister   . Heart disease Sister   . Hypertension Maternal Grandmother   . Heart attack Maternal Grandfather   . Diabetes Maternal Grandfather   . Diabetes Paternal Grandfather     Social History   Tobacco Use  . Smoking status: Never  . Smokeless tobacco: Never  Substance Use Topics  . Alcohol use: No   Marital Status: Divorced  ROS  Review of Systems  Constitutional: Positive for malaise/fatigue and weight gain.  Cardiovascular:  Negative for chest pain, dyspnea on exertion and leg swelling.   Objective  Blood pressure 136/80, pulse (!) 120, resp. rate 16, height 5' 3"$  (1.6 m), weight 206 lb (93.4 kg), last menstrual period 10/06/2012, SpO2 96 %. Body mass  index is 36.49 kg/m.     03/18/2022    3:57 PM 01/19/2022    9:30 PM 01/19/2022    9:00 PM  Vitals with BMI  Height 5' 3"$     Weight 206 lbs    BMI [id number]    Systolic [id number]  Diastolic 80 77 68  Pulse 123456 100 98    Physical Exam Constitutional:      Appearance: She is obese.  Neck:     Vascular: No JVD.  Cardiovascular:     Rate and Rhythm: Regular rhythm. Tachycardia present.     Pulses: Intact distal pulses.     Heart sounds: Normal heart sounds. No murmur heard.    No gallop.  Pulmonary:     Effort: Pulmonary effort is normal.     Breath sounds: Normal breath sounds.  Abdominal:     General: Bowel sounds are normal.      Palpations: Abdomen is soft.  Musculoskeletal:     Right lower leg: No edema.     Left lower leg: No edema.    Medications and allergies   Allergies  Allergen Reactions  . Hydrocodone Itching, Swelling and Other (See Comments)    Numbness of the face/hands and tingling also     Medication list after today's encounter   Current Outpatient Medications:  .  Alpha Lipoic Acid 200 MG CAPS, Take 200 mg by mouth at bedtime., Disp: , Rfl:  .  Ascorbic Acid (VITAMIN C) 1000 MG tablet, Take 1,000 mg by mouth daily., Disp: , Rfl:  .  aspirin 81 MG chewable tablet, Chew 81 mg by mouth in the morning., Disp: , Rfl:  .  atorvastatin (LIPITOR) 10 MG tablet, Take 10 mg by mouth at bedtime., Disp: , Rfl:  .  Biotin 10000 MCG TABS, Take 10,000 mcg by mouth every evening., Disp: , Rfl:  .  cetirizine (ZYRTEC) 10 MG tablet, Take 10 mg by mouth in the morning., Disp: , Rfl:  .  Cholecalciferol (VITAMIN D3) 50 MCG (2000 UT) TABS, Take 2,000 Units by mouth 2 (two) times daily., Disp: , Rfl:  .  CRANBERRY PO, Take 15,000 mg by mouth in the morning., Disp: , Rfl:  .  diltiazem (CARDIZEM CD) 180 MG 24 hr capsule, Take 1 capsule (180 mg total) by mouth daily., Disp: 30 capsule, Rfl: 2 .  DULoxetine (CYMBALTA) 60 MG capsule, Take 60 mg by mouth 2 (two) times daily., Disp: , Rfl:  .  EXCEDRIN MIGRAINE 250-250-65 MG tablet, Take 1 tablet by mouth every 6 (six) hours as needed for headache or migraine., Disp: , Rfl:  .  gabapentin (NEURONTIN) 800 MG tablet, Take 400 mg by mouth at bedtime., Disp: , Rfl:  .  HUMALOG KWIKPEN 100 UNIT/ML KwikPen, Inject 4-12 Units into the skin 3 (three) times daily as needed (for a BGL greater than 100)., Disp: , Rfl:  .  insulin glargine, 2 Unit Dial, (TOUJEO MAX SOLOSTAR) 300 UNIT/ML Solostar Pen, Inject 10 Units into the skin at bedtime. (Patient taking differently: Inject 48 Units into the skin at bedtime.), Disp: , Rfl:  .  JARDIANCE 10 MG TABS tablet, Take 10 mg by mouth daily.,  Disp: , Rfl:  .  Multiple Vitamins-Minerals (CENTRUM WOMEN) TABS, Take 1 tablet by mouth daily with breakfast., Disp: , Rfl:  .  pantoprazole (PROTONIX) 40 MG tablet, Take 1 tablet (40 mg total) by mouth 2 (two) times daily. (Patient taking differently: Take 40 mg by mouth daily.), Disp:  60 tablet, Rfl: 0 .  Teriparatide, Recombinant, 620 MCG/2.48ML SOPN, Inject 20 mcg into the skin at bedtime., Disp: , Rfl:  .  zinc gluconate 50 MG tablet, Take 50 mg by mouth in the morning., Disp: , Rfl:  .  metoprolol tartrate (LOPRESSOR) 25 MG tablet, Take 1 tablet (25 mg total) by mouth 2 (two) times daily., Disp: 60 tablet, Rfl: 2 .  ondansetron (ZOFRAN) 4 MG tablet, Take 1 tablet (4 mg total) by mouth every 6 (six) hours as needed for nausea or vomiting., Disp: 20 tablet, Rfl: 0  Laboratory examination:   Lab Results  Component Value Date   NA 141 01/19/2022   K 4.5 01/19/2022   CO2 28 01/19/2022   GLUCOSE 138 (H) 01/19/2022   BUN 31 (H) 01/19/2022   CREATININE 1.20 (H) 01/19/2022   CALCIUM 9.0 01/19/2022   GFRNONAA 50 (L) 01/19/2022       Latest Ref Rng & Units 01/19/2022    5:28 PM 01/19/2022    5:15 PM 08/17/2021    2:01 PM  CMP  Glucose 70 - 99 mg/dL 138  136  151   BUN 8 - 23 mg/dL 31  23  46   Creatinine 0.44 - 1.00 mg/dL 1.20  1.24  1.46   Sodium 135 - 145 mmol/L 141  139  143   Potassium 3.5 - 5.1 mmol/L 4.5  4.4  4.1   Chloride 98 - 111 mmol/L 103  101  108   CO2 22 - 32 mmol/L  28  25   Calcium 8.9 - 10.3 mg/dL  9.0  10.1   Total Protein 6.5 - 8.1 g/dL  6.4    Total Bilirubin 0.3 - 1.2 mg/dL  0.6    Alkaline Phos 38 - 126 U/L  90    AST 15 - 41 U/L  29    ALT 0 - 44 U/L  25        Latest Ref Rng & Units 01/19/2022    5:28 PM 01/19/2022    5:15 PM 08/17/2021    2:01 PM  CBC  WBC 4.0 - 10.5 K/uL  6.9  11.3   Hemoglobin 12.0 - 15.0 g/dL 12.9  12.3  11.6   Hematocrit 36.0 - 46.0 % 38.0  38.1  35.4   Platelets 150 - 400 K/uL  220  365    HEMOGLOBIN A1C Lab Results   Component Value Date   HGBA1C 6.6 (H) 08/04/2021   MPG 142.72 08/04/2021   TSH Recent Labs    08/17/21 1500  TSH 3.256   External labs:  Labs 08/23/2021:  Hb 9.3/HCT 28.7, platelets 307.  Normal indicis.  BUN 45, creatinine 1.51, EGFR 37 mL, potassium 4.7, LFTs normal.  A1c 7.6%.  TSH normal at 3.97.  Vitamin D 55.7.  Labs 04/12/2021:  Total cholesterol 121, triglycerides 145, HDL 40, LDL 52.  Radiology:   CT chest and abdomen 01/19/2022: Cardiovascular: There is no cardiomegaly or pericardial effusion. Advanced coronary vascular calcification of the LAD.  Mild atherosclerotic calcification of the thoracic aorta. No aneurysmal dilatation or dissection.  The central pulmonary arteries appear unremarkable.  Cardiac Studies:   PCV MYOCARDIAL PERFUSION WO LEXISCAN 09/12/2021  Narrative Exercise nuclear stress test 09/12/21 Myocardial perfusion is abnormal. Overall LV systolic function is normal without regional wall motion abnormalities. Intermediate risk study. There is a reversible mild defect in the inferior and apical regions. Stress LV EF: 51%. Abnormal ECG stress. The patient exercised for 6 minutes and  41 seconds of a Modified Bruce protocol, achieving approximately 8.09 METs and 89% MPHR. The heart rate response was normal. The blood pressure response was normal. No previous exam available for comparison.   PCV ECHOCARDIOGRAM COMPLETE 10/05/2021  Narrative Echocardiogram 10/05/2021: Left ventricle cavity is normal in size and wall thickness. Normal global wall motion. Normal LV systolic function with EF 61%. Normal diastolic filling pattern. No significant valvular abnormality. Normal right atrial pressure.    EKG:   EKG 08/23/2021: Normal sinus rhythm at the rate of 78 bpm, poor R wave progression, probably normal variant.  No evidence of ischemia otherwise normal EKG.    EKG 08/17/2021: Sinus tachycardia at rate of 143 bpm.  Otherwise normal EKG.  Assessment      ICD-10-CM   1. Coronary artery disease involving native coronary artery of native heart with other form of angina pectoris (HCC)  I25.118 diltiazem (CARDIZEM CD) 180 MG 24 hr capsule    metoprolol tartrate (LOPRESSOR) 25 MG tablet    Basic metabolic panel    CBC    2. Sinus tachycardia  R00.0 diltiazem (CARDIZEM CD) 180 MG 24 hr capsule    metoprolol tartrate (LOPRESSOR) 25 MG tablet    3. Abnormal nuclear stress test  R94.39     4. Decreased exercise tolerance  R68.89     5. Primary hypertension  I10        Orders Placed This Encounter  Procedures  . Basic metabolic panel  . CBC    Meds ordered this encounter  Medications  . diltiazem (CARDIZEM CD) 180 MG 24 hr capsule    Sig: Take 1 capsule (180 mg total) by mouth daily.    Dispense:  30 capsule    Refill:  2  . metoprolol tartrate (LOPRESSOR) 25 MG tablet    Sig: Take 1 tablet (25 mg total) by mouth 2 (two) times daily.    Dispense:  60 tablet    Refill:  2    Medications Discontinued During This Encounter  Medication Reason  . ferrous sulfate 325 (65 FE) MG EC tablet   . lisinopril-hydrochlorothiazide (ZESTORETIC) 10-12.5 MG tablet   . oxyCODONE (ROXICODONE) 5 MG immediate release tablet   . metoprolol tartrate (LOPRESSOR) 25 MG tablet Reorder      Recommendations:   Kayla Berger is a 62 y.o.  Patient with hypertension, hyperlipidemia, diabetes mellitus, decreased exercise tolerance, sinus tachycardia seen in the emergency room on 07/2021, for acute renal failure was precipitated by dehydration and hypovolemic shock after she started with Ozempic leading to hospitalization she now presents for 41-monthoffice visit..  1. Coronary artery disease involving native coronary artery of native heart with other form of angina pectoris ([facility] Patient again admitted to the hospital on 01/19/2022 after an accidental fall, CT of the chest and abdomen revealing severe calcification especially in the LAD territory.   Patient's symptoms of fatigue, dyspnea, resting sinus tachycardia are probably anginal equivalent.  In view of abnormal nuclear stress test, underlying medical comorbidity, I have recommended that we proceed with cardiac catheterization to exclude multivessel disease and proximal LAD disease.  Her sister who is a nMarine scientistis also very concerned about CAD as well.  Schedule for cardiac catheterization, and possible angioplasty. We discussed regarding risks, benefits, alternatives to this including stress testing, CTA and continued medical therapy. Patient wants to proceed. Understands <1-2% risk of death, stroke, MI, urgent CABG, bleeding, infection, renal failure but not limited to these.   2. Sinus tachycardia As  dictated above, resting sinus tachycardia may be a sign of significant coronary disease as well and cannot be explained by just deconditioning.  I have increased the dose of the metoprolol to tartrate from 12.5 mg twice daily to 25 mg twice daily and also started her on diltiazem CD1 80 mg daily.  3. Abnormal nuclear stress test I reviewed the results of the stress test again with the patient.  Low normal LVEF with inferior ischemia, intermediate restudy but clinically appears to be high risk in view of above cardiovascular risks and especially diabetes mellitus.  4. Decreased exercise tolerance This is a generally: As dictated above.  5. Primary hypertension Blood pressure is well-controlled, will continue to monitor this starting diltiazem CD as well.  Patient's weight has increased again.  Patient has done well with weight loss with Ozempic, she had discontinued this but was willing to restart this on a prior office visit.  She is now on Jardiance, as she is now educated about keeping herself well-hydrated, Ozempic still would be an excellent choice to restart.  Will wait upon her cardiac catheterization prior to making decisions regarding this.  She would also need sleep study in view of  continued fatigue, moderate obesity.   Adrian Prows, MD, [facility] 03/18/2022, 4:36 PM Office: [phone]

## 2022-03-18 NOTE — H&P (View-Only) (Signed)
 Primary Physician/Referring:  Collins, Dana, DO  Patient ID: Kayla Berger, female    DOB: 04/24/1960, 61 y.o.   MRN: [id number]  Chief Complaint  Patient presents with  . Abnormal stress test  . Tachycardia  . Follow-up    3 months   HPI:    Kayla Berger  is a 61 y.o. Patient with hypertension, hyperlipidemia, diabetes mellitus, decreased exercise tolerance, sinus tachycardia seen in the emergency room on 07/2021, for acute renal failure was precipitated by dehydration and hypovolemic shock after she started with Ozempic leading to hospitalization she now presents for 3-month office visit..  Patient again admitted to the hospital on 01/19/2022 after an accidental fall, CT of the chest and abdomen revealing severe calcification especially in the LAD territory.  Patient has been concerned about this, her sister is a nurse and she is also being very concerned about this.  Patient continues to endorse mild generalized weakness and dyspnea on exertion and fatigue even doing minimal activity.  States that since the fall, she has even further reduced her physical activity and has started to gain weight again.  Past Medical History:  Diagnosis Date  . Arthritis   . Complication of anesthesia    "hard to awaken"  . Dysrhythmia    irregular heartbeat- rx  . GERD (gastroesophageal reflux disease)   . History of kidney stones   . Hypertension   . Hypothyroidism    synthroid in past no med now  . 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 Migraines    occ  . PCOS (polycystic ovarian syndrome) 1986  . PONV (postoperative nausea and vomiting)   . Renal disorder   . Shoulder pain   . Thyroid disease   . Trochanteric bursitis of left hip   . Vaginal cysts    4 have been removed   Past Surgical History:  Procedure Laterality Date  . APPENDECTOMY  1967  . CARDIAC CATHETERIZATION Bilateral    cataracts  . CHOLECYSTECTOMY N/A 02/16/2021   Procedure: LAPAROSCOPIC  CHOLECYSTECTOMY;  Surgeon: Allen, Shelby L, MD;  Location: [facility] OR;  Service: General;  Laterality: N/A;  . KYPHOPLASTY N/A 10/31/2016   Procedure: KYPHOPLASTY LUMBAR ONE, LUMBAR TWO AND  LUMBAR THREE;  Surgeon: Brooks, Dahari, MD;  Location: [facility] OR;  Service: Orthopedics;  Laterality: N/A;  . nsvd  2001   normal vaginal del  . sonobysterogram, laporoscopy  1997   fertility test   Family History  Problem Relation Age of Onset  . Hypertension Mother   . Diabetes Mother   . Stroke Mother 84       first one, (pt had 3 total)  . Hypertension Father   . Cancer Father   . Hypertension Sister   . Heart disease Sister   . Hypertension Maternal Grandmother   . Heart attack Maternal Grandfather   . Diabetes Maternal Grandfather   . Diabetes Paternal Grandfather     Social History   Tobacco Use  . Smoking status: Never  . Smokeless tobacco: Never  Substance Use Topics  . Alcohol use: No   Marital Status: Divorced  ROS  Review of Systems  Constitutional: Positive for malaise/fatigue and weight gain.  Cardiovascular:  Negative for chest pain, dyspnea on exertion and leg swelling.   Objective  Blood pressure 136/80, pulse (!) 120, resp. rate 16, height 5' 3" (1.6 m), weight 206 lb (93.4 kg), last menstrual period 10/06/2012, SpO2 96 %. Body mass   index is 36.49 kg/m.     03/18/2022    3:57 PM 01/19/2022    9:30 PM 01/19/2022    9:00 PM  Vitals with BMI  Height 5' 3"    Weight 206 lbs    BMI 36.5    Systolic 136 129 118  Diastolic 80 77 68  Pulse 120 100 98    Physical Exam Constitutional:      Appearance: She is obese.  Neck:     Vascular: No JVD.  Cardiovascular:     Rate and Rhythm: Regular rhythm. Tachycardia present.     Pulses: Intact distal pulses.     Heart sounds: Normal heart sounds. No murmur heard.    No gallop.  Pulmonary:     Effort: Pulmonary effort is normal.     Breath sounds: Normal breath sounds.  Abdominal:     General: Bowel sounds are normal.      Palpations: Abdomen is soft.  Musculoskeletal:     Right lower leg: No edema.     Left lower leg: No edema.    Medications and allergies   Allergies  Allergen Reactions  . Hydrocodone Itching, Swelling and Other (See Comments)    Numbness of the face/hands and tingling also     Medication list after today's encounter   Current Outpatient Medications:  .  Alpha Lipoic Acid 200 MG CAPS, Take 200 mg by mouth at bedtime., Disp: , Rfl:  .  Ascorbic Acid (VITAMIN C) 1000 MG tablet, Take 1,000 mg by mouth daily., Disp: , Rfl:  .  aspirin 81 MG chewable tablet, Chew 81 mg by mouth in the morning., Disp: , Rfl:  .  atorvastatin (LIPITOR) 10 MG tablet, Take 10 mg by mouth at bedtime., Disp: , Rfl:  .  Biotin 10000 MCG TABS, Take 10,000 mcg by mouth every evening., Disp: , Rfl:  .  cetirizine (ZYRTEC) 10 MG tablet, Take 10 mg by mouth in the morning., Disp: , Rfl:  .  Cholecalciferol (VITAMIN D3) 50 MCG (2000 UT) TABS, Take 2,000 Units by mouth 2 (two) times daily., Disp: , Rfl:  .  CRANBERRY PO, Take 15,000 mg by mouth in the morning., Disp: , Rfl:  .  diltiazem (CARDIZEM CD) 180 MG 24 hr capsule, Take 1 capsule (180 mg total) by mouth daily., Disp: 30 capsule, Rfl: 2 .  DULoxetine (CYMBALTA) 60 MG capsule, Take 60 mg by mouth 2 (two) times daily., Disp: , Rfl:  .  EXCEDRIN MIGRAINE 250-250-65 MG tablet, Take 1 tablet by mouth every 6 (six) hours as needed for headache or migraine., Disp: , Rfl:  .  gabapentin (NEURONTIN) 800 MG tablet, Take 400 mg by mouth at bedtime., Disp: , Rfl:  .  HUMALOG KWIKPEN 100 UNIT/ML KwikPen, Inject 4-12 Units into the skin 3 (three) times daily as needed (for a BGL greater than 100)., Disp: , Rfl:  .  insulin glargine, 2 Unit Dial, (TOUJEO MAX SOLOSTAR) 300 UNIT/ML Solostar Pen, Inject 10 Units into the skin at bedtime. (Patient taking differently: Inject 48 Units into the skin at bedtime.), Disp: , Rfl:  .  JARDIANCE 10 MG TABS tablet, Take 10 mg by mouth daily.,  Disp: , Rfl:  .  Multiple Vitamins-Minerals (CENTRUM WOMEN) TABS, Take 1 tablet by mouth daily with breakfast., Disp: , Rfl:  .  pantoprazole (PROTONIX) 40 MG tablet, Take 1 tablet (40 mg total) by mouth 2 (two) times daily. (Patient taking differently: Take 40 mg by mouth daily.), Disp:   60 tablet, Rfl: 0 .  Teriparatide, Recombinant, 620 MCG/2.48ML SOPN, Inject 20 mcg into the skin at bedtime., Disp: , Rfl:  .  zinc gluconate 50 MG tablet, Take 50 mg by mouth in the morning., Disp: , Rfl:  .  metoprolol tartrate (LOPRESSOR) 25 MG tablet, Take 1 tablet (25 mg total) by mouth 2 (two) times daily., Disp: 60 tablet, Rfl: 2 .  ondansetron (ZOFRAN) 4 MG tablet, Take 1 tablet (4 mg total) by mouth every 6 (six) hours as needed for nausea or vomiting., Disp: 20 tablet, Rfl: 0  Laboratory examination:   Lab Results  Component Value Date   NA 141 01/19/2022   K 4.5 01/19/2022   CO2 28 01/19/2022   GLUCOSE 138 (H) 01/19/2022   BUN 31 (H) 01/19/2022   CREATININE 1.20 (H) 01/19/2022   CALCIUM 9.0 01/19/2022   GFRNONAA 50 (L) 01/19/2022       Latest Ref Rng & Units 01/19/2022    5:28 PM 01/19/2022    5:15 PM 08/17/2021    2:01 PM  CMP  Glucose 70 - 99 mg/dL 138  136  151   BUN 8 - 23 mg/dL 31  23  46   Creatinine 0.44 - 1.00 mg/dL 1.20  1.24  1.46   Sodium 135 - 145 mmol/L 141  139  143   Potassium 3.5 - 5.1 mmol/L 4.5  4.4  4.1   Chloride 98 - 111 mmol/L 103  101  108   CO2 22 - 32 mmol/L  28  25   Calcium 8.9 - 10.3 mg/dL  9.0  10.1   Total Protein 6.5 - 8.1 g/dL  6.4    Total Bilirubin 0.3 - 1.2 mg/dL  0.6    Alkaline Phos 38 - 126 U/L  90    AST 15 - 41 U/L  29    ALT 0 - 44 U/L  25        Latest Ref Rng & Units 01/19/2022    5:28 PM 01/19/2022    5:15 PM 08/17/2021    2:01 PM  CBC  WBC 4.0 - 10.5 K/uL  6.9  11.3   Hemoglobin 12.0 - 15.0 g/dL 12.9  12.3  11.6   Hematocrit 36.0 - 46.0 % 38.0  38.1  35.4   Platelets 150 - 400 K/uL  220  365    HEMOGLOBIN A1C Lab Results   Component Value Date   HGBA1C 6.6 (H) 08/04/2021   MPG 142.72 08/04/2021   TSH Recent Labs    08/17/21 1500  TSH 3.256   External labs:  Labs 08/23/2021:  Hb 9.3/HCT 28.7, platelets 307.  Normal indicis.  BUN 45, creatinine 1.51, EGFR 37 mL, potassium 4.7, LFTs normal.  A1c 7.6%.  TSH normal at 3.97.  Vitamin D 55.7.  Labs 04/12/2021:  Total cholesterol 121, triglycerides 145, HDL 40, LDL 52.  Radiology:   CT chest and abdomen 01/19/2022: Cardiovascular: There is no cardiomegaly or pericardial effusion. Advanced coronary vascular calcification of the LAD.  Mild atherosclerotic calcification of the thoracic aorta. No aneurysmal dilatation or dissection.  The central pulmonary arteries appear unremarkable.  Cardiac Studies:   PCV MYOCARDIAL PERFUSION WO LEXISCAN 09/12/2021  Narrative Exercise nuclear stress test 09/12/21 Myocardial perfusion is abnormal. Overall LV systolic function is normal without regional wall motion abnormalities. Intermediate risk study. There is a reversible mild defect in the inferior and apical regions. Stress LV EF: 51%. Abnormal ECG stress. The patient exercised for 6 minutes and   41 seconds of a Modified Bruce protocol, achieving approximately 8.09 METs and 89% MPHR. The heart rate response was normal. The blood pressure response was normal. No previous exam available for comparison.   PCV ECHOCARDIOGRAM COMPLETE 10/05/2021  Narrative Echocardiogram 10/05/2021: Left ventricle cavity is normal in size and wall thickness. Normal global wall motion. Normal LV systolic function with EF 61%. Normal diastolic filling pattern. No significant valvular abnormality. Normal right atrial pressure.    EKG:   EKG 08/23/2021: Normal sinus rhythm at the rate of 78 bpm, poor R wave progression, probably normal variant.  No evidence of ischemia otherwise normal EKG.    EKG 08/17/2021: Sinus tachycardia at rate of 143 bpm.  Otherwise normal EKG.  Assessment      ICD-10-CM   1. Coronary artery disease involving native coronary artery of native heart with other form of angina pectoris (HCC)  I25.118 diltiazem (CARDIZEM CD) 180 MG 24 hr capsule    metoprolol tartrate (LOPRESSOR) 25 MG tablet    Basic metabolic panel    CBC    2. Sinus tachycardia  R00.0 diltiazem (CARDIZEM CD) 180 MG 24 hr capsule    metoprolol tartrate (LOPRESSOR) 25 MG tablet    3. Abnormal nuclear stress test  R94.39     4. Decreased exercise tolerance  R68.89     5. Primary hypertension  I10        Orders Placed This Encounter  Procedures  . Basic metabolic panel  . CBC    Meds ordered this encounter  Medications  . diltiazem (CARDIZEM CD) 180 MG 24 hr capsule    Sig: Take 1 capsule (180 mg total) by mouth daily.    Dispense:  30 capsule    Refill:  2  . metoprolol tartrate (LOPRESSOR) 25 MG tablet    Sig: Take 1 tablet (25 mg total) by mouth 2 (two) times daily.    Dispense:  60 tablet    Refill:  2    Medications Discontinued During This Encounter  Medication Reason  . ferrous sulfate 325 (65 FE) MG EC tablet   . lisinopril-hydrochlorothiazide (ZESTORETIC) 10-12.5 MG tablet   . oxyCODONE (ROXICODONE) 5 MG immediate release tablet   . metoprolol tartrate (LOPRESSOR) 25 MG tablet Reorder      Recommendations:   Kayla Berger is a 61 y.o.  Patient with hypertension, hyperlipidemia, diabetes mellitus, decreased exercise tolerance, sinus tachycardia seen in the emergency room on 07/2021, for acute renal failure was precipitated by dehydration and hypovolemic shock after she started with Ozempic leading to hospitalization she now presents for 3-month office visit..  1. Coronary artery disease involving native coronary artery of native heart with other form of angina pectoris (HCC) Patient again admitted to the hospital on 01/19/2022 after an accidental fall, CT of the chest and abdomen revealing severe calcification especially in the LAD territory.   Patient's symptoms of fatigue, dyspnea, resting sinus tachycardia are probably anginal equivalent.  In view of abnormal nuclear stress test, underlying medical comorbidity, I have recommended that we proceed with cardiac catheterization to exclude multivessel disease and proximal LAD disease.  Her sister who is a nurse is also very concerned about CAD as well.  Schedule for cardiac catheterization, and possible angioplasty. We discussed regarding risks, benefits, alternatives to this including stress testing, CTA and continued medical therapy. Patient wants to proceed. Understands <1-2% risk of death, stroke, MI, urgent CABG, bleeding, infection, renal failure but not limited to these.   2. Sinus tachycardia As   dictated above, resting sinus tachycardia may be a sign of significant coronary disease as well and cannot be explained by just deconditioning.  I have increased the dose of the metoprolol to tartrate from 12.5 mg twice daily to 25 mg twice daily and also started her on diltiazem CD1 80 mg daily.  3. Abnormal nuclear stress test I reviewed the results of the stress test again with the patient.  Low normal LVEF with inferior ischemia, intermediate restudy but clinically appears to be high risk in view of above cardiovascular risks and especially diabetes mellitus.  4. Decreased exercise tolerance This is a generally: As dictated above.  5. Primary hypertension Blood pressure is well-controlled, will continue to monitor this starting diltiazem CD as well.  Patient's weight has increased again.  Patient has done well with weight loss with Ozempic, she had discontinued this but was willing to restart this on a prior office visit.  She is now on Jardiance, as she is now educated about keeping herself well-hydrated, Ozempic still would be an excellent choice to restart.  Will wait upon her cardiac catheterization prior to making decisions regarding this.  She would also need sleep study in view of  continued fatigue, moderate obesity.   Siegfried Vieth, MD, FACC 03/18/2022, 4:36 PM Office: [phone] 

## 2022-03-29 LAB — CBC
Hematocrit: 40.7 % (ref 34.0–46.6)
Hemoglobin: 13.7 g/dL (ref 11.1–15.9)
MCH: 29.3 pg (ref 26.6–33.0)
MCHC: 33.7 g/dL (ref 31.5–35.7)
MCV: 87 fL (ref 79–97)
Platelets: 238 10*3/uL (ref 150–450)
RBC: 4.67 x10E6/uL (ref 3.77–5.28)
RDW: 13 % (ref 11.7–15.4)
WBC: 8.1 10*3/uL (ref 3.4–10.8)

## 2022-03-29 LAB — BASIC METABOLIC PANEL
BUN/Creatinine Ratio: 23 (ref 12–28)
BUN: 23 mg/dL (ref 8–27)
CO2: 26 mmol/L (ref 20–29)
Calcium: 9.4 mg/dL (ref 8.7–10.3)
Chloride: 106 mmol/L (ref 96–106)
Creatinine, Ser: 0.99 mg/dL (ref 0.57–1.00)
Glucose: 109 mg/dL — ABNORMAL HIGH (ref 70–99)
Potassium: 4.5 mmol/L (ref 3.5–5.2)
Sodium: 145 mmol/L — ABNORMAL HIGH (ref 134–144)
eGFR: 65 mL/min/{1.73_m2} (ref >59)

## 2022-04-05 ENCOUNTER — Encounter (HOSPITAL_COMMUNITY)
Admission: RE | Disposition: A | Discharge status: Home / Self Care | Payer: Self-pay | Source: Home / Self Care | Attending: Cardiology

## 2022-04-05 ENCOUNTER — Ambulatory Visit (HOSPITAL_COMMUNITY)
Admission: RE | Admit: 2022-04-05 | Discharge: 2022-04-05 | Disposition: A | Discharge status: Home / Self Care | Payer: 59 | Attending: Cardiology | Admitting: Cardiology

## 2022-04-05 ENCOUNTER — Other Ambulatory Visit: Payer: Self-pay

## 2022-04-05 DIAGNOSIS — R9439 Abnormal result of other cardiovascular function study: Secondary | ICD-10-CM | POA: Diagnosis present

## 2022-04-05 DIAGNOSIS — R6889 Other general symptoms and signs: Secondary | ICD-10-CM | POA: Diagnosis not present

## 2022-04-05 DIAGNOSIS — E669 Obesity, unspecified: Secondary | ICD-10-CM | POA: Insufficient documentation

## 2022-04-05 DIAGNOSIS — E119 Type 2 diabetes mellitus without complications: Secondary | ICD-10-CM | POA: Diagnosis not present

## 2022-04-05 DIAGNOSIS — I2584 Coronary atherosclerosis due to calcified coronary lesion: Secondary | ICD-10-CM | POA: Diagnosis not present

## 2022-04-05 DIAGNOSIS — R000 Tachycardia, unspecified: Secondary | ICD-10-CM | POA: Diagnosis not present

## 2022-04-05 DIAGNOSIS — I2510 Atherosclerotic heart disease of native coronary artery without angina pectoris: Secondary | ICD-10-CM

## 2022-04-05 DIAGNOSIS — I25118 Atherosclerotic heart disease of native coronary artery with other forms of angina pectoris: Secondary | ICD-10-CM | POA: Insufficient documentation

## 2022-04-05 DIAGNOSIS — E785 Hyperlipidemia, unspecified: Secondary | ICD-10-CM | POA: Diagnosis not present

## 2022-04-05 DIAGNOSIS — Z79899 Other long term (current) drug therapy: Secondary | ICD-10-CM | POA: Diagnosis not present

## 2022-04-05 DIAGNOSIS — Z794 Long term (current) use of insulin: Secondary | ICD-10-CM | POA: Diagnosis not present

## 2022-04-05 DIAGNOSIS — Z6836 Body mass index (BMI) 36.0-36.9, adult: Secondary | ICD-10-CM | POA: Insufficient documentation

## 2022-04-05 DIAGNOSIS — I10 Essential (primary) hypertension: Secondary | ICD-10-CM | POA: Diagnosis not present

## 2022-04-05 HISTORY — PX: LEFT HEART CATH AND CORONARY ANGIOGRAPHY: CATH118249

## 2022-04-05 HISTORY — PX: INTRAVASCULAR PRESSURE WIRE/FFR STUDY: CATH118243

## 2022-04-05 LAB — GLUCOSE, CAPILLARY
Glucose-Capillary: 94 mg/dL (ref 70–99)
Glucose-Capillary: 60 mg/dL — ABNORMAL LOW (ref 70–99)
Glucose-Capillary: 122 mg/dL — ABNORMAL HIGH (ref 70–99)

## 2022-04-05 LAB — POCT ACTIVATED CLOTTING TIME: Activated Clotting Time: 277 seconds

## 2022-04-05 SURGERY — LEFT HEART CATH AND CORONARY ANGIOGRAPHY
Anesthesia: LOCAL

## 2022-04-05 MED ORDER — VERAPAMIL HCL 2.5 MG/ML IV SOLN
INTRAVENOUS | Status: AC
  Filled 2022-04-05: qty 2

## 2022-04-05 MED ORDER — FENTANYL CITRATE (PF) 100 MCG/2ML IJ SOLN
INTRAMUSCULAR | Status: DC | PRN
  Administered 2022-04-05: 25 ug via INTRAVENOUS

## 2022-04-05 MED ORDER — MIDAZOLAM HCL 2 MG/2ML IJ SOLN
INTRAMUSCULAR | Status: DC | PRN
  Administered 2022-04-05: 2 mg via INTRAVENOUS

## 2022-04-05 MED ORDER — IOHEXOL 350 MG/ML SOLN
INTRAVENOUS | Status: DC | PRN
  Administered 2022-04-05: 60 mL

## 2022-04-05 MED ORDER — NITROGLYCERIN 1 MG/10 ML FOR IR/CATH LAB
INTRA_ARTERIAL | Status: AC
  Filled 2022-04-05: qty 10

## 2022-04-05 MED ORDER — SODIUM CHLORIDE 0.9% FLUSH: 3.0000 mL | Freq: Two times a day (BID) | INTRAVENOUS | Status: DC

## 2022-04-05 MED ORDER — VERAPAMIL HCL 2.5 MG/ML IV SOLN
INTRAVENOUS | Status: DC | PRN
  Administered 2022-04-05: 10 mL via INTRA_ARTERIAL

## 2022-04-05 MED ORDER — SODIUM CHLORIDE 0.9 % WEIGHT BASED INFUSION
280.0000 mL/h | INTRAVENOUS | Status: AC
  Administered 2022-04-05: 280 mL/h via INTRAVENOUS

## 2022-04-05 MED ORDER — HEPARIN (PORCINE) IN NACL 1000-0.9 UT/500ML-% IV SOLN
INTRAVENOUS | Status: DC | PRN
  Administered 2022-04-05 (×2): 500 mL

## 2022-04-05 MED ORDER — LIDOCAINE HCL (PF) 1 % IJ SOLN
INTRAMUSCULAR | Status: AC
  Filled 2022-04-05: qty 30

## 2022-04-05 MED ORDER — HEPARIN SODIUM (PORCINE) 1000 UNIT/ML IJ SOLN
INTRAMUSCULAR | Status: AC
  Filled 2022-04-05: qty 10

## 2022-04-05 MED ORDER — HEPARIN SODIUM (PORCINE) 1000 UNIT/ML IJ SOLN
INTRAMUSCULAR | Status: DC | PRN
  Administered 2022-04-05: 6000 [IU] via INTRAVENOUS
  Administered 2022-04-05: 3000 [IU] via INTRAVENOUS

## 2022-04-05 MED ORDER — NITROGLYCERIN 1 MG/10 ML FOR IR/CATH LAB
INTRA_ARTERIAL | Status: DC | PRN
  Administered 2022-04-05: 200 ug via INTRACORONARY

## 2022-04-05 MED ORDER — LIDOCAINE HCL (PF) 1 % IJ SOLN
INTRAMUSCULAR | Status: DC | PRN
  Administered 2022-04-05: 2 mL

## 2022-04-05 MED ORDER — MIDAZOLAM HCL 2 MG/2ML IJ SOLN
INTRAMUSCULAR | Status: AC
  Filled 2022-04-05: qty 2

## 2022-04-05 MED ORDER — FENTANYL CITRATE (PF) 100 MCG/2ML IJ SOLN
INTRAMUSCULAR | Status: AC
  Filled 2022-04-05: qty 2

## 2022-04-05 MED ORDER — SODIUM CHLORIDE 0.9 % WEIGHT BASED INFUSION: 93.0000 mL/h | INTRAVENOUS | Status: DC

## 2022-04-05 MED ORDER — ASPIRIN 81 MG PO CHEW: 81.0000 mg | CHEWABLE_TABLET | ORAL | Status: DC

## 2022-04-05 MED ORDER — SODIUM CHLORIDE 0.9 % IV SOLN: 250.0000 mL | INTRAVENOUS | Status: DC | PRN

## 2022-04-05 MED ORDER — SODIUM CHLORIDE 0.9% FLUSH: 3.0000 mL | INTRAVENOUS | Status: DC | PRN

## 2022-04-05 SURGICAL SUPPLY — 13 items
CATH LAUNCHER 6FR EBU3.5 (CATHETERS) IMPLANT
CATH OPTITORQUE TIG 4.0 5F (CATHETERS) IMPLANT
DEVICE RAD COMP TR BAND LRG (VASCULAR PRODUCTS) IMPLANT
GLIDESHEATH SLEND SS 6F .021 (SHEATH) IMPLANT
GUIDEWIRE INQWIRE 1.5J.035X260 (WIRE) IMPLANT
GUIDEWIRE PRESSURE X 175 (WIRE) IMPLANT
INQWIRE 1.5J .035X260CM (WIRE) ×1
KIT ESSENTIALS PG (KITS) IMPLANT
KIT HEART LEFT (KITS) ×1 IMPLANT
PACK CARDIAC CATHETERIZATION (CUSTOM PROCEDURE TRAY) ×1 IMPLANT
SHEATH PROBE COVER 6X72 (BAG) IMPLANT
TRANSDUCER W/STOPCOCK (MISCELLANEOUS) ×1 IMPLANT
TUBING CIL FLEX 10 FLL-RA (TUBING) ×1 IMPLANT

## 2022-04-05 NOTE — Interval H&P Note (Signed)
History and Physical Interval Note:  04/05/2022 11:07 AM  Kayla Berger  has presented today for surgery, with the diagnosis of positive stress test.  The various methods of treatment have been discussed with the patient and family. After consideration of risks, benefits and other options for treatment, the patient has consented to  Procedure(s): LEFT HEART CATH AND CORONARY ANGIOGRAPHY (N/A) and possible coronary angioplasty as a surgical intervention.  The patient's history has been reviewed, patient examined, no change in status, stable for surgery.  I have reviewed the patient's chart and labs.  Questions were answered to the patient's satisfaction.   Cath Lab Visit (complete for each Cath Lab visit)  Clinical Evaluation Leading to the Procedure:   ACS: No.  Non-ACS:    Anginal Classification: CCS III  Anti-ischemic medical therapy: Minimal Therapy (1 class of medications)  Non-Invasive Test Results: Intermediate-risk stress test findings: cardiac mortality 1-3%/year  Prior CABG: No previous CABG   Adrian Prows

## 2022-04-08 ENCOUNTER — Encounter (HOSPITAL_COMMUNITY): Payer: Self-pay | Admitting: Cardiology

## 2022-04-12 ENCOUNTER — Telehealth: Payer: Self-pay | Admitting: Cardiology

## 2022-04-12 ENCOUNTER — Ambulatory Visit: Payer: Self-pay | Admitting: Emergency Medicine

## 2022-04-12 NOTE — Heart Team MDD (Signed)
   Heart Team Multi-Disciplinary Discussion  Patient: Kayla Berger  DOB: 07-Feb-1960  MRN: [id number]   Date: 04/12/2022  5:25 PM    Attendees: Interventional Cardiology: Lenna Sciara, MD Glenetta Hew, MD Adrian Prows, MD Vernell Leep, MD Peter Martinique, MD Kathlyn Sacramento, MD Nelva Bush, MD Sherren Mocha, MD  Cardiothoracic Surgery: Melodie Bouillon, MD  Advanced Heart Failure: Loralie Champagne, MD    Patient History: Pt is a 62 year old female with c/o dyspnea on exertion and fatigue, even with minimal activity. CT of the chest and abdomen on 12/23 revealed severe calcification especially in the LAD territory.   Risk Factors: Hypertension Diabetes Mellitus Hyperlipidemia Additional Risk Factors: obesity     Review of Prior Angiography and PCI Procedures: The left heart cath and coronary angiography performed on 04/05/22 was reviewed and discussed in detail with focus on severely calcified LAD from proximal to mid segment and a calcified 90% stenosis noted in the D1 proximal segment.    Discussion: After presentation, consideration of treatment options occurred contrasting medical therapy versus PCI.  Recommendation: Medical therapy and if failure of medical therapy and increasing symptoms, consider PCI.   Recommendations: Medical Therapy      Dan Europe, RN  04/12/2022 5:25 PM

## 2022-04-12 NOTE — Telephone Encounter (Signed)
Heart Team Meeting 04/12/22   Cardiologists present:  Mo Arida Manish Patwardhan Peter Martinique, Michael Cooper Chris End Vishnu Lee (CT Surgery)  Kayla Berger [birth year] cardiac catheterization report was discussed, angiograms reviewed.  After review and evaluation of her RFR, team felt that best option is medical therapy for now and risk modification, lesions are calcified and appear stable hence if medical therapy fails or she continues to have frequent anginal episodes or heart failure symptoms, then consider revascularization, probably CABG.   Adrian Prows, MD, [facility] 04/12/2022, [place] PM Office: [phone] Fax: [phone] Pager: [phone]

## 2022-04-18 ENCOUNTER — Encounter: Payer: Self-pay | Admitting: Cardiology

## 2022-04-18 ENCOUNTER — Ambulatory Visit: Payer: 59 | Admitting: Cardiology

## 2022-04-18 VITALS — BP 126/79 | HR 80 | Resp 16 | Ht 63.5 in | Wt 207.0 lb

## 2022-04-18 DIAGNOSIS — I25118 Atherosclerotic heart disease of native coronary artery with other forms of angina pectoris: Secondary | ICD-10-CM

## 2022-04-18 DIAGNOSIS — Z794 Long term (current) use of insulin: Secondary | ICD-10-CM

## 2022-04-18 DIAGNOSIS — E66812 Obesity, class 2: Secondary | ICD-10-CM

## 2022-04-18 DIAGNOSIS — I10 Essential (primary) hypertension: Secondary | ICD-10-CM

## 2022-04-18 DIAGNOSIS — E7800 Pure hypercholesterolemia, unspecified: Secondary | ICD-10-CM

## 2022-04-18 DIAGNOSIS — E1122 Type 2 diabetes mellitus with diabetic chronic kidney disease: Secondary | ICD-10-CM

## 2022-04-18 DIAGNOSIS — I2510 Atherosclerotic heart disease of native coronary artery without angina pectoris: Secondary | ICD-10-CM

## 2022-04-18 MED ORDER — ISOSORBIDE MONONITRATE ER 30 MG PO TB24: 30.0000 mg | ORAL_TABLET | Freq: Every day | ORAL | 2 refills | Status: DC

## 2022-04-18 NOTE — Progress Notes (Signed)
Primary Physician/Referring:  Janie Morning, DO  Patient ID: Kayla Berger, female    DOB: 1960/11/24, 62 y.o.   MRN: [id number]  Chief Complaint  Patient presents with   Coronary Artery Disease   Post cath   HPI:    Kayla Berger  is a 62 y.o. Patient with hypertension, hyperlipidemia, diabetes mellitus, decreased exercise tolerance, sinus tachycardia seen in the emergency room on 07/2021, for acute renal failure was precipitated by dehydration and hypovolemic shock after she started with Ozempic leading to hospitalization.  Due to worsening dyspnea and also chest pain, she underwent cardiac catheterization on 04/05/2022 revealing calcific high-grade stenosis in D1 and also at least a 70 to 80% stenosis in the mid LAD.  She presents to discuss options.   She still has occasional episodes of "heartburn" at least 2-3 times a week and has used sublingual nitroglycerin with relief.  Dyspnea has remained stable.  No PND or orthopnea, she has not had any complications from cardiac catheterization.  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   Migraines    occ   PCOS (polycystic ovarian syndrome) 1986   PONV (postoperative nausea and vomiting)    Renal disorder    Shoulder pain    Thyroid disease    Trochanteric bursitis of left hip    Vaginal cysts    4 have been removed   Past Surgical History:  Procedure Laterality Date   APPENDECTOMY  1967   CARDIAC CATHETERIZATION Bilateral    cataracts   CHOLECYSTECTOMY N/A 02/16/2021   Procedure: LAPAROSCOPIC CHOLECYSTECTOMY;  Surgeon: Dwan Bolt, MD;  Location: [facility];  Service: General;  Laterality: N/A;   INTRAVASCULAR PRESSURE WIRE/FFR  STUDY N/A 04/05/2022   Procedure: INTRAVASCULAR PRESSURE WIRE/FFR STUDY;  Surgeon: Adrian Prows, MD;  Location: [facility];  Service: Cardiovascular;  Laterality: N/A;   KYPHOPLASTY N/A 10/31/2016   Procedure: KYPHOPLASTY LUMBAR ONE, LUMBAR TWO AND  LUMBAR THREE;  Surgeon: Melina Schools, MD;  Location: [facility];  Service: Orthopedics;  Laterality: N/A;   LEFT HEART CATH AND CORONARY ANGIOGRAPHY N/A 04/05/2022   Procedure: LEFT HEART CATH AND CORONARY ANGIOGRAPHY;  Surgeon: Adrian Prows, MD;  Location: [facility];  Service: Cardiovascular;  Laterality: N/A;   nsvd  2001   normal vaginal del   sonobysterogram, laporoscopy  1997   fertility test   Family History  Problem Relation Age of Onset   Hypertension Mother    Diabetes Mother    Stroke Mother 73       first one, (pt had 3 total)   Hypertension Father    Cancer Father    Hypertension Sister    Heart disease Sister    Hypertension Maternal Grandmother    Heart attack Maternal Grandfather    Diabetes Maternal Grandfather    Diabetes Paternal Grandfather     Social History   Tobacco Use   Smoking status: Never   Smokeless tobacco: Never  Substance Use Topics   Alcohol use: No   Marital Status: Divorced  ROS  Review of Systems  Constitutional: Positive for malaise/fatigue  and weight gain.  Cardiovascular:  Negative for chest pain, dyspnea on exertion and leg swelling.   Objective  Blood pressure 126/79, pulse 80, resp. rate 16, height 5' 3.5" (1.613 m), weight 207 lb (93.9 kg), last menstrual period 10/06/2012, SpO2 95 %. Body mass index is 36.09 kg/m.     04/18/2022    1:03 PM 04/05/2022    2:10 PM 04/05/2022    1:55 PM  Vitals with BMI  Height 5' 3.5"    Weight 207 lbs    BMI [id number]    Systolic [id number]  Diastolic 79 67 70  Pulse 80 83 83    Physical Exam Constitutional:      Appearance: She is obese.  Neck:     Vascular: No JVD.  Cardiovascular:     Rate and Rhythm: Regular rhythm. Tachycardia  present.     Pulses: Intact distal pulses.     Heart sounds: Normal heart sounds. No murmur heard.    No gallop.  Pulmonary:     Effort: Pulmonary effort is normal.     Breath sounds: Normal breath sounds.  Abdominal:     General: Bowel sounds are normal.     Palpations: Abdomen is soft.  Musculoskeletal:     Right lower leg: No edema.     Left lower leg: No edema.    Medications and allergies   Allergies  Allergen Reactions   Hydrocodone Itching, Swelling and Other (See Comments)    Numbness of the face/hands and tingling also     Medication list after today's encounter   Current Outpatient Medications:    Alpha Lipoic Acid 200 MG CAPS, Take 200 mg by mouth at bedtime., Disp: , Rfl:    APPLE CIDER VINEGAR PO, Take 450 mg by mouth daily., Disp: , Rfl:    ascorbic acid (VITAMIN C) 500 MG tablet, Take 500 mg by mouth daily., Disp: , Rfl:    aspirin 81 MG chewable tablet, Chew 81 mg by mouth in the morning., Disp: , Rfl:    atorvastatin (LIPITOR) 10 MG tablet, Take 10 mg by mouth at bedtime., Disp: , Rfl:    Biotin 10000 MCG TABS, Take 10,000 mcg by mouth every evening., Disp: , Rfl:    cetirizine (ZYRTEC) 10 MG tablet, Take 10 mg by mouth in the morning., Disp: , Rfl:    Cholecalciferol 25 MCG (1000 UT) capsule, Take 1,000 Units by mouth 2 (two) times daily., Disp: , Rfl:    CRANBERRY PO, Take 15,000 mg by mouth in the morning., Disp: , Rfl:    diltiazem (CARDIZEM CD) 180 MG 24 hr capsule, Take 1 capsule (180 mg total) by mouth daily., Disp: 30 capsule, Rfl: 2   DULoxetine (CYMBALTA) 60 MG capsule, Take 60 mg by mouth 2 (two) times daily., Disp: , Rfl:    EXCEDRIN MIGRAINE 250-250-65 MG tablet, Take 2 tablets by mouth every 6 (six) hours as needed for headache or migraine., Disp: , Rfl:    gabapentin (NEURONTIN) 800 MG tablet, Take 400 mg by mouth at bedtime., Disp: , Rfl:    HUMALOG KWIKPEN 100 UNIT/ML KwikPen, Inject 5-12 Units into the skin 3 (three) times daily as needed (for  a BGL greater than 100)., Disp: , Rfl:    insulin glargine, 2 Unit Dial, (TOUJEO MAX SOLOSTAR) 300 UNIT/ML Solostar Pen, Inject 10 Units into the skin at bedtime. (Patient taking differently: Inject 58 Units into the skin at bedtime.), Disp: , Rfl:    isosorbide mononitrate (  IMDUR) 30 MG 24 hr tablet, Take 1 tablet (30 mg total) by mouth daily., Disp: 30 tablet, Rfl: 2   JARDIANCE 10 MG TABS tablet, Take 10 mg by mouth in the morning., Disp: , Rfl:    metoprolol tartrate (LOPRESSOR) 25 MG tablet, Take 1 tablet (25 mg total) by mouth 2 (two) times daily., Disp: 60 tablet, Rfl: 2   Multiple Vitamins-Minerals (CENTRUM WOMEN) TABS, Take 1 tablet by mouth daily with breakfast., Disp: , Rfl:    pantoprazole (PROTONIX) 40 MG tablet, Take 1 tablet (40 mg total) by mouth 2 (two) times daily. (Patient taking differently: Take 40 mg by mouth daily.), Disp: 60 tablet, Rfl: 0   Semaglutide (RYBELSUS) 3 MG TABS, Take 1 tablet (3 mg total) by mouth daily., Disp: 30 tablet, Rfl: 0   Teriparatide, Recombinant, 620 MCG/2.48ML SOPN, Inject 20 mcg into the skin at bedtime., Disp: , Rfl:    zinc gluconate 50 MG tablet, Take 50 mg by mouth in the morning., Disp: , Rfl:   Laboratory examination:   Lab Results  Component Value Date   NA 145 (H) 03/28/2022   K 4.5 03/28/2022   CO2 26 03/28/2022   GLUCOSE 109 (H) 03/28/2022   BUN 23 03/28/2022   CREATININE 0.99 03/28/2022   CALCIUM 9.4 03/28/2022   EGFR 65 03/28/2022   GFRNONAA 50 (L) 01/19/2022       Latest Ref Rng & Units 03/28/2022    8:11 AM 01/19/2022    5:28 PM 01/19/2022    5:15 PM  CMP  Glucose 70 - 99 mg/dL 109  138  136   BUN 8 - 27 mg/dL 23  31  23    Creatinine 0.57 - 1.00 mg/dL 0.99  1.20  1.24   Sodium 134 - 144 mmol/L 145  141  139   Potassium 3.5 - 5.2 mmol/L 4.5  4.5  4.4   Chloride 96 - 106 mmol/L 106  103  101   CO2 20 - 29 mmol/L 26   28   Calcium 8.7 - 10.3 mg/dL 9.4   9.0   Total Protein 6.5 - 8.1 g/dL   6.4   Total Bilirubin 0.3 -  1.2 mg/dL   0.6   Alkaline Phos 38 - 126 U/L   90   AST 15 - 41 U/L   29   ALT 0 - 44 U/L   25       Latest Ref Rng & Units 03/28/2022    8:11 AM 01/19/2022    5:28 PM 01/19/2022    5:15 PM  CBC  WBC 3.4 - 10.8 x10E3/uL 8.1   6.9   Hemoglobin 11.1 - 15.9 g/dL 13.7  12.9  12.3   Hematocrit 34.0 - 46.6 % 40.7  38.0  38.1   Platelets 150 - 450 x10E3/uL 238   220    HEMOGLOBIN A1C Lab Results  Component Value Date   HGBA1C 6.6 (H) 08/04/2021   MPG 142.72 08/04/2021   TSH Recent Labs    08/17/21 1500  TSH 3.256   External labs:  Labs 08/23/2021:  Hb 9.3/HCT 28.7, platelets 307.  Normal indicis.  BUN 45, creatinine 1.51, EGFR 37 mL, potassium 4.7, LFTs normal.  A1c 7.6%.  TSH normal at 3.97.  Vitamin D 55.7.  Labs 04/12/2021:  Total cholesterol 121, triglycerides 145, HDL 40, LDL 52.  Radiology:   CT chest and abdomen 01/19/2022: Cardiovascular: There is no cardiomegaly or pericardial effusion. Advanced coronary vascular calcification of the LAD.  Mild atherosclerotic calcification of the thoracic aorta. No aneurysmal dilatation or dissection.  The central pulmonary arteries appear unremarkable.  Cardiac Studies:   PCV MYOCARDIAL PERFUSION WO LEXISCAN 09/12/2021  Narrative Exercise nuclear stress test 09/12/21 Myocardial perfusion is abnormal. Overall LV systolic function is normal without regional wall motion abnormalities. Intermediate risk study. There is a reversible mild defect in the inferior and apical regions. Stress LV EF: 51%. Abnormal ECG stress. The patient exercised for 6 minutes and 41 seconds of a Modified Bruce protocol, achieving approximately 8.09 METs and 89% MPHR. The heart rate response was normal. The blood pressure response was normal. No previous exam available for comparison.   PCV ECHOCARDIOGRAM COMPLETE 10/05/2021  Narrative Echocardiogram 10/05/2021: Left ventricle cavity is normal in size and wall thickness. Normal global wall motion.  Normal LV systolic function with EF 61%. Normal diastolic filling pattern. No significant valvular abnormality. Normal right atrial pressure.  Conclusion  Left Heart Catheterization 04/05/22:  LV: 137/3, EDP 20 mmHg.  Ao 01/29/2027/68, mean 96 mmHg.  No pressure gradient across the aortic valve.  LA 7 mmHg.   RCA: Normal right coronary artery. LM: Large-caliber vessel, smooth and normal. LCx: Large-caliber vessel, mid segment has a 30% stenosis, gives origin to a moderate-sized OM 2 but otherwise small OM 1 and 3. LAD: Severely calcified all the way from the proximal to mid segment.  Gives origin to a moderate to large size diagonal 1.  D1 in the proximal segment has a calcific 90% stenosis.  LAD has tandem severe calcific 80% stenosis.  In some views appear to be 40 to 50%. RFR: LAD RFR performed, at rest 0.87, 30 seconds after 200 mcg IC nitroglycerin RFR was 0.80 suggestive of physiologically significant LAD stenosis.    Recommendation: In view of heavy calcification, she may benefit from CABG to LAD and D1.  I will discuss with heart team to see whether she would benefit from focal stenting in the tandem 80% stenosis of the LAD and leaving diffuse disease alone.    EKG:   EKG 08/23/2021: Normal sinus rhythm at the rate of 78 bpm, poor R wave progression, probably normal variant.  No evidence of ischemia otherwise normal EKG.    EKG 08/17/2021: Sinus tachycardia at rate of 143 bpm.  Otherwise normal EKG.  Assessment     ICD-10-CM   1. Coronary artery disease due to calcified coronary lesion  I25.10 isosorbide mononitrate (IMDUR) 30 MG 24 hr tablet   I25.84 Semaglutide (RYBELSUS) 3 MG TABS    Lipoprotein A (LPA)    2. Primary hypertension  99991111 Basic metabolic panel    3. Type 2 diabetes mellitus with stage 3a chronic kidney disease, with long-term current use of insulin (HCC)  E11.22 Semaglutide (RYBELSUS) 3 MG TABS   N18.31    Z79.4     4. Class 2 severe obesity due to excess  calories with serious comorbidity and body mass index (BMI) of 35.0 to 35.9 in adult (HCC)  E66.01    Z68.35     5. Pure hypercholesterolemia  E78.00 Lipoprotein A (LPA)       Orders Placed This Encounter  Procedures   Basic metabolic panel   Lipoprotein A (LPA)    Meds ordered this encounter  Medications   isosorbide mononitrate (IMDUR) 30 MG 24 hr tablet    Sig: Take 1 tablet (30 mg total) by mouth daily.    Dispense:  30 tablet    Refill:  2   Semaglutide (RYBELSUS)  3 MG TABS    Sig: Take 1 tablet (3 mg total) by mouth daily.    Dispense:  30 tablet    Refill:  0    There are no discontinued medications.     Recommendations:   URWA TERRACINA is a 62 y.o.  Patient with hypertension, hyperlipidemia, diabetes mellitus, decreased exercise tolerance, sinus tachycardia seen in the emergency room on 07/2021, for acute renal failure was precipitated by dehydration and hypovolemic shock after she started with Ozempic leading to hospitalization.  Due to worsening dyspnea and also chest pain, she underwent cardiac catheterization on 04/05/2022 revealing calcific high-grade stenosis in D1 and also at least a 70 to 80% stenosis in the mid LAD.  She presents to discuss options.     1. Coronary artery disease involving native coronary artery due to calcified coronary lesion  Her coronary angiograms discussed in detail at the heart team meeting, recommended medical therapy.  Will optimize her medical care, blood pressure is well-controlled, lipids are under good control as well, in view of calcified coronaries, presentation would be more consistent with angina and unstable angina of STEMI unless there is progression of stenosis severity.  I have added isosorbide mononitrate 30 mg daily.  She is already on a beta-blocker and calcium channel blocker.  Encounter Date: 04/12/2022   Attested        Attestation signed by Adrian Prows, MD at 04/12/2022 10:34 PM   I was present during the discussion  and will follow the guidelines. Will discuss with patient on her visit with me soon            Heart Team Multi-Disciplinary Discussion   Patient: MADISON HERZOG  DOB: 06/27/1960  MRN: [id number]    Date: 04/12/2022  5:25 PM      Attendees: Interventional Cardiology: Lenna Sciara, MD Glenetta Hew, MD Adrian Prows, MD Vernell Leep, MD Peter Martinique, MD Kathlyn Sacramento, MD Nelva Bush, MD Sherren Mocha, MD   Cardiothoracic Surgery: Melodie Bouillon, MD   Advanced Heart Failure: Loralie Champagne, MD       Patient History: Pt is a 63 year old female with c/o dyspnea on exertion and fatigue, even with minimal activity. CT of the chest and abdomen on 12/23 revealed severe calcification especially in the LAD territory.     Risk Factors: Hypertension Diabetes Mellitus Hyperlipidemia Additional Risk Factors: obesity         Review of Prior Angiography and PCI Procedures: The left heart cath and coronary angiography performed on 04/05/22 was reviewed and discussed in detail with focus on severely calcified LAD from proximal to mid segment and a calcified 90% stenosis noted in the D1 proximal segment.      Discussion: After presentation, consideration of treatment options occurred contrasting medical therapy versus PCI.  Recommendation: Medical therapy and if failure of medical therapy and increasing symptoms, consider PCI.     Recommendations: Medical Therapy         Dan Europe, RN  04/12/2022 5:25 PM          Cosigned by: Adrian Prows, MD at 04/12/2022 10:34 PM  Electronically signed by Dan Europe, RN at 04/12/2022  5:58 PM Electronically signed by Adrian Prows, MD at 04/12/2022 10:34 PM  2. Primary hypertension Blood pressure is well-controlled, continue present medical therapy.  3. Type 2 diabetes mellitus with stage 3a chronic kidney disease, with long-term current use of insulin (HCC) In view of underlying coronary disease, moderate obesity,  she would  be considered morbidly obese, she had responded well to Ozempic but developed severe dehydration and acute renal failure due to nausea and vomiting, patient admits to eating excessively as well leading to the nausea and vomiting issues, I discussed with Dr. Chalmers Cater, there is no pancreatitis on presentation, will start her on Rybelsus at low-dose 3 mg and if she tolerates this we will slowly exchange to [place] with careful monitoring.  4. Class 2 severe obesity due to excess calories with serious comorbidity and body mass index (BMI) of 35.0 to 35.9 in adult [facility]) She had responded very well to Ozempic but then developed dehydration and nausea or vomiting, patient is aware of the risk of similar presentation with Rybelsus as well, she will be closely monitored.  Patient's sister is a physician, I spoke to her over the telephone and explained our decision making today. Will send my note to Dr. Hassan Buckler (Endocrines)  Adrian Prows, MD, [facility] 04/19/2022, 6:57 AM Office: [phone]

## 2022-04-19 ENCOUNTER — Encounter: Payer: Self-pay | Admitting: Cardiology

## 2022-04-19 MED ORDER — RYBELSUS 3 MG PO TABS: 1.0000 | ORAL_TABLET | Freq: Every day | ORAL | 0 refills | Status: DC

## 2022-04-28 ENCOUNTER — Encounter: Payer: Self-pay | Admitting: Cardiology

## 2022-04-28 DIAGNOSIS — E8779 Other fluid overload: Secondary | ICD-10-CM

## 2022-04-29 MED ORDER — FUROSEMIDE 20 MG PO TABS: 20.0000 mg | ORAL_TABLET | Freq: Every day | ORAL | 2 refills | Status: DC | PRN

## 2022-04-29 MED ORDER — POTASSIUM CHLORIDE ER 10 MEQ PO TBCR: 10.0000 meq | EXTENDED_RELEASE_TABLET | Freq: Every day | ORAL | 2 refills | Status: DC | PRN

## 2022-04-29 NOTE — Telephone Encounter (Signed)
From patient.

## 2022-04-29 NOTE — Telephone Encounter (Signed)
ICD-10-CM   1. Other hypervolemia  E87.79 furosemide (LASIX) 20 MG tablet    potassium chloride (KLOR-CON) 10 MEQ tablet     No orders of the defined types were placed in this encounter.   Meds ordered this encounter  Medications   furosemide (LASIX) 20 MG tablet    Sig: Take 1 tablet (20 mg total) by mouth daily as needed for fluid (Take for 2-3 days one tab daily as needed).    Dispense:  30 tablet    Refill:  2   potassium chloride (KLOR-CON) 10 MEQ tablet    Sig: Take 1 tablet (10 mEq total) by mouth daily as needed (With furosemide only).    Dispense:  30 tablet    Refill:  2

## 2022-05-10 LAB — BASIC METABOLIC PANEL
BUN/Creatinine Ratio: 22 (ref 12–28)
BUN: 24 mg/dL (ref 8–27)
CO2: 23 mmol/L (ref 20–29)
Calcium: 9.4 mg/dL (ref 8.7–10.3)
Chloride: 107 mmol/L — ABNORMAL HIGH (ref 96–106)
Creatinine, Ser: 1.09 mg/dL — ABNORMAL HIGH (ref 0.57–1.00)
Glucose: 76 mg/dL (ref 70–99)
Potassium: 4.4 mmol/L (ref 3.5–5.2)
Sodium: 146 mmol/L — ABNORMAL HIGH (ref 134–144)
eGFR: 58 mL/min/{1.73_m2} — ABNORMAL LOW (ref >59)

## 2022-05-10 LAB — LIPOPROTEIN A (LPA): Lipoprotein (a): 104.5 nmol/L — ABNORMAL HIGH (ref <75.0)

## 2022-05-14 ENCOUNTER — Ambulatory Visit: Payer: 59 | Admitting: Cardiology

## 2022-05-14 ENCOUNTER — Encounter: Payer: Self-pay | Admitting: Cardiology

## 2022-05-14 VITALS — BP 126/72 | HR 89 | Resp 16 | Ht 63.0 in | Wt 211.8 lb

## 2022-05-14 DIAGNOSIS — I2510 Atherosclerotic heart disease of native coronary artery without angina pectoris: Secondary | ICD-10-CM

## 2022-05-14 DIAGNOSIS — N1831 Chronic kidney disease, stage 3a: Secondary | ICD-10-CM

## 2022-05-14 NOTE — Progress Notes (Signed)
Primary Physician/Referring:  Irena Reichmann, DO  Patient ID: Kayla Berger, female    DOB: July 02, 1960, 62 y.o.   MRN: [id number]  No chief complaint on file.  HPI:    Kayla Berger  is a 62 y.o. Patient with hypertension, hyperlipidemia, diabetes mellitus, decreased exercise tolerance, underwent cardiac catheterization on 04/05/2022 revealing calcific high-grade stenosis in D1 and also at least a 70 to 80% stenosis in the mid LAD.  After heart team discussions, it was recommended that we continue medical therapy and to proceed with PCI only if she became symptomatic with angina.  Secondary prevention was recommended.  Patient had done well and was losing weight on Ozempic but admitted with dehydration and acute renal failure.  I had started her on Rybelsus, she now presents for follow-up.  Dyspnea has remained stable.  No PND or orthopnea, she has not had any complications from cardiac catheterization.  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   Migraines    occ   PCOS (polycystic ovarian syndrome) 1986   PONV (postoperative nausea and vomiting)    Renal disorder    Shoulder pain    Thyroid disease    Trochanteric bursitis of left hip    Vaginal cysts    4 have been removed   Past Surgical History:  Procedure Laterality Date   APPENDECTOMY  1967   CARDIAC CATHETERIZATION Bilateral    cataracts   CHOLECYSTECTOMY N/A 02/16/2021   Procedure: LAPAROSCOPIC CHOLECYSTECTOMY;  Surgeon: [name], MD;  Location: [facility] OR;  Service: General;  Laterality: N/A;   CORONARY PRESSURE/FFR STUDY N/A 04/05/2022   Procedure: INTRAVASCULAR PRESSURE WIRE/FFR STUDY;  Surgeon: Yates Decamp, MD;   Location: [facility];  Service: Cardiovascular;  Laterality: N/A;   KYPHOPLASTY N/A 10/31/2016   Procedure: KYPHOPLASTY LUMBAR ONE, LUMBAR TWO AND  LUMBAR THREE;  Surgeon: Venita Lick, MD;  Location: [facility] OR;  Service: Orthopedics;  Laterality: N/A;   LEFT HEART CATH AND CORONARY ANGIOGRAPHY N/A 04/05/2022   Procedure: LEFT HEART CATH AND CORONARY ANGIOGRAPHY;  Surgeon: Yates Decamp, MD;  Location: [facility];  Service: Cardiovascular;  Laterality: N/A;   nsvd  2001   normal vaginal del   sonobysterogram, laporoscopy  1997   fertility test   Family History  Problem Relation Age of Onset   Hypertension Mother    Diabetes Mother    Stroke Mother 46       first one, (pt had 3 total)   Hypertension Father    Cancer Father    Hypertension Sister    Heart disease Sister    Hypertension Maternal Grandmother    Heart attack Maternal Grandfather    Diabetes Maternal Grandfather    Diabetes Paternal Grandfather     Social History   Tobacco Use   Smoking status: Never   Smokeless tobacco: Never  Substance Use Topics   Alcohol use: No   Marital Status: Divorced  ROS  Review of Systems  Constitutional: Positive for malaise/fatigue and weight gain.  Cardiovascular:  Negative for chest pain, dyspnea on exertion and leg swelling.  Objective  Blood pressure 126/72, pulse 89, resp. rate 16, height  (1.6 m), weight 211 lb 12.8 oz (96.1 kg), last menstrual period 10/06/2012, SpO2 97 %. Body mass index is 37.52 kg/m.     05/14/2022    1:50 PM 04/18/2022    1:03 PM 04/05/2022    2:10 PM  Vitals with BMI  Height  5' 3.5"   Weight 211 lbs 13 oz 207 lbs   BMI 37.53 36.09   Systolic 126 126 161  Diastolic 72 79 67  Pulse 89 80 83    Physical Exam Constitutional:      Appearance: She is obese.  Neck:     Vascular: No JVD.  Cardiovascular:     Rate and Rhythm: Regular rhythm. Tachycardia present.     Pulses: Intact distal pulses.     Heart sounds: Normal heart  sounds. No murmur heard.    No gallop.  Pulmonary:     Effort: Pulmonary effort is normal.     Breath sounds: Normal breath sounds.  Abdominal:     General: Bowel sounds are normal.     Palpations: Abdomen is soft.  Musculoskeletal:     Right lower leg: No edema.     Left lower leg: No edema.    Medications and allergies   Allergies  Allergen Reactions   Hydrocodone Itching, Swelling and Other (See Comments)    Numbness of the face/hands and tingling also     Medication list after today's encounter   Current Outpatient Medications:    Alpha Lipoic Acid 200 MG CAPS, Take 200 mg by mouth at bedtime., Disp: , Rfl:    APPLE CIDER VINEGAR PO, Take 450 mg by mouth daily., Disp: , Rfl:    ascorbic acid (VITAMIN C) 500 MG tablet, Take 500 mg by mouth daily., Disp: , Rfl:    aspirin 81 MG chewable tablet, Chew 81 mg by mouth in the morning., Disp: , Rfl:    atorvastatin (LIPITOR) 10 MG tablet, Take 10 mg by mouth at bedtime., Disp: , Rfl:    Biotin 09604 MCG TABS, Take 10,000 mcg by mouth every evening., Disp: , Rfl:    cetirizine (ZYRTEC) 10 MG tablet, Take 10 mg by mouth in the morning., Disp: , Rfl:    Cholecalciferol 25 MCG (1000 UT) capsule, Take 1,000 Units by mouth 2 (two) times daily., Disp: , Rfl:    CRANBERRY PO, Take 15,000 mg by mouth in the morning., Disp: , Rfl:    diltiazem (CARDIZEM CD) 180 MG 24 hr capsule, Take 1 capsule (180 mg total) by mouth daily., Disp: 30 capsule, Rfl: 2   DULoxetine (CYMBALTA) 60 MG capsule, Take 60 mg by mouth 2 (two) times daily., Disp: , Rfl:    EXCEDRIN MIGRAINE 250-250-65 MG tablet, Take 2 tablets by mouth every 6 (six) hours as needed for headache or migraine., Disp: , Rfl:    furosemide (LASIX) 20 MG tablet, Take 1 tablet (20 mg total) by mouth daily as needed for fluid (Take for 2-3 days one tab daily as needed)., Disp: 30 tablet, Rfl: 2   gabapentin (NEURONTIN) 800 MG tablet, Take 400 mg by mouth at bedtime., Disp: , Rfl:    HUMALOG  KWIKPEN 100 UNIT/ML KwikPen, Inject 5-12 Units into the skin 3 (three) times daily as needed (for a BGL greater than 100)., Disp: , Rfl:    insulin glargine, 2 Unit Dial, (TOUJEO MAX SOLOSTAR) 300 UNIT/ML Solostar Pen, Inject 10 Units into the  skin at bedtime. (Patient taking differently: Inject 58 Units into the skin at bedtime.), Disp: , Rfl:    isosorbide mononitrate (IMDUR) 30 MG 24 hr tablet, Take 1 tablet (30 mg total) by mouth daily., Disp: 30 tablet, Rfl: 2   JARDIANCE 10 MG TABS tablet, Take 10 mg by mouth in the morning., Disp: , Rfl:    metoprolol tartrate (LOPRESSOR) 25 MG tablet, Take 1 tablet (25 mg total) by mouth 2 (two) times daily., Disp: 60 tablet, Rfl: 2   Multiple Vitamins-Minerals (CENTRUM WOMEN) TABS, Take 1 tablet by mouth daily with breakfast., Disp: , Rfl:    pantoprazole (PROTONIX) 40 MG tablet, Take 1 tablet (40 mg total) by mouth 2 (two) times daily. (Patient taking differently: Take 40 mg by mouth daily.), Disp: 60 tablet, Rfl: 0   potassium chloride (KLOR-CON) 10 MEQ tablet, Take 1 tablet (10 mEq total) by mouth daily as needed (With furosemide only)., Disp: 30 tablet, Rfl: 2   Teriparatide, Recombinant, 620 MCG/2.48ML SOPN, Inject 20 mcg into the skin at bedtime., Disp: , Rfl:    zinc gluconate 50 MG tablet, Take 50 mg by mouth in the morning., Disp: , Rfl:   Laboratory examination:   Lab Results  Component Value Date   NA 146 (H) 05/09/2022   K 4.4 05/09/2022   CO2 23 05/09/2022   GLUCOSE 76 05/09/2022   BUN 24 05/09/2022   CREATININE 1.09 (H) 05/09/2022   CALCIUM 9.4 05/09/2022   EGFR 58 (L) 05/09/2022   GFRNONAA 50 (L) 01/19/2022       Latest Ref Rng & Units 05/09/2022    8:11 AM 03/28/2022    8:11 AM 01/19/2022    5:28 PM  CMP  Glucose 70 - 99 mg/dL 76  846  962   BUN 8 - 27 mg/dL Creatinine 0.57 - 1.00 mg/dL 9.52  8.41  3.24   Sodium 134 - 144 mmol/L 146  145  141   Potassium 3.5 - 5.2 mmol/L 4.4  4.5  4.5   Chloride 96 - 106 mmol/L  107  106  103   CO2 20 - 29 mmol/L 23  26    Calcium 8.7 - 10.3 mg/dL 9.4  9.4        Latest Ref Rng & Units 03/28/2022    8:11 AM 01/19/2022    5:28 PM 01/19/2022    5:15 PM  CBC  WBC 3.4 - 10.8 x10E3/uL 8.1   6.9   Hemoglobin 11.1 - 15.9 g/dL 40.1  02.7  25.3   Hematocrit 34.0 - 46.6 % 40.7  38.0  38.1   Platelets 150 - 450 x10E3/uL 238   220    HEMOGLOBIN A1C Lab Results  Component Value Date   HGBA1C 6.6 (H) 08/04/2021   MPG 142.72 08/04/2021   TSH Recent Labs    08/17/21 1500  TSH 3.256   External labs:  Labs 08/23/2021:  Hb 9.3/HCT 28.7, platelets 307.  Normal indicis.  BUN 45, creatinine 1.51, EGFR 37 mL, potassium 4.7, LFTs normal.  A1c 7.6%.  TSH normal at 3.97.  Vitamin D 55.7.  Labs 04/12/2021:  Total cholesterol 121, triglycerides 145, HDL 40, LDL 52.  Radiology:   CT chest and abdomen 01/19/2022: Cardiovascular: There is no cardiomegaly or pericardial effusion. Advanced coronary vascular calcification of the LAD.  Mild atherosclerotic calcification of the thoracic aorta. No aneurysmal dilatation or dissection.  The central pulmonary arteries appear unremarkable.  Cardiac Studies:  PCV MYOCARDIAL PERFUSION WO LEXISCAN 09/12/2021  Narrative Exercise nuclear stress test 09/12/21 Myocardial perfusion is abnormal. Overall LV systolic function is normal without regional wall motion abnormalities. Intermediate risk study. There is a reversible mild defect in the inferior and apical regions. Stress LV EF: 51%. Abnormal ECG stress. The patient exercised for 6 minutes and 41 seconds of a Modified Bruce protocol, achieving approximately 8.09 METs and 89% MPHR. The heart rate response was normal. The blood pressure response was normal. No previous exam available for comparison.   PCV ECHOCARDIOGRAM COMPLETE 10/05/2021  Narrative Echocardiogram 10/05/2021: Left ventricle cavity is normal in size and wall thickness. Normal global wall motion. Normal LV  systolic function with EF 61%. Normal diastolic filling pattern. No significant valvular abnormality. Normal right atrial pressure.  Conclusion  Left Heart Catheterization 04/05/22:  LV: 137/3, EDP 20 mmHg.  Ao 01/29/2027/68, mean 96 mmHg.  No pressure gradient across the aortic valve.  LA 7 mmHg.   RCA: Normal right coronary artery. LM: Large-caliber vessel, smooth and normal. LCx: Large-caliber vessel, mid segment has a 30% stenosis, gives origin to a moderate-sized OM 2 but otherwise small OM 1 and 3. LAD: Severely calcified all the way from the proximal to mid segment.  Gives origin to a moderate to large size diagonal 1.  D1 in the proximal segment has a calcific 90% stenosis.  LAD has tandem severe calcific 80% stenosis.  In some views appear to be 40 to 50%. RFR: LAD RFR performed, at rest 0.87, 30 seconds after 200 mcg IC nitroglycerin RFR was 0.80 suggestive of physiologically significant LAD stenosis.    Recommendation: In view of heavy calcification, she may benefit from CABG to LAD and D1.  I will discuss with heart team to see whether she would benefit from focal stenting in the tandem 80% stenosis of the LAD and leaving diffuse disease alone.    EKG:   EKG 08/23/2021: Normal sinus rhythm at the rate of 78 bpm, poor R wave progression, probably normal variant.  No evidence of ischemia otherwise normal EKG.    EKG 08/17/2021: Sinus tachycardia at rate of 143 bpm.  Otherwise normal EKG.  Assessment     ICD-10-CM   1. Coronary artery disease due to calcified coronary lesion  I25.10 Ambulatory referral to [facility]   I25.84     2. Type 2 diabetes mellitus with stage 3a chronic kidney disease, with long-term current use of insulin  E11.22 Ambulatory referral to [facility]   N18.31    Z79.4     3. Class 2 severe obesity due to excess calories with serious comorbidity and body mass index (BMI) of 37.0 to 37.9 in adult  E66.01 Ambulatory  referral to [facility]   Z68.37        Orders Placed This Encounter  Procedures   Ambulatory referral to [facility]    Referral Priority:   Routine    Referral Type:   Consultation    Referral Reason:   Specialty Services Required    Number of Visits Requested:   1    No orders of the defined types were placed in this encounter.   Medications Discontinued During This Encounter  Medication Reason   Semaglutide (RYBELSUS) 3 MG TABS Discontinued by provider   Recommendations:   ROYALTY FAKHOURI is a 62 y.o.  Patient with hypertension, hyperlipidemia, diabetes mellitus, decreased exercise tolerance, underwent cardiac catheterization on 04/05/2022 revealing calcific high-grade stenosis  in D1 and also at least a 70 to 80% stenosis in the mid LAD.  After heart team discussions, it was recommended that we continue medical therapy and to proceed with PCI only if she became symptomatic with angina.  Secondary prevention was recommended.  Patient had done well and was losing weight on Ozempic but admitted with dehydration and acute renal failure.  I had started her on Rybelsus, she now presents for follow-up.  1. Coronary artery disease due to calcified coronary lesion Patient concerned about coronary disease and wondering whether her fatigue, dyspnea could be related to coronary disease.  After discussions, patient is markedly sedentary, has been gaining weight, has had no exercise and advised her that in the presence of calcified lesion, unstable angina pectoris is unlikely to happen especially if you are medically treated.  Only way of knowing whether she will be helped by angioplasty would be to increase her physical activity and to make lifestyle changes and lose some weight. I will make referral [facility].  No current indication for exercise from cardiac standpoint. - Ambulatory referral to [facility]  2. Type 2 diabetes mellitus with stage 3a chronic kidney disease, with long-term current use of insulin No change in her weight, as she is only on very small dose of Rybelsus, I have discontinued this.  She will discuss with Dr. Talmage Nap regarding trying to restart GLP-1 agonist.  In the interim hopefully referral to medical fitness center will help with improving her overall wellness and exercise capacity.  - Ambulatory referral to [facility]  3. Class 2 severe obesity due to excess calories with serious comorbidity and body mass index (BMI) of 37.0 to 37.9 in adult  - Ambulatory referral to [facility].  I will see her back in 3 months for follow-up.     Yates Decamp, MD, [facility] 05/17/2022, 8:29 PM Office: [phone]

## 2022-06-10 ENCOUNTER — Other Ambulatory Visit: Payer: Self-pay | Admitting: Cardiology

## 2022-06-10 DIAGNOSIS — R000 Tachycardia, unspecified: Secondary | ICD-10-CM

## 2022-06-10 DIAGNOSIS — I25118 Atherosclerotic heart disease of native coronary artery with other forms of angina pectoris: Secondary | ICD-10-CM

## 2022-06-13 ENCOUNTER — Encounter: Payer: Self-pay | Admitting: Cardiology

## 2022-06-13 NOTE — Telephone Encounter (Signed)
From patient.

## 2022-07-09 ENCOUNTER — Other Ambulatory Visit: Payer: Self-pay | Admitting: Cardiology

## 2022-07-09 DIAGNOSIS — I2510 Atherosclerotic heart disease of native coronary artery without angina pectoris: Secondary | ICD-10-CM

## 2022-07-12 NOTE — Telephone Encounter (Signed)
From patient.

## 2022-07-16 ENCOUNTER — Other Ambulatory Visit: Payer: Self-pay | Admitting: Cardiology

## 2022-07-16 DIAGNOSIS — E8779 Other fluid overload: Secondary | ICD-10-CM

## 2022-07-18 HISTORY — PX: ARTERIAL BYPASS SURGRY: SHX557

## 2022-07-26 DIAGNOSIS — Z951 Presence of aortocoronary bypass graft: Secondary | ICD-10-CM

## 2022-07-26 HISTORY — DX: Presence of aortocoronary bypass graft: Z95.1

## 2022-08-13 ENCOUNTER — Ambulatory Visit: Payer: 59 | Admitting: Cardiology

## 2022-08-14 ENCOUNTER — Encounter: Payer: Self-pay | Admitting: Cardiology

## 2022-08-14 ENCOUNTER — Ambulatory Visit: Payer: 59 | Admitting: Cardiology

## 2022-08-14 VITALS — BP 103/66 | HR 93 | Resp 16 | Ht 63.0 in | Wt 205.2 lb

## 2022-08-14 DIAGNOSIS — I2510 Atherosclerotic heart disease of native coronary artery without angina pectoris: Secondary | ICD-10-CM

## 2022-08-14 DIAGNOSIS — Z951 Presence of aortocoronary bypass graft: Secondary | ICD-10-CM

## 2022-08-14 DIAGNOSIS — I10 Essential (primary) hypertension: Secondary | ICD-10-CM

## 2022-08-14 MED ORDER — CLOPIDOGREL BISULFATE 75 MG PO TABS
75.0000 mg | ORAL_TABLET | Freq: Every day | ORAL | 1 refills | Status: DC
Start: 2022-08-14 — End: 2023-02-05

## 2022-08-14 NOTE — Progress Notes (Signed)
Primary Physician/Referring:  Irena Reichmann, DO  Patient ID: Kayla Berger, female    DOB: [birth year], 62 y.o.   MRN: [id number]  Chief Complaint  Patient presents with   Hospitalization Follow-up   HPI:    Kayla Berger  is a 62 y.o. Patient with hypertension, hyperlipidemia, diabetes mellitus, decreased exercise tolerance, underwent cardiac catheterization on 04/05/2022 revealing calcific high-grade stenosis in D1 and also at least a 70 to 80% stenosis in the mid LAD.  After heart team discussions, it was recommended that we continue medical therapy and to proceed with PCI only if she became symptomatic with angina.  Secondary prevention was recommended.  She underwent CABG at [facility] on 07/26/2022 to LAD and D1, exact graft placement not known at this time.  States that she has noticed improvement in dyspnea, mild upper back pain that she thought was related to chronic back issues has improved as well.  She is accompanied by her sister who is a physician. Dyspnea has remained stable.  No PND or orthopnea.  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   Migraines    occ   PCOS (polycystic ovarian syndrome) 1986   PONV (postoperative nausea and vomiting)    Renal disorder    Shoulder pain    Thyroid disease    Trochanteric bursitis of left hip    Vaginal cysts    4 have been removed   Past Surgical History:  Procedure Laterality Date   APPENDECTOMY  1967   ARTERIAL BYPASS SURGRY  07/18/2022   CARDIAC CATHETERIZATION Bilateral    cataracts   CHOLECYSTECTOMY N/A 02/16/2021   Procedure: LAPAROSCOPIC CHOLECYSTECTOMY;  Surgeon: [name], MD;  Location: [facility] OR;  Service:  General;  Laterality: N/A;   CORONARY PRESSURE/FFR STUDY N/A 04/05/2022   Procedure: INTRAVASCULAR PRESSURE WIRE/FFR STUDY;  Surgeon: Yates Decamp, MD;  Location: [facility] INVASIVE CV LAB;  Service: Cardiovascular;  Laterality: N/A;   KYPHOPLASTY N/A 10/31/2016   Procedure: KYPHOPLASTY LUMBAR ONE, LUMBAR TWO AND  LUMBAR THREE;  Surgeon: Venita Lick, MD;  Location: [facility] OR;  Service: Orthopedics;  Laterality: N/A;   LEFT HEART CATH AND CORONARY ANGIOGRAPHY N/A 04/05/2022   Procedure: LEFT HEART CATH AND CORONARY ANGIOGRAPHY;  Surgeon: Yates Decamp, MD;  Location: [facility] INVASIVE CV LAB;  Service: Cardiovascular;  Laterality: N/A;   nsvd  2001   normal vaginal del   sonobysterogram, laporoscopy  1997   fertility test   Social History   Tobacco Use   Smoking status: Never   Smokeless tobacco: Never  Substance Use Topics   Alcohol use: No   Marital Status: Divorced  ROS  Review of Systems  Constitutional: Positive for weight gain.  Cardiovascular:  Negative for chest pain, dyspnea on exertion and leg swelling.   Objective  Blood pressure 103/66, pulse 93, resp. rate 16, height 5\' 3"  (1.6 m), weight 205 lb 3.2 oz (93.1 kg), last menstrual period 10/06/2012, SpO2 96%. Body mass index is 36.35 kg/m.     08/14/2022   11:12 AM 05/14/2022    1:50 PM 04/18/2022    1:03 PM  Vitals with BMI  Height 5\' 3"  5\' 3"  5' 3.5"  Weight 205 lbs 3 oz 211 lbs 13 oz 207 lbs  BMI 36.36 37.53 36.09  Systolic 103 126 914  Diastolic 66 72 79  Pulse 93 89 80    Physical Exam Constitutional:      Appearance: She is obese.  Neck:     Vascular: No JVD.  Cardiovascular:     Rate and Rhythm: Normal rate and regular rhythm.     Pulses: Intact distal pulses.     Heart sounds: Normal heart sounds. No murmur heard.    No gallop.  Pulmonary:     Effort: Pulmonary effort is normal.     Breath sounds: Normal breath sounds.  Abdominal:     General: Bowel sounds are normal.     Palpations: Abdomen is soft.   Musculoskeletal:     Right lower leg: No edema.     Left lower leg: No edema.    Medications and allergies   Allergies  Allergen Reactions   Hydrocodone Itching, Swelling and Other (See Comments)    Numbness of the face/hands and tingling also     Medication list after today's encounter   Current Outpatient Medications:    Alpha Lipoic Acid 200 MG CAPS, Take 200 mg by mouth at bedtime., Disp: , Rfl:    amiodarone (PACERONE) 200 MG tablet, Take 200 mg by mouth daily., Disp: , Rfl:    aspirin 81 MG chewable tablet, Chew 81 mg by mouth in the morning., Disp: , Rfl:    atorvastatin (LIPITOR) 10 MG tablet, Take 10 mg by mouth at bedtime., Disp: , Rfl:    Biotin 78295 MCG TABS, Take 10,000 mcg by mouth every evening., Disp: , Rfl:    cetirizine (ZYRTEC) 10 MG tablet, Take 10 mg by mouth in the morning., Disp: , Rfl:    Cholecalciferol 25 MCG (1000 UT) capsule, Take 1,000 Units by mouth 2 (two) times daily., Disp: , Rfl:    clopidogrel (PLAVIX) 75 MG tablet, Take 1 tablet (75 mg total) by mouth daily., Disp: 90 tablet, Rfl: 1   CRANBERRY PO, Take 15,000 mg by mouth in the morning., Disp: , Rfl:    DULoxetine (CYMBALTA) 60 MG capsule, Take 60 mg by mouth 2 (two) times daily., Disp: , Rfl:    EXCEDRIN MIGRAINE 250-250-65 MG tablet, Take 2 tablets by mouth every 6 (six) hours as needed for headache or migraine., Disp: , Rfl:    furosemide (LASIX) 20 MG tablet, TAKE 1 TABLET BY MOUTH ONCE DAILY AS NEEDED FOR  FLUID, Disp: 30 tablet, Rfl: 0   gabapentin (NEURONTIN) 800 MG tablet, Take 400 mg by mouth at bedtime., Disp: , Rfl:    HUMALOG KWIKPEN 100 UNIT/ML KwikPen, Inject 5-12 Units into the skin 3 (three) times daily as needed (for a BGL greater than 100)., Disp: , Rfl:    insulin glargine, 2 Unit Dial, (TOUJEO MAX SOLOSTAR) 300 UNIT/ML Solostar Pen, Inject 10 Units into the skin at bedtime. (Patient taking differently: Inject 58 Units into the skin at bedtime.), Disp: , Rfl:    JARDIANCE 10 MG  TABS tablet, Take 10 mg by mouth in the morning., Disp: , Rfl:    metoprolol tartrate (LOPRESSOR) 25 MG tablet, Take 1 tablet by mouth twice daily, Disp: 180 tablet, Rfl: 0   Multiple Vitamins-Minerals (CENTRUM WOMEN) TABS, Take 1 tablet by mouth daily with breakfast., Disp: , Rfl:    Nutritional Supplements (EQUATE PO), Take by  mouth., Disp: , Rfl:    pantoprazole (PROTONIX) 40 MG tablet, Take 1 tablet (40 mg total) by mouth 2 (two) times daily. (Patient taking differently: Take 40 mg by mouth daily.), Disp: 60 tablet, Rfl: 0   polyethylene glycol powder (GLYCOLAX/MIRALAX) 17 GM/SCOOP powder, Take 1 Container by mouth once., Disp: , Rfl:    potassium chloride (KLOR-CON) 10 MEQ tablet, TAKE 1 TABLET BY MOUTH ONCE DAILY AS NEEDED WITH FUROSEMIDE ONLY, Disp: 30 tablet, Rfl: 0   zinc gluconate 50 MG tablet, Take 50 mg by mouth in the morning., Disp: , Rfl:   Laboratory examination:   Lab Results  Component Value Date   NA 146 (H) 05/09/2022   K 4.4 05/09/2022   CO2 23 05/09/2022   GLUCOSE 76 05/09/2022   BUN 24 05/09/2022   CREATININE 1.09 (H) 05/09/2022   CALCIUM 9.4 05/09/2022   EGFR 58 (L) 05/09/2022   GFRNONAA 50 (L) 01/19/2022       Latest Ref Rng & Units 05/09/2022    8:11 AM 03/28/2022    8:11 AM 01/19/2022    5:28 PM  CMP  Glucose 70 - 99 mg/dL 76  409  811   BUN 8 - 27 mg/dL 24  23  31    Creatinine 0.57 - 1.00 mg/dL 9.14  7.82  9.56   Sodium 134 - 144 mmol/L 146  145  141   Potassium 3.5 - 5.2 mmol/L 4.4  4.5  4.5   Chloride 96 - 106 mmol/L 107  106  103   CO2 20 - 29 mmol/L 23  26    Calcium 8.7 - 10.3 mg/dL 9.4  9.4        Latest Ref Rng & Units 03/28/2022    8:11 AM 01/19/2022    5:28 PM 01/19/2022    5:15 PM  CBC  WBC 3.4 - 10.8 x10E3/uL 8.1   6.9   Hemoglobin 11.1 - 15.9 g/dL 21.3  08.6  57.8   Hematocrit 34.0 - 46.6 % 40.7  38.0  38.1   Platelets 150 - 450 x10E3/uL 238   220    HEMOGLOBIN A1C Lab Results  Component Value Date   HGBA1C 6.6 (H) 08/04/2021    MPG 142.72 08/04/2021   TSH Recent Labs    08/17/21 1500  TSH 3.256   External labs:  Labs 08/23/2021:  Hb 9.3/HCT 28.7, platelets 307.  Normal indicis.  BUN 45, creatinine 1.51, EGFR 37 mL, potassium 4.7, LFTs normal.  A1c 7.6%.  TSH normal at 3.97.  Vitamin D 55.7.  Labs 04/12/2021:  Total cholesterol 121, triglycerides 145, HDL 40, LDL 52.  Radiology:   CT chest and abdomen 01/19/2022: Cardiovascular: There is no cardiomegaly or pericardial effusion. Advanced coronary vascular calcification of the LAD.  Mild atherosclerotic calcification of the thoracic aorta. No aneurysmal dilatation or dissection.  The central pulmonary arteries appear unremarkable.  Cardiac Studies:   PCV MYOCARDIAL PERFUSION WO LEXISCAN 09/12/2021  Narrative Exercise nuclear stress test 09/12/21 Myocardial perfusion is abnormal. Overall LV systolic function is normal without regional wall motion abnormalities. Intermediate risk study. There is a reversible mild defect in the inferior and apical regions. Stress LV EF: 51%. Abnormal ECG stress. The patient exercised for 6 minutes and 41 seconds of a Modified Bruce protocol, achieving approximately 8.09 METs and 89% MPHR. The heart rate response was normal. The blood pressure response was normal. No previous exam available for comparison.   Left Heart Catheterization 04/05/22:  LV: 137/3, EDP 20 mmHg.  Ao 01/29/2027/68, mean 96 mmHg.  No pressure gradient across the aortic valve.  LA 7 mmHg.   RCA: Normal right coronary artery. LM: Large-caliber vessel, smooth and normal. LCx: Large-caliber vessel, mid segment has a 30% stenosis, gives origin to a moderate-sized OM 2 but otherwise small OM 1 and 3. LAD: Severely calcified all the way from the proximal to mid segment.  Gives origin to a moderate to large size diagonal 1.  D1 in the proximal segment has a calcific 90% stenosis.  LAD has tandem severe calcific 80% stenosis.  In some views appear to be 40  to 50%. RFR: LAD RFR performed, at rest 0.87, 30 seconds after 200 mcg IC nitroglycerin RFR was 0.80 suggestive of physiologically significant LAD stenosis.    Recommendation: In view of heavy calcification, she may benefit from CABG to LAD and D1.       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   Echocardiogram 07/24/2021: The left ventricular size is normal with normal left ventricular wall thickness. LV ejection  fraction = 60-65%.  The right ventricle is mildly dilated. The right ventricular systolic function is mildly reduced.  The left atrial size is normal.  Right atrial size is normal.  There is aortic valve sclerosis.  There is mild to moderate tricuspid regurgitation.  Estimated right ventricular systolic pressure is 38 mmHg  mildly elevated  Mildly dilated, non-collapsible IVC consistent with an elevated right atrial pressure.  There is no pericardial effusion.    EKG:   EKG 08/14/2022: Normal sinus rhythm at rate of 84 bpm, normal axis, nonspecific T abnormality.  Low-voltage complexes.  Compared to 08/23/2021, no change.  Assessment     ICD-10-CM   1. Coronary artery disease due to calcified coronary lesion  I25.10 EKG 12-Lead   I25.84 clopidogrel (PLAVIX) 75 MG tablet    2. 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  Z95.1 clopidogrel (PLAVIX) 75 MG tablet    3. Primary hypertension  I10     4. Class 2 severe obesity due to excess calories with serious comorbidity and body mass index (BMI) of 36.0 to 36.9 in adult [facility])  E66.01    Z68.36        Orders Placed This Encounter  Procedures   EKG 12-Lead    Meds ordered this encounter  Medications   clopidogrel (PLAVIX) 75 MG tablet    Sig: Take 1 tablet (75 mg total) by mouth daily.    Dispense:  90 tablet    Refill:  1    Medications Discontinued During This Encounter  Medication Reason   APPLE CIDER VINEGAR PO    ascorbic acid (VITAMIN C) 500 MG  tablet    diltiazem (CARDIZEM CD) 180 MG 24 hr capsule    isosorbide mononitrate (IMDUR) 30 MG 24 hr tablet    Teriparatide, Recombinant, 620 MCG/2.48ML SOPN    Recommendations:   Kayla Berger is a 62 y.o.  Patient with hypertension, hyperlipidemia, diabetes mellitus, decreased exercise tolerance, underwent cardiac catheterization on 04/05/2022 revealing calcific high-grade stenosis in D1 and also at least a 70 to 80% stenosis in the mid LAD.  After heart team discussions, it was recommended that we continue medical therapy and to proceed with PCI only if she became symptomatic with angina.  Secondary prevention was recommended.   1. Coronary artery disease due to calcified coronary lesion Patient  obtained secondary opinion at [facility] and ended up having bypass surgery.  States that she has noticed improvement in dyspnea and also tightness in the back with exertion is now resolved.  She has not had any significant postoperative complications. - EKG 12-Lead - EKG 12-Lead - clopidogrel (PLAVIX) 75 MG tablet; Take 1 tablet (75 mg total) by mouth daily.  Dispense: 90 tablet; Refill: 1  2. 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 She was started on amiodarone for post bypass surgery for brief AF, she was also noted to have mild RV systolic dysfunction and RV dilatation and was hypotensive for a while which is now resolved.  Will repeat echocardiogram probably in 6 months for now we will continue observation as there is no clinical evidence of heart failure.  I have started her on Plavix along with aspirin 81 mg daily for at least 6 months since his CABG.    I reviewed her chart extensively, discussed with her sister who is a physician at the bedside that I could not make out exactly grafts as I could not decipher what exactly was done.  They will further clarify this with the surgeon to update my charts.  She is maintaining sinus rhythm.  She has an appointment to see the  surgeon next week, advised her that she could discontinue the amiodarone after discussions with her surgeon. I will also update her medical history once I find out the exact grafts. - clopidogrel (PLAVIX) 75 MG tablet; Take 1 tablet (75 mg total) by mouth daily.  Dispense: 90 tablet; Refill: 1  3. Primary hypertension Blood pressure is well-controlled.  I reviewed Labs, renal function is normal.  4. Class 2 severe obesity due to excess calories with serious comorbidity and body mass index (BMI) of 36.0 to 36.9 in adult [facility]) Obesity continues to be a major issue, she was started on Rybelsus which was held during surgery, advised her that she could certainly resume this.  She has close appointment and follow-up with Dr. Talmage Nap her endocrinologist.  I would like to see her back in 6 weeks for follow-up.  This was a 40-minute office visit encounter.   Other orders - polyethylene glycol powder (GLYCOLAX/MIRALAX) 17 GM/SCOOP powder; Take 1 Container by mouth once. - Nutritional Supplements (EQUATE PO); Take by mouth. - amiodarone (PACERONE) 200 MG tablet; Take 200 mg by mouth daily.     Yates Decamp, MD, [facility] 08/15/2022, 6:33 AM Office: [phone]

## 2022-08-23 ENCOUNTER — Telehealth (HOSPITAL_COMMUNITY): Payer: Self-pay

## 2022-08-23 ENCOUNTER — Other Ambulatory Visit (HOSPITAL_COMMUNITY): Payer: Self-pay | Admitting: *Deleted

## 2022-08-23 DIAGNOSIS — Z951 Presence of aortocoronary bypass graft: Secondary | ICD-10-CM

## 2022-08-23 NOTE — Telephone Encounter (Signed)
Pt called and wants to do cardiac rehab here at the [facility] location

## 2022-08-28 ENCOUNTER — Encounter (HOSPITAL_COMMUNITY): Payer: Self-pay

## 2022-08-29 ENCOUNTER — Telehealth (HOSPITAL_COMMUNITY): Payer: Self-pay

## 2022-08-29 NOTE — Telephone Encounter (Signed)
Called to confirm appt for 09/02/22 at 800. No answer, LM on VM.   Jonna Coup, MS, ACSM-CEP 08/29/2022 3:55 PM

## 2022-09-02 ENCOUNTER — Encounter (HOSPITAL_COMMUNITY)
Admission: RE | Admit: 2022-09-02 | Discharge: 2022-09-02 | Disposition: A | Discharge status: Home / Self Care | Payer: 59 | Source: Ambulatory Visit | Attending: Cardiology | Admitting: Cardiology

## 2022-09-02 VITALS — BP 110/74 | HR 95 | Ht 63.0 in | Wt 200.6 lb

## 2022-09-02 DIAGNOSIS — Z951 Presence of aortocoronary bypass graft: Secondary | ICD-10-CM | POA: Insufficient documentation

## 2022-09-02 DIAGNOSIS — Z48812 Encounter for surgical aftercare following surgery on the circulatory system: Secondary | ICD-10-CM | POA: Insufficient documentation

## 2022-09-02 NOTE — Progress Notes (Signed)
Cardiac Individual Treatment Plan  Patient Details  Name: Kayla Berger MRN: [id number] Date of Birth: 1960/03/29 Referring Provider:   Flowsheet Row INTENSIVE CARDIAC REHAB ORIENT from 09/02/2022 in [facility]  Referring Provider Yates Decamp, MD       Initial Encounter Date:  Flowsheet Row INTENSIVE CARDIAC REHAB ORIENT from 09/02/2022 in [facility]  Date 09/02/22       Visit Diagnosis: S/P CABG x 2  Patient's Home Medications on Admission:  Current Outpatient Medications:    Alpha Lipoic Acid 200 MG CAPS, Take 200 mg by mouth at bedtime., Disp: , Rfl:    aspirin 81 MG chewable tablet, Chew 81 mg by mouth in the morning., Disp: , Rfl:    atorvastatin (LIPITOR) 10 MG tablet, Take 10 mg by mouth at bedtime., Disp: , Rfl:    Biotin 52841 MCG TABS, Take 10,000 mcg by mouth every evening., Disp: , Rfl:    cetirizine (ZYRTEC) 10 MG tablet, Take 10 mg by mouth in the morning., Disp: , Rfl:    clopidogrel (PLAVIX) 75 MG tablet, Take 1 tablet (75 mg total) by mouth daily., Disp: 90 tablet, Rfl: 1   CRANBERRY PO, Take 15,000 mg by mouth in the morning., Disp: , Rfl:    DULoxetine (CYMBALTA) 60 MG capsule, Take 60 mg by mouth 2 (two) times daily., Disp: , Rfl:    EXCEDRIN MIGRAINE 250-250-65 MG tablet, Take 2 tablets by mouth every 6 (six) hours as needed for headache or migraine., Disp: , Rfl:    furosemide (LASIX) 20 MG tablet, TAKE 1 TABLET BY MOUTH ONCE DAILY AS NEEDED FOR  FLUID, Disp: 30 tablet, Rfl: 0   gabapentin (NEURONTIN) 800 MG tablet, Take 400 mg by mouth at bedtime., Disp: , Rfl:    HUMALOG KWIKPEN 100 UNIT/ML KwikPen, Inject 5-12 Units into the skin 3 (three) times daily as needed (for a BGL greater than 100)., Disp: , Rfl:    insulin glargine, 2 Unit Dial, (TOUJEO MAX SOLOSTAR) 300 UNIT/ML Solostar Pen, Inject 10 Units into the skin at bedtime. (Patient taking  differently: Inject 58 Units into the skin at bedtime.), Disp: , Rfl:    JARDIANCE 10 MG TABS tablet, Take 10 mg by mouth in the morning., Disp: , Rfl:    metoprolol tartrate (LOPRESSOR) 25 MG tablet, Take 1 tablet by mouth twice daily, Disp: 180 tablet, Rfl: 0   Multiple Vitamins-Minerals (CENTRUM WOMEN) TABS, Take 1 tablet by mouth daily with breakfast., Disp: , Rfl:    Nutritional Supplements (EQUATE PO), Take by mouth., Disp: , Rfl:    oxyCODONE (OXY IR/ROXICODONE) 5 MG immediate release tablet, Take 5 mg by mouth every 4 (four) hours as needed for moderate pain or severe pain., Disp: , Rfl:    pantoprazole (PROTONIX) 40 MG tablet, Take 1 tablet (40 mg total) by mouth 2 (two) times daily. (Patient taking differently: Take 40 mg by mouth daily.), Disp: 60 tablet, Rfl: 0   polyethylene glycol powder (GLYCOLAX/MIRALAX) 17 GM/SCOOP powder, Take 1 Container by mouth once., Disp: , Rfl:    potassium chloride (KLOR-CON) 10 MEQ tablet, TAKE 1 TABLET BY MOUTH ONCE DAILY AS NEEDED WITH FUROSEMIDE ONLY, Disp: 30 tablet, Rfl: 0   RYBELSUS 3 MG TABS, Take 1 tablet by mouth daily., Disp: , Rfl:    zinc gluconate 50 MG tablet, Take 50 mg by mouth in the morning., Disp: ,  Rfl:    amiodarone (PACERONE) 200 MG tablet, Take 200 mg by mouth daily. (Patient not taking: Reported on 09/02/2022), Disp: , Rfl:    Cholecalciferol 25 MCG (1000 UT) capsule, Take 1,000 Units by mouth 2 (two) times daily. 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   Migraines    occ   PCOS (polycystic ovarian syndrome) 1986   PONV (postoperative nausea and  vomiting)    Renal disorder    Shoulder pain    Thyroid disease    Trochanteric bursitis of left hip    Vaginal cysts    4 have been removed    Tobacco Use: Social History   Tobacco Use  Smoking Status Never  Smokeless Tobacco Never    Labs: Review Flowsheet  More data exists      Latest Ref Rng & Units 10/31/2016 02/13/2021 08/02/2021 08/04/2021 01/19/2022  Labs for ITP Cardiac and Pulmonary Rehab  Hemoglobin A1c 4.8 - 5.6 % 12.3  7.1  - 6.6  -  Bicarbonate 20.0 - 28.0 mmol/L - - 44.2  42.1  - -  TCO2 22 - 32 mmol/L - - 44  - 28   O2 Saturation % - - 55.1  91  - -    Details       Multiple values from one day are sorted in reverse-chronological order         Capillary Blood Glucose: Lab Results  Component Value Date   GLUCAP 122 (H) 04/05/2022   GLUCAP 60 (L) 04/05/2022   GLUCAP 94 04/05/2022   GLUCAP 265 (H) 08/08/2021   GLUCAP 176 (H) 08/08/2021     Exercise Target Goals: Exercise Program Goal: Individual exercise prescription set using results from initial 6 min walk test and THRR while considering  patient's activity barriers and safety.   Exercise Prescription Goal: Initial exercise prescription builds to 30-45 minutes a day of aerobic activity, 2-3 days per week.  Home exercise guidelines will be given to patient during program as part of exercise prescription that the participant will acknowledge.  Activity Barriers & Risk Stratification:  Activity Barriers & Cardiac Risk Stratification - 09/02/22 1206       Activity Barriers & Cardiac Risk Stratification   Activity Barriers Back Problems;Arthritis;Neck/Spine Problems;History of Falls;Balance Concerns;Deconditioning;Joint Problems    Cardiac Risk Stratification High   <5 METs on            6 Minute Walk:  6 Minute Walk     Row Name 09/02/22 1205         6 Minute Walk   Phase Initial     Distance 1680 feet     Walk Time 6 minutes     # of Rest Breaks 0     MPH 3.18     METS 3.82      RPE 13     Perceived Dyspnea  0     VO2 Peak 13.39     Symptoms Yes (comment)     Comments 6/10 Bilateral hip pain  Resting HR 92 bpm     Resting BP 110/74     Resting Oxygen Saturation  96 %     Exercise Oxygen Saturation  during 6 min walk 98 %     Max Ex. HR 114 bpm     Max Ex. BP 146/82     2 Minute Post BP 124/82              Oxygen Initial Assessment:   Oxygen Re-Evaluation:   Oxygen Discharge (Final Oxygen Re-Evaluation):   Initial Exercise Prescription:  Initial Exercise Prescription - 09/02/22 1200       Date of Initial Exercise RX and Referring Provider   Date 09/02/22    Referring Provider Yates Decamp, MD    Expected Discharge Date 11/27/22      Recumbant Bike   Level 1    RPM 55    Watts 30    Minutes 15    METs 2.5      NuStep   Level 1    SPM 60    Minutes 15    METs 2      Prescription Details   Frequency (times per week) 3    Duration Progress to 30 minutes of continuous aerobic without signs/symptoms of physical distress      Intensity   THRR 40-80% of Max Heartrate 64-127    Ratings of Perceived Exertion 11-13    Perceived Dyspnea 0-4      Progression   Progression Continue progressive overload as per policy without signs/symptoms or physical distress.      Resistance Training   Training Prescription Yes    Weight 2    Reps 10-15             Perform Capillary Blood Glucose checks as needed.  Exercise Prescription Changes:   Exercise Comments:   Exercise Goals and Review:   Exercise Goals     Row Name 09/02/22 0804             Exercise Goals   Increase Physical Activity Yes       Intervention Provide advice, education, support and counseling about physical activity/exercise needs.;Develop an individualized exercise prescription for aerobic and resistive training based on initial evaluation findings, risk stratification, comorbidities and participant's personal goals.       Expected Outcomes Short Term:  Attend rehab on a regular basis to increase amount of physical activity.;Long Term: Exercising regularly at least 3-5 days a week.;Long Term: Add in home exercise to make exercise part of routine and to increase amount of physical activity.       Increase Strength and Stamina Yes       Intervention Provide advice, education, support and counseling about physical activity/exercise needs.;Develop an individualized exercise prescription for aerobic and resistive training based on initial evaluation findings, risk stratification, comorbidities and participant's personal goals.       Expected Outcomes Short Term: Increase workloads from initial exercise prescription for resistance, speed, and METs.;Short Term: Perform resistance training exercises routinely during rehab and add in resistance training at home;Long Term: Improve cardiorespiratory fitness, muscular endurance and strength as measured by increased METs and functional capacity ( )       Able to understand and use rate of perceived exertion (RPE) scale Yes       Intervention Provide education and explanation on how to use RPE scale       Expected Outcomes Short Term: Able to use RPE daily in rehab to express subjective intensity  level;Long Term:  Able to use RPE to guide intensity level when exercising independently       Knowledge and understanding of Target Heart Rate Range (THRR) Yes       Intervention Provide education and explanation of THRR including how the numbers were predicted and where they are located for reference       Expected Outcomes Short Term: Able to state/look up THRR;Short Term: Able to use daily as guideline for intensity in rehab;Long Term: Able to use THRR to govern intensity when exercising independently       Understanding of Exercise Prescription Yes       Intervention Provide education, explanation, and written materials on patient's individual exercise prescription       Expected Outcomes Short Term: Able to explain  program exercise prescription;Long Term: Able to explain home exercise prescription to exercise independently                Exercise Goals Re-Evaluation :   Discharge Exercise Prescription (Final Exercise Prescription Changes):   Nutrition:  Target Goals: Understanding of nutrition guidelines, daily intake of sodium 1500mg , cholesterol 200mg , calories 30% from fat and 7% or less from saturated fats, daily to have 5 or more servings of fruits and vegetables.  Biometrics:  Pre Biometrics - 09/02/22 0829       Pre Biometrics   Waist Circumference 42.5 inches    Hip Circumference 49 inches    Waist to Hip Ratio 0.87 %    Triceps Skinfold 45 mm    % Body Fat 48.1 %    Grip Strength 22 kg    Flexibility 0 in   pt has chronic back issues, not done   Single Leg Stand 5 seconds              Nutrition Therapy Plan and Nutrition Goals:   Nutrition Assessments:  MEDIFICTS Score Key: ?70 Need to make dietary changes  40-70 Heart Healthy Diet ? 40 Therapeutic Level Cholesterol Diet    Picture Your Plate Scores: <16 Unhealthy dietary pattern with much room for improvement. 41-50 Dietary pattern unlikely to meet recommendations for good health and room for improvement. 51-60 More healthful dietary pattern, with some room for improvement.  >60 Healthy dietary pattern, although there may be some specific behaviors that could be improved.    Nutrition Goals Re-Evaluation:   Nutrition Goals Re-Evaluation:   Nutrition Goals Discharge (Final Nutrition Goals Re-Evaluation):   Psychosocial: Target Goals: Acknowledge presence or absence of significant depression and/or stress, maximize coping skills, provide positive support system. Participant is able to verbalize types and ability to use techniques and skills needed for reducing stress and depression.  Initial Review & Psychosocial Screening:  Initial Psych Review & Screening - 09/02/22 0931       Initial Review    Current issues with Current Anxiety/Panic;Current Psychotropic Meds;Current Stress Concerns;Current Depression    Source of Stress Concerns Chronic Illness;Financial    Comments Yajayra shared that she has felt some low levels of depression since her CAD diagnosis and post surgery. She has struggled to adjust to the lifestyle changes and learning what she can control vs. not control. She is taking Cymbalta and feels it is helping, does not feel she needs additional support. Jimi is also very unhappy with her physical appearance.      Family Dynamics   Good Support System? Yes   Mikayli has her sisters and daughter for support     Barriers   Psychosocial barriers  to participate in program The patient should benefit from training in stress management and relaxation.      Screening Interventions   Interventions Encouraged to exercise;Provide feedback about the scores to participant;To provide support and resources with identified psychosocial needs    Expected Outcomes Long Term Goal: Stressors or current issues are controlled or eliminated.;Short Term goal: Identification and review with participant of any Quality of Life or Depression concerns found by scoring the questionnaire.;Long Term goal: The participant improves quality of Life and PHQ9 Scores as seen by post scores and/or verbalization of changes             Quality of Life Scores:  Quality of Life - 09/02/22 1214       Quality of Life   Select Quality of Life      Quality of Life Scores   Health/Function Pre 18.67 %    Socioeconomic Pre 25 %    Psych/Spiritual Pre 18.5 %    Family Pre 24.5 %    GLOBAL Pre 20.79 %            Scores of 19 and below usually indicate a poorer quality of life in these areas.  A difference of  2-3 points is a clinically meaningful difference.  A difference of 2-3 points in the total score of the Quality of Life Index has been associated with significant improvement in overall quality of life,  self-image, physical symptoms, and general health in studies assessing change in quality of life.  PHQ-9: Review Flowsheet       09/02/2022  Depression screen PHQ 2/9  Decreased Interest 0  Down, Depressed, Hopeless 1  PHQ - 2 Score 1  Altered sleeping 1  Tired, decreased energy 1  Change in appetite 1  Feeling bad or failure about yourself  1  Trouble concentrating 0  Moving slowly or fidgety/restless 0  Suicidal thoughts 0  PHQ-9 Score 5  Difficult doing work/chores Somewhat difficult    Details           Interpretation of Total Score  Total Score Depression Severity:  1-4 = Minimal depression, 5-9 = Mild depression, 10-14 = Moderate depression, 15-19 = Moderately severe depression, 20-27 = Severe depression   Psychosocial Evaluation and Intervention:   Psychosocial Re-Evaluation:   Psychosocial Discharge (Final Psychosocial Re-Evaluation):   Vocational Rehabilitation: Provide vocational rehab assistance to qualifying candidates.   Vocational Rehab Evaluation & Intervention:  Vocational Rehab - 09/02/22 0840       Initial Vocational Rehab Evaluation & Intervention   Assessment shows need for Vocational Rehabilitation No   Jesselyn works for [employer]            Education: Education Goals: Education classes will be provided on a weekly basis, covering required topics. Participant will state understanding/return demonstration of topics presented.     Core Videos: Exercise    Move It!  Clinical staff conducted group or individual video education with verbal and written material and guidebook.  Patient learns the recommended Pritikin exercise program. Exercise with the goal of living a long, healthy life. Some of the health benefits of exercise include controlled diabetes, healthier blood pressure levels, improved cholesterol levels, improved heart and lung capacity, improved sleep, and better body composition. Everyone should speak with their  doctor before starting or changing an exercise routine.  Biomechanical Limitations Clinical staff conducted group or individual video education with verbal and written material and guidebook.  Patient learns how biomechanical limitations can impact  exercise and how we can mitigate and possibly overcome limitations to have an impactful and balanced exercise routine.  Body Composition Clinical staff conducted group or individual video education with verbal and written material and guidebook.  Patient learns that body composition (ratio of muscle mass to fat mass) is a key component to assessing overall fitness, rather than body weight alone. Increased fat mass, especially visceral belly fat, can put [place] at increased risk for metabolic syndrome, type 2 diabetes, heart disease, and even death. It is recommended to combine diet and exercise (cardiovascular and resistance training) to improve your body composition. Seek guidance from your physician and exercise physiologist before implementing an exercise routine.  Exercise Action Plan Clinical staff conducted group or individual video education with verbal and written material and guidebook.  Patient learns the recommended strategies to achieve and enjoy long-term exercise adherence, including variety, self-motivation, self-efficacy, and positive decision making. Benefits of exercise include fitness, good health, weight management, more energy, better sleep, less stress, and overall well-being.  Medical   Heart Disease Risk Reduction Clinical staff conducted group or individual video education with verbal and written material and guidebook.  Patient learns our heart is our most vital organ as it circulates oxygen, nutrients, white blood cells, and hormones throughout the entire body, and carries waste away. Data supports a plant-based eating plan like the Pritikin Program for its effectiveness in slowing progression of and reversing heart disease. The  video provides a number of recommendations to address heart disease.   Metabolic Syndrome and Belly Fat  Clinical staff conducted group or individual video education with verbal and written material and guidebook.  Patient learns what metabolic syndrome is, how it leads to heart disease, and how one can reverse it and keep it from coming back. You have metabolic syndrome if you have 3 of the following 5 criteria: abdominal obesity, high blood pressure, high triglycerides, low HDL cholesterol, and high blood sugar.  Hypertension and Heart Disease Clinical staff conducted group or individual video education with verbal and written material and guidebook.  Patient learns that high blood pressure, or hypertension, is very common in the [place]. Hypertension is largely due to excessive salt intake, but other important risk factors include being overweight, physical inactivity, drinking too much alcohol, smoking, and not eating enough potassium from fruits and vegetables. High blood pressure is a leading risk factor for heart attack, stroke, congestive heart failure, dementia, kidney failure, and premature death. Long-term effects of excessive salt intake include stiffening of the arteries and thickening of heart muscle and organ damage. Recommendations include ways to reduce hypertension and the risk of heart disease.  Diseases of Our Time - Focusing on Diabetes Clinical staff conducted group or individual video education with verbal and written material and guidebook.  Patient learns why the best way to stop diseases of our time is prevention, through food and other lifestyle changes. Medicine (such as prescription pills and surgeries) is often only a Band-Aid on the problem, not a long-term solution. Most common diseases of our time include obesity, type 2 diabetes, hypertension, heart disease, and cancer. The Pritikin Program is recommended and has been proven to help reduce, reverse, and/or prevent  the damaging effects of metabolic syndrome.  Nutrition   Overview of the Pritikin Eating Plan  Clinical staff conducted group or individual video education with verbal and written material and guidebook.  Patient learns about the Pritikin Eating Plan for disease risk reduction. The Pritikin Eating Plan emphasizes a wide  variety of unrefined, minimally-processed carbohydrates, like fruits, vegetables, whole grains, and legumes. Go, Caution, and Stop food choices are explained. Plant-based and lean animal proteins are emphasized. Rationale provided for low sodium intake for blood pressure control, low added sugars for blood sugar stabilization, and low added fats and oils for coronary artery disease risk reduction and weight management.  Calorie Density  Clinical staff conducted group or individual video education with verbal and written material and guidebook.  Patient learns about calorie density and how it impacts the Pritikin Eating Plan. Knowing the characteristics of the food you choose will help you decide whether those foods will lead to weight gain or weight loss, and whether you want to consume more or less of them. Weight loss is usually a side effect of the Pritikin Eating Plan because of its focus on low calorie-dense foods.  Label Reading  Clinical staff conducted group or individual video education with verbal and written material and guidebook.  Patient learns about the Pritikin recommended label reading guidelines and corresponding recommendations regarding calorie density, added sugars, sodium content, and whole grains.  Dining Out - Part 1  Clinical staff conducted group or individual video education with verbal and written material and guidebook.  Patient learns that restaurant meals can be sabotaging because they can be so high in calories, fat, sodium, and/or sugar. Patient learns recommended strategies on how to positively address this and avoid unhealthy pitfalls.  Facts on  Fats  Clinical staff conducted group or individual video education with verbal and written material and guidebook.  Patient learns that lifestyle modifications can be just as effective, if not more so, as many medications for lowering your risk of heart disease. A Pritikin lifestyle can help to reduce your risk of inflammation and atherosclerosis (cholesterol build-up, or plaque, in the artery walls). Lifestyle interventions such as dietary choices and physical activity address the cause of atherosclerosis. A review of the types of fats and their impact on blood cholesterol levels, along with dietary recommendations to reduce fat intake is also included.  Nutrition Action Plan  Clinical staff conducted group or individual video education with verbal and written material and guidebook.  Patient learns how to incorporate Pritikin recommendations into their lifestyle. Recommendations include planning and keeping personal health goals in mind as an important part of their success.  Healthy Mind-Set    Healthy Minds, Bodies, Hearts  Clinical staff conducted group or individual video education with verbal and written material and guidebook.  Patient learns how to identify when they are stressed. Video will discuss the impact of that stress, as well as the many benefits of stress management. Patient will also be introduced to stress management techniques. The way we think, act, and feel has an impact on our hearts.  How Our Thoughts Can Heal Our Hearts  Clinical staff conducted group or individual video education with verbal and written material and guidebook.  Patient learns that negative thoughts can cause depression and anxiety. This can result in negative lifestyle behavior and serious health problems. Cognitive behavioral therapy is an effective method to help control our thoughts in order to change and improve our emotional outlook.  Additional Videos:  Exercise    Improving Performance  Clinical  staff conducted group or individual video education with verbal and written material and guidebook.  Patient learns to use a non-linear approach by alternating intensity levels and lengths of time spent exercising to help burn more calories and lose more body fat. Cardiovascular exercise helps improve  heart health, metabolism, hormonal balance, blood sugar control, and recovery from fatigue. Resistance training improves strength, endurance, balance, coordination, reaction time, metabolism, and muscle mass. Flexibility exercise improves circulation, posture, and balance. Seek guidance from your physician and exercise physiologist before implementing an exercise routine and learn your capabilities and proper form for all exercise.  Introduction to Yoga  Clinical staff conducted group or individual video education with verbal and written material and guidebook.  Patient learns about yoga, a discipline of the coming together of mind, breath, and body. The benefits of yoga include improved flexibility, improved range of motion, better posture and core strength, increased lung function, weight loss, and positive self-image. Yoga's heart health benefits include lowered blood pressure, healthier heart rate, decreased cholesterol and triglyceride levels, improved immune function, and reduced stress. Seek guidance from your physician and exercise physiologist before implementing an exercise routine and learn your capabilities and proper form for all exercise.  Medical   Aging: Enhancing Your Quality of Life  Clinical staff conducted group or individual video education with verbal and written material and guidebook.  Patient learns key strategies and recommendations to stay in good physical health and enhance quality of life, such as prevention strategies, having an advocate, securing a Health Care Proxy and Power of Attorney, and keeping a list of medications and system for tracking them. It also discusses how to  avoid risk for bone loss.  Biology of Weight Control  Clinical staff conducted group or individual video education with verbal and written material and guidebook.  Patient learns that weight gain occurs because we consume more calories than we burn (eating more, moving less). Even if your body weight is normal, you may have higher ratios of fat compared to muscle mass. Too much body fat puts you at increased risk for cardiovascular disease, heart attack, stroke, type 2 diabetes, and obesity-related cancers. In addition to exercise, following the Pritikin Eating Plan can help reduce your risk.  Decoding Lab Results  Clinical staff conducted group or individual video education with verbal and written material and guidebook.  Patient learns that lab test reflects one measurement whose values change over time and are influenced by many factors, including medication, stress, sleep, exercise, food, hydration, pre-existing medical conditions, and more. It is recommended to use the knowledge from this video to become more involved with your lab results and evaluate your numbers to speak with your doctor.   Diseases of Our Time - Overview  Clinical staff conducted group or individual video education with verbal and written material and guidebook.  Patient learns that according to the CDC, 50% to 70% of chronic diseases (such as obesity, type 2 diabetes, elevated lipids, hypertension, and heart disease) are avoidable through lifestyle improvements including healthier food choices, listening to satiety cues, and increased physical activity.  Sleep Disorders Clinical staff conducted group or individual video education with verbal and written material and guidebook.  Patient learns how good quality and duration of sleep are important to overall health and well-being. Patient also learns about sleep disorders and how they impact health along with recommendations to address them, including discussing with a  physician.  Nutrition  Dining Out - Part 2 Clinical staff conducted group or individual video education with verbal and written material and guidebook.  Patient learns how to plan ahead and communicate in order to maximize their dining experience in a healthy and nutritious manner. Included are recommended food choices based on the type of restaurant the patient is visiting.  Fueling a Banker conducted group or individual video education with verbal and written material and guidebook.  There is a strong connection between our food choices and our health. Diseases like obesity and type 2 diabetes are very prevalent and are in large-part due to lifestyle choices. The Pritikin Eating Plan provides plenty of food and hunger-curbing satisfaction. It is easy to follow, affordable, and helps reduce health risks.  Menu Workshop  Clinical staff conducted group or individual video education with verbal and written material and guidebook.  Patient learns that restaurant meals can sabotage health goals because they are often packed with calories, fat, sodium, and sugar. Recommendations include strategies to plan ahead and to communicate with the manager, chef, or server to help order a healthier meal.  Planning Your Eating Strategy  Clinical staff conducted group or individual video education with verbal and written material and guidebook.  Patient learns about the Pritikin Eating Plan and its benefit of reducing the risk of disease. The Pritikin Eating Plan does not focus on calories. Instead, it emphasizes high-quality, nutrient-rich foods. By knowing the characteristics of the foods, we choose, we can determine their calorie density and make informed decisions.  Targeting Your Nutrition Priorities  Clinical staff conducted group or individual video education with verbal and written material and guidebook.  Patient learns that lifestyle habits have a tremendous impact on disease  risk and progression. This video provides eating and physical activity recommendations based on your personal health goals, such as reducing LDL cholesterol, losing weight, preventing or controlling type 2 diabetes, and reducing high blood pressure.  Vitamins and Minerals  Clinical staff conducted group or individual video education with verbal and written material and guidebook.  Patient learns different ways to obtain key vitamins and minerals, including through a recommended healthy diet. It is important to discuss all supplements you take with your doctor.   Healthy Mind-Set    Smoking Cessation  Clinical staff conducted group or individual video education with verbal and written material and guidebook.  Patient learns that cigarette smoking and tobacco addiction pose a serious health risk which affects millions of people. Stopping smoking will significantly reduce the risk of heart disease, lung disease, and many forms of cancer. Recommended strategies for quitting are covered, including working with your doctor to develop a successful plan.  Culinary   Becoming a Set designer conducted group or individual video education with verbal and written material and guidebook.  Patient learns that cooking at home can be healthy, cost-effective, quick, and puts them in control. Keys to cooking healthy recipes will include looking at your recipe, assessing your equipment needs, planning ahead, making it simple, choosing cost-effective seasonal ingredients, and limiting the use of added fats, salts, and sugars.  Cooking - Breakfast and Snacks  Clinical staff conducted group or individual video education with verbal and written material and guidebook.  Patient learns how important breakfast is to satiety and nutrition through the entire day. Recommendations include key foods to eat during breakfast to help stabilize blood sugar levels and to prevent overeating at meals later in the day.  Planning ahead is also a key component.  Cooking - Educational psychologist conducted group or individual video education with verbal and written material and guidebook.  Patient learns eating strategies to improve overall health, including an approach to cook more at home. Recommendations include thinking of animal protein as a side on your plate rather than center stage  and focusing instead on lower calorie dense options like vegetables, fruits, whole grains, and plant-based proteins, such as beans. Making sauces in large quantities to freeze for later and leaving the skin on your vegetables are also recommended to maximize your experience.  Cooking - Healthy Salads and Dressing Clinical staff conducted group or individual video education with verbal and written material and guidebook.  Patient learns that vegetables, fruits, whole grains, and legumes are the foundations of the Pritikin Eating Plan. Recommendations include how to incorporate each of these in flavorful and healthy salads, and how to create homemade salad dressings. Proper handling of ingredients is also covered. Cooking - Soups and [employer] - Soups and Desserts Clinical staff conducted group or individual video education with verbal and written material and guidebook.  Patient learns that Pritikin soups and desserts make for easy, nutritious, and delicious snacks and meal components that are low in sodium, fat, sugar, and calorie density, while high in vitamins, minerals, and filling fiber. Recommendations include simple and healthy ideas for soups and desserts.   Overview     The Pritikin Solution Program Overview Clinical staff conducted group or individual video education with verbal and written material and guidebook.  Patient learns that the results of the Pritikin Program have been documented in more than 100 articles published in peer-reviewed journals, and the benefits include reducing risk factors for  (and, in some cases, even reversing) high cholesterol, high blood pressure, type 2 diabetes, obesity, and more! An overview of the three key pillars of the Pritikin Program will be covered: eating well, doing regular exercise, and having a healthy mind-set.  WORKSHOPS  Exercise: Exercise Basics: Building Your Action Plan Clinical staff led group instruction and group discussion with PowerPoint presentation and patient guidebook. To enhance the learning environment the use of posters, models and videos may be added. At the conclusion of this workshop, patients will comprehend the difference between physical activity and exercise, as well as the benefits of incorporating both, into their routine. Patients will understand the FITT (Frequency, Intensity, Time, and Type) principle and how to use it to build an exercise action plan. In addition, safety concerns and other considerations for exercise and cardiac rehab will be addressed by the presenter. The purpose of this lesson is to promote a comprehensive and effective weekly exercise routine in order to improve patients' overall level of fitness.   Managing Heart Disease: Your Path to a Healthier Heart Clinical staff led group instruction and group discussion with PowerPoint presentation and patient guidebook. To enhance the learning environment the use of posters, models and videos may be added.At the conclusion of this workshop, patients will understand the anatomy and physiology of the heart. Additionally, they will understand how Pritikin's three pillars impact the risk factors, the progression, and the management of heart disease.  The purpose of this lesson is to provide a high-level overview of the heart, heart disease, and how the Pritikin lifestyle positively impacts risk factors.  Exercise Biomechanics Clinical staff led group instruction and group discussion with PowerPoint presentation and patient guidebook. To enhance the learning  environment the use of posters, models and videos may be added. Patients will learn how the structural parts of their bodies function and how these functions impact their daily activities, movement, and exercise. Patients will learn how to promote a neutral spine, learn how to manage pain, and identify ways to improve their physical movement in order to promote healthy living. The purpose of this lesson is  to expose patients to common physical limitations that impact physical activity. Participants will learn practical ways to adapt and manage aches and pains, and to minimize their effect on regular exercise. Patients will learn how to maintain good posture while sitting, walking, and lifting.  Balance Training and Fall Prevention  Clinical staff led group instruction and group discussion with PowerPoint presentation and patient guidebook. To enhance the learning environment the use of posters, models and videos may be added. At the conclusion of this workshop, patients will understand the importance of their sensorimotor skills (vision, proprioception, and the vestibular system) in maintaining their ability to balance as they age. Patients will apply a variety of balancing exercises that are appropriate for their current level of function. Patients will understand the common causes for poor balance, possible solutions to these problems, and ways to modify their physical environment in order to minimize their fall risk. The purpose of this lesson is to teach patients about the importance of maintaining balance as they age and ways to minimize their risk of falling.  WORKSHOPS   Nutrition:  Fueling a Ship broker led group instruction and group discussion with PowerPoint presentation and patient guidebook. To enhance the learning environment the use of posters, models and videos may be added. Patients will review the foundational principles of the Pritikin Eating Plan and understand  what constitutes a serving size in each of the food groups. Patients will also learn Pritikin-friendly foods that are better choices when away from home and review make-ahead meal and snack options. Calorie density will be reviewed and applied to three nutrition priorities: weight maintenance, weight loss, and weight gain. The purpose of this lesson is to reinforce (in a group setting) the key concepts around what patients are recommended to eat and how to apply these guidelines when away from home by planning and selecting Pritikin-friendly options. Patients will understand how calorie density may be adjusted for different weight management goals.  Mindful Eating  Clinical staff led group instruction and group discussion with PowerPoint presentation and patient guidebook. To enhance the learning environment the use of posters, models and videos may be added. Patients will briefly review the concepts of the Pritikin Eating Plan and the importance of low-calorie dense foods. The concept of mindful eating will be introduced as well as the importance of paying attention to internal hunger signals. Triggers for non-hunger eating and techniques for dealing with triggers will be explored. The purpose of this lesson is to provide patients with the opportunity to review the basic principles of the Pritikin Eating Plan, discuss the value of eating mindfully and how to measure internal cues of hunger and fullness using the Hunger Scale. Patients will also discuss reasons for non-hunger eating and learn strategies to use for controlling emotional eating.  Targeting Your Nutrition Priorities Clinical staff led group instruction and group discussion with PowerPoint presentation and patient guidebook. To enhance the learning environment the use of posters, models and videos may be added. Patients will learn how to determine their genetic susceptibility to disease by reviewing their family history. Patients will gain  insight into the importance of diet as part of an overall healthy lifestyle in mitigating the impact of genetics and other environmental insults. The purpose of this lesson is to provide patients with the opportunity to assess their personal nutrition priorities by looking at their family history, their own health history and current risk factors. Patients will also be able to discuss ways of prioritizing and modifying  the Pritikin Eating Plan for their highest risk areas  Menu  Clinical staff led group instruction and group discussion with PowerPoint presentation and patient guidebook. To enhance the learning environment the use of posters, models and videos may be added. Using menus brought in from [employer], or printed from [employer], patients will apply the Pritikin dining out guidelines that were presented in the [employer] video. Patients will also be able to practice these guidelines in a variety of provided scenarios. The purpose of this lesson is to provide patients with the opportunity to practice hands-on learning of the Pritikin Dining Out guidelines with actual menus and practice scenarios.  Label Reading Clinical staff led group instruction and group discussion with PowerPoint presentation and patient guidebook. To enhance the learning environment the use of posters, models and videos may be added. Patients will review and discuss the Pritikin label reading guidelines presented in Pritikin's Label Reading Educational series video. Using fool labels brought in from local grocery stores and markets, patients will apply the label reading guidelines and determine if the packaged food meet the Pritikin guidelines. The purpose of this lesson is to provide patients with the opportunity to review, discuss, and practice hands-on learning of the Pritikin Label Reading guidelines with actual packaged food labels. Cooking School  Pritikin's [employer] are  designed to teach patients ways to prepare quick, simple, and affordable recipes at home. The importance of nutrition's role in chronic disease risk reduction is reflected in its emphasis in the overall Pritikin program. By learning how to prepare essential core Pritikin Eating Plan recipes, patients will increase control over what they eat; be able to customize the flavor of foods without the use of added salt, sugar, or fat; and improve the quality of the food they consume. By learning a set of core recipes which are easily assembled, quickly prepared, and affordable, patients are more likely to prepare more healthy foods at home. These workshops focus on convenient breakfasts, simple entres, side dishes, and desserts which can be prepared with minimal effort and are consistent with nutrition recommendations for cardiovascular risk reduction. Cooking [employer] are taught by a Armed forces logistics/support/administrative officer (RD) who has been trained by the [employer]. The chef or RD has a clear understanding of the importance of minimizing - if not completely eliminating - added fat, sugar, and sodium in recipes. Throughout the series of Cooking School Workshop sessions, patients will learn about healthy ingredients and efficient methods of cooking to build confidence in their capability to prepare    Cooking School weekly topics:  Adding Flavor- Sodium-Free  Fast and Healthy Breakfasts  Powerhouse Plant-Based Proteins  Satisfying Salads and Dressings  Simple Sides and Sauces  International Cuisine-Spotlight on the [employer] Zones  Delicious Desserts  Savory Soups  [employer] - Meals in a Astronomer Appetizers and Snacks  Comforting Weekend Breakfasts  One-Pot Wonders   Fast Evening Meals  Landscape architect Your Pritikin Plate  WORKSHOPS   Healthy Mindset (Psychosocial):  Focused Goals, Sustainable Changes Clinical staff led group instruction and group discussion  with PowerPoint presentation and patient guidebook. To enhance the learning environment the use of posters, models and videos may be added. Patients will be able to apply effective goal setting strategies to establish at least one personal goal, and then take consistent, meaningful action toward that goal. They will learn to identify common barriers to achieving personal goals and develop strategies to overcome  them. Patients will also gain an understanding of how our mind-set can impact our ability to achieve goals and the importance of cultivating a positive and growth-oriented mind-set. The purpose of this lesson is to provide patients with a deeper understanding of how to set and achieve personal goals, as well as the tools and strategies needed to overcome common obstacles which may arise along the way.  From Head to Heart: The Power of a Healthy Outlook  Clinical staff led group instruction and group discussion with PowerPoint presentation and patient guidebook. To enhance the learning environment the use of posters, models and videos may be added. Patients will be able to recognize and describe the impact of emotions and mood on physical health. They will discover the importance of self-care and explore self-care practices which may work for them. Patients will also learn how to utilize the 4 C's to cultivate a healthier outlook and better manage stress and challenges. The purpose of this lesson is to demonstrate to patients how a healthy outlook is an essential part of maintaining good health, especially as they continue their cardiac rehab journey.  Healthy Sleep for a Healthy Heart Clinical staff led group instruction and group discussion with PowerPoint presentation and patient guidebook. To enhance the learning environment the use of posters, models and videos may be added. At the conclusion of this workshop, patients will be able to demonstrate knowledge of the importance of sleep to overall  health, well-being, and quality of life. They will understand the symptoms of, and treatments for, common sleep disorders. Patients will also be able to identify daytime and nighttime behaviors which impact sleep, and they will be able to apply these tools to help manage sleep-related challenges. The purpose of this lesson is to provide patients with a general overview of sleep and outline the importance of quality sleep. Patients will learn about a few of the most common sleep disorders. Patients will also be introduced to the concept of "sleep hygiene," and discover ways to self-manage certain sleeping problems through simple daily behavior changes. Finally, the workshop will motivate patients by clarifying the links between quality sleep and their goals of heart-healthy living.   Recognizing and Reducing Stress Clinical staff led group instruction and group discussion with PowerPoint presentation and patient guidebook. To enhance the learning environment the use of posters, models and videos may be added. At the conclusion of this workshop, patients will be able to understand the types of stress reactions, differentiate between acute and chronic stress, and recognize the impact that chronic stress has on their health. They will also be able to apply different coping mechanisms, such as reframing negative self-talk. Patients will have the opportunity to practice a variety of stress management techniques, such as deep abdominal breathing, progressive muscle relaxation, and/or guided imagery.  The purpose of this lesson is to educate patients on the role of stress in their lives and to provide healthy techniques for coping with it.  Learning Barriers/Preferences:  Learning Barriers/Preferences - 09/02/22 0841       Learning Barriers/Preferences   Learning Barriers Sight   glasses   Learning Preferences Audio;Computer/Internet;Group Instruction;Individual Instruction;Skilled Demonstration;Pictoral;Verbal  Instruction;Video;Written Material             Education Topics:  Knowledge Questionnaire Score:  Knowledge Questionnaire Score - 09/02/22 0840       Knowledge Questionnaire Score   Pre Score 21/24             Core Components/Risk Factors/Patient Goals at Admission:  Personal Goals and Risk Factors at Admission - 09/02/22 0841       Core Components/Risk Factors/Patient Goals on Admission    Weight Management Yes;Obesity;Weight Loss    Intervention Weight Management: Provide education and appropriate resources to help participant work on and attain dietary goals.;Weight Management: Develop a combined nutrition and exercise program designed to reach desired caloric intake, while maintaining appropriate intake of nutrient and fiber, sodium and fats, and appropriate energy expenditure required for the weight goal.;Weight Management/Obesity: Establish reasonable short term and long term weight goals.;Obesity: Provide education and appropriate resources to help participant work on and attain dietary goals.    Admit Weight 200 lb 9.9 oz (91 kg)    Expected Outcomes Short Term: Continue to assess and modify interventions until short term weight is achieved;Long Term: Adherence to nutrition and physical activity/exercise program aimed toward attainment of established weight goal;Weight Loss: Understanding of general recommendations for a balanced deficit meal plan, which promotes 1-2 lb weight loss per week and includes a negative energy balance of [phone] kcal/d;Understanding recommendations for meals to include 15-35% energy as protein, 25-35% energy from fat, 35-60% energy from carbohydrates, less than 200mg  of dietary cholesterol, 20-35 gm of total fiber daily;Understanding of distribution of calorie intake throughout the day with the consumption of 4-5 meals/snacks    Diabetes Yes    Intervention Provide education about signs/symptoms and action to take for hypo/hyperglycemia.;Provide  education about proper nutrition, including hydration, and aerobic/resistive exercise prescription along with prescribed medications to achieve blood glucose in normal ranges: Fasting glucose 65-99 mg/dL    Expected Outcomes Short Term: Participant verbalizes understanding of the signs/symptoms and immediate care of hyper/hypoglycemia, proper foot care and importance of medication, aerobic/resistive exercise and nutrition plan for blood glucose control.;Long Term: Attainment of HbA1C < 7%.    Hypertension Yes    Intervention Provide education on lifestyle modifcations including regular physical activity/exercise, weight management, moderate sodium restriction and increased consumption of fresh fruit, vegetables, and low fat dairy, alcohol moderation, and smoking cessation.;Monitor prescription use compliance.    Expected Outcomes Short Term: Continued assessment and intervention until BP is < 140/45mm HG in hypertensive participants. < 130/45mm HG in hypertensive participants with diabetes, heart failure or chronic kidney disease.;Long Term: Maintenance of blood pressure at goal levels.    Lipids Yes    Intervention Provide education and support for participant on nutrition & aerobic/resistive exercise along with prescribed medications to achieve LDL 70mg , HDL >40mg .    Expected Outcomes Short Term: Participant states understanding of desired cholesterol values and is compliant with medications prescribed. Participant is following exercise prescription and nutrition guidelines.;Long Term: Cholesterol controlled with medications as prescribed, with individualized exercise RX and with personalized nutrition plan. Value goals: LDL < 70mg , HDL > 40 mg.    Stress Yes    Intervention Offer individual and/or small group education and counseling on adjustment to heart disease, stress management and health-related lifestyle change. Teach and support self-help strategies.;Refer participants experiencing significant  psychosocial distress to appropriate mental health specialists for further evaluation and treatment. When possible, include family members and significant others in education/counseling sessions.    Expected Outcomes Short Term: Participant demonstrates changes in health-related behavior, relaxation and other stress management skills, ability to obtain effective social support, and compliance with psychotropic medications if prescribed.;Long Term: Emotional wellbeing is indicated by absence of clinically significant psychosocial distress or social isolation.             Core Components/Risk Factors/Patient Goals Review:  Core Components/Risk Factors/Patient Goals at Discharge (Final Review):    ITP Comments:  ITP Comments     Row Name 09/02/22 0803           ITP Comments Dr. Armanda Magic medical director. Introduction to [facility] education/ intensive cardiac rehab. Initial orientation packet reviewed with patient.                Comments: Participant attended orientation for the cardiac rehabilitation program on  09/02/2022  to perform initial intake and exercise walk test. Patient introduced to the [facility] education and orientation packet was reviewed. Completed 6-minute walk test, measurements, initial ITP, and exercise prescription. Vital signs stable. Telemetry-normal sinus rhythm, asymptomatic. 6/10 Bilateral hip pain after , resolved to 3/10 after seated. Pt states this is chronic and she is seeing MD soon for evaluation. No other pain or symptoms.   Service time was from 0755 to [phone].     Jonna Coup, MS, ACSM-CEP 09/02/2022 12:17 PM

## 2022-09-02 NOTE — Progress Notes (Signed)
Cardiac Rehab Medication Review   Does the patient  feel that his/her medications are working for him/her?  YES   Has the patient been experiencing any side effects to the medications prescribed?  YES  Does the patient measure his/her own blood pressure or blood glucose at home?  YES    Does the patient have any problems obtaining medications due to transportation or finances?   NO  Understanding of regimen: excellent Understanding of indications: excellent Potential of compliance: excellent    Comments: Kayla Berger understands her medication and regime. She has had some nausea and vomiting since starting [employer]. She checks her BP at home and has CGM for glucose readings.     Kayla Coup, MS, ACSM-CEP 09/02/2022 8:22 AM

## 2022-09-08 ENCOUNTER — Other Ambulatory Visit: Payer: Self-pay | Admitting: Cardiology

## 2022-09-08 DIAGNOSIS — I2510 Atherosclerotic heart disease of native coronary artery without angina pectoris: Secondary | ICD-10-CM

## 2022-09-09 ENCOUNTER — Encounter (HOSPITAL_COMMUNITY)
Admission: RE | Admit: 2022-09-09 | Discharge: 2022-09-09 | Disposition: A | Discharge status: Home / Self Care | Payer: 59 | Source: Ambulatory Visit | Attending: Cardiology | Admitting: Cardiology

## 2022-09-09 DIAGNOSIS — Z951 Presence of aortocoronary bypass graft: Secondary | ICD-10-CM

## 2022-09-09 DIAGNOSIS — Z48812 Encounter for surgical aftercare following surgery on the circulatory system: Secondary | ICD-10-CM | POA: Diagnosis not present

## 2022-09-09 LAB — GLUCOSE, CAPILLARY
Glucose-Capillary: 143 mg/dL — ABNORMAL HIGH (ref 70–99)
Glucose-Capillary: 82 mg/dL (ref 70–99)

## 2022-09-09 NOTE — Progress Notes (Signed)
Daily Session Note  Patient Details  Name: Kayla Berger MRN: [id number] Date of Birth: 12/26/1960 Referring Provider:   Flowsheet Row INTENSIVE CARDIAC REHAB ORIENT from 09/02/2022 in [facility]  Referring Provider Yates Decamp, MD       Encounter Date: 09/09/2022  Check In:  Session Check In - 09/09/22 0718       Check-In   Supervising physician immediately available to respond to emergencies [facility] MD immediately available    Physician(s) Jari Favre, PA-C    Location [facility]    Staff Present Valinda Party, MS, Exercise Physiologist;Kaylee Earlene Plater, MS, ACSM-CEP, Exercise Physiologist;Olinty Peggye Pitt, MS, ACSM-CEP, Exercise Physiologist; Remus Loffler, RN, MHA;Johnny Hale Bogus, MS, Exercise Physiologist    Virtual Visit No    Medication changes reported     No    Fall or balance concerns reported    No    Tobacco Cessation No Change    Warm-up and Cool-down Performed as group-led instruction    Resistance Training Performed No    VAD Patient? No    PAD/SET Patient? No      Pain Assessment   Currently in Pain? No/denies    Pain Score 0-No pain    Multiple Pain Sites No             Capillary Blood Glucose: Results for orders placed or performed during the hospital encounter of 09/09/22 (from the past 24 hour(s))  Glucose, capillary     Status: None   Collection Time: 09/09/22  8:01 AM  Result Value Ref Range   Glucose-Capillary 82 70 - 99 mg/dL      Social History   Tobacco Use  Smoking Status Never  Smokeless Tobacco Never    Goals Met:  Completed warmup routine and both exercise modalities without issues.  Goals Unmet:  Drop in blood sugar post exercise unable to complete weights and cool-down phase.  Comments: Pt in today for cardiac rehab today. VSS, telemetry-Sinus rhythm. Medication list reconciled at intake last week and reports no changes today. Pt denies barriers to  medication compliance.  PSYCHOSOCIAL ASSESSMENT:   PHQ9=5.  Discussed score. Kayla Berger shared it has been a challenging past year with various health/medical issues. Looks forward to returning to work next week and participating in cardiac rehab which Kayla Berger feels will help her overall. Regarding appetite she shared with medications she has not had the best appetite or taste for foods and between her diabetes management, CAD, and cooking for herself and her sister, who has her own dietary needs, there have been some challenges. No psychosocial needs identified at this time and no interventions necessary but will continue to follow.  Oriented to gym routine. Kayla Berger completed warm-up and the two exercise stations but did not participate in the weights and stretches due to blood sugar drop. Pre-exercise blood sugar with Nova-glucose glucometer was 143 and her device showed 142. Blood sugar post recumbent bike was trending down at 85, then by the time she sat in chair down to 75. Provided 8 oz apple juice. Recheck with her device demonstrated trend down and at 65. Provided grahams and she ate one graham craker but due to drop and device showing trend downward provided patient with 1 tube glucose gel. Recheck blood sugar after 10 minutes with nova-glucose glucometer and result was 82 and her device was showing trend upward at 88. She reported feeling fine. Completed showing her gym routine post exercise and she was  going to go to education. After walking to drop off items she felt somewhat dizzy. Had her sit BP was 98/62 provided 16 oz of water and recheck BP after 7 minutes which was 108/74 and felt much better reporting no longer any dizziness. Stood up without any issues.  Escorted to lobby where her sister was waiting. Instead of going to education class, felt it was best today to go home. Kayla Berger said she will adjust her sliding scale insulin Wednesday morning to account for the exercise activity.   Dr. Armanda Magic  is Medical Director for Cardiac Rehab at [facility].

## 2022-09-11 ENCOUNTER — Encounter (HOSPITAL_COMMUNITY)
Admission: RE | Admit: 2022-09-11 | Discharge: 2022-09-11 | Disposition: A | Discharge status: Home / Self Care | Payer: 59 | Source: Ambulatory Visit | Attending: Cardiology | Admitting: Cardiology

## 2022-09-11 DIAGNOSIS — Z951 Presence of aortocoronary bypass graft: Secondary | ICD-10-CM | POA: Diagnosis not present

## 2022-09-11 LAB — GLUCOSE, CAPILLARY: Glucose-Capillary: 159 mg/dL — ABNORMAL HIGH (ref 70–99)

## 2022-09-11 NOTE — Progress Notes (Signed)
QUALITY OF LIFE SCORE REVIEW Pt completed Quality of Life survey as a participant in Cardiac Rehab.  Scores 19.0 or below are considered low.  Overall 20.79, Health and Function 18.67, socioeconomic 25.0, physiological and spiritual 18.5, family 24.5. Patient quality of life slightly altered by physical constraints which limits ability to perform as prior to recent cardiac illness as well as the additional medical challenges she has experienced this past year. Vernelle looks forward to the human interactions that she experiences with work, returns next week. She has reconnected with her Church and the Renato Gails has been very supportive and encouraged her to reach out to him anytime to talk. Her daughter is also very supportive and followed up with Cordelia Monday to check on her and see how her first day at cardiac rehab went. We will continue to offer emotional support and reassurance. Will continue to monitor and intervene as necessary.

## 2022-09-13 ENCOUNTER — Encounter (HOSPITAL_COMMUNITY)
Admission: RE | Admit: 2022-09-13 | Discharge: 2022-09-13 | Disposition: A | Discharge status: Home / Self Care | Payer: 59 | Source: Ambulatory Visit | Attending: Cardiology | Admitting: Cardiology

## 2022-09-13 DIAGNOSIS — Z951 Presence of aortocoronary bypass graft: Secondary | ICD-10-CM

## 2022-09-16 ENCOUNTER — Encounter (HOSPITAL_COMMUNITY): Admission: RE | Admit: 2022-09-16 | Payer: 59 | Source: Ambulatory Visit

## 2022-09-16 DIAGNOSIS — Z951 Presence of aortocoronary bypass graft: Secondary | ICD-10-CM

## 2022-09-18 ENCOUNTER — Other Ambulatory Visit: Payer: Self-pay | Admitting: Cardiology

## 2022-09-18 ENCOUNTER — Encounter (HOSPITAL_COMMUNITY): Payer: 59

## 2022-09-18 DIAGNOSIS — E8779 Other fluid overload: Secondary | ICD-10-CM

## 2022-09-19 ENCOUNTER — Other Ambulatory Visit: Payer: Self-pay | Admitting: Gastroenterology

## 2022-09-20 ENCOUNTER — Encounter (HOSPITAL_COMMUNITY)
Admission: RE | Admit: 2022-09-20 | Discharge: 2022-09-20 | Disposition: A | Discharge status: Home / Self Care | Payer: 59 | Source: Ambulatory Visit | Attending: Cardiology | Admitting: Cardiology

## 2022-09-20 ENCOUNTER — Encounter: Payer: Self-pay | Admitting: Cardiology

## 2022-09-20 DIAGNOSIS — Z951 Presence of aortocoronary bypass graft: Secondary | ICD-10-CM

## 2022-09-20 NOTE — Progress Notes (Signed)
Cardiac Individual Treatment Plan  Patient Details  Name: Kayla Berger MRN: [id number] Date of Birth: 1960-07-21 Referring Provider:   Flowsheet Row INTENSIVE CARDIAC REHAB ORIENT from 09/02/2022 in [facility]  Referring Provider Yates Decamp, MD       Initial Encounter Date:  Flowsheet Row INTENSIVE CARDIAC REHAB ORIENT from 09/02/2022 in [facility]  Date 09/02/22       Visit Diagnosis: S/P CABG x 2  Patient's Home Medications on Admission:  Current Outpatient Medications:    Alpha Lipoic Acid 200 MG CAPS, Take 200 mg by mouth at bedtime., Disp: , Rfl:    amiodarone (PACERONE) 200 MG tablet, Take 200 mg by mouth daily. (Patient not taking: Reported on 09/02/2022), Disp: , Rfl:    aspirin 81 MG chewable tablet, Chew 81 mg by mouth in the morning., Disp: , Rfl:    atorvastatin (LIPITOR) 10 MG tablet, Take 10 mg by mouth at bedtime., Disp: , Rfl:    Biotin 34742 MCG TABS, Take 10,000 mcg by mouth every evening., Disp: , Rfl:    cetirizine (ZYRTEC) 10 MG tablet, Take 10 mg by mouth in the morning., Disp: , Rfl:    Cholecalciferol 25 MCG (1000 UT) capsule, Take 1,000 Units by mouth 2 (two) times daily. (Patient not taking: Reported on 09/02/2022), Disp: , Rfl:    clopidogrel (PLAVIX) 75 MG tablet, Take 1 tablet (75 mg total) by mouth daily., Disp: 90 tablet, Rfl: 1   CRANBERRY PO, Take 15,000 mg by mouth in the morning., Disp: , Rfl:    DULoxetine (CYMBALTA) 60 MG capsule, Take 60 mg by mouth 2 (two) times daily., Disp: , Rfl:    EXCEDRIN MIGRAINE 250-250-65 MG tablet, Take 2 tablets by mouth every 6 (six) hours as needed for headache or migraine., Disp: , Rfl:    furosemide (LASIX) 20 MG tablet, TAKE 1 TABLET BY MOUTH ONCE DAILY AS NEEDED FOR FLUID, Disp: 30 tablet, Rfl: 0   gabapentin (NEURONTIN) 800 MG tablet, Take 400 mg by mouth at bedtime., Disp: , Rfl:    HUMALOG  KWIKPEN 100 UNIT/ML KwikPen, Inject 5-12 Units into the skin 3 (three) times daily as needed (for a BGL greater than 100)., Disp: , Rfl:    insulin glargine, 2 Unit Dial, (TOUJEO MAX SOLOSTAR) 300 UNIT/ML Solostar Pen, Inject 10 Units into the skin at bedtime. (Patient taking differently: Inject 58 Units into the skin at bedtime.), Disp: , Rfl:    JARDIANCE 10 MG TABS tablet, Take 10 mg by mouth in the morning., Disp: , Rfl:    metoprolol tartrate (LOPRESSOR) 25 MG tablet, Take 1 tablet by mouth twice daily, Disp: 180 tablet, Rfl: 0   Multiple Vitamins-Minerals (CENTRUM WOMEN) TABS, Take 1 tablet by mouth daily with breakfast., Disp: , Rfl:    Nutritional Supplements (EQUATE PO), Take by mouth., Disp: , Rfl:    oxyCODONE (OXY IR/ROXICODONE) 5 MG immediate release tablet, Take 5 mg by mouth every 4 (four) hours as needed for moderate pain or severe pain., Disp: , Rfl:    pantoprazole (PROTONIX) 40 MG tablet, Take 1 tablet (40 mg total) by mouth 2 (two) times daily. (Patient taking differently: Take 40 mg by mouth daily.), Disp: 60 tablet, Rfl: 0   polyethylene glycol powder (GLYCOLAX/MIRALAX) 17 GM/SCOOP powder, Take 1 Container by mouth once., Disp: , Rfl:    potassium chloride (KLOR-CON) 10 MEQ tablet, TAKE  1 TABLET BY MOUTH ONCE DAILY AS NEEDED WITH FUROSEMIDE ONLY, Disp: 30 tablet, Rfl: 0   RYBELSUS 3 MG TABS, Take 1 tablet by mouth daily., Disp: , Rfl:    zinc gluconate 50 MG tablet, Take 50 mg by mouth in the morning., Disp: , Rfl:   Past Medical History: 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   Migraines    occ   PCOS (polycystic ovarian syndrome) 1986   PONV (postoperative nausea and  vomiting)    Renal disorder    Shoulder pain    Thyroid disease    Trochanteric bursitis of left hip    Vaginal cysts    4 have been removed    Tobacco Use: Social History   Tobacco Use  Smoking Status Never  Smokeless Tobacco Never    Labs: Review Flowsheet  More data exists      Latest Ref Rng & Units 10/31/2016 02/13/2021 08/02/2021 08/04/2021 01/19/2022  Labs for ITP Cardiac and Pulmonary Rehab  Hemoglobin A1c 4.8 - 5.6 % 12.3  7.1  - 6.6  -  Bicarbonate 20.0 - 28.0 mmol/L - - 44.2  42.1  - -  TCO2 22 - 32 mmol/L - - 44  - 28   O2 Saturation % - - 55.1  91  - -    Details       Multiple values from one day are sorted in reverse-chronological order         Capillary Blood Glucose: Lab Results  Component Value Date   GLUCAP 159 (H) 09/11/2022   GLUCAP 82 09/09/2022   GLUCAP 143 (H) 09/09/2022   GLUCAP 122 (H) 04/05/2022   GLUCAP 60 (L) 04/05/2022     Exercise Target Goals: Exercise Program Goal: Individual exercise prescription set using results from initial 6 min walk test and THRR while considering  patient's activity barriers and safety.   Exercise Prescription Goal: Initial exercise prescription builds to 30-45 minutes a day of aerobic activity, 2-3 days per week.  Home exercise guidelines will be given to patient during program as part of exercise prescription that the participant will acknowledge.  Activity Barriers & Risk Stratification:  Activity Barriers & Cardiac Risk Stratification - 09/02/22 1206       Activity Barriers & Cardiac Risk Stratification   Activity Barriers Back Problems;Arthritis;Neck/Spine Problems;History of Falls;Balance Concerns;Deconditioning;Joint Problems    Cardiac Risk Stratification High   <5 METs on            6 Minute Walk:  6 Minute Walk     Row Name 09/02/22 1205         6 Minute Walk   Phase Initial     Distance 1680 feet     Walk Time 6 minutes     # of Rest Breaks 0     MPH 3.18     METS 3.82      RPE 13     Perceived Dyspnea  0     VO2 Peak 13.39     Symptoms Yes (comment)     Comments 6/10 Bilateral hip pain  Resting HR 92 bpm     Resting BP 110/74     Resting Oxygen Saturation  96 %     Exercise Oxygen Saturation  during 6 min walk 98 %     Max Ex. HR 114 bpm     Max Ex. BP 146/82     2 Minute Post BP 124/82              Oxygen Initial Assessment:   Oxygen Re-Evaluation:   Oxygen Discharge (Final Oxygen Re-Evaluation):   Initial Exercise Prescription:  Initial Exercise Prescription - 09/02/22 1200       Date of Initial Exercise RX and Referring Provider   Date 09/02/22    Referring Provider Yates Decamp, MD    Expected Discharge Date 11/27/22      Recumbant Bike   Level 1    RPM 55    Watts 30    Minutes 15    METs 2.5      NuStep   Level 1    SPM 60    Minutes 15    METs 2      Prescription Details   Frequency (times per week) 3    Duration Progress to 30 minutes of continuous aerobic without signs/symptoms of physical distress      Intensity   THRR 40-80% of Max Heartrate 64-127    Ratings of Perceived Exertion 11-13    Perceived Dyspnea 0-4      Progression   Progression Continue progressive overload as per policy without signs/symptoms or physical distress.      Resistance Training   Training Prescription Yes    Weight 2    Reps 10-15             Perform Capillary Blood Glucose checks as needed.  Exercise Prescription Changes:   Exercise Prescription Changes     Row Name 09/09/22 1300             Response to Exercise   Blood Pressure (Admit) 122/72       Blood Pressure (Exercise) 130/68       Blood Pressure (Exit) 108/74       Heart Rate (Admit) 96 bpm       Heart Rate (Exercise) 109 bpm       Heart Rate (Exit) 97 bpm       Rating of Perceived Exertion (Exercise) 8       Symptoms none       Comments skipped weights d/t low BS       Duration Continue with 30 min of aerobic exercise without signs/symptoms  of physical distress.       Intensity THRR unchanged         Progression   Progression Continue to progress workloads to maintain intensity without signs/symptoms of physical distress.       Average METs 2.4         Resistance Training   Training Prescription No  Low BS         Recumbant Bike   Level 1       RPM 73       Watts 14       Minutes 15       METs 2.5         NuStep   Level 1       SPM 78       Minutes 15       METs 2.2  Exercise Comments:   Exercise Comments     Row Name 09/09/22 1315           Exercise Comments Pt first day in program, pt was educated on THRR, RPE, and METs. Pt exercised at an avg of 2.2 METs on the NS and 2.5 METs on the RB. Will increase WL as tolerated.                Exercise Goals and Review:   Exercise Goals     Row Name 09/02/22 0804             Exercise Goals   Increase Physical Activity Yes       Intervention Provide advice, education, support and counseling about physical activity/exercise needs.;Develop an individualized exercise prescription for aerobic and resistive training based on initial evaluation findings, risk stratification, comorbidities and participant's personal goals.       Expected Outcomes Short Term: Attend rehab on a regular basis to increase amount of physical activity.;Long Term: Exercising regularly at least 3-5 days a week.;Long Term: Add in home exercise to make exercise part of routine and to increase amount of physical activity.       Increase Strength and Stamina Yes       Intervention Provide advice, education, support and counseling about physical activity/exercise needs.;Develop an individualized exercise prescription for aerobic and resistive training based on initial evaluation findings, risk stratification, comorbidities and participant's personal goals.       Expected Outcomes Short Term: Increase workloads from initial exercise prescription for resistance, speed, and  METs.;Short Term: Perform resistance training exercises routinely during rehab and add in resistance training at home;Long Term: Improve cardiorespiratory fitness, muscular endurance and strength as measured by increased METs and functional capacity ( )       Able to understand and use rate of perceived exertion (RPE) scale Yes       Intervention Provide education and explanation on how to use RPE scale       Expected Outcomes Short Term: Able to use RPE daily in rehab to express subjective intensity level;Long Term:  Able to use RPE to guide intensity level when exercising independently       Knowledge and understanding of Target Heart Rate Range (THRR) Yes       Intervention Provide education and explanation of THRR including how the numbers were predicted and where they are located for reference       Expected Outcomes Short Term: Able to state/look up THRR;Short Term: Able to use daily as guideline for intensity in rehab;Long Term: Able to use THRR to govern intensity when exercising independently       Understanding of Exercise Prescription Yes       Intervention Provide education, explanation, and written materials on patient's individual exercise prescription       Expected Outcomes Short Term: Able to explain program exercise prescription;Long Term: Able to explain home exercise prescription to exercise independently                Exercise Goals Re-Evaluation :  Exercise Goals Re-Evaluation     Row Name 09/09/22 1310             Exercise Goal Re-Evaluation   Exercise Goals Review Increase Physical Activity;Understanding of Exercise Prescription;Increase Strength and Stamina;Knowledge and understanding of Target Heart Rate Range (THRR);Able to understand and use rate of perceived exertion (RPE) scale       Comments Pt first day in program, pt was educated on  THRR, RPE, and METs. Pt exercised at an avg of 2.2 METs on the NS and 2.5 METs on the RB. Pt had to skip weights d/t her  BS dropping, pt received glucose gel and other snacks to raise BS. Pt was asymptomatic.       Expected Outcomes Will increase WL as tolerated barring s/s.                Discharge Exercise Prescription (Final Exercise Prescription Changes):  Exercise Prescription Changes - 09/09/22 1300       Response to Exercise   Blood Pressure (Admit) 122/72    Blood Pressure (Exercise) 130/68    Blood Pressure (Exit) 108/74    Heart Rate (Admit) 96 bpm    Heart Rate (Exercise) 109 bpm    Heart Rate (Exit) 97 bpm    Rating of Perceived Exertion (Exercise) 8    Symptoms none    Comments skipped weights d/t low BS    Duration Continue with 30 min of aerobic exercise without signs/symptoms of physical distress.    Intensity THRR unchanged      Progression   Progression Continue to progress workloads to maintain intensity without signs/symptoms of physical distress.    Average METs 2.4      Resistance Training   Training Prescription No   Low BS     Recumbant Bike   Level 1    RPM 73    Watts 14    Minutes 15    METs 2.5      NuStep   Level 1    SPM 78    Minutes 15    METs 2.2             Nutrition:  Target Goals: Understanding of nutrition guidelines, daily intake of sodium 1500mg , cholesterol 200mg , calories 30% from fat and 7% or less from saturated fats, daily to have 5 or more servings of fruits and vegetables.  Biometrics:  Pre Biometrics - 09/02/22 0829       Pre Biometrics   Waist Circumference 42.5 inches    Hip Circumference 49 inches    Waist to Hip Ratio 0.87 %    Triceps Skinfold 45 mm    % Body Fat 48.1 %    Grip Strength 22 kg    Flexibility 0 in   pt has chronic back issues, not done   Single Leg Stand 5 seconds              Nutrition Therapy Plan and Nutrition Goals:  Nutrition Therapy & Goals - 09/09/22 0904       Nutrition Therapy   Diet Heart Healthy Diet    Drug/Food Interactions Statins/Certain Fruits      Personal Nutrition  Goals   Nutrition Goal Patient to identify strategies for reducing cardiovascular risk by attending the [employer] education and nutrition series weekly.    Personal Goal #2 Patient to improve diet quality by using the plate method as a guide for meal planning to include lean protein/plant protein, fruits, vegetables, whole grains, nonfat dairy as part of a well-balanced diet.    Personal Goal #3 Patient to reduce sodium intake to 1500mg  per day    Personal Goal #4 Patient to identify strategies for blood sugar control with goal A1c <7%.    Comments Tanner is motivated to improve eating habits, decrease sweets/simple sugar intake, and eat on a regular/consistent schedule. Patient will benefit from participation in intensive cardiac rehab for nutrition, exercise, and lifestyle  modification.      Intervention Plan   Intervention Prescribe, educate and counsel regarding individualized specific dietary modifications aiming towards targeted core components such as weight, hypertension, lipid management, diabetes, heart failure and other comorbidities.;Nutrition handout(s) given to patient.    Expected Outcomes Short Term Goal: Understand basic principles of dietary content, such as calories, fat, sodium, cholesterol and nutrients.;Long Term Goal: Adherence to prescribed nutrition plan.             Nutrition Assessments:  Nutrition Assessments - 09/09/22 0913       Rate Your Plate Scores   Pre Score 63            MEDIFICTS Score Key: ?70 Need to make dietary changes  40-70 Heart Healthy Diet ? 40 Therapeutic Level Cholesterol Diet   Flowsheet Row INTENSIVE CARDIAC REHAB from 09/09/2022 in [facility]  Picture Your Plate Total Score on Admission 63      Picture Your Plate Scores: <60 Unhealthy dietary pattern with much room for improvement. 41-50 Dietary pattern unlikely to meet recommendations for good health and room for  improvement. 51-60 More healthful dietary pattern, with some room for improvement.  >60 Healthy dietary pattern, although there may be some specific behaviors that could be improved.    Nutrition Goals Re-Evaluation:  Nutrition Goals Re-Evaluation     Row Name 09/09/22 0904             Goals   Current Weight 196 lb 10.4 oz (89.2 kg)       Comment A1c 7.0, lipoproteinA 104.5, LDL 71       Expected Outcome Ravinder is motivated to improve eating habits, decrease sweets/simple sugar intake, and eat on a regular/consistent schedule. Patient will benefit from participation in intensive cardiac rehab for nutrition, exercise, and lifestyle modification                Nutrition Goals Re-Evaluation:  Nutrition Goals Re-Evaluation     Row Name 09/09/22 0904             Goals   Current Weight 196 lb 10.4 oz (89.2 kg)       Comment A1c 7.0, lipoproteinA 104.5, LDL 71       Expected Outcome Zuriah is motivated to improve eating habits, decrease sweets/simple sugar intake, and eat on a regular/consistent schedule. Patient will benefit from participation in intensive cardiac rehab for nutrition, exercise, and lifestyle modification                Nutrition Goals Discharge (Final Nutrition Goals Re-Evaluation):  Nutrition Goals Re-Evaluation - 09/09/22 0904       Goals   Current Weight 196 lb 10.4 oz (89.2 kg)    Comment A1c 7.0, lipoproteinA 104.5, LDL 71    Expected Outcome Julena is motivated to improve eating habits, decrease sweets/simple sugar intake, and eat on a regular/consistent schedule. Patient will benefit from participation in intensive cardiac rehab for nutrition, exercise, and lifestyle modification             Psychosocial: Target Goals: Acknowledge presence or absence of significant depression and/or stress, maximize coping skills, provide positive support system. Participant is able to verbalize types and ability to use techniques and skills needed for  reducing stress and depression.  Initial Review & Psychosocial Screening:  Initial Psych Review & Screening - 09/02/22 0931       Initial Review   Current issues with Current Anxiety/Panic;Current Psychotropic  Meds;Current Stress Concerns;Current Depression    Source of Stress Concerns Chronic Illness;Financial    Comments Jody shared that she has felt some low levels of depression since her CAD diagnosis and post surgery. She has struggled to adjust to the lifestyle changes and learning what she can control vs. not control. She is taking Cymbalta and feels it is helping, does not feel she needs additional support. Kharizma is also very unhappy with her physical appearance.      Family Dynamics   Good Support System? Yes   Nialah has her sisters and daughter for support     Barriers   Psychosocial barriers to participate in program The patient should benefit from training in stress management and relaxation.      Screening Interventions   Interventions Encouraged to exercise;Provide feedback about the scores to participant;To provide support and resources with identified psychosocial needs    Expected Outcomes Long Term Goal: Stressors or current issues are controlled or eliminated.;Short Term goal: Identification and review with participant of any Quality of Life or Depression concerns found by scoring the questionnaire.;Long Term goal: The participant improves quality of Life and PHQ9 Scores as seen by post scores and/or verbalization of changes             Quality of Life Scores:  Quality of Life - 09/02/22 1214       Quality of Life   Select Quality of Life      Quality of Life Scores   Health/Function Pre 18.67 %    Socioeconomic Pre 25 %    Psych/Spiritual Pre 18.5 %    Family Pre 24.5 %    GLOBAL Pre 20.79 %            Scores of 19 and below usually indicate a poorer quality of life in these areas.  A difference of  2-3 points is a clinically meaningful  difference.  A difference of 2-3 points in the total score of the Quality of Life Index has been associated with significant improvement in overall quality of life, self-image, physical symptoms, and general health in studies assessing change in quality of life.  PHQ-9: Review Flowsheet       09/02/2022  Depression screen PHQ 2/9  Decreased Interest 0  Down, Depressed, Hopeless 1  PHQ - 2 Score 1  Altered sleeping 1  Tired, decreased energy 1  Change in appetite 1  Feeling bad or failure about yourself  1  Trouble concentrating 0  Moving slowly or fidgety/restless 0  Suicidal thoughts 0  PHQ-9 Score 5  Difficult doing work/chores Somewhat difficult    Details           Interpretation of Total Score  Total Score Depression Severity:  1-4 = Minimal depression, 5-9 = Mild depression, 10-14 = Moderate depression, 15-19 = Moderately severe depression, 20-27 = Severe depression   Psychosocial Evaluation and Intervention:   Psychosocial Re-Evaluation:  Psychosocial Re-Evaluation     Row Name 09/20/22 [phone]             Psychosocial Re-Evaluation   Current issues with Current Abuse or Neglect to Report;Current Stress Concerns;Current Anxiety/Panic;Current Psychotropic Meds       Comments Quality of life questionnarie reviewed on 09/11/22. Edell has good family support and has returned to work       Expected Outcomes Johnsie will have decreased concerns/ stressors upon completion of cardiac rehab       Interventions Stress management education;Relaxation education;Encouraged to attend Cardiac  Rehabilitation for the exercise       Continue Psychosocial Services  Follow up required by staff         Initial Review   Source of Stress Concerns Chronic Illness;Unable to perform yard/household activities;Unable to participate in former interests or hobbies       Comments Will continue to monitor and offer support as needed                Psychosocial Discharge (Final  Psychosocial Re-Evaluation):  Psychosocial Re-Evaluation - 09/20/22 0958       Psychosocial Re-Evaluation   Current issues with Current Abuse or Neglect to Report;Current Stress Concerns;Current Anxiety/Panic;Current Psychotropic Meds    Comments Quality of life questionnarie reviewed on 09/11/22. Carlisle has good family support and has returned to work    Expected Outcomes Johnsie will have decreased concerns/ stressors upon completion of cardiac rehab    Interventions Stress management education;Relaxation education;Encouraged to attend Cardiac Rehabilitation for the exercise    Continue Psychosocial Services  Follow up required by staff      Initial Review   Source of Stress Concerns Chronic Illness;Unable to perform yard/household activities;Unable to participate in former interests or hobbies    Comments Will continue to monitor and offer support as needed             Vocational Rehabilitation: Provide vocational rehab assistance to qualifying candidates.   Vocational Rehab Evaluation & Intervention:  Vocational Rehab - 09/02/22 0840       Initial Vocational Rehab Evaluation & Intervention   Assessment shows need for Vocational Rehabilitation No   Breelynn works for [employer]            Education: Education Goals: Education classes will be provided on a weekly basis, covering required topics. Participant will state understanding/return demonstration of topics presented.    Education     Row Name 09/11/22 1000     Education   Cardiac Education Topics [employer]ritikin   Secondary school teacher School   Educator Dietitian   Weekly Topic Powerhouse Plant-Based Proteins   Instruction Review Code 1- Verbalizes Understanding   Class Start Time 0815   Class Stop Time 0856   Class Time Calculation (min) 41 min    Row Name 09/13/22 0900     Education   Cardiac Education Topics Pritikin   Select Core Videos     Core Videos   Educator Exercise  Physiologist   Select General Education   General Education Hypertension and Heart Disease   Instruction Review Code 1- Verbalizes Understanding   Class Start Time [phone]   Class Stop Time 0850   Class Time Calculation (min) 40 min            Core Videos: Exercise    Move It!  Clinical staff conducted group or individual video education with verbal and written material and guidebook.  Patient learns the recommended Pritikin exercise program. Exercise with the goal of living a long, healthy life. Some of the health benefits of exercise include controlled diabetes, healthier blood pressure levels, improved cholesterol levels, improved heart and lung capacity, improved sleep, and better body composition. Everyone should speak with their doctor before starting or changing an exercise routine.  Biomechanical Limitations Clinical staff conducted group or individual video education with verbal and written material and guidebook.  Patient learns how biomechanical limitations can impact exercise and how we can mitigate and possibly overcome limitations to have an impactful and balanced  exercise routine.  Body Composition Clinical staff conducted group or individual video education with verbal and written material and guidebook.  Patient learns that body composition (ratio of muscle mass to fat mass) is a key component to assessing overall fitness, rather than body weight alone. Increased fat mass, especially visceral belly fat, can put [place] at increased risk for metabolic syndrome, type 2 diabetes, heart disease, and even death. It is recommended to combine diet and exercise (cardiovascular and resistance training) to improve your body composition. Seek guidance from your physician and exercise physiologist before implementing an exercise routine.  Exercise Action Plan Clinical staff conducted group or individual video education with verbal and written material and guidebook.  Patient learns the  recommended strategies to achieve and enjoy long-term exercise adherence, including variety, self-motivation, self-efficacy, and positive decision making. Benefits of exercise include fitness, good health, weight management, more energy, better sleep, less stress, and overall well-being.  Medical   Heart Disease Risk Reduction Clinical staff conducted group or individual video education with verbal and written material and guidebook.  Patient learns our heart is our most vital organ as it circulates oxygen, nutrients, white blood cells, and hormones throughout the entire body, and carries waste away. Data supports a plant-based eating plan like the Pritikin Program for its effectiveness in slowing progression of and reversing heart disease. The video provides a number of recommendations to address heart disease.   Metabolic Syndrome and Belly Fat  Clinical staff conducted group or individual video education with verbal and written material and guidebook.  Patient learns what metabolic syndrome is, how it leads to heart disease, and how one can reverse it and keep it from coming back. You have metabolic syndrome if you have 3 of the following 5 criteria: abdominal obesity, high blood pressure, high triglycerides, low HDL cholesterol, and high blood sugar.  Hypertension and Heart Disease Clinical staff conducted group or individual video education with verbal and written material and guidebook.  Patient learns that high blood pressure, or hypertension, is very common in the [place]. Hypertension is largely due to excessive salt intake, but other important risk factors include being overweight, physical inactivity, drinking too much alcohol, smoking, and not eating enough potassium from fruits and vegetables. High blood pressure is a leading risk factor for heart attack, stroke, congestive heart failure, dementia, kidney failure, and premature death. Long-term effects of excessive salt intake include  stiffening of the arteries and thickening of heart muscle and organ damage. Recommendations include ways to reduce hypertension and the risk of heart disease.  Diseases of Our Time - Focusing on Diabetes Clinical staff conducted group or individual video education with verbal and written material and guidebook.  Patient learns why the best way to stop diseases of our time is prevention, through food and other lifestyle changes. Medicine (such as prescription pills and surgeries) is often only a Band-Aid on the problem, not a long-term solution. Most common diseases of our time include obesity, type 2 diabetes, hypertension, heart disease, and cancer. The Pritikin Program is recommended and has been proven to help reduce, reverse, and/or prevent the damaging effects of metabolic syndrome.  Nutrition   Overview of the Pritikin Eating Plan  Clinical staff conducted group or individual video education with verbal and written material and guidebook.  Patient learns about the Pritikin Eating Plan for disease risk reduction. The Pritikin Eating Plan emphasizes a wide variety of unrefined, minimally-processed carbohydrates, like fruits, vegetables, whole grains, and legumes. Go, Caution, and Stop  food choices are explained. Plant-based and lean animal proteins are emphasized. Rationale provided for low sodium intake for blood pressure control, low added sugars for blood sugar stabilization, and low added fats and oils for coronary artery disease risk reduction and weight management.  Calorie Density  Clinical staff conducted group or individual video education with verbal and written material and guidebook.  Patient learns about calorie density and how it impacts the Pritikin Eating Plan. Knowing the characteristics of the food you choose will help you decide whether those foods will lead to weight gain or weight loss, and whether you want to consume more or less of them. Weight loss is usually a side effect of  the Pritikin Eating Plan because of its focus on low calorie-dense foods.  Label Reading  Clinical staff conducted group or individual video education with verbal and written material and guidebook.  Patient learns about the Pritikin recommended label reading guidelines and corresponding recommendations regarding calorie density, added sugars, sodium content, and whole grains.  Dining Out - Part 1  Clinical staff conducted group or individual video education with verbal and written material and guidebook.  Patient learns that restaurant meals can be sabotaging because they can be so high in calories, fat, sodium, and/or sugar. Patient learns recommended strategies on how to positively address this and avoid unhealthy pitfalls.  Facts on Fats  Clinical staff conducted group or individual video education with verbal and written material and guidebook.  Patient learns that lifestyle modifications can be just as effective, if not more so, as many medications for lowering your risk of heart disease. A Pritikin lifestyle can help to reduce your risk of inflammation and atherosclerosis (cholesterol build-up, or plaque, in the artery walls). Lifestyle interventions such as dietary choices and physical activity address the cause of atherosclerosis. A review of the types of fats and their impact on blood cholesterol levels, along with dietary recommendations to reduce fat intake is also included.  Nutrition Action Plan  Clinical staff conducted group or individual video education with verbal and written material and guidebook.  Patient learns how to incorporate Pritikin recommendations into their lifestyle. Recommendations include planning and keeping personal health goals in mind as an important part of their success.  Healthy Mind-Set    Healthy Minds, Bodies, Hearts  Clinical staff conducted group or individual video education with verbal and written material and guidebook.  Patient learns how to  identify when they are stressed. Video will discuss the impact of that stress, as well as the many benefits of stress management. Patient will also be introduced to stress management techniques. The way we think, act, and feel has an impact on our hearts.  How Our Thoughts Can Heal Our Hearts  Clinical staff conducted group or individual video education with verbal and written material and guidebook.  Patient learns that negative thoughts can cause depression and anxiety. This can result in negative lifestyle behavior and serious health problems. Cognitive behavioral therapy is an effective method to help control our thoughts in order to change and improve our emotional outlook.  Additional Videos:  Exercise    Improving Performance  Clinical staff conducted group or individual video education with verbal and written material and guidebook.  Patient learns to use a non-linear approach by alternating intensity levels and lengths of time spent exercising to help burn more calories and lose more body fat. Cardiovascular exercise helps improve heart health, metabolism, hormonal balance, blood sugar control, and recovery from fatigue. Resistance training improves strength,  endurance, balance, coordination, reaction time, metabolism, and muscle mass. Flexibility exercise improves circulation, posture, and balance. Seek guidance from your physician and exercise physiologist before implementing an exercise routine and learn your capabilities and proper form for all exercise.  Introduction to Yoga  Clinical staff conducted group or individual video education with verbal and written material and guidebook.  Patient learns about yoga, a discipline of the coming together of mind, breath, and body. The benefits of yoga include improved flexibility, improved range of motion, better posture and core strength, increased lung function, weight loss, and positive self-image. Yoga's heart health benefits include lowered  blood pressure, healthier heart rate, decreased cholesterol and triglyceride levels, improved immune function, and reduced stress. Seek guidance from your physician and exercise physiologist before implementing an exercise routine and learn your capabilities and proper form for all exercise.  Medical   Aging: Enhancing Your Quality of Life  Clinical staff conducted group or individual video education with verbal and written material and guidebook.  Patient learns key strategies and recommendations to stay in good physical health and enhance quality of life, such as prevention strategies, having an advocate, securing a Health Care Proxy and Power of Attorney, and keeping a list of medications and system for tracking them. It also discusses how to avoid risk for bone loss.  Biology of Weight Control  Clinical staff conducted group or individual video education with verbal and written material and guidebook.  Patient learns that weight gain occurs because we consume more calories than we burn (eating more, moving less). Even if your body weight is normal, you may have higher ratios of fat compared to muscle mass. Too much body fat puts you at increased risk for cardiovascular disease, heart attack, stroke, type 2 diabetes, and obesity-related cancers. In addition to exercise, following the Pritikin Eating Plan can help reduce your risk.  Decoding Lab Results  Clinical staff conducted group or individual video education with verbal and written material and guidebook.  Patient learns that lab test reflects one measurement whose values change over time and are influenced by many factors, including medication, stress, sleep, exercise, food, hydration, pre-existing medical conditions, and more. It is recommended to use the knowledge from this video to become more involved with your lab results and evaluate your numbers to speak with your doctor.   Diseases of Our Time - Overview  Clinical staff conducted  group or individual video education with verbal and written material and guidebook.  Patient learns that according to the CDC, 50% to 70% of chronic diseases (such as obesity, type 2 diabetes, elevated lipids, hypertension, and heart disease) are avoidable through lifestyle improvements including healthier food choices, listening to satiety cues, and increased physical activity.  Sleep Disorders Clinical staff conducted group or individual video education with verbal and written material and guidebook.  Patient learns how good quality and duration of sleep are important to overall health and well-being. Patient also learns about sleep disorders and how they impact health along with recommendations to address them, including discussing with a physician.  Nutrition  Dining Out - Part 2 Clinical staff conducted group or individual video education with verbal and written material and guidebook.  Patient learns how to plan ahead and communicate in order to maximize their dining experience in a healthy and nutritious manner. Included are recommended food choices based on the type of restaurant the patient is visiting.   Fueling a Banker conducted group or individual video education with verbal  and written material and guidebook.  There is a strong connection between our food choices and our health. Diseases like obesity and type 2 diabetes are very prevalent and are in large-part due to lifestyle choices. The Pritikin Eating Plan provides plenty of food and hunger-curbing satisfaction. It is easy to follow, affordable, and helps reduce health risks.  Menu Workshop  Clinical staff conducted group or individual video education with verbal and written material and guidebook.  Patient learns that restaurant meals can sabotage health goals because they are often packed with calories, fat, sodium, and sugar. Recommendations include strategies to plan ahead and to communicate with the  manager, chef, or server to help order a healthier meal.  Planning Your Eating Strategy  Clinical staff conducted group or individual video education with verbal and written material and guidebook.  Patient learns about the Pritikin Eating Plan and its benefit of reducing the risk of disease. The Pritikin Eating Plan does not focus on calories. Instead, it emphasizes high-quality, nutrient-rich foods. By knowing the characteristics of the foods, we choose, we can determine their calorie density and make informed decisions.  Targeting Your Nutrition Priorities  Clinical staff conducted group or individual video education with verbal and written material and guidebook.  Patient learns that lifestyle habits have a tremendous impact on disease risk and progression. This video provides eating and physical activity recommendations based on your personal health goals, such as reducing LDL cholesterol, losing weight, preventing or controlling type 2 diabetes, and reducing high blood pressure.  Vitamins and Minerals  Clinical staff conducted group or individual video education with verbal and written material and guidebook.  Patient learns different ways to obtain key vitamins and minerals, including through a recommended healthy diet. It is important to discuss all supplements you take with your doctor.   Healthy Mind-Set    Smoking Cessation  Clinical staff conducted group or individual video education with verbal and written material and guidebook.  Patient learns that cigarette smoking and tobacco addiction pose a serious health risk which affects millions of people. Stopping smoking will significantly reduce the risk of heart disease, lung disease, and many forms of cancer. Recommended strategies for quitting are covered, including working with your doctor to develop a successful plan.  Culinary   Becoming a Set designer conducted group or individual video education with verbal and  written material and guidebook.  Patient learns that cooking at home can be healthy, cost-effective, quick, and puts them in control. Keys to cooking healthy recipes will include looking at your recipe, assessing your equipment needs, planning ahead, making it simple, choosing cost-effective seasonal ingredients, and limiting the use of added fats, salts, and sugars.  Cooking - Breakfast and Snacks  Clinical staff conducted group or individual video education with verbal and written material and guidebook.  Patient learns how important breakfast is to satiety and nutrition through the entire day. Recommendations include key foods to eat during breakfast to help stabilize blood sugar levels and to prevent overeating at meals later in the day. Planning ahead is also a key component.  Cooking - Educational psychologist conducted group or individual video education with verbal and written material and guidebook.  Patient learns eating strategies to improve overall health, including an approach to cook more at home. Recommendations include thinking of animal protein as a side on your plate rather than center stage and focusing instead on lower calorie dense options like vegetables, fruits, whole grains, and plant-based  proteins, such as beans. Making sauces in large quantities to freeze for later and leaving the skin on your vegetables are also recommended to maximize your experience.  Cooking - Healthy Salads and Dressing Clinical staff conducted group or individual video education with verbal and written material and guidebook.  Patient learns that vegetables, fruits, whole grains, and legumes are the foundations of the Pritikin Eating Plan. Recommendations include how to incorporate each of these in flavorful and healthy salads, and how to create homemade salad dressings. Proper handling of ingredients is also covered. Cooking - Soups and [employer] - Soups and Desserts Clinical staff  conducted group or individual video education with verbal and written material and guidebook.  Patient learns that Pritikin soups and desserts make for easy, nutritious, and delicious snacks and meal components that are low in sodium, fat, sugar, and calorie density, while high in vitamins, minerals, and filling fiber. Recommendations include simple and healthy ideas for soups and desserts.   Overview     The Pritikin Solution Program Overview Clinical staff conducted group or individual video education with verbal and written material and guidebook.  Patient learns that the results of the Pritikin Program have been documented in more than 100 articles published in peer-reviewed journals, and the benefits include reducing risk factors for (and, in some cases, even reversing) high cholesterol, high blood pressure, type 2 diabetes, obesity, and more! An overview of the three key pillars of the Pritikin Program will be covered: eating well, doing regular exercise, and having a healthy mind-set.  WORKSHOPS  Exercise: Exercise Basics: Building Your Action Plan Clinical staff led group instruction and group discussion with PowerPoint presentation and patient guidebook. To enhance the learning environment the use of posters, models and videos may be added. At the conclusion of this workshop, patients will comprehend the difference between physical activity and exercise, as well as the benefits of incorporating both, into their routine. Patients will understand the FITT (Frequency, Intensity, Time, and Type) principle and how to use it to build an exercise action plan. In addition, safety concerns and other considerations for exercise and cardiac rehab will be addressed by the presenter. The purpose of this lesson is to promote a comprehensive and effective weekly exercise routine in order to improve patients' overall level of fitness.   Managing Heart Disease: Your Path to a Healthier Heart Clinical  staff led group instruction and group discussion with PowerPoint presentation and patient guidebook. To enhance the learning environment the use of posters, models and videos may be added.At the conclusion of this workshop, patients will understand the anatomy and physiology of the heart. Additionally, they will understand how Pritikin's three pillars impact the risk factors, the progression, and the management of heart disease.  The purpose of this lesson is to provide a high-level overview of the heart, heart disease, and how the Pritikin lifestyle positively impacts risk factors.  Exercise Biomechanics Clinical staff led group instruction and group discussion with PowerPoint presentation and patient guidebook. To enhance the learning environment the use of posters, models and videos may be added. Patients will learn how the structural parts of their bodies function and how these functions impact their daily activities, movement, and exercise. Patients will learn how to promote a neutral spine, learn how to manage pain, and identify ways to improve their physical movement in order to promote healthy living. The purpose of this lesson is to expose patients to common physical limitations that impact physical activity. Participants will learn practical  ways to adapt and manage aches and pains, and to minimize their effect on regular exercise. Patients will learn how to maintain good posture while sitting, walking, and lifting.  Balance Training and Fall Prevention  Clinical staff led group instruction and group discussion with PowerPoint presentation and patient guidebook. To enhance the learning environment the use of posters, models and videos may be added. At the conclusion of this workshop, patients will understand the importance of their sensorimotor skills (vision, proprioception, and the vestibular system) in maintaining their ability to balance as they age. Patients will apply a variety of  balancing exercises that are appropriate for their current level of function. Patients will understand the common causes for poor balance, possible solutions to these problems, and ways to modify their physical environment in order to minimize their fall risk. The purpose of this lesson is to teach patients about the importance of maintaining balance as they age and ways to minimize their risk of falling.  WORKSHOPS   Nutrition:  Fueling a Ship broker led group instruction and group discussion with PowerPoint presentation and patient guidebook. To enhance the learning environment the use of posters, models and videos may be added. Patients will review the foundational principles of the Pritikin Eating Plan and understand what constitutes a serving size in each of the food groups. Patients will also learn Pritikin-friendly foods that are better choices when away from home and review make-ahead meal and snack options. Calorie density will be reviewed and applied to three nutrition priorities: weight maintenance, weight loss, and weight gain. The purpose of this lesson is to reinforce (in a group setting) the key concepts around what patients are recommended to eat and how to apply these guidelines when away from home by planning and selecting Pritikin-friendly options. Patients will understand how calorie density may be adjusted for different weight management goals.  Mindful Eating  Clinical staff led group instruction and group discussion with PowerPoint presentation and patient guidebook. To enhance the learning environment the use of posters, models and videos may be added. Patients will briefly review the concepts of the Pritikin Eating Plan and the importance of low-calorie dense foods. The concept of mindful eating will be introduced as well as the importance of paying attention to internal hunger signals. Triggers for non-hunger eating and techniques for dealing with triggers will be  explored. The purpose of this lesson is to provide patients with the opportunity to review the basic principles of the Pritikin Eating Plan, discuss the value of eating mindfully and how to measure internal cues of hunger and fullness using the Hunger Scale. Patients will also discuss reasons for non-hunger eating and learn strategies to use for controlling emotional eating.  Targeting Your Nutrition Priorities Clinical staff led group instruction and group discussion with PowerPoint presentation and patient guidebook. To enhance the learning environment the use of posters, models and videos may be added. Patients will learn how to determine their genetic susceptibility to disease by reviewing their family history. Patients will gain insight into the importance of diet as part of an overall healthy lifestyle in mitigating the impact of genetics and other environmental insults. The purpose of this lesson is to provide patients with the opportunity to assess their personal nutrition priorities by looking at their family history, their own health history and current risk factors. Patients will also be able to discuss ways of prioritizing and modifying the Pritikin Eating Plan for their highest risk areas  Menu  Clinical staff led  group instruction and group discussion with PowerPoint presentation and patient guidebook. To enhance the learning environment the use of posters, models and videos may be added. Using menus brought in from [employer], or printed from [employer], patients will apply the Pritikin dining out guidelines that were presented in the [employer] video. Patients will also be able to practice these guidelines in a variety of provided scenarios. The purpose of this lesson is to provide patients with the opportunity to practice hands-on learning of the Pritikin Dining Out guidelines with actual menus and practice scenarios.  Label Reading Clinical staff led group  instruction and group discussion with PowerPoint presentation and patient guidebook. To enhance the learning environment the use of posters, models and videos may be added. Patients will review and discuss the Pritikin label reading guidelines presented in [employer]'s Label Reading Educational series video. Using fool labels brought in from local grocery stores and markets, patients will apply the label reading guidelines and determine if the packaged food meet the Pritikin guidelines. The purpose of this lesson is to provide patients with the opportunity to review, discuss, and practice hands-on learning of the [employer] Label Reading guidelines with actual packaged food labels. Cooking School  [employer]'s [employer] are designed to teach patients ways to prepare quick, simple, and affordable recipes at home. The importance of nutrition's role in chronic disease risk reduction is reflected in its emphasis in the overall [employer]in program. By learning how to prepare essential core [employer] Eating Plan recipes, patients will increase control over what they eat; be able to customize the flavor of foods without the use of added salt, sugar, or fat; and improve the quality of the food they consume. By learning a set of core recipes which are easily assembled, quickly prepared, and affordable, patients are more likely to prepare more healthy foods at home. These workshops focus on convenient breakfasts, simple entres, side dishes, and desserts which can be prepared with minimal effort and are consistent with nutrition recommendations for cardiovascular risk reduction. Cooking [employer] are taught by a Armed forces logistics/support/administrative officer (RD) who has been trained by the [employer]. The chef or RD has a clear understanding of the importance of minimizing - if not completely eliminating - added fat, sugar, and sodium in recipes. Throughout the series of Cooking School Workshop sessions, patients  will learn about healthy ingredients and efficient methods of cooking to build confidence in their capability to prepare    Cooking School weekly topics:  Adding Flavor- Sodium-Free  Fast and Healthy Breakfasts  Powerhouse Plant-Based Proteins  Satisfying Salads and Dressings  Simple Sides and Sauces  International Cuisine-Spotlight on the [employer] Zones  Delicious Desserts  Savory Soups  [employer] - Meals in a Astronomer Appetizers and Snacks  Comforting Weekend Breakfasts  One-Pot Wonders   Fast Evening Meals  Landscape architect Your Pritikin Plate  WORKSHOPS   Healthy Mindset (Psychosocial):  Focused Goals, Sustainable Changes Clinical staff led group instruction and group discussion with PowerPoint presentation and patient guidebook. To enhance the learning environment the use of posters, models and videos may be added. Patients will be able to apply effective goal setting strategies to establish at least one personal goal, and then take consistent, meaningful action toward that goal. They will learn to identify common barriers to achieving personal goals and develop strategies to overcome them. Patients will also gain an understanding of how our mind-set can impact our ability  to achieve goals and the importance of cultivating a positive and growth-oriented mind-set. The purpose of this lesson is to provide patients with a deeper understanding of how to set and achieve personal goals, as well as the tools and strategies needed to overcome common obstacles which may arise along the way.  From Head to Heart: The Power of a Healthy Outlook  Clinical staff led group instruction and group discussion with PowerPoint presentation and patient guidebook. To enhance the learning environment the use of posters, models and videos may be added. Patients will be able to recognize and describe the impact of emotions and mood on physical health. They will discover the importance  of self-care and explore self-care practices which may work for them. Patients will also learn how to utilize the 4 C's to cultivate a healthier outlook and better manage stress and challenges. The purpose of this lesson is to demonstrate to patients how a healthy outlook is an essential part of maintaining good health, especially as they continue their cardiac rehab journey.  Healthy Sleep for a Healthy Heart Clinical staff led group instruction and group discussion with PowerPoint presentation and patient guidebook. To enhance the learning environment the use of posters, models and videos may be added. At the conclusion of this workshop, patients will be able to demonstrate knowledge of the importance of sleep to overall health, well-being, and quality of life. They will understand the symptoms of, and treatments for, common sleep disorders. Patients will also be able to identify daytime and nighttime behaviors which impact sleep, and they will be able to apply these tools to help manage sleep-related challenges. The purpose of this lesson is to provide patients with a general overview of sleep and outline the importance of quality sleep. Patients will learn about a few of the most common sleep disorders. Patients will also be introduced to the concept of "sleep hygiene," and discover ways to self-manage certain sleeping problems through simple daily behavior changes. Finally, the workshop will motivate patients by clarifying the links between quality sleep and their goals of heart-healthy living.   Recognizing and Reducing Stress Clinical staff led group instruction and group discussion with PowerPoint presentation and patient guidebook. To enhance the learning environment the use of posters, models and videos may be added. At the conclusion of this workshop, patients will be able to understand the types of stress reactions, differentiate between acute and chronic stress, and recognize the impact that  chronic stress has on their health. They will also be able to apply different coping mechanisms, such as reframing negative self-talk. Patients will have the opportunity to practice a variety of stress management techniques, such as deep abdominal breathing, progressive muscle relaxation, and/or guided imagery.  The purpose of this lesson is to educate patients on the role of stress in their lives and to provide healthy techniques for coping with it.  Learning Barriers/Preferences:  Learning Barriers/Preferences - 09/02/22 0841       Learning Barriers/Preferences   Learning Barriers Sight   glasses   Learning Preferences Audio;Computer/Internet;Group Instruction;Individual Instruction;Skilled Demonstration;Pictoral;Verbal Instruction;Video;Written Material             Education Topics:  Knowledge Questionnaire Score:  Knowledge Questionnaire Score - 09/02/22 0840       Knowledge Questionnaire Score   Pre Score 21/24             Core Components/Risk Factors/Patient Goals at Admission:  Personal Goals and Risk Factors at Admission - 09/02/22 1610  Core Components/Risk Factors/Patient Goals on Admission    Weight Management Yes;Obesity;Weight Loss    Intervention Weight Management: Provide education and appropriate resources to help participant work on and attain dietary goals.;Weight Management: Develop a combined nutrition and exercise program designed to reach desired caloric intake, while maintaining appropriate intake of nutrient and fiber, sodium and fats, and appropriate energy expenditure required for the weight goal.;Weight Management/Obesity: Establish reasonable short term and long term weight goals.;Obesity: Provide education and appropriate resources to help participant work on and attain dietary goals.    Admit Weight 200 lb 9.9 oz (91 kg)    Expected Outcomes Short Term: Continue to assess and modify interventions until short term weight is achieved;Long Term:  Adherence to nutrition and physical activity/exercise program aimed toward attainment of established weight goal;Weight Loss: Understanding of general recommendations for a balanced deficit meal plan, which promotes 1-2 lb weight loss per week and includes a negative energy balance of [phone] kcal/d;Understanding recommendations for meals to include 15-35% energy as protein, 25-35% energy from fat, 35-60% energy from carbohydrates, less than 200mg  of dietary cholesterol, 20-35 gm of total fiber daily;Understanding of distribution of calorie intake throughout the day with the consumption of 4-5 meals/snacks    Diabetes Yes    Intervention Provide education about signs/symptoms and action to take for hypo/hyperglycemia.;Provide education about proper nutrition, including hydration, and aerobic/resistive exercise prescription along with prescribed medications to achieve blood glucose in normal ranges: Fasting glucose 65-99 mg/dL    Expected Outcomes Short Term: Participant verbalizes understanding of the signs/symptoms and immediate care of hyper/hypoglycemia, proper foot care and importance of medication, aerobic/resistive exercise and nutrition plan for blood glucose control.;Long Term: Attainment of HbA1C < 7%.    Hypertension Yes    Intervention Provide education on lifestyle modifcations including regular physical activity/exercise, weight management, moderate sodium restriction and increased consumption of fresh fruit, vegetables, and low fat dairy, alcohol moderation, and smoking cessation.;Monitor prescription use compliance.    Expected Outcomes Short Term: Continued assessment and intervention until BP is < 140/42mm HG in hypertensive participants. < 130/64mm HG in hypertensive participants with diabetes, heart failure or chronic kidney disease.;Long Term: Maintenance of blood pressure at goal levels.    Lipids Yes    Intervention Provide education and support for participant on nutrition &  aerobic/resistive exercise along with prescribed medications to achieve LDL 70mg , HDL >40mg .    Expected Outcomes Short Term: Participant states understanding of desired cholesterol values and is compliant with medications prescribed. Participant is following exercise prescription and nutrition guidelines.;Long Term: Cholesterol controlled with medications as prescribed, with individualized exercise RX and with personalized nutrition plan. Value goals: LDL < 70mg , HDL > 40 mg.    Stress Yes    Intervention Offer individual and/or small group education and counseling on adjustment to heart disease, stress management and health-related lifestyle change. Teach and support self-help strategies.;Refer participants experiencing significant psychosocial distress to appropriate mental health specialists for further evaluation and treatment. When possible, include family members and significant others in education/counseling sessions.    Expected Outcomes Short Term: Participant demonstrates changes in health-related behavior, relaxation and other stress management skills, ability to obtain effective social support, and compliance with psychotropic medications if prescribed.;Long Term: Emotional wellbeing is indicated by absence of clinically significant psychosocial distress or social isolation.             Core Components/Risk Factors/Patient Goals Review:   Goals and Risk Factor Review     Row Name 09/20/22 1004  Core Components/Risk Factors/Patient Goals Review   Personal Goals Review Weight Management/Obesity;Diabetes;Hypertension;Lipids;Stress       Review Heidemarie is off to a good start to exercise at cardiac rehab. Vital signs and CBG's have been stable       Expected Outcomes Nanette will continue to participate in cardiac rehab for exercise, nutrition and lifestyle modifications.                Core Components/Risk Factors/Patient Goals at Discharge (Final Review):   Goals  and Risk Factor Review - 09/20/22 1004       Core Components/Risk Factors/Patient Goals Review   Personal Goals Review Weight Management/Obesity;Diabetes;Hypertension;Lipids;Stress    Review Saddie is off to a good start to exercise at cardiac rehab. Vital signs and CBG's have been stable    Expected Outcomes Sagal will continue to participate in cardiac rehab for exercise, nutrition and lifestyle modifications.             ITP Comments:  ITP Comments     Row Name 09/02/22 [phone] 09/20/22 0955         ITP Comments Dr. Armanda Magic medical director. Introduction to pritikin education/ intensive cardiac rehab. Initial orientation packet reviewed with patient. 30 Day ITP Review. Kailynne started cardiac rehab on 09/09/22 and is off to a good start to exercise.               Comments: See ITP comments.Thayer Headings RN BSN

## 2022-09-23 ENCOUNTER — Encounter (HOSPITAL_COMMUNITY): Admission: RE | Admit: 2022-09-23 | Payer: 59 | Source: Ambulatory Visit

## 2022-09-23 DIAGNOSIS — Z951 Presence of aortocoronary bypass graft: Secondary | ICD-10-CM | POA: Diagnosis not present

## 2022-09-25 ENCOUNTER — Encounter (HOSPITAL_COMMUNITY)
Admission: RE | Admit: 2022-09-25 | Discharge: 2022-09-25 | Disposition: A | Discharge status: Home / Self Care | Payer: 59 | Source: Ambulatory Visit | Attending: Cardiology | Admitting: Cardiology

## 2022-09-25 DIAGNOSIS — Z951 Presence of aortocoronary bypass graft: Secondary | ICD-10-CM | POA: Diagnosis not present

## 2022-09-27 ENCOUNTER — Telehealth (HOSPITAL_COMMUNITY): Payer: Self-pay

## 2022-09-27 ENCOUNTER — Encounter: Payer: Self-pay | Admitting: Cardiology

## 2022-09-27 ENCOUNTER — Ambulatory Visit: Payer: 59 | Admitting: Cardiology

## 2022-09-27 ENCOUNTER — Encounter (HOSPITAL_COMMUNITY): Payer: 59

## 2022-09-27 VITALS — BP 94/62 | HR 92 | Resp 16 | Ht 63.0 in | Wt 193.0 lb

## 2022-09-27 DIAGNOSIS — I2510 Atherosclerotic heart disease of native coronary artery without angina pectoris: Secondary | ICD-10-CM

## 2022-09-27 DIAGNOSIS — I951 Orthostatic hypotension: Secondary | ICD-10-CM

## 2022-09-27 DIAGNOSIS — G8929 Other chronic pain: Secondary | ICD-10-CM

## 2022-09-27 DIAGNOSIS — E1122 Type 2 diabetes mellitus with diabetic chronic kidney disease: Secondary | ICD-10-CM

## 2022-09-27 MED ORDER — TRAMADOL HCL 50 MG PO TABS
50.0000 mg | ORAL_TABLET | Freq: Three times a day (TID) | ORAL | 1 refills | Status: DC | PRN
Start: 2022-09-27 — End: 2022-11-23

## 2022-09-27 NOTE — Telephone Encounter (Signed)
Pt called and left a vm that she is not coming to cardiac rehab today due to a severe UTI and is going to urgent care instead!

## 2022-09-27 NOTE — Progress Notes (Signed)
Primary Physician/Referring:  Irena Reichmann, DO  Patient ID: Kayla Berger, female    DOB: 08-04-1960, 62 y.o.   MRN: [id number]  Chief Complaint  Patient presents with   Coronary Artery Disease   Follow-up    6 week   Dizziness   HPI:    Kayla Berger  is a 62 y.o. Patient with hypertension, hyperlipidemia, diabetes mellitus, decreased exercise tolerance, underwent cardiac catheterization on 04/05/2022 revealing calcific high-grade stenosis in D1 and also at least a 70 to 80% stenosis in the mid LAD.  She underwent CABG x 2 with free RIMA to mid LAD and T graft to D1 on 07/26/2022 at [facility].  She is presently doing well and has recuperated well, I had started her on Rybelsus on her last office visit and also aspirin and Plavix, which she is tolerating.  She has not had any complications from the medications and has lost about 8 pounds in weight.  Except for dizziness she has no specific complaints.  No fever or chills.  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/2022   Shoulder pain    Thyroid disease    Trochanteric bursitis of left hip    Vaginal cysts    4 have been removed   Past Surgical History:  Procedure Laterality Date   APPENDECTOMY  1967   ARTERIAL BYPASS SURGRY  07/18/2022   CARDIAC CATHETERIZATION Bilateral    cataracts   CHOLECYSTECTOMY N/A 02/16/2021   Procedure: LAPAROSCOPIC CHOLECYSTECTOMY;  Surgeon: [name], MD;  Location: [facility] OR;  Service:  General;  Laterality: N/A;   CORONARY PRESSURE/FFR STUDY N/A 04/05/2022   Procedure: INTRAVASCULAR PRESSURE WIRE/FFR STUDY;  Surgeon: Yates Decamp, MD;  Location: [facility] INV[facility] CV [facility];  Service: Cardiovascular;  Laterality: N/A;   KYPHOPLASTY N/A 10/31/2016   Procedure: KYPHOPLASTY LUMBAR ONE, LUMBAR TWO AND  LUMBAR THREE;  Surgeon: Venita Lick, MD;  Location: [facility] OR;  Service: Orthopedics;  Laterality: N/A;   LEFT HEART CATH AND CORONARY ANGIOGRAPHY N/A 04/05/2022   Procedure: LEFT HEART CATH AND CORONARY ANGIOGRAPHY;  Surgeon: Yates Decamp, MD;  Location: [facility] IN[facility] CV [facility];  Service: Cardiovascular;  Laterality: N/A;   nsvd  2001   normal vaginal del   sonobysterogram, laporoscopy  1997   fertility test   Social History   Tobacco Use   Smoking status: Never   Smokeless tobacco: Never  Substance Use Topics   Alcohol use: No   Marital Status: Divorced  ROS  Review of Systems  Cardiovascular:  Negative for chest pain, dyspnea on exertion and leg swelling.  Neurological:  Positive for dizziness.   Objective  Blood pressure 94/62, pulse 92, resp. rate 16, height 5\' 3"  (1.6 m), weight 193 lb (87.5 kg), last menstrual period 10/06/2012, SpO2 97%. Body mass index is 34.19 kg/m.  09/27/2022    1:15 PM 09/02/2022    8:29 AM 08/14/2022   11:12 AM  Vitals with BMI  Height 5\' 3"  5\' 3"  5\' 3"   Weight 193 lbs 200 lbs 10 oz 205 lbs 3 oz  BMI 34.2 35.55 36.36  Systolic 94 110 103  Diastolic 62 74 66  Pulse 92 95 93    Orthostatic VS for the past 72 hrs (Last 3 readings):  Orthostatic BP Patient Position BP Location Cuff Size Orthostatic Pulse  09/27/22 1325 (!) 83/59 Standing Left Arm Large 91  09/27/22 1324 102/67 Sitting Left Arm Large 85  09/27/22 1323 120/76 Supine Left Arm Large 83    Physical Exam Constitutional:      Appearance: She is obese.  Neck:     Vascular: No JVD.  Cardiovascular:     Rate and Rhythm: Normal rate and regular rhythm.     Pulses: Intact distal pulses.      Heart sounds: Normal heart sounds. No murmur heard.    No gallop.  Pulmonary:     Effort: Pulmonary effort is normal.     Breath sounds: Normal breath sounds.  Abdominal:     General: Bowel sounds are normal.     Palpations: Abdomen is soft.  Musculoskeletal:     Right lower leg: No edema.     Left lower leg: No edema.    Medications and allergies   Allergies  Allergen Reactions   Hydrocodone Itching, Swelling and Other (See Comments)    Numbness of the face/hands and tingling also     Medication list after today's encounter   Current Outpatient Medications:    Alpha Lipoic Acid 200 MG CAPS, Take 200 mg by mouth at bedtime., Disp: , Rfl:    aspirin 81 MG chewable tablet, Chew 81 mg by mouth in the morning., Disp: , Rfl:    atorvastatin (LIPITOR) 40 MG tablet, Take 40 mg by mouth at bedtime., Disp: , Rfl:    Biotin 09604 MCG TABS, Take 10,000 mcg by mouth every evening., Disp: , Rfl:    cetirizine (ZYRTEC) 10 MG tablet, Take 10 mg by mouth in the morning., Disp: , Rfl:    Cholecalciferol 25 MCG (1000 UT) capsule, Take 1,000 Units by mouth 2 (two) times daily., Disp: , Rfl:    clopidogrel (PLAVIX) 75 MG tablet, Take 1 tablet (75 mg total) by mouth daily., Disp: 90 tablet, Rfl: 1   CRANBERRY PO, Take 15,000 mg by mouth in the morning., Disp: , Rfl:    denosumab (PROLIA) 60 MG/ML SOSY injection, Inject 60 mg into the skin every 6 (six) months., Disp: , Rfl:    DULoxetine (CYMBALTA) 60 MG capsule, Take 60 mg by mouth 2 (two) times daily., Disp: , Rfl:    furosemide (LASIX) 20 MG tablet, TAKE 1 TABLET BY MOUTH ONCE DAILY AS NEEDED FOR FLUID, Disp: 30 tablet, Rfl: 0   gabapentin (NEURONTIN) 800 MG tablet, Take 400 mg by mouth at bedtime., Disp: , Rfl:    HUMALOG KWIKPEN 100 UNIT/ML KwikPen, Inject 5-12 Units into the skin 3 (three) times daily as needed (for a BGL greater than 100)., Disp: , Rfl:    insulin glargine, 2 Unit Dial, (TOUJEO MAX SOLOSTAR) 300 UNIT/ML Solostar Pen,  Inject 10 Units into the skin at bedtime. (Patient taking differently: Inject 45 Units into the skin at bedtime.), Disp: , Rfl:    metoprolol tartrate (LOPRESSOR) 25 MG tablet, Take 1 tablet by mouth twice daily, Disp: 180 tablet,  Rfl: 0   Multiple Vitamins-Minerals (CENTRUM WOMEN) TABS, Take 1 tablet by mouth daily with breakfast., Disp: , Rfl:    Nutritional Supplements (EQUATE PO), Take by mouth., Disp: , Rfl:    pantoprazole (PROTONIX) 40 MG tablet, Take 1 tablet (40 mg total) by mouth 2 (two) times daily. (Patient taking differently: Take 40 mg by mouth daily.), Disp: 60 tablet, Rfl: 0   polyethylene glycol powder (GLYCOLAX/MIRALAX) 17 GM/SCOOP powder, Take 1 Container by mouth once., Disp: , Rfl:    potassium chloride (KLOR-CON) 10 MEQ tablet, TAKE 1 TABLET BY MOUTH ONCE DAILY AS NEEDED WITH FUROSEMIDE ONLY, Disp: 30 tablet, Rfl: 0   RYBELSUS 3 MG TABS, Take 1 tablet by mouth daily., Disp: , Rfl:    traMADol (ULTRAM) 50 MG tablet, Take 1 tablet (50 mg total) by mouth 3 (three) times daily as needed for severe pain (Back pain)., Disp: 90 tablet, Rfl: 1   zinc gluconate 50 MG tablet, Take 50 mg by mouth in the morning., Disp: , Rfl:   Laboratory examination:   Lab Results  Component Value Date   NA 146 (H) 05/09/2022   K 4.4 05/09/2022   CO2 23 05/09/2022   GLUCOSE 76 05/09/2022   BUN 24 05/09/2022   CREATININE 1.09 (H) 05/09/2022   CALCIUM 9.4 05/09/2022   EGFR 58 (L) 05/09/2022   GFRNONAA 50 (L) 01/19/2022       Latest Ref Rng & Units 05/09/2022    8:11 AM 03/28/2022    8:11 AM 01/19/2022    5:28 PM  CMP  Glucose 70 - 99 mg/dL 76  161  096   BUN 8 - 27 mg/dL 24  23  31    Creatinine 0.57 - 1.00 mg/dL 0.45  4.09  8.11   Sodium 134 - 144 mmol/L 146  145  141   Potassium 3.5 - 5.2 mmol/L 4.4  4.5  4.5   Chloride 96 - 106 mmol/L 107  106  103   CO2 20 - 29 mmol/L 23  26    Calcium 8.7 - 10.3 mg/dL 9.4  9.4        Latest Ref Rng & Units 03/28/2022    8:11 AM 01/19/2022     5:28 PM 01/19/2022    5:15 PM  CBC  WBC 3.4 - 10.8 x10E3/uL 8.1   6.9   Hemoglobin 11.1 - 15.9 g/dL 91.4  78.2  95.6   Hematocrit 34.0 - 46.6 % 40.7  38.0  38.1   Platelets 150 - 450 x10E3/uL 238   220    HEMOGLOBIN A1C Lab Results  Component Value Date   HGBA1C 6.6 (H) 08/04/2021   MPG 142.72 08/04/2021   TSH No results for input(s): "TSH" in the last 8760 hours.  External labs:  Labs 06/12/2022:  Albumin to creatinine ratio 36.  Total cholesterol 01/28/1938, triglycerides 126, HDL 47, LDL 71.  A1c 7.0%.  Serum glucose 100 mg, BUN 18, creatinine 1.36, EGFR 44 mL, potassium 4.7, LFTs normal.  Labs 08/23/2021:  Hb 9.3/HCT 28.7, platelets 307.  Normal indicis.  BUN 45, creatinine 1.51, EGFR 37 mL, potassium 4.7, LFTs normal.  A1c 7.6%.  TSH normal at 3.97.  Vitamin D 55.7.  Labs 04/12/2021:  Total cholesterol 121, triglycerides 145, HDL 40, LDL 52.  Radiology:   CT chest and abdomen 01/19/2022: Cardiovascular: There is no cardiomegaly or pericardial effusion. Advanced coronary vascular calcification of the LAD.  Mild atherosclerotic calcification of the thoracic aorta. No aneurysmal dilatation or dissection.  The central pulmonary arteries appear unremarkable.  Cardiac Studies:   PCV MYOCARDIAL PERFUSION WO LEXISCAN 09/12/2021  Narrative Exercise nuclear stress test 09/12/21 Myocardial perfusion is abnormal. Overall LV systolic function is normal without regional wall motion abnormalities. Intermediate risk study. There is a reversible mild defect in the inferior and apical regions. Stress LV EF: 51%. Abnormal ECG stress. The patient exercised for 6 minutes and 41 seconds of a Modified Bruce protocol, achieving approximately 8.09 METs and 89% MPHR. The heart rate response was normal. The blood pressure response was normal. No previous exam available for comparison.   Left Heart Catheterization 04/05/22:  LV: 137/3, EDP 20 mmHg.  Ao 01/29/2027/68, mean 96 mmHg.  No  pressure gradient across the aortic valve.  LA 7 mmHg.   RCA: Normal right coronary artery. LM: Large-caliber vessel, smooth and normal. LCx: Large-caliber vessel, mid segment has a 30% stenosis, gives origin to a moderate-sized OM 2 but otherwise small OM 1 and 3. LAD: Severely calcified all the way from the proximal to mid segment.  Gives origin to a moderate to large size diagonal 1.  D1 in the proximal segment has a calcific 90% stenosis.  LAD has tandem severe calcific 80% stenosis.  In some views appear to be 40 to 50%. RFR: LAD RFR performed, at rest 0.87, 30 seconds after 200 mcg IC nitroglycerin RFR was 0.80 suggestive of physiologically significant LAD stenosis.       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   Echocardiogram 07/24/2021: The left ventricular size is normal with normal left ventricular wall thickness. LV ejection  fraction = 60-65%.  The right ventricle is mildly dilated. The right ventricular systolic function is mildly reduced.  The left atrial size is normal.  Right atrial size is normal.  There is aortic valve sclerosis.  There is mild to moderate tricuspid regurgitation.  Estimated right ventricular systolic pressure is 38 mmHg  mildly elevated  Mildly dilated, non-collapsible IVC consistent with an elevated right atrial pressure.  There is no pericardial effusion.    EKG:   EKG 09/27/2022: Normal sinus rhythm at rate of 84 bpm, left atrial enlargement, normal axis.  Poor R progression, cannot exclude anteroseptal infarct 4.  Nonspecific T abnormality.  Low-voltage complexes.  Pulmonary disease pattern.  Compared to 08/14/2022, no significant change.  Assessment     ICD-10-CM   1. Orthostatic hypotension  I95.1 EKG 12-Lead    2. Coronary artery disease due to calcified coronary lesion  I25.10    I25.84     3. Type 2 diabetes mellitus with stage 3b chronic kidney disease, with long-term current use of insulin (HCC)  E11.22     N18.32    Z79.4     4. Chronic midline low back pain with right-sided sciatica  M54.41 traMADol (ULTRAM) 50 MG tablet   G89.29        Orders Placed This Encounter  Procedures   EKG 12-Lead    Meds ordered this encounter  Medications   traMADol (ULTRAM) 50 MG tablet    Sig: Take 1 tablet (50 mg total) by mouth 3 (three) times daily as needed for severe pain (Back pain).    Dispense:  90 tablet    Refill:  1    Medications Discontinued During This Encounter  Medication Reason   amiodarone (PACERONE) 200 MG tablet    EXCEDRIN MIGRAINE 250-250-65 MG tablet    JARDIANCE 10 MG TABS tablet  oxyCODONE (OXY IR/ROXICODONE) 5 MG immediate release tablet    Recommendations:   Kayla Berger is a 62 y.o.  Patient with hypertension, hyperlipidemia, diabetes mellitus, decreased exercise tolerance, underwent cardiac catheterization on 04/05/2022 revealing calcific high-grade stenosis in D1 and also at least a 70 to 80% stenosis in the mid LAD.  She underwent CABG x 2 with free RIMA to mid LAD and T graft to D1 on 07/26/2022 at [facility].  1. Orthostatic hypotension Patient today is orthostatic.  This is due to recurrent UTI and presently just come off of Jardiance due to recurrent UTI.  Advised her to let us know if her blood pressure improves otherwise may have to discontinue beta-blocker therapy.  I also suspect weight loss from Rybelsus which she is tolerating may be also contributing to lower blood pressure.  There is no evidence of pericardial effusion, heart failure.  Advised her to keep herself well-hydrated.  No changes in the EKG.  - EKG 12-Lead  2. Coronary artery disease due to calcified coronary lesion Calcific coronary disease, needs aggressive risk modification.  Fortunately lipids are well-controlled, diabetes is also controlled.  3. Type 2 diabetes mellitus with stage 3b chronic kidney disease, with long-term current use of insulin (HCC) I would prefer her to be on  losartan in view of diabetes mellitus and renal disease at least on a small dose of 25 mg.  However since her blood pressure is soft, advised her to call us back when her UTI is resolved and she is feeling better and blood pressure is stable at >120 mmHg and agreed to initiate losartan.  She is now tolerating Rybelsus, if blood pressure remains stable, we could consider increasing the dose to 7 mg daily if weight loss plateaus.  4. Chronic midline low back pain with right-sided sciatica Patient has had chronic midline low back pain and surgery has been put off due to underlying coronary disease, she is seeing orthopedics however she has not been able to take NSAIDs due to surgery.  I will prescribe her tramadol for now.  Otherwise stable from cardiac standpoint, would like to see her back in 3 months and if stable on an annual basis.  - traMADol (ULTRAM) 50 MG tablet; Take 1 tablet (50 mg total) by mouth 3 (three) times daily as needed for severe pain (Back pain).  Dispense: 90 tablet; Refill: 1  Other orders - denosumab (PROLIA) 60 MG/ML SOSY injection; Inject 60 mg into the skin every 6 (six) months.    Yates Decamp, MD, [facility] 09/27/2022, 5:02 PM Office: [phone]

## 2022-10-02 ENCOUNTER — Encounter (HOSPITAL_COMMUNITY)
Admission: RE | Admit: 2022-10-02 | Discharge: 2022-10-02 | Disposition: A | Discharge status: Home / Self Care | Payer: 59 | Source: Ambulatory Visit | Attending: Cardiology | Admitting: Cardiology

## 2022-10-02 DIAGNOSIS — Z951 Presence of aortocoronary bypass graft: Secondary | ICD-10-CM | POA: Insufficient documentation

## 2022-10-04 ENCOUNTER — Encounter (HOSPITAL_COMMUNITY)
Admission: RE | Admit: 2022-10-04 | Discharge: 2022-10-04 | Disposition: A | Discharge status: Home / Self Care | Payer: 59 | Source: Ambulatory Visit | Attending: Cardiology

## 2022-10-04 DIAGNOSIS — Z951 Presence of aortocoronary bypass graft: Secondary | ICD-10-CM

## 2022-10-07 ENCOUNTER — Encounter (HOSPITAL_COMMUNITY)
Admission: RE | Admit: 2022-10-07 | Discharge: 2022-10-07 | Disposition: A | Discharge status: Home / Self Care | Payer: 59 | Source: Ambulatory Visit | Attending: Cardiology

## 2022-10-07 DIAGNOSIS — Z951 Presence of aortocoronary bypass graft: Secondary | ICD-10-CM | POA: Diagnosis not present

## 2022-10-09 ENCOUNTER — Encounter (HOSPITAL_COMMUNITY)
Admission: RE | Admit: 2022-10-09 | Discharge: 2022-10-09 | Disposition: A | Discharge status: Home / Self Care | Payer: 59 | Source: Ambulatory Visit | Attending: Cardiology

## 2022-10-09 DIAGNOSIS — Z951 Presence of aortocoronary bypass graft: Secondary | ICD-10-CM | POA: Diagnosis not present

## 2022-10-11 ENCOUNTER — Encounter (HOSPITAL_COMMUNITY)
Admission: RE | Admit: 2022-10-11 | Discharge: 2022-10-11 | Disposition: A | Discharge status: Home / Self Care | Payer: 59 | Source: Ambulatory Visit | Attending: Cardiology | Admitting: Cardiology

## 2022-10-11 DIAGNOSIS — Z951 Presence of aortocoronary bypass graft: Secondary | ICD-10-CM | POA: Diagnosis not present

## 2022-10-11 LAB — GLUCOSE, CAPILLARY
Glucose-Capillary: 187 mg/dL — ABNORMAL HIGH (ref 70–99)
Glucose-Capillary: 154 mg/dL — ABNORMAL HIGH (ref 70–99)

## 2022-10-14 ENCOUNTER — Encounter (HOSPITAL_COMMUNITY)
Admission: RE | Admit: 2022-10-14 | Discharge: 2022-10-14 | Disposition: A | Discharge status: Home / Self Care | Payer: 59 | Source: Ambulatory Visit | Attending: Cardiology | Admitting: Cardiology

## 2022-10-14 DIAGNOSIS — Z951 Presence of aortocoronary bypass graft: Secondary | ICD-10-CM

## 2022-10-16 ENCOUNTER — Encounter (HOSPITAL_COMMUNITY): Payer: 59

## 2022-10-18 ENCOUNTER — Encounter (HOSPITAL_COMMUNITY)
Admission: RE | Admit: 2022-10-18 | Discharge: 2022-10-18 | Disposition: A | Discharge status: Home / Self Care | Payer: 59 | Source: Ambulatory Visit | Attending: Cardiology

## 2022-10-18 DIAGNOSIS — Z951 Presence of aortocoronary bypass graft: Secondary | ICD-10-CM | POA: Diagnosis not present

## 2022-10-21 ENCOUNTER — Encounter (HOSPITAL_COMMUNITY)
Admission: RE | Admit: 2022-10-21 | Discharge: 2022-10-21 | Disposition: A | Discharge status: Home / Self Care | Payer: 59 | Source: Ambulatory Visit | Attending: Cardiology

## 2022-10-21 ENCOUNTER — Other Ambulatory Visit: Payer: Self-pay | Admitting: Cardiology

## 2022-10-21 DIAGNOSIS — Z951 Presence of aortocoronary bypass graft: Secondary | ICD-10-CM

## 2022-10-21 DIAGNOSIS — E8779 Other fluid overload: Secondary | ICD-10-CM

## 2022-10-22 NOTE — Progress Notes (Signed)
Cardiac Individual Treatment Plan  Patient Details  Name: EYVETTE PIPER MRN: [id number] Date of Birth: 03-14-1960 Referring Provider:   Flowsheet Row INTENSIVE CARDIAC REHAB ORIENT from 09/02/2022 in [facility]  Referring Provider Yates Decamp, MD       Initial Encounter Date:  Flowsheet Row INTENSIVE CARDIAC REHAB ORIENT from 09/02/2022 in [facility], [facility]  Date 09/02/22       Visit Diagnosis: S/P CABG x 2  Patient's Home Medications on Admission:  Current Outpatient Medications:    Alpha Lipoic Acid 200 MG CAPS, Take 200 mg by mouth at bedtime., Disp: , Rfl:    aspirin 81 MG chewable tablet, Chew 81 mg by mouth in the morning., Disp: , Rfl:    atorvastatin (LIPITOR) 40 MG tablet, Take 40 mg by mouth at bedtime., Disp: , Rfl:    Biotin 78295 MCG TABS, Take 10,000 mcg by mouth every evening., Disp: , Rfl:    cetirizine (ZYRTEC) 10 MG tablet, Take 10 mg by mouth in the morning., Disp: , Rfl:    Cholecalciferol 25 MCG (1000 UT) capsule, Take 1,000 Units by mouth 2 (two) times daily., Disp: , Rfl:    clopidogrel (PLAVIX) 75 MG tablet, Take 1 tablet (75 mg total) by mouth daily., Disp: 90 tablet, Rfl: 1   CRANBERRY PO, Take 15,000 mg by mouth in the morning., Disp: , Rfl:    denosumab (PROLIA) 60 MG/ML SOSY injection, Inject 60 mg into the skin every 6 (six) months., Disp: , Rfl:    DULoxetine (CYMBALTA) 60 MG capsule, Take 60 mg by mouth 2 (two) times daily., Disp: , Rfl:    furosemide (LASIX) 20 MG tablet, TAKE 1 TABLET BY MOUTH ONCE DAILY AS NEEDED FOR FLUID, Disp: 46 tablet, Rfl: 2   gabapentin (NEURONTIN) 800 MG tablet, Take 400 mg by mouth at bedtime., Disp: , Rfl:    HUMALOG KWIKPEN 100 UNIT/ML KwikPen, Inject 5-12 Units into the skin 3 (three) times daily as needed (for a BGL greater than 100)., Disp: , Rfl:    insulin glargine, 2 Unit Dial, (TOUJEO MAX SOLOSTAR) 300  UNIT/ML Solostar Pen, Inject 10 Units into the skin at bedtime. (Patient taking differently: Inject 45 Units into the skin at bedtime.), Disp: , Rfl:    metoprolol tartrate (LOPRESSOR) 25 MG tablet, Take 1 tablet by mouth twice daily, Disp: 180 tablet, Rfl: 0   Multiple Vitamins-Minerals (CENTRUM WOMEN) TABS, Take 1 tablet by mouth daily with breakfast., Disp: , Rfl:    Nutritional Supplements (EQUATE PO), Take by mouth., Disp: , Rfl:    pantoprazole (PROTONIX) 40 MG tablet, Take 1 tablet (40 mg total) by mouth 2 (two) times daily. (Patient taking differently: Take 40 mg by mouth daily.), Disp: 60 tablet, Rfl: 0   polyethylene glycol powder (GLYCOLAX/MIRALAX) 17 GM/SCOOP powder, Take 1 Container by mouth once., Disp: , Rfl:    potassium chloride (KLOR-CON) 10 MEQ tablet, TAKE 1 TABLET BY MOUTH ONCE DAILY AS NEEDED WITH FUROSEMIDE ONLY, Disp: 30 tablet, Rfl: 0   RYBELSUS 3 MG TABS, Take 1 tablet by mouth daily., Disp: , Rfl:    traMADol (ULTRAM) 50 MG tablet, Take 1 tablet (50 mg total) by mouth 3 (three) times daily as needed for severe pain (Back pain)., Disp: 90 tablet, Rfl: 1   zinc gluconate 50 MG tablet, Take 50 mg by mouth in the morning., Disp: , Rfl:  Past Medical History: 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/2022   Shoulder pain    Thyroid disease    Trochanteric bursitis of left hip    Vaginal cysts    4 have been removed    Tobacco Use: Social History   Tobacco Use   Smoking Status Never  Smokeless Tobacco Never    Labs: Review Flowsheet  More data exists      Latest Ref Rng & Units 10/31/2016 02/13/2021 08/02/2021 08/04/2021 01/19/2022  Labs for ITP Cardiac and Pulmonary Rehab  Hemoglobin A1c 4.8 - 5.6 % 12.3  7.1  - 6.6  -  Bicarbonate 20.0 - 28.0 mmol/L - - 44.2  42.1  - -  TCO2 22 - 32 mmol/L - - 44  - 28   O2 Saturation % - - 55.1  91  - -    Details       Multiple values from one day are sorted in reverse-chronological order         Capillary Blood Glucose: Lab Results  Component Value Date   GLUCAP 154 (H) 10/11/2022   GLUCAP 187 (H) 10/11/2022   GLUCAP 159 (H) 09/11/2022   GLUCAP 82 09/09/2022   GLUCAP 143 (H) 09/09/2022     Exercise Target Goals: Exercise Program Goal: Individual exercise prescription set using results from initial 6 min walk test and THRR while considering  patient's activity barriers and safety.   Exercise Prescription Goal: Initial exercise prescription builds to 30-45 minutes a day of aerobic activity, 2-3 days per week.  Home exercise guidelines will be given to patient during program as part of exercise prescription that the participant will acknowledge.  Activity Barriers & Risk Stratification:  Activity Barriers & Cardiac Risk Stratification - 09/02/22 1206       Activity Barriers & Cardiac Risk Stratification   Activity Barriers Back Problems;Arthritis;Neck/Spine Problems;History of Falls;Balance Concerns;Deconditioning;Joint Problems    Cardiac Risk Stratification High   <5 METs on            6 Minute Walk:  6 Minute Walk     Row Name 09/02/22 1205         6 Minute Walk   Phase Initial     Distance 1680 feet     Walk Time 6 minutes     # of Rest Breaks 0     MPH 3.18     METS 3.82     RPE 13     Perceived Dyspnea  0     VO2 Peak 13.39     Symptoms Yes (comment)     Comments 6/10 Bilateral hip pain     Resting HR 92 bpm     Resting BP 110/74     Resting Oxygen  Saturation  96 %     Exercise Oxygen Saturation  during  6 min walk 98 %     Max Ex. HR 114 bpm     Max Ex. BP 146/82     2 Minute Post BP 124/82              Oxygen Initial Assessment:   Oxygen Re-Evaluation:   Oxygen Discharge (Final Oxygen Re-Evaluation):   Initial Exercise Prescription:  Initial Exercise Prescription - 09/02/22 1200       Date of Initial Exercise RX and Referring Provider   Date 09/02/22    Referring Provider Yates Decamp, MD    Expected Discharge Date 11/27/22      Recumbant Bike   Level 1    RPM 55    Watts 30    Minutes 15    METs 2.5      NuStep   Level 1    SPM 60    Minutes 15    METs 2      Prescription Details   Frequency (times per week) 3    Duration Progress to 30 minutes of continuous aerobic without signs/symptoms of physical distress      Intensity   THRR 40-80% of Max Heartrate 64-127    Ratings of Perceived Exertion 11-13    Perceived Dyspnea 0-4      Progression   Progression Continue progressive overload as per policy without signs/symptoms or physical distress.      Resistance Training   Training Prescription Yes    Weight 2    Reps 10-15             Perform Capillary Blood Glucose checks as needed.  Exercise Prescription Changes:   Exercise Prescription Changes     Row Name 09/09/22 1300 09/23/22 1202 10/02/22 0830 10/23/22 0820       Response to Exercise   Blood Pressure (Admit) 122/72 122/66 124/74 128/80    Blood Pressure (Exercise) 130/68 130/60 104/64 130/70    Blood Pressure (Exit) 108/74 108/72 98/60 106/70    Heart Rate (Admit) 96 bpm 78 bpm 90 bpm 96 bpm    Heart Rate (Exercise) 109 bpm 105 bpm 114 bpm 121 bpm    Heart Rate (Exit) 97 bpm 87 bpm 95 bpm 97 bpm    Rating of Perceived Exertion (Exercise) 8 11.5 11 11     Perceived Dyspnea (Exercise) -- 0 0 0    Symptoms none chronic back pain chronic back pain chronic back pain    Comments skipped weights d/t low BS REVD MET's and goals REVD  MET's REVD MET's and goals    Duration Continue with 30 min of aerobic exercise without signs/symptoms of physical distress. Continue with 30 min of aerobic exercise without signs/symptoms of physical distress. Continue with 30 min of aerobic exercise without signs/symptoms of physical distress. Continue with 30 min of aerobic exercise without signs/symptoms of physical distress.    Intensity THRR unchanged THRR unchanged THRR unchanged THRR unchanged      Progression   Progression Continue to progress workloads to maintain intensity without signs/symptoms of physical distress. Continue to progress workloads to maintain intensity without signs/symptoms of physical distress. Continue to progress workloads to maintain intensity without signs/symptoms of physical distress. Continue to progress workloads to maintain intensity without signs/symptoms of physical distress.    Average METs 2.4 2.5 2.5 2.6      Resistance Training   Training Prescription No  Low BS No  Low BS Yes  Low BS No    Weight -- -- 2 lbs wts --  Reps -- -- 10-15 --    Time -- -- 10 Minutes --      Recumbant Bike   Level 1 1 1 2     RPM 73 86 86 99    Watts 14 21 21  33    Minutes 15 15 15 15     METs 2.5 2.3 2.3 2.7      NuStep   Level 1 1 1 2     SPM 78 98 98 98    Minutes 15 15 15 15     METs 2.2 2.7 2.7 2.9             Exercise Comments:   Exercise Comments     Row Name 09/09/22 1315 09/23/22 1205 10/02/22 0838 10/23/22 0826     Exercise Comments Pt first day in program, pt was educated on THRR, RPE, and METs. Pt exercised at an avg of 2.2 METs on the NS and 2.5 METs on the RB. Will increase WL as tolerated. Reviewed MET's and goals. Pt tolerated exercise well with an average MET level of 2.5. Pt feels good about her goals and is increasing strength and stamina Reviewed MET's. Pt tolerated exercise well with an average MET level of 2.5. Pt is doing well, she deals with chronic back pain. She is awaiting MRI  results. Will review home exercise soon, just continue to walk as able for now. REVD MET's and goals. Pt tolerated exercise well with an average MET level of 2.6.             Exercise Goals and Review:   Exercise Goals     Row Name 09/02/22 0804             Exercise Goals   Increase Physical Activity Yes       Intervention Provide advice, education, support and counseling about physical activity/exercise needs.;Develop an individualized exercise prescription for aerobic and resistive training based on initial evaluation findings, risk stratification, comorbidities and participant's personal goals.       Expected Outcomes Short Term: Attend rehab on a regular basis to increase amount of physical activity.;Long Term: Exercising regularly at least 3-5 days a week.;Long Term: Add in home exercise to make exercise part of routine and to increase amount of physical activity.       Increase Strength and Stamina Yes       Intervention Provide advice, education, support and counseling about physical activity/exercise needs.;Develop an individualized exercise prescription for aerobic and resistive training based on initial evaluation findings, risk stratification, comorbidities and participant's personal goals.       Expected Outcomes Short Term: Increase workloads from initial exercise prescription for resistance, speed, and METs.;Short Term: Perform resistance training exercises routinely during rehab and add in resistance training at home;Long Term: Improve cardiorespiratory fitness, muscular endurance and strength as measured by increased METs and functional capacity ( )       Able to understand and use rate of perceived exertion (RPE) scale Yes       Intervention Provide education and explanation on how to use RPE scale       Expected Outcomes Short Term: Able to use RPE daily in rehab to express subjective intensity level;Long Term:  Able to use RPE to guide intensity level when exercising  independently       Knowledge and understanding of Target Heart Rate Range (THRR) Yes       Intervention Provide education and explanation of THRR including how the numbers were predicted and where they  are located for reference       Expected Outcomes Short Term: Able to state/look up THRR;Short Term: Able to use daily as guideline for intensity in rehab;Long Term: Able to use THRR to govern intensity when exercising independently       Understanding of Exercise Prescription Yes       Intervention Provide education, explanation, and written materials on patient's individual exercise prescription       Expected Outcomes Short Term: Able to explain program exercise prescription;Long Term: Able to explain home exercise prescription to exercise independently                Exercise Goals Re-Evaluation :  Exercise Goals Re-Evaluation     Row Name 09/09/22 1310 10/23/22 0822           Exercise Goal Re-Evaluation   Exercise Goals Review Increase Physical Activity;Understanding of Exercise Prescription;Increase Strength and Stamina;Knowledge and understanding of Target Heart Rate Range (THRR);Able to understand and use rate of perceived exertion (RPE) scale Increase Physical Activity;Understanding of Exercise Prescription;Increase Strength and Stamina;Knowledge and understanding of Target Heart Rate Range (THRR);Able to understand and use rate of perceived exertion (RPE) scale      Comments Pt first day in program, pt was educated on THRR, RPE, and METs. Pt exercised at an avg of 2.2 METs on the NS and 2.5 METs on the RB. Pt had to skip weights d/t her BS dropping, pt received glucose gel and other snacks to raise BS. Pt was asymptomatic. REVD MET's and goals. Pt tolerated exercise well with an average MET level of 2.6. She deals with chronic back pain, but is doing well and has increased WL's on both stations and is doing well with the change. She also states she has recieved many resources for  ongoing diet information.      Expected Outcomes Will increase WL as tolerated barring s/s. Will continue to monitor pt and progress workloads as tolerated without sign or symptom               Discharge Exercise Prescription (Final Exercise Prescription Changes):  Exercise Prescription Changes - 10/23/22 0820       Response to Exercise   Blood Pressure (Admit) 128/80    Blood Pressure (Exercise) 130/70    Blood Pressure (Exit) 106/70    Heart Rate (Admit) 96 bpm    Heart Rate (Exercise) 121 bpm    Heart Rate (Exit) 97 bpm    Rating of Perceived Exertion (Exercise) 11    Perceived Dyspnea (Exercise) 0    Symptoms chronic back pain    Comments REVD MET's and goals    Duration Continue with 30 min of aerobic exercise without signs/symptoms of physical distress.    Intensity THRR unchanged      Progression   Progression Continue to progress workloads to maintain intensity without signs/symptoms of physical distress.    Average METs 2.6      Resistance Training   Training Prescription No      Recumbant Bike   Level 2    RPM 99    Watts 33    Minutes 15    METs 2.7      NuStep   Level 2    SPM 98    Minutes 15    METs 2.9             Nutrition:  Target Goals: Understanding of nutrition guidelines, daily intake of sodium 1500mg , cholesterol 200mg , calories 30% from fat and  7% or less from saturated fats, daily to have 5 or more servings of fruits and vegetables.  Biometrics:  Pre Biometrics - 09/02/22 0829       Pre Biometrics   Waist Circumference 42.5 inches    Hip Circumference 49 inches    Waist to Hip Ratio 0.87 %    Triceps Skinfold 45 mm    % Body Fat 48.1 %    Grip Strength 22 kg    Flexibility 0 in   pt has chronic back issues, not done   Single Leg Stand 5 seconds              Nutrition Therapy Plan and Nutrition Goals:  Nutrition Therapy & Goals - 10/07/22 0948       Nutrition Therapy   Diet Heart Healthy Diet    Drug/Food  Interactions Statins/Certain Fruits      Personal Nutrition Goals   Nutrition Goal Patient to identify strategies for reducing cardiovascular risk by attending the [employer] education and nutrition series weekly.   goal in progress.   Personal Goal #2 Patient to improve diet quality by using the plate method as a guide for meal planning to include lean protein/plant protein, fruits, vegetables, whole grains, nonfat dairy as part of a well-balanced diet.   goal in progress.   Personal Goal #3 Patient to reduce sodium intake to 1500mg  per day   goal in progress   Personal Goal #4 Patient to identify strategies for blood sugar control with goal A1c <7%.   goal in progress   Comments Goals in progress. Wallis has attended 5 [employer] education/nutrition series classes. She has started making some dietary changes including increased fiber intake, decreased "grazing"/snacking behaviors, and reading food labels for sodium. She is down 7.5# since starting with our program. Patient will benefit from participation in intensive cardiac rehab for nutrition, exercise, and lifestyle modification.      Intervention Plan   Intervention Prescribe, educate and counsel regarding individualized specific dietary modifications aiming towards targeted core components such as weight, hypertension, lipid management, diabetes, heart failure and other comorbidities.;Nutrition handout(s) given to patient.    Expected Outcomes Short Term Goal: Understand basic principles of dietary content, such as calories, fat, sodium, cholesterol and nutrients.;Long Term Goal: Adherence to prescribed nutrition plan.             Nutrition Assessments:  Nutrition Assessments - 09/09/22 0913       Rate Your Plate Scores   Pre Score 63            MEDIFICTS Score Key: >=70 Need to make dietary changes  40-70 Heart Healthy Diet <= 40 Therapeutic Level Cholesterol Diet   Flowsheet Row INTENSIVE CARDIAC REHAB from 09/09/2022 in  [facility]  Picture Your Plate Total Score on Admission 63      Picture Your Plate Scores: <16 Unhealthy dietary pattern with much room for improvement. 41-50 Dietary pattern unlikely to meet recommendations for good health and room for improvement. 51-60 More healthful dietary pattern, with some room for improvement.  >60 Healthy dietary pattern, although there may be some specific behaviors that could be improved.    Nutrition Goals Re-Evaluation:  Nutrition Goals Re-Evaluation     Row Name 09/09/22 0904 10/07/22 0948           Goals   Current Weight 196 lb 10.4 oz (89.2 kg) 193 lb 2 oz (87.6 kg)  Comment A1c 7.0, lipoproteinA 104.5, LDL 71 no new labs; most recent labs  A1c 7.0, lipoproteinA 104.5, LDL 71      Expected Outcome Annabel is motivated to improve eating habits, decrease sweets/simple sugar intake, and eat on a regular/consistent schedule. Patient will benefit from participation in intensive cardiac rehab for nutrition, exercise, and lifestyle modification Goals in progress. Horace has attended 5 [employer] education/nutrition series classes. She has started making some dietary changes including increased fiber intake, decreased "grazing"/snacking behaviors, and reading food labels for sodium. She is down 7.5# since starting with our program. Patient will benefit from participation in intensive cardiac rehab for nutrition, exercise, and lifestyle modification.               Nutrition Goals Re-Evaluation:  Nutrition Goals Re-Evaluation     Row Name 09/09/22 0904 10/07/22 0948           Goals   Current Weight 196 lb 10.4 oz (89.2 kg) 193 lb 2 oz (87.6 kg)      Comment A1c 7.0, lipoproteinA 104.5, LDL 71 no new labs; most recent labs  A1c 7.0, lipoproteinA 104.5, LDL 71      Expected Outcome Juliane is motivated to improve eating habits, decrease sweets/simple sugar intake, and eat on a regular/consistent  schedule. Patient will benefit from participation in intensive cardiac rehab for nutrition, exercise, and lifestyle modification Goals in progress. Zollie has attended 5 [employer] education/nutrition series classes. She has started making some dietary changes including increased fiber intake, decreased "grazing"/snacking behaviors, and reading food labels for sodium. She is down 7.5# since starting with our program. Patient will benefit from participation in intensive cardiac rehab for nutrition, exercise, and lifestyle modification.               Nutrition Goals Discharge (Final Nutrition Goals Re-Evaluation):  Nutrition Goals Re-Evaluation - 10/07/22 0948       Goals   Current Weight 193 lb 2 oz (87.6 kg)    Comment no new labs; most recent labs  A1c 7.0, lipoproteinA 104.5, LDL 71    Expected Outcome Goals in progress. Harry has attended 5 [employer] education/nutrition series classes. She has started making some dietary changes including increased fiber intake, decreased "grazing"/snacking behaviors, and reading food labels for sodium. She is down 7.5# since starting with our program. Patient will benefit from participation in intensive cardiac rehab for nutrition, exercise, and lifestyle modification.             Psychosocial: Target Goals: Acknowledge presence or absence of significant depression and/or stress, maximize coping skills, provide positive support system. Participant is able to verbalize types and ability to use techniques and skills needed for reducing stress and depression.  Initial Review & Psychosocial Screening:  Initial Psych Review & Screening - 09/02/22 0931       Initial Review   Current issues with Current Anxiety/Panic;Current Psychotropic Meds;Current Stress Concerns;Current Depression    Source of Stress Concerns Chronic Illness;Financial    Comments Delorice shared that she has felt some low levels of depression since her CAD diagnosis and post surgery.  She has struggled to adjust to the lifestyle changes and learning what she can control vs. not control. She is taking Cymbalta and feels it is helping, does not feel she needs additional support. Mirian is also very unhappy with her physical appearance.      Family Dynamics   Good Support System? Yes   Makaylin has her sisters and daughter for support  Barriers   Psychosocial barriers to participate in program The patient should benefit from training in stress management and relaxation.      Screening Interventions   Interventions Encouraged to exercise;Provide feedback about the scores to participant;To provide support and resources with identified psychosocial needs    Expected Outcomes Long Term Goal: Stressors or current issues are controlled or eliminated.;Short Term goal: Identification and review with participant of any Quality of Life or Depression concerns found by scoring the questionnaire.;Long Term goal: The participant improves quality of Life and PHQ9 Scores as seen by post scores and/or verbalization of changes             Quality of Life Scores:  Quality of Life - 09/02/22 1214       Quality of Life   Select Quality of Life      Quality of Life Scores   Health/Function Pre 18.67 %    Socioeconomic Pre 25 %    Psych/Spiritual Pre 18.5 %    Family Pre 24.5 %    GLOBAL Pre 20.79 %            Scores of 19 and below usually indicate a poorer quality of life in these areas.  A difference of  2-3 points is a clinically meaningful difference.  A difference of 2-3 points in the total score of the Quality of Life Index has been associated with significant improvement in overall quality of life, self-image, physical symptoms, and general health in studies assessing change in quality of life.  PHQ-9: Review Flowsheet       09/02/2022  Depression screen PHQ 2/9  Decreased Interest 0  Down, Depressed, Hopeless 1  PHQ - 2 Score 1  Altered sleeping 1  Tired, decreased  energy 1  Change in appetite 1  Feeling bad or failure about yourself  1  Trouble concentrating 0  Moving slowly or fidgety/restless 0  Suicidal thoughts 0  PHQ-9 Score 5  Difficult doing work/chores Somewhat difficult    Details           Interpretation of Total Score  Total Score Depression Severity:  1-4 = Minimal depression, 5-9 = Mild depression, 10-14 = Moderate depression, 15-19 = Moderately severe depression, 20-27 = Severe depression   Psychosocial Evaluation and Intervention:   Psychosocial Re-Evaluation:  Psychosocial Re-Evaluation     Row Name 09/20/22 [phone] 10/22/22 3664           Psychosocial Re-Evaluation   Current issues with Current Abuse or Neglect to Report;Current Stress Concerns;Current Anxiety/Panic;Current Psychotropic Meds Current Stress Concerns;Current Anxiety/Panic;Current Psychotropic Meds      Comments Quality of life questionnarie reviewed on 09/11/22. Leanny has good family support and has returned to work Lindamarie has not voiced any increased concerns or stressors during exercise at cardiac rehab.      Expected Outcomes Johnsie will have decreased concerns/ stressors upon completion of cardiac rehab Johnsie will have decreased concerns/ stressors upon completion of cardiac rehab      Interventions Stress management education;Relaxation education;Encouraged to attend Cardiac Rehabilitation for the exercise Stress management education;Relaxation education;Encouraged to attend Cardiac Rehabilitation for the exercise      Continue Psychosocial Services  Follow up required by staff No Follow up required        Initial Review   Source of Stress Concerns Chronic Illness;Unable to perform yard/household activities;Unable to participate in former interests or hobbies Chronic Illness;Unable to perform yard/household activities;Unable to participate in former interests or hobbies  Comments Will continue to monitor and offer support as needed Will continue  to monitor and offer support as needed               Psychosocial Discharge (Final Psychosocial Re-Evaluation):  Psychosocial Re-Evaluation - 10/22/22 0811       Psychosocial Re-Evaluation   Current issues with Current Stress Concerns;Current Anxiety/Panic;Current Psychotropic Meds    Comments Matti has not voiced any increased concerns or stressors during exercise at cardiac rehab.    Expected Outcomes Johnsie will have decreased concerns/ stressors upon completion of cardiac rehab    Interventions Stress management education;Relaxation education;Encouraged to attend Cardiac Rehabilitation for the exercise    Continue Psychosocial Services  No Follow up required      Initial Review   Source of Stress Concerns Chronic Illness;Unable to perform yard/household activities;Unable to participate in former interests or hobbies    Comments Will continue to monitor and offer support as needed             Vocational Rehabilitation: Provide vocational rehab assistance to qualifying candidates.   Vocational Rehab Evaluation & Intervention:  Vocational Rehab - 09/02/22 0840       Initial Vocational Rehab Evaluation & Intervention   Assessment shows need for Vocational Rehabilitation No   Camella works for [employer]            Education: Education Goals: Education classes will be provided on a weekly basis, covering required topics. Participant will state understanding/return demonstration of topics presented.    Education     Row Name 09/11/22 1000     Education   Cardiac Education Topics Pritikin   Secondary school teacher School   Educator Dietitian   Weekly Topic [employer] Plant-Based Proteins   Instruction Review Code 1- Verbalizes Understanding   Class Start Time 0815   Class Stop Time 0856   Class Time Calculation (min) 41 min    Row Name 09/13/22 0900     Education   Cardiac Education Topics Pritikin   Select Core Videos     Core  Videos   Educator Exercise Physiologist   Select General Education   General Education Hypertension and Heart Disease   Instruction Review Code 1- Verbalizes Understanding   Class Start Time 0810   Class Stop Time 0850   Class Time Calculation (min) 40 min    Row Name 09/23/22 1000     Education   Cardiac Education Topics Pritikin   Select Core Videos     Core Videos   Educator Dietitian   Select Nutrition   Nutrition Overview of the Pritikin Eating Plan   Instruction Review Code 1- Verbalizes Understanding   Class Start Time 0815   Class Stop Time 0850   Class Time Calculation (min) 35 min    Row Name 09/25/22 1100     Education   Cardiac Education Topics Pritikin   Orthoptist   Educator Dietitian   Weekly Topic Fast and Healthy Breakfasts   Instruction Review Code 1- Verbalizes Understanding   Class Start Time 0815   Class Stop Time 0847   Class Time Calculation (min) 32 min    Row Name 10/04/22 0900     Education   Cardiac Education Topics Pritikin   Geographical information systems officer Exercise   Exercise Workshop Location manager and [employer]  Review Code 1- Verbalizes Understanding   Class Start Time [phone]   Class Stop Time 0856   Class Time Calculation (min) 45 min    Row Name 10/07/22 0900     Education   Cardiac Education Topics Pritikin   Glass blower/designer Nutrition   Nutrition Workshop Label Reading   Instruction Review Code 1- Verbalizes Understanding   Class Start Time 0815   Class Stop Time 0904   Class Time Calculation (min) 49 min    Row Name 10/09/22 1100     Education   Cardiac Education Topics Pritikin   Customer service manager   Weekly Topic [employer] Desserts   Instruction Review Code 1- Verbalizes Understanding   Class Start Time 0820   Class Stop Time  0900   Class Time Calculation (min) 40 min    Row Name 10/11/22 1000     Education   Cardiac Education Topics Pritikin   Nurse, learning disability Nutrition   Nutrition Other  Label Reading   Instruction Review Code 1- Verbalizes Understanding   Class Start Time 0815   Class Stop Time 0900   Class Time Calculation (min) 45 min    Row Name 10/21/22 0900     Education   Cardiac Education Topics Pritikin   Select Workshops     Workshops   Educator Exercise Physiologist   Select Exercise   Exercise Workshop Exercise Basics: Building Your Action Plan   Instruction Review Code 1- Verbalizes Understanding   Class Start Time 0813   Class Stop Time 0900   Class Time Calculation (min) 47 min            Core Videos: Exercise    Move It!  Clinical staff conducted group or individual video education with verbal and written material and guidebook.  Patient learns the recommended Pritikin exercise program. Exercise with the goal of living a long, healthy life. Some of the health benefits of exercise include controlled diabetes, healthier blood pressure levels, improved cholesterol levels, improved heart and lung capacity, improved sleep, and better body composition. Everyone should speak with their doctor before starting or changing an exercise routine.  Biomechanical Limitations Clinical staff conducted group or individual video education with verbal and written material and guidebook.  Patient learns how biomechanical limitations can impact exercise and how we can mitigate and possibly overcome limitations to have an impactful and balanced exercise routine.  Body Composition Clinical staff conducted group or individual video education with verbal and written material and guidebook.  Patient learns that body composition (ratio of muscle mass to fat mass) is a key component to assessing overall fitness, rather than body weight alone. Increased  fat mass, especially visceral belly fat, can put [place] at increased risk for metabolic syndrome, type 2 diabetes, heart disease, and even death. It is recommended to combine diet and exercise (cardiovascular and resistance training) to improve your body composition. Seek guidance from your physician and exercise physiologist before implementing an exercise routine.  Exercise Action Plan Clinical staff conducted group or individual video education with verbal and written material and guidebook.  Patient learns the recommended strategies to achieve and enjoy long-term exercise adherence, including variety, self-motivation, self-efficacy, and positive decision making. Benefits of exercise include fitness, good health, weight management, more energy, better sleep, less stress, and  overall well-being.  Medical   Heart Disease Risk Reduction Clinical staff conducted group or individual video education with verbal and written material and guidebook.  Patient learns our heart is our most vital organ as it circulates oxygen, nutrients, white blood cells, and hormones throughout the entire body, and carries waste away. Data supports a plant-based eating plan like the Pritikin Program for its effectiveness in slowing progression of and reversing heart disease. The video provides a number of recommendations to address heart disease.   Metabolic Syndrome and Belly Fat  Clinical staff conducted group or individual video education with verbal and written material and guidebook.  Patient learns what metabolic syndrome is, how it leads to heart disease, and how one can reverse it and keep it from coming back. You have metabolic syndrome if you have 3 of the following 5 criteria: abdominal obesity, high blood pressure, high triglycerides, low HDL cholesterol, and high blood sugar.  Hypertension and Heart Disease Clinical staff conducted group or individual video education with verbal and written material and guidebook.   Patient learns that high blood pressure, or hypertension, is very common in the [place]. Hypertension is largely due to excessive salt intake, but other important risk factors include being overweight, physical inactivity, drinking too much alcohol, smoking, and not eating enough potassium from fruits and vegetables. High blood pressure is a leading risk factor for heart attack, stroke, congestive heart failure, dementia, kidney failure, and premature death. Long-term effects of excessive salt intake include stiffening of the arteries and thickening of heart muscle and organ damage. Recommendations include ways to reduce hypertension and the risk of heart disease.  Diseases of Our Time - Focusing on Diabetes Clinical staff conducted group or individual video education with verbal and written material and guidebook.  Patient learns why the best way to stop diseases of our time is prevention, through food and other lifestyle changes. Medicine (such as prescription pills and surgeries) is often only a Band-Aid on the problem, not a long-term solution. Most common diseases of our time include obesity, type 2 diabetes, hypertension, heart disease, and cancer. The Pritikin Program is recommended and has been proven to help reduce, reverse, and/or prevent the damaging effects of metabolic syndrome.  Nutrition   Overview of the Pritikin Eating Plan  Clinical staff conducted group or individual video education with verbal and written material and guidebook.  Patient learns about the Pritikin Eating Plan for disease risk reduction. The Pritikin Eating Plan emphasizes a wide variety of unrefined, minimally-processed carbohydrates, like fruits, vegetables, whole grains, and legumes. Go, Caution, and Stop food choices are explained. Plant-based and lean animal proteins are emphasized. Rationale provided for low sodium intake for blood pressure control, low added sugars for blood sugar stabilization, and low  added fats and oils for coronary artery disease risk reduction and weight management.  Calorie Density  Clinical staff conducted group or individual video education with verbal and written material and guidebook.  Patient learns about calorie density and how it impacts the Pritikin Eating Plan. Knowing the characteristics of the food you choose will help you decide whether those foods will lead to weight gain or weight loss, and whether you want to consume more or less of them. Weight loss is usually a side effect of the Pritikin Eating Plan because of its focus on low calorie-dense foods.  Label Reading  Clinical staff conducted group or individual video education with verbal and written material and guidebook.  Patient learns about the Pritikin recommended  label reading guidelines and corresponding recommendations regarding calorie density, added sugars, sodium content, and whole grains.  Dining Out - Part 1  Clinical staff conducted group or individual video education with verbal and written material and guidebook.  Patient learns that restaurant meals can be sabotaging because they can be so high in calories, fat, sodium, and/or sugar. Patient learns recommended strategies on how to positively address this and avoid unhealthy pitfalls.  Facts on Fats  Clinical staff conducted group or individual video education with verbal and written material and guidebook.  Patient learns that lifestyle modifications can be just as effective, if not more so, as many medications for lowering your risk of heart disease. A Pritikin lifestyle can help to reduce your risk of inflammation and atherosclerosis (cholesterol build-up, or plaque, in the artery walls). Lifestyle interventions such as dietary choices and physical activity address the cause of atherosclerosis. A review of the types of fats and their impact on blood cholesterol levels, along with dietary recommendations to reduce fat intake is also  included.  Nutrition Action Plan  Clinical staff conducted group or individual video education with verbal and written material and guidebook.  Patient learns how to incorporate Pritikin recommendations into their lifestyle. Recommendations include planning and keeping personal health goals in mind as an important part of their success.  Healthy Mind-Set    Healthy Minds, Bodies, Hearts  Clinical staff conducted group or individual video education with verbal and written material and guidebook.  Patient learns how to identify when they are stressed. Video will discuss the impact of that stress, as well as the many benefits of stress management. Patient will also be introduced to stress management techniques. The way we think, act, and feel has an impact on our hearts.  How Our Thoughts Can Heal Our Hearts  Clinical staff conducted group or individual video education with verbal and written material and guidebook.  Patient learns that negative thoughts can cause depression and anxiety. This can result in negative lifestyle behavior and serious health problems. Cognitive behavioral therapy is an effective method to help control our thoughts in order to change and improve our emotional outlook.  Additional Videos:  Exercise    Improving Performance  Clinical staff conducted group or individual video education with verbal and written material and guidebook.  Patient learns to use a non-linear approach by alternating intensity levels and lengths of time spent exercising to help burn more calories and lose more body fat. Cardiovascular exercise helps improve heart health, metabolism, hormonal balance, blood sugar control, and recovery from fatigue. Resistance training improves strength, endurance, balance, coordination, reaction time, metabolism, and muscle mass. Flexibility exercise improves circulation, posture, and balance. Seek guidance from your physician and exercise physiologist before  implementing an exercise routine and learn your capabilities and proper form for all exercise.  Introduction to Yoga  Clinical staff conducted group or individual video education with verbal and written material and guidebook.  Patient learns about yoga, a discipline of the coming together of mind, breath, and body. The benefits of yoga include improved flexibility, improved range of motion, better posture and core strength, increased lung function, weight loss, and positive self-image. Yoga's heart health benefits include lowered blood pressure, healthier heart rate, decreased cholesterol and triglyceride levels, improved immune function, and reduced stress. Seek guidance from your physician and exercise physiologist before implementing an exercise routine and learn your capabilities and proper form for all exercise.  Medical   Aging: Enhancing Your Quality of Life  Clinical  staff conducted group or individual video education with verbal and written material and guidebook.  Patient learns key strategies and recommendations to stay in good physical health and enhance quality of life, such as prevention strategies, having an advocate, securing a Health Care Proxy and Power of Attorney, and keeping a list of medications and system for tracking them. It also discusses how to avoid risk for bone loss.  Biology of Weight Control  Clinical staff conducted group or individual video education with verbal and written material and guidebook.  Patient learns that weight gain occurs because we consume more calories than we burn (eating more, moving less). Even if your body weight is normal, you may have higher ratios of fat compared to muscle mass. Too much body fat puts you at increased risk for cardiovascular disease, heart attack, stroke, type 2 diabetes, and obesity-related cancers. In addition to exercise, following the Pritikin Eating Plan can help reduce your risk.  Decoding Lab Results  Clinical staff  conducted group or individual video education with verbal and written material and guidebook.  Patient learns that lab test reflects one measurement whose values change over time and are influenced by many factors, including medication, stress, sleep, exercise, food, hydration, pre-existing medical conditions, and more. It is recommended to use the knowledge from this video to become more involved with your lab results and evaluate your numbers to speak with your doctor.   Diseases of Our Time - Overview  Clinical staff conducted group or individual video education with verbal and written material and guidebook.  Patient learns that according to the CDC, 50% to 70% of chronic diseases (such as obesity, type 2 diabetes, elevated lipids, hypertension, and heart disease) are avoidable through lifestyle improvements including healthier food choices, listening to satiety cues, and increased physical activity.  Sleep Disorders Clinical staff conducted group or individual video education with verbal and written material and guidebook.  Patient learns how good quality and duration of sleep are important to overall health and well-being. Patient also learns about sleep disorders and how they impact health along with recommendations to address them, including discussing with a physician.  Nutrition  Dining Out - Part 2 Clinical staff conducted group or individual video education with verbal and written material and guidebook.  Patient learns how to plan ahead and communicate in order to maximize their dining experience in a healthy and nutritious manner. Included are recommended food choices based on the type of restaurant the patient is visiting.   Fueling a Banker conducted group or individual video education with verbal and written material and guidebook.  There is a strong connection between our food choices and our health. Diseases like obesity and type 2 diabetes are very  prevalent and are in large-part due to lifestyle choices. The Pritikin Eating Plan provides plenty of food and hunger-curbing satisfaction. It is easy to follow, affordable, and helps reduce health risks.  Menu Workshop  Clinical staff conducted group or individual video education with verbal and written material and guidebook.  Patient learns that restaurant meals can sabotage health goals because they are often packed with calories, fat, sodium, and sugar. Recommendations include strategies to plan ahead and to communicate with the manager, chef, or server to help order a healthier meal.  Planning Your Eating Strategy  Clinical staff conducted group or individual video education with verbal and written material and guidebook.  Patient learns about the Pritikin Eating Plan and its benefit of reducing the risk of  disease. The Pritikin Eating Plan does not focus on calories. Instead, it emphasizes high-quality, nutrient-rich foods. By knowing the characteristics of the foods, we choose, we can determine their calorie density and make informed decisions.  Targeting Your Nutrition Priorities  Clinical staff conducted group or individual video education with verbal and written material and guidebook.  Patient learns that lifestyle habits have a tremendous impact on disease risk and progression. This video provides eating and physical activity recommendations based on your personal health goals, such as reducing LDL cholesterol, losing weight, preventing or controlling type 2 diabetes, and reducing high blood pressure.  Vitamins and Minerals  Clinical staff conducted group or individual video education with verbal and written material and guidebook.  Patient learns different ways to obtain key vitamins and minerals, including through a recommended healthy diet. It is important to discuss all supplements you take with your doctor.   Healthy Mind-Set    Smoking Cessation  Clinical staff conducted group  or individual video education with verbal and written material and guidebook.  Patient learns that cigarette smoking and tobacco addiction pose a serious health risk which affects millions of people. Stopping smoking will significantly reduce the risk of heart disease, lung disease, and many forms of cancer. Recommended strategies for quitting are covered, including working with your doctor to develop a successful plan.  Culinary   Becoming a Set designer conducted group or individual video education with verbal and written material and guidebook.  Patient learns that cooking at home can be healthy, cost-effective, quick, and puts them in control. Keys to cooking healthy recipes will include looking at your recipe, assessing your equipment needs, planning ahead, making it simple, choosing cost-effective seasonal ingredients, and limiting the use of added fats, salts, and sugars.  Cooking - Breakfast and Snacks  Clinical staff conducted group or individual video education with verbal and written material and guidebook.  Patient learns how important breakfast is to satiety and nutrition through the entire day. Recommendations include key foods to eat during breakfast to help stabilize blood sugar levels and to prevent overeating at meals later in the day. Planning ahead is also a key component.  Cooking - Educational psychologist conducted group or individual video education with verbal and written material and guidebook.  Patient learns eating strategies to improve overall health, including an approach to cook more at home. Recommendations include thinking of animal protein as a side on your plate rather than center stage and focusing instead on lower calorie dense options like vegetables, fruits, whole grains, and plant-based proteins, such as beans. Making sauces in large quantities to freeze for later and leaving the skin on your vegetables are also recommended to maximize  your experience.  Cooking - Healthy Salads and Dressing Clinical staff conducted group or individual video education with verbal and written material and guidebook.  Patient learns that vegetables, fruits, whole grains, and legumes are the foundations of the Pritikin Eating Plan. Recommendations include how to incorporate each of these in flavorful and healthy salads, and how to create homemade salad dressings. Proper handling of ingredients is also covered. Cooking - Soups and [employer] - Soups and Desserts Clinical staff conducted group or individual video education with verbal and written material and guidebook.  Patient learns that Pritikin soups and desserts make for easy, nutritious, and delicious snacks and meal components that are low in sodium, fat, sugar, and calorie density, while high in vitamins, minerals, and filling fiber.  Recommendations include simple and healthy ideas for soups and desserts.   Overview     The Pritikin Solution Program Overview Clinical staff conducted group or individual video education with verbal and written material and guidebook.  Patient learns that the results of the Pritikin Program have been documented in more than 100 articles published in peer-reviewed journals, and the benefits include reducing risk factors for (and, in some cases, even reversing) high cholesterol, high blood pressure, type 2 diabetes, obesity, and more! An overview of the three key pillars of the Pritikin Program will be covered: eating well, doing regular exercise, and having a healthy mind-set.  WORKSHOPS  Exercise: Exercise Basics: Building Your Action Plan Clinical staff led group instruction and group discussion with PowerPoint presentation and patient guidebook. To enhance the learning environment the use of posters, models and videos may be added. At the conclusion of this workshop, patients will comprehend the difference between physical activity and exercise, as well  as the benefits of incorporating both, into their routine. Patients will understand the FITT (Frequency, Intensity, Time, and Type) principle and how to use it to build an exercise action plan. In addition, safety concerns and other considerations for exercise and cardiac rehab will be addressed by the presenter. The purpose of this lesson is to promote a comprehensive and effective weekly exercise routine in order to improve patients' overall level of fitness.   Managing Heart Disease: Your Path to a Healthier Heart Clinical staff led group instruction and group discussion with PowerPoint presentation and patient guidebook. To enhance the learning environment the use of posters, models and videos may be added.At the conclusion of this workshop, patients will understand the anatomy and physiology of the heart. Additionally, they will understand how Pritikin's three pillars impact the risk factors, the progression, and the management of heart disease.  The purpose of this lesson is to provide a high-level overview of the heart, heart disease, and how the Pritikin lifestyle positively impacts risk factors.  Exercise Biomechanics Clinical staff led group instruction and group discussion with PowerPoint presentation and patient guidebook. To enhance the learning environment the use of posters, models and videos may be added. Patients will learn how the structural parts of their bodies function and how these functions impact their daily activities, movement, and exercise. Patients will learn how to promote a neutral spine, learn how to manage pain, and identify ways to improve their physical movement in order to promote healthy living. The purpose of this lesson is to expose patients to common physical limitations that impact physical activity. Participants will learn practical ways to adapt and manage aches and pains, and to minimize their effect on regular exercise. Patients will learn how to  maintain good posture while sitting, walking, and lifting.  Balance Training and Fall Prevention  Clinical staff led group instruction and group discussion with PowerPoint presentation and patient guidebook. To enhance the learning environment the use of posters, models and videos may be added. At the conclusion of this workshop, patients will understand the importance of their sensorimotor skills (vision, proprioception, and the vestibular system) in maintaining their ability to balance as they age. Patients will apply a variety of balancing exercises that are appropriate for their current level of function. Patients will understand the common causes for poor balance, possible solutions to these problems, and ways to modify their physical environment in order to minimize their fall risk. The purpose of this lesson is to teach patients about the importance of maintaining balance as  they age and ways to minimize their risk of falling.  WORKSHOPS   Nutrition:  Fueling a Ship broker led group instruction and group discussion with PowerPoint presentation and patient guidebook. To enhance the learning environment the use of posters, models and videos may be added. Patients will review the foundational principles of the Pritikin Eating Plan and understand what constitutes a serving size in each of the food groups. Patients will also learn Pritikin-friendly foods that are better choices when away from home and review make-ahead meal and snack options. Calorie density will be reviewed and applied to three nutrition priorities: weight maintenance, weight loss, and weight gain. The purpose of this lesson is to reinforce (in a group setting) the key concepts around what patients are recommended to eat and how to apply these guidelines when away from home by planning and selecting Pritikin-friendly options. Patients will understand how calorie density may be adjusted for different weight management  goals.  Mindful Eating  Clinical staff led group instruction and group discussion with PowerPoint presentation and patient guidebook. To enhance the learning environment the use of posters, models and videos may be added. Patients will briefly review the concepts of the Pritikin Eating Plan and the importance of low-calorie dense foods. The concept of mindful eating will be introduced as well as the importance of paying attention to internal hunger signals. Triggers for non-hunger eating and techniques for dealing with triggers will be explored. The purpose of this lesson is to provide patients with the opportunity to review the basic principles of the Pritikin Eating Plan, discuss the value of eating mindfully and how to measure internal cues of hunger and fullness using the Hunger Scale. Patients will also discuss reasons for non-hunger eating and learn strategies to use for controlling emotional eating.  Targeting Your Nutrition Priorities Clinical staff led group instruction and group discussion with PowerPoint presentation and patient guidebook. To enhance the learning environment the use of posters, models and videos may be added. Patients will learn how to determine their genetic susceptibility to disease by reviewing their family history. Patients will gain insight into the importance of diet as part of an overall healthy lifestyle in mitigating the impact of genetics and other environmental insults. The purpose of this lesson is to provide patients with the opportunity to assess their personal nutrition priorities by looking at their family history, their own health history and current risk factors. Patients will also be able to discuss ways of prioritizing and modifying the Pritikin Eating Plan for their highest risk areas  Menu  Clinical staff led group instruction and group discussion with PowerPoint presentation and patient guidebook. To enhance the learning environment the use of posters,  models and videos may be added. Using menus brought in from [employer], or printed from [employer], patients will apply the Pritikin dining out guidelines that were presented in the [employer] video. Patients will also be able to practice these guidelines in a variety of provided scenarios. The purpose of this lesson is to provide patients with the opportunity to practice hands-on learning of the Pritikin Dining Out guidelines with actual menus and practice scenarios.  Label Reading Clinical staff led group instruction and group discussion with PowerPoint presentation and patient guidebook. To enhance the learning environment the use of posters, models and videos may be added. Patients will review and discuss the Pritikin label reading guidelines presented in Pritikin's Label Reading Educational series video. Using fool labels brought in from local  grocery stores and markets, patients will apply the label reading guidelines and determine if the packaged food meet the Pritikin guidelines. The purpose of this lesson is to provide patients with the opportunity to review, discuss, and practice hands-on learning of the Pritikin Label Reading guidelines with actual packaged food labels. Cooking School  Pritikin's [employer] are designed to teach patients ways to prepare quick, simple, and affordable recipes at home. The importance of nutrition's role in chronic disease risk reduction is reflected in its emphasis in the overall Pritikin program. By learning how to prepare essential core Pritikin Eating Plan recipes, patients will increase control over what they eat; be able to customize the flavor of foods without the use of added salt, sugar, or fat; and improve the quality of the food they consume. By learning a set of core recipes which are easily assembled, quickly prepared, and affordable, patients are more likely to prepare more healthy foods at home. These workshops  focus on convenient breakfasts, simple entres, side dishes, and desserts which can be prepared with minimal effort and are consistent with nutrition recommendations for cardiovascular risk reduction. Cooking [employer] are taught by a Armed forces logistics/support/administrative officer (RD) who has been trained by the [employer]. The chef or RD has a clear understanding of the importance of minimizing - if not completely eliminating - added fat, sugar, and sodium in recipes. Throughout the series of Cooking School Workshop sessions, patients will learn about healthy ingredients and efficient methods of cooking to build confidence in their capability to prepare    Cooking School weekly topics:  Adding Flavor- Sodium-Free  Fast and Healthy Breakfasts  Powerhouse Plant-Based Proteins  Satisfying Salads and Dressings  Simple Sides and Sauces  International Cuisine-Spotlight on the [employer] Zones  Delicious Desserts  Savory Soups  [employer] - Meals in a Astronomer Appetizers and Snacks  Comforting Weekend Breakfasts  One-Pot Wonders   Fast Evening Meals  Landscape architect Your Pritikin Plate  WORKSHOPS   Healthy Mindset (Psychosocial):  Focused Goals, Sustainable Changes Clinical staff led group instruction and group discussion with PowerPoint presentation and patient guidebook. To enhance the learning environment the use of posters, models and videos may be added. Patients will be able to apply effective goal setting strategies to establish at least one personal goal, and then take consistent, meaningful action toward that goal. They will learn to identify common barriers to achieving personal goals and develop strategies to overcome them. Patients will also gain an understanding of how our mind-set can impact our ability to achieve goals and the importance of cultivating a positive and growth-oriented mind-set. The purpose of this lesson is to provide patients with a deeper  understanding of how to set and achieve personal goals, as well as the tools and strategies needed to overcome common obstacles which may arise along the way.  From Head to Heart: The Power of a Healthy Outlook  Clinical staff led group instruction and group discussion with PowerPoint presentation and patient guidebook. To enhance the learning environment the use of posters, models and videos may be added. Patients will be able to recognize and describe the impact of emotions and mood on physical health. They will discover the importance of self-care and explore self-care practices which may work for them. Patients will also learn how to utilize the 4 C's to cultivate a healthier outlook and better manage stress and challenges. The purpose of this lesson is to demonstrate to  patients how a healthy outlook is an essential part of maintaining good health, especially as they continue their cardiac rehab journey.  Healthy Sleep for a Healthy Heart Clinical staff led group instruction and group discussion with PowerPoint presentation and patient guidebook. To enhance the learning environment the use of posters, models and videos may be added. At the conclusion of this workshop, patients will be able to demonstrate knowledge of the importance of sleep to overall health, well-being, and quality of life. They will understand the symptoms of, and treatments for, common sleep disorders. Patients will also be able to identify daytime and nighttime behaviors which impact sleep, and they will be able to apply these tools to help manage sleep-related challenges. The purpose of this lesson is to provide patients with a general overview of sleep and outline the importance of quality sleep. Patients will learn about a few of the most common sleep disorders. Patients will also be introduced to the concept of "sleep hygiene," and discover ways to self-manage certain sleeping problems through simple daily behavior changes.  Finally, the workshop will motivate patients by clarifying the links between quality sleep and their goals of heart-healthy living.   Recognizing and Reducing Stress Clinical staff led group instruction and group discussion with PowerPoint presentation and patient guidebook. To enhance the learning environment the use of posters, models and videos may be added. At the conclusion of this workshop, patients will be able to understand the types of stress reactions, differentiate between acute and chronic stress, and recognize the impact that chronic stress has on their health. They will also be able to apply different coping mechanisms, such as reframing negative self-talk. Patients will have the opportunity to practice a variety of stress management techniques, such as deep abdominal breathing, progressive muscle relaxation, and/or guided imagery.  The purpose of this lesson is to educate patients on the role of stress in their lives and to provide healthy techniques for coping with it.  Learning Barriers/Preferences:  Learning Barriers/Preferences - 09/02/22 0841       Learning Barriers/Preferences   Learning Barriers Sight   glasses   Learning Preferences Audio;Computer/Internet;Group Instruction;Individual Instruction;Skilled Demonstration;Pictoral;Verbal Instruction;Video;Written Material             Education Topics:  Knowledge Questionnaire Score:  Knowledge Questionnaire Score - 09/02/22 0840       Knowledge Questionnaire Score   Pre Score 21/24             Core Components/Risk Factors/Patient Goals at Admission:  Personal Goals and Risk Factors at Admission - 09/02/22 0841       Core Components/Risk Factors/Patient Goals on Admission    Weight Management Yes;Obesity;Weight Loss    Intervention Weight Management: Provide education and appropriate resources to help participant work on and attain dietary goals.;Weight Management: Develop a combined nutrition and exercise  program designed to reach desired caloric intake, while maintaining appropriate intake of nutrient and fiber, sodium and fats, and appropriate energy expenditure required for the weight goal.;Weight Management/Obesity: Establish reasonable short term and long term weight goals.;Obesity: Provide education and appropriate resources to help participant work on and attain dietary goals.    Admit Weight 200 lb 9.9 oz (91 kg)    Expected Outcomes Short Term: Continue to assess and modify interventions until short term weight is achieved;Long Term: Adherence to nutrition and physical activity/exercise program aimed toward attainment of established weight goal;Weight Loss: Understanding of general recommendations for a balanced deficit meal plan, which promotes 1-2 lb weight loss per  week and includes a negative energy balance of [phone] kcal/d;Understanding recommendations for meals to include 15-35% energy as protein, 25-35% energy from fat, 35-60% energy from carbohydrates, less than 200mg  of dietary cholesterol, 20-35 gm of total fiber daily;Understanding of distribution of calorie intake throughout the day with the consumption of 4-5 meals/snacks    Diabetes Yes    Intervention Provide education about signs/symptoms and action to take for hypo/hyperglycemia.;Provide education about proper nutrition, including hydration, and aerobic/resistive exercise prescription along with prescribed medications to achieve blood glucose in normal ranges: Fasting glucose 65-99 mg/dL    Expected Outcomes Short Term: Participant verbalizes understanding of the signs/symptoms and immediate care of hyper/hypoglycemia, proper foot care and importance of medication, aerobic/resistive exercise and nutrition plan for blood glucose control.;Long Term: Attainment of HbA1C < 7%.    Hypertension Yes    Intervention Provide education on lifestyle modifcations including regular physical activity/exercise, weight management, moderate sodium  restriction and increased consumption of fresh fruit, vegetables, and low fat dairy, alcohol moderation, and smoking cessation.;Monitor prescription use compliance.    Expected Outcomes Short Term: Continued assessment and intervention until BP is < 140/2mm HG in hypertensive participants. < 130/63mm HG in hypertensive participants with diabetes, heart failure or chronic kidney disease.;Long Term: Maintenance of blood pressure at goal levels.    Lipids Yes    Intervention Provide education and support for participant on nutrition & aerobic/resistive exercise along with prescribed medications to achieve LDL 70mg , HDL >40mg .    Expected Outcomes Short Term: Participant states understanding of desired cholesterol values and is compliant with medications prescribed. Participant is following exercise prescription and nutrition guidelines.;Long Term: Cholesterol controlled with medications as prescribed, with individualized exercise RX and with personalized nutrition plan. Value goals: LDL < 70mg , HDL > 40 mg.    Stress Yes    Intervention Offer individual and/or small group education and counseling on adjustment to heart disease, stress management and health-related lifestyle change. Teach and support self-help strategies.;Refer participants experiencing significant psychosocial distress to appropriate mental health specialists for further evaluation and treatment. When possible, include family members and significant others in education/counseling sessions.    Expected Outcomes Short Term: Participant demonstrates changes in health-related behavior, relaxation and other stress management skills, ability to obtain effective social support, and compliance with psychotropic medications if prescribed.;Long Term: Emotional wellbeing is indicated by absence of clinically significant psychosocial distress or social isolation.             Core Components/Risk Factors/Patient Goals Review:   Goals and Risk  Factor Review     Row Name 09/20/22 1004 10/22/22 0814           Core Components/Risk Factors/Patient Goals Review   Personal Goals Review Weight Management/Obesity;Diabetes;Hypertension;Lipids;Stress Weight Management/Obesity;Diabetes;Hypertension;Lipids;Stress      Review Safiyya is off to a good start to exercise at cardiac rehab. Vital signs and CBG's have been stable Zayda is off to a good start to exercise at cardiac rehab. Vital signs and CBG's have been stable. Deletha has lost 4.4 kg since starting cardiac rehab.      Expected Outcomes Sindhu will continue to participate in cardiac rehab for exercise, nutrition and lifestyle modifications. Adelaine will continue to participate in cardiac rehab for exercise, nutrition and lifestyle modifications.               Core Components/Risk Factors/Patient Goals at Discharge (Final Review):   Goals and Risk Factor Review - 10/22/22 0814       Core Components/Risk Factors/Patient Goals  Review   Personal Goals Review Weight Management/Obesity;Diabetes;Hypertension;Lipids;Stress    Review Saraih is off to a good start to exercise at cardiac rehab. Vital signs and CBG's have been stable. Lamya has lost 4.4 kg since starting cardiac rehab.    Expected Outcomes Alanys will continue to participate in cardiac rehab for exercise, nutrition and lifestyle modifications.             ITP Comments:  ITP Comments     Row Name 09/02/22 [phone] 09/20/22 0955 10/22/22 0809       ITP Comments Dr. Armanda Magic medical director. Introduction to pritikin education/ intensive cardiac rehab. Initial orientation packet reviewed with patient. 30 Day ITP Review. Blakley started cardiac rehab on 09/09/22 and is off to a good start to exercise. 30 Day ITP Review. Bianey has good attendance and participation in cardiac rehab.              Comments: See ITP comments.Thayer Headings RN BSN

## 2022-10-23 ENCOUNTER — Encounter (HOSPITAL_COMMUNITY)
Admission: RE | Admit: 2022-10-23 | Discharge: 2022-10-23 | Disposition: A | Discharge status: Home / Self Care | Payer: 59 | Source: Ambulatory Visit | Attending: Cardiology | Admitting: Cardiology

## 2022-10-23 DIAGNOSIS — Z951 Presence of aortocoronary bypass graft: Secondary | ICD-10-CM

## 2022-10-25 ENCOUNTER — Encounter (HOSPITAL_COMMUNITY)
Admission: RE | Admit: 2022-10-25 | Discharge: 2022-10-25 | Disposition: A | Discharge status: Home / Self Care | Payer: 59 | Source: Ambulatory Visit | Attending: Cardiology | Admitting: Cardiology

## 2022-10-25 DIAGNOSIS — Z951 Presence of aortocoronary bypass graft: Secondary | ICD-10-CM

## 2022-10-28 ENCOUNTER — Encounter (HOSPITAL_COMMUNITY)
Admission: RE | Admit: 2022-10-28 | Discharge: 2022-10-28 | Disposition: A | Discharge status: Home / Self Care | Payer: 59 | Source: Ambulatory Visit | Attending: Cardiology | Admitting: Cardiology

## 2022-10-28 DIAGNOSIS — Z951 Presence of aortocoronary bypass graft: Secondary | ICD-10-CM

## 2022-10-30 ENCOUNTER — Encounter (HOSPITAL_COMMUNITY)
Admission: RE | Admit: 2022-10-30 | Discharge: 2022-10-30 | Disposition: A | Discharge status: Home / Self Care | Payer: 59 | Source: Ambulatory Visit | Attending: Cardiology | Admitting: Cardiology

## 2022-10-30 DIAGNOSIS — Z48812 Encounter for surgical aftercare following surgery on the circulatory system: Secondary | ICD-10-CM | POA: Diagnosis present

## 2022-10-30 DIAGNOSIS — E785 Hyperlipidemia, unspecified: Secondary | ICD-10-CM | POA: Insufficient documentation

## 2022-10-30 DIAGNOSIS — E119 Type 2 diabetes mellitus without complications: Secondary | ICD-10-CM | POA: Diagnosis not present

## 2022-10-30 DIAGNOSIS — Z951 Presence of aortocoronary bypass graft: Secondary | ICD-10-CM | POA: Diagnosis present

## 2022-10-30 DIAGNOSIS — I10 Essential (primary) hypertension: Secondary | ICD-10-CM | POA: Diagnosis not present

## 2022-11-01 ENCOUNTER — Encounter (HOSPITAL_COMMUNITY)
Admission: RE | Admit: 2022-11-01 | Discharge: 2022-11-01 | Disposition: A | Discharge status: Home / Self Care | Payer: 59 | Source: Ambulatory Visit | Attending: Cardiology | Admitting: Cardiology

## 2022-11-01 DIAGNOSIS — Z951 Presence of aortocoronary bypass graft: Secondary | ICD-10-CM

## 2022-11-01 DIAGNOSIS — Z48812 Encounter for surgical aftercare following surgery on the circulatory system: Secondary | ICD-10-CM | POA: Diagnosis not present

## 2022-11-04 ENCOUNTER — Emergency Department (HOSPITAL_COMMUNITY)
Admission: EM | Admit: 2022-11-04 | Discharge: 2022-11-05 | Discharge status: Against Medical Advice | Payer: 59 | Attending: Emergency Medicine | Admitting: Emergency Medicine

## 2022-11-04 ENCOUNTER — Encounter (HOSPITAL_COMMUNITY): Payer: Self-pay

## 2022-11-04 ENCOUNTER — Other Ambulatory Visit: Payer: Self-pay

## 2022-11-04 ENCOUNTER — Encounter (HOSPITAL_COMMUNITY)
Admission: RE | Admit: 2022-11-04 | Discharge: 2022-11-04 | Disposition: A | Discharge status: Home / Self Care | Payer: 59 | Source: Ambulatory Visit | Attending: Cardiology

## 2022-11-04 DIAGNOSIS — Z5321 Procedure and treatment not carried out due to patient leaving prior to being seen by health care provider: Secondary | ICD-10-CM | POA: Insufficient documentation

## 2022-11-04 DIAGNOSIS — W01198A Fall on same level from slipping, tripping and stumbling with subsequent striking against other object, initial encounter: Secondary | ICD-10-CM | POA: Insufficient documentation

## 2022-11-04 DIAGNOSIS — S0083XA Contusion of other part of head, initial encounter: Secondary | ICD-10-CM | POA: Diagnosis present

## 2022-11-04 DIAGNOSIS — Z951 Presence of aortocoronary bypass graft: Secondary | ICD-10-CM

## 2022-11-04 NOTE — ED Notes (Signed)
Pt stated "sister is a MD in [facility] and talked to sister. Sister stated if no symptoms were present that pt might be okay to go home". Pt witness leaving with other sister that lives in [place].

## 2022-11-04 NOTE — ED Triage Notes (Signed)
Pt states she was at the [facility] on Saturday and was going across finish line and tripped over feet and fell on right side and hit head on asphalt and metal pole. Pt takes plavix. Pt's right eye is ecchymotic. Pt c/o right knee and right foot pain. Pt's has a large hematoma on side of face approx the size of a golf ball.

## 2022-11-04 NOTE — Progress Notes (Signed)
Incomplete Session Note  Patient Details  Name: Kayla Berger MRN: [id number] Date of Birth: [birth year] Referring Provider:   Flowsheet Row INTENSIVE CARDIAC REHAB ORIENT from 09/02/2022 in [facility]  Referring Provider Yates Decamp, MD       [name] did not complete her rehab session. [name] reported having had a fall at the end of the completion of the Heart Walk this past Saturday. She fell to her right side with injury to her right knee, right hand, right side and hit the right side of her head. She presents this morning with right knee swelling, swelling to the right temple area and bruising and swelling under right eye and slight swelling to her right hand. BP 142/90. She reports being assessed by Paramedics at the Heart Walk Saturday. This morning, she shared she did not seek out further evaluation over the weekend. She is alert and oriented to person, place, and time. Due to the discomfort and swelling she is experiencing Cardiac rehab exercise held today. Encouraged her to ice and elevate knee and ice swollen area of right temple. Highly recommended to her she follow-up with her Primary physician today for additional evaluation.

## 2022-11-06 ENCOUNTER — Encounter (HOSPITAL_COMMUNITY)
Admission: RE | Admit: 2022-11-06 | Discharge: 2022-11-06 | Disposition: A | Discharge status: Home / Self Care | Payer: 59 | Source: Ambulatory Visit | Attending: Cardiology | Admitting: Cardiology

## 2022-11-06 DIAGNOSIS — Z48812 Encounter for surgical aftercare following surgery on the circulatory system: Secondary | ICD-10-CM | POA: Diagnosis not present

## 2022-11-06 DIAGNOSIS — Z951 Presence of aortocoronary bypass graft: Secondary | ICD-10-CM

## 2022-11-08 ENCOUNTER — Encounter (HOSPITAL_COMMUNITY): Payer: 59

## 2022-11-11 ENCOUNTER — Encounter (HOSPITAL_COMMUNITY): Payer: 59

## 2022-11-13 ENCOUNTER — Encounter (HOSPITAL_COMMUNITY)
Admission: RE | Admit: 2022-11-13 | Discharge: 2022-11-13 | Disposition: A | Discharge status: Home / Self Care | Payer: 59 | Source: Ambulatory Visit | Attending: Cardiology | Admitting: Cardiology

## 2022-11-13 DIAGNOSIS — Z48812 Encounter for surgical aftercare following surgery on the circulatory system: Secondary | ICD-10-CM | POA: Diagnosis not present

## 2022-11-13 DIAGNOSIS — Z951 Presence of aortocoronary bypass graft: Secondary | ICD-10-CM

## 2022-11-14 NOTE — Progress Notes (Signed)
Cardiac Individual Treatment Plan  Patient Details  Name: TINAYA SESSUMS MRN: [id number] Date of Birth: Jan 15, 1961 Referring Provider:   Flowsheet Row INTENSIVE CARDIAC REHAB ORIENT from 09/02/2022 in [facility]  Referring Provider Yates Decamp, MD       Initial Encounter Date:  Flowsheet Row INTENSIVE CARDIAC REHAB ORIENT from 09/02/2022 in [facility], [facility]ascular, & [facility]  Date 09/02/22       Visit Diagnosis: S/P CABG x 2  Patient's Home Medications on Admission:  Current Outpatient Medications:    Alpha Lipoic Acid 200 MG CAPS, Take 200 mg by mouth at bedtime., Disp: , Rfl:    aspirin 81 MG chewable tablet, Chew 81 mg by mouth in the morning., Disp: , Rfl:    atorvastatin (LIPITOR) 40 MG tablet, Take 40 mg by mouth at bedtime., Disp: , Rfl:    Biotin 40981 MCG TABS, Take 10,000 mcg by mouth every evening., Disp: , Rfl:    cetirizine (ZYRTEC) 10 MG tablet, Take 10 mg by mouth in the morning., Disp: , Rfl:    Cholecalciferol 25 MCG (1000 UT) capsule, Take 1,000 Units by mouth 2 (two) times daily., Disp: , Rfl:    clopidogrel (PLAVIX) 75 MG tablet, Take 1 tablet (75 mg total) by mouth daily., Disp: 90 tablet, Rfl: 1   CRANBERRY PO, Take 15,000 mg by mouth in the morning., Disp: , Rfl:    denosumab (PROLIA) 60 MG/ML SOSY injection, Inject 60 mg into the skin every 6 (six) months., Disp: , Rfl:    DULoxetine (CYMBALTA) 60 MG capsule, Take 60 mg by mouth 2 (two) times daily., Disp: , Rfl:    furosemide (LASIX) 20 MG tablet, TAKE 1 TABLET BY MOUTH ONCE DAILY AS NEEDED FOR FLUID, Disp: 46 tablet, Rfl: 2   gabapentin (NEURONTIN) 800 MG tablet, Take 400 mg by mouth at bedtime., Disp: , Rfl:    HUMALOG KWIKPEN 100 UNIT/ML KwikPen, Inject 5-12 Units into the skin 3 (three) times daily as needed (for a BGL greater than 100)., Disp: , Rfl:    insulin glargine, 2 Unit Dial, (TOUJEO MAX SOLOSTAR) 300  UNIT/ML Solostar Pen, Inject 10 Units into the skin at bedtime. (Patient taking differently: Inject 45 Units into the skin at bedtime.), Disp: , Rfl:    metoprolol tartrate (LOPRESSOR) 25 MG tablet, Take 1 tablet by mouth twice daily, Disp: 180 tablet, Rfl: 0   Multiple Vitamins-Minerals (CENTRUM WOMEN) TABS, Take 1 tablet by mouth daily with breakfast., Disp: , Rfl:    Nutritional Supplements (EQUATE PO), Take by mouth., Disp: , Rfl:    pantoprazole (PROTONIX) 40 MG tablet, Take 1 tablet (40 mg total) by mouth 2 (two) times daily. (Patient taking differently: Take 40 mg by mouth daily.), Disp: 60 tablet, Rfl: 0   polyethylene glycol powder (GLYCOLAX/MIRALAX) 17 GM/SCOOP powder, Take 1 Container by mouth once., Disp: , Rfl:    potassium chloride (KLOR-CON) 10 MEQ tablet, TAKE 1 TABLET BY MOUTH ONCE DAILY AS NEEDED WITH FUROSEMIDE ONLY, Disp: 30 tablet, Rfl: 0   RYBELSUS 3 MG TABS, Take 1 tablet by mouth daily., Disp: , Rfl:    traMADol (ULTRAM) 50 MG tablet, Take 1 tablet (50 mg total) by mouth 3 (three) times daily as needed for severe pain (Back pain)., Disp: 90 tablet, Rfl: 1   zinc gluconate 50 MG tablet, Take 50 mg by mouth in the morning., Disp: , Rfl:  Past Medical History: 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/2022   Shoulder pain    Thyroid disease    Trochanteric bursitis of left hip    Vaginal cysts    4 have been removed    Tobacco Use: Social History   Tobacco Use   Smoking Status Never  Smokeless Tobacco Never    Labs: Review Flowsheet  More data exists      Latest Ref Rng & Units 10/31/2016 02/13/2021 08/02/2021 08/04/2021 01/19/2022  Labs for ITP Cardiac and Pulmonary Rehab  Hemoglobin A1c 4.8 - 5.6 % 12.3  7.1  - 6.6  -  Bicarbonate 20.0 - 28.0 mmol/L - - 44.2  42.1  - -  TCO2 22 - 32 mmol/L - - 44  - 28   O2 Saturation % - - 55.1  91  - -    Details       Multiple values from one day are sorted in reverse-chronological order         Capillary Blood Glucose: Lab Results  Component Value Date   GLUCAP 154 (H) 10/11/2022   GLUCAP 187 (H) 10/11/2022   GLUCAP 159 (H) 09/11/2022   GLUCAP 82 09/09/2022   GLUCAP 143 (H) 09/09/2022     Exercise Target Goals: Exercise Program Goal: Individual exercise prescription set using results from initial 6 min walk test and THRR while considering  patient's activity barriers and safety.   Exercise Prescription Goal: Initial exercise prescription builds to 30-45 minutes a day of aerobic activity, 2-3 days per week.  Home exercise guidelines will be given to patient during program as part of exercise prescription that the participant will acknowledge.  Activity Barriers & Risk Stratification:  Activity Barriers & Cardiac Risk Stratification - 09/02/22 1206       Activity Barriers & Cardiac Risk Stratification   Activity Barriers Back Problems;Arthritis;Neck/Spine Problems;History of Falls;Balance Concerns;Deconditioning;Joint Problems    Cardiac Risk Stratification High   <5 METs on            6 Minute Walk:  6 Minute Walk     Row Name 09/02/22 1205         6 Minute Walk   Phase Initial     Distance 1680 feet     Walk Time 6 minutes     # of Rest Breaks 0     MPH 3.18     METS 3.82     RPE 13     Perceived Dyspnea  0     VO2 Peak 13.39     Symptoms Yes (comment)     Comments 6/10 Bilateral hip pain     Resting HR 92 bpm     Resting BP 110/74     Resting Oxygen  Saturation  96 %     Exercise Oxygen Saturation  during  6 min walk 98 %     Max Ex. HR 114 bpm     Max Ex. BP 146/82     2 Minute Post BP 124/82              Oxygen Initial Assessment:   Oxygen Re-Evaluation:   Oxygen Discharge (Final Oxygen Re-Evaluation):   Initial Exercise Prescription:  Initial Exercise Prescription - 09/02/22 1200       Date of Initial Exercise RX and Referring Provider   Date 09/02/22    Referring Provider Yates Decamp, MD    Expected Discharge Date 11/27/22      Recumbant Bike   Level 1    RPM 55    Watts 30    Minutes 15    METs 2.5      NuStep   Level 1    SPM 60    Minutes 15    METs 2      Prescription Details   Frequency (times per week) 3    Duration Progress to 30 minutes of continuous aerobic without signs/symptoms of physical distress      Intensity   THRR 40-80% of Max Heartrate 64-127    Ratings of Perceived Exertion 11-13    Perceived Dyspnea 0-4      Progression   Progression Continue progressive overload as per policy without signs/symptoms or physical distress.      Resistance Training   Training Prescription Yes    Weight 2    Reps 10-15             Perform Capillary Blood Glucose checks as needed.  Exercise Prescription Changes:   Exercise Prescription Changes     Row Name 09/09/22 1300 09/23/22 1202 10/02/22 0830 10/23/22 0820 11/06/22 0828     Response to Exercise   Blood Pressure (Admit) 122/72 122/66 124/74 128/80 120/76   Blood Pressure (Exercise) 130/68 130/60 104/64 130/70 132/80   Blood Pressure (Exit) 108/74 108/72 98/60 106/70 122/78   Heart Rate (Admit) 96 bpm 78 bpm 90 bpm 96 bpm 95 bpm   Heart Rate (Exercise) 109 bpm 105 bpm 114 bpm 121 bpm 117 bpm   Heart Rate (Exit) 97 bpm 87 bpm 95 bpm 97 bpm 93 bpm   Rating of Perceived Exertion (Exercise) 8 11.5 11 11  11.5   Perceived Dyspnea (Exercise) -- 0 0 0 0   Symptoms none chronic back pain chronic back pain chronic back pain chronic back  pain   Comments skipped weights d/t low BS REVD MET's and goals REVD MET's REVD MET's and goals REVD MET's   Duration Continue with 30 min of aerobic exercise without signs/symptoms of physical distress. Continue with 30 min of aerobic exercise without signs/symptoms of physical distress. Continue with 30 min of aerobic exercise without signs/symptoms of physical distress. Continue with 30 min of aerobic exercise without signs/symptoms of physical distress. Continue with 30 min of aerobic exercise without signs/symptoms of physical distress.   Intensity THRR unchanged THRR unchanged THRR unchanged THRR unchanged THRR unchanged     Progression   Progression Continue to progress workloads to maintain intensity without signs/symptoms of physical distress. Continue to progress workloads to maintain intensity without signs/symptoms of physical distress. Continue to progress workloads to maintain intensity without signs/symptoms of physical distress. Continue to progress workloads to maintain intensity without signs/symptoms of physical distress. Continue to progress workloads to maintain intensity without signs/symptoms of physical distress.   Average METs 2.4 2.5 2.5 2.6 3.3  Resistance Training   Training Prescription No  Low BS No  Low BS Yes  Low BS No No   Weight -- -- 2 lbs wts -- --   Reps -- -- 10-15 -- --   Time -- -- 10 Minutes -- --     Recumbant Bike   Level 1 1 1 2 3    RPM 73 86 86 99 --   Watts 14 21 21  33 --   Minutes 15 15 15 15 15    METs 2.5 2.3 2.3 2.7 3.6     NuStep   Level 1 1 1 2 2    SPM 78 98 98 98 106   Minutes 15 15 15 15 15    METs 2.2 2.7 2.7 2.9 3    Row Name 11/15/22 1422             Response to Exercise   Blood Pressure (Admit) 124/80       Blood Pressure (Exercise) --  Over 10 sessions no exercise BP needed       Blood Pressure (Exit) 110/70       Heart Rate (Admit) 100 bpm       Heart Rate (Exercise) 123 bpm       Heart Rate (Exit) 103 bpm        Rating of Perceived Exertion (Exercise) 11.5       Perceived Dyspnea (Exercise) 0       Symptoms chronic back pain       Comments REVD MET's and goals       Duration Continue with 30 min of aerobic exercise without signs/symptoms of physical distress.       Intensity THRR unchanged         Progression   Progression Continue to progress workloads to maintain intensity without signs/symptoms of physical distress.       Average METs 3.65         Resistance Training   Training Prescription Yes       Weight 2 lbs       Reps 10-15       Time 10 Minutes         Recumbant Bike   Level 3       RPM 82       Watts 47       Minutes 15       METs 3.7         NuStep   Level 2       SPM 121       Minutes 15       METs 3.6                Exercise Comments:   Exercise Comments     Row Name 09/09/22 1315 09/23/22 1205 10/02/22 0838 10/23/22 0826 11/06/22 0832   Exercise Comments Pt first day in program, pt was educated on THRR, RPE, and METs. Pt exercised at an avg of 2.2 METs on the NS and 2.5 METs on the RB. Will increase WL as tolerated. Reviewed MET's and goals. Pt tolerated exercise well with an average MET level of 2.5. Pt feels good about her goals and is increasing strength and stamina Reviewed MET's. Pt tolerated exercise well with an average MET level of 2.5. Pt is doing well, she deals with chronic back pain. She is awaiting MRI results. Will review home exercise soon, just continue to walk as able for now. REVD MET's and goals. Pt tolerated exercise well  with an average MET level of 2.6. REVD MET's. Pt tolerated exercise well with an average MET level of 3.3. She is doing very well and is increasing MET's and WL's as tolerated    Row Name 11/15/22 1427           Exercise Comments REVD MET's and goals. Pt tolerated exercise well with an average MET level of 3.65. She continues to deal with chronic back pain, but is doing well and has increased WL's and plasn to move up next  session. Sh says she's recieved a lot of good diet info from the RD on heart healthy and DM. She's continuing to work on forming healthy habits. Overall doing very well and progressing MET's                Exercise Goals and Review:   Exercise Goals     Row Name 09/02/22 0804             Exercise Goals   Increase Physical Activity Yes       Intervention Provide advice, education, support and counseling about physical activity/exercise needs.;Develop an individualized exercise prescription for aerobic and resistive training based on initial evaluation findings, risk stratification, comorbidities and participant's personal goals.       Expected Outcomes Short Term: Attend rehab on a regular basis to increase amount of physical activity.;Long Term: Exercising regularly at least 3-5 days a week.;Long Term: Add in home exercise to make exercise part of routine and to increase amount of physical activity.       Increase Strength and Stamina Yes       Intervention Provide advice, education, support and counseling about physical activity/exercise needs.;Develop an individualized exercise prescription for aerobic and resistive training based on initial evaluation findings, risk stratification, comorbidities and participant's personal goals.       Expected Outcomes Short Term: Increase workloads from initial exercise prescription for resistance, speed, and METs.;Short Term: Perform resistance training exercises routinely during rehab and add in resistance training at home;Long Term: Improve cardiorespiratory fitness, muscular endurance and strength as measured by increased METs and functional capacity ( )       Able to understand and use rate of perceived exertion (RPE) scale Yes       Intervention Provide education and explanation on how to use RPE scale       Expected Outcomes Short Term: Able to use RPE daily in rehab to express subjective intensity level;Long Term:  Able to use RPE to guide  intensity level when exercising independently       Knowledge and understanding of Target Heart Rate Range (THRR) Yes       Intervention Provide education and explanation of THRR including how the numbers were predicted and where they are located for reference       Expected Outcomes Short Term: Able to state/look up THRR;Short Term: Able to use daily as guideline for intensity in rehab;Long Term: Able to use THRR to govern intensity when exercising independently       Understanding of Exercise Prescription Yes       Intervention Provide education, explanation, and written materials on patient's individual exercise prescription       Expected Outcomes Short Term: Able to explain program exercise prescription;Long Term: Able to explain home exercise prescription to exercise independently                Exercise Goals Re-Evaluation :  Exercise Goals Re-Evaluation     Row Name 09/09/22 1310  10/23/22 1610 11/15/22 1425         Exercise Goal Re-Evaluation   Exercise Goals Review Increase Physical Activity;Understanding of Exercise Prescription;Increase Strength and Stamina;Knowledge and understanding of Target Heart Rate Range (THRR);Able to understand and use rate of perceived exertion (RPE) scale Increase Physical Activity;Understanding of Exercise Prescription;Increase Strength and Stamina;Knowledge and understanding of Target Heart Rate Range (THRR);Able to understand and use rate of perceived exertion (RPE) scale Increase Physical Activity;Understanding of Exercise Prescription;Increase Strength and Stamina;Knowledge and understanding of Target Heart Rate Range (THRR);Able to understand and use rate of perceived exertion (RPE) scale     Comments Pt first day in program, pt was educated on THRR, RPE, and METs. Pt exercised at an avg of 2.2 METs on the NS and 2.5 METs on the RB. Pt had to skip weights d/t her BS dropping, pt received glucose gel and other snacks to raise BS. Pt was asymptomatic.  REVD MET's and goals. Pt tolerated exercise well with an average MET level of 2.6. She deals with chronic back pain, but is doing well and has increased WL's on both stations and is doing well with the change. She also states she has recieved many resources for ongoing diet information. REVD MET's and goals. Pt tolerated exercise well with an average MET level of 3.65. She continues to deal with chronic back pain, but is doing well and has increased WL's and plasn to move up next session. Sh says she's recieved a lot of good diet info from the RD on heart healthy and DM. She's continuing to work on forming healthy habits. Overall doing very well and progressing MET's     Expected Outcomes Will increase WL as tolerated barring s/s. Will continue to monitor pt and progress workloads as tolerated without sign or symptom Will continue to monitor pt and progress workloads as tolerated without sign or symptom              Discharge Exercise Prescription (Final Exercise Prescription Changes):  Exercise Prescription Changes - 11/15/22 1422       Response to Exercise   Blood Pressure (Admit) 124/80    Blood Pressure (Exercise) --   Over 10 sessions no exercise BP needed   Blood Pressure (Exit) 110/70    Heart Rate (Admit) 100 bpm    Heart Rate (Exercise) 123 bpm    Heart Rate (Exit) 103 bpm    Rating of Perceived Exertion (Exercise) 11.5    Perceived Dyspnea (Exercise) 0    Symptoms chronic back pain    Comments REVD MET's and goals    Duration Continue with 30 min of aerobic exercise without signs/symptoms of physical distress.    Intensity THRR unchanged      Progression   Progression Continue to progress workloads to maintain intensity without signs/symptoms of physical distress.    Average METs 3.65      Resistance Training   Training Prescription Yes    Weight 2 lbs    Reps 10-15    Time 10 Minutes      Recumbant Bike   Level 3    RPM 82    Watts 47    Minutes 15    METs 3.7       NuStep   Level 2    SPM 121    Minutes 15    METs 3.6             Nutrition:  Target Goals: Understanding of nutrition guidelines, daily intake of sodium <  1500mg , cholesterol 200mg , calories 30% from fat and 7% or less from saturated fats, daily to have 5 or more servings of fruits and vegetables.  Biometrics:  Pre Biometrics - 09/02/22 0829       Pre Biometrics   Waist Circumference 42.5 inches    Hip Circumference 49 inches    Waist to Hip Ratio 0.87 %    Triceps Skinfold 45 mm    % Body Fat 48.1 %    Grip Strength 22 kg    Flexibility 0 in   pt has chronic back issues, not done   Single Leg Stand 5 seconds              Nutrition Therapy Plan and Nutrition Goals:  Nutrition Therapy & Goals - 11/04/22 1119       Nutrition Therapy   Diet Heart Healthy Diet    Drug/Food Interactions Statins/Certain Fruits      Personal Nutrition Goals   Nutrition Goal Patient to identify strategies for reducing cardiovascular risk by attending the [employer] education and nutrition series weekly.   goal in progress.   Personal Goal #2 Patient to improve diet quality by using the plate method as a guide for meal planning to include lean protein/plant protein, fruits, vegetables, whole grains, nonfat dairy as part of a well-balanced diet.   goal in progress.   Personal Goal #3 Patient to reduce sodium intake to 1500mg  per day   goal in progress   Personal Goal #4 Patient to identify strategies for blood sugar control with goal A1c <7%.   goal in progress   Comments Goals in progress. Lelia continues to attend the [employer] education and nutrition series as able.  She has started making some dietary changes including increased fiber intake, decreased "grazing"/snacking behaviors, and reading food labels for sodium/sugar. She is down 5.1# since starting with our program. Patient will benefit from participation in intensive cardiac rehab for nutrition, exercise, and lifestyle  modification.      Intervention Plan   Intervention Prescribe, educate and counsel regarding individualized specific dietary modifications aiming towards targeted core components such as weight, hypertension, lipid management, diabetes, heart failure and other comorbidities.;Nutrition handout(s) given to patient.    Expected Outcomes Short Term Goal: Understand basic principles of dietary content, such as calories, fat, sodium, cholesterol and nutrients.;Long Term Goal: Adherence to prescribed nutrition plan.             Nutrition Assessments:  Nutrition Assessments - 09/09/22 0913       Rate Your Plate Scores   Pre Score 63            MEDIFICTS Score Key: >=70 Need to make dietary changes  40-70 Heart Healthy Diet <= 40 Therapeutic Level Cholesterol Diet   Flowsheet Row INTENSIVE CARDIAC REHAB from 09/09/2022 in [facility]  Picture Your Plate Total Score on Admission 63      Picture Your Plate Scores: <60 Unhealthy dietary pattern with much room for improvement. 41-50 Dietary pattern unlikely to meet recommendations for good health and room for improvement. 51-60 More healthful dietary pattern, with some room for improvement.  >60 Healthy dietary pattern, although there may be some specific behaviors that could be improved.    Nutrition Goals Re-Evaluation:  Nutrition Goals Re-Evaluation     Row Name 09/09/22 0904 10/07/22 0948 11/04/22 1119         Goals   Current Weight 196 lb 10.4 oz (89.2  kg) 193 lb 2 oz (87.6 kg) 195 lb 8.8 oz (88.7 kg)     Comment A1c 7.0, lipoproteinA 104.5, LDL 71 no new labs; most recent labs  A1c 7.0, lipoproteinA 104.5, LDL 71 no new labs; most recent labs A1c 7.0, lipoproteinA 104.5, LDL 71     Expected Outcome Miquela is motivated to improve eating habits, decrease sweets/simple sugar intake, and eat on a regular/consistent schedule. Patient will benefit from participation in  intensive cardiac rehab for nutrition, exercise, and lifestyle modification Goals in progress. Johna has attended 5 [employer] education/nutrition series classes. She has started making some dietary changes including increased fiber intake, decreased "grazing"/snacking behaviors, and reading food labels for sodium. She is down 7.5# since starting with our program. Patient will benefit from participation in intensive cardiac rehab for nutrition, exercise, and lifestyle modification. Goals in progress. Gwendola continues to attend the [employer] education and nutrition series as able. She has started making some dietary changes including increased fiber intake, decreased "grazing"/snacking behaviors, and reading food labels for sodium/sugar. She is down 5.1# since starting with our program. LDL and A1c remain above goal. Patient will benefit from participation in intensive cardiac rehab for nutrition, exercise, and lifestyle modificatio              Nutrition Goals Re-Evaluation:  Nutrition Goals Re-Evaluation     Row Name 09/09/22 0904 10/07/22 0948 11/04/22 1119         Goals   Current Weight 196 lb 10.4 oz (89.2 kg) 193 lb 2 oz (87.6 kg) 195 lb 8.8 oz (88.7 kg)     Comment A1c 7.0, lipoproteinA 104.5, LDL 71 no new labs; most recent labs  A1c 7.0, lipoproteinA 104.5, LDL 71 no new labs; most recent labs A1c 7.0, lipoproteinA 104.5, LDL 71     Expected Outcome Norvella is motivated to improve eating habits, decrease sweets/simple sugar intake, and eat on a regular/consistent schedule. Patient will benefit from participation in intensive cardiac rehab for nutrition, exercise, and lifestyle modification Goals in progress. Breleigh has attended 5 [employer] education/nutrition series classes. She has started making some dietary changes including increased fiber intake, decreased "grazing"/snacking behaviors, and reading food labels for sodium. She is down 7.5# since starting with our program. Patient will  benefit from participation in intensive cardiac rehab for nutrition, exercise, and lifestyle modification. Goals in progress. Rodney continues to attend the [employer] education and nutrition series as able. She has started making some dietary changes including increased fiber intake, decreased "grazing"/snacking behaviors, and reading food labels for sodium/sugar. She is down 5.1# since starting with our program. LDL and A1c remain above goal. Patient will benefit from participation in intensive cardiac rehab for nutrition, exercise, and lifestyle modificatio              Nutrition Goals Discharge (Final Nutrition Goals Re-Evaluation):  Nutrition Goals Re-Evaluation - 11/04/22 1119       Goals   Current Weight 195 lb 8.8 oz (88.7 kg)    Comment no new labs; most recent labs A1c 7.0, lipoproteinA 104.5, LDL 71    Expected Outcome Goals in progress. Lisel continues to attend the [employer] education and nutrition series as able. She has started making some dietary changes including increased fiber intake, decreased "grazing"/snacking behaviors, and reading food labels for sodium/sugar. She is down 5.1# since starting with our program. LDL and A1c remain above goal. Patient will benefit from participation in intensive cardiac rehab for nutrition, exercise, and lifestyle modificatio  Psychosocial: Target Goals: Acknowledge presence or absence of significant depression and/or stress, maximize coping skills, provide positive support system. Participant is able to verbalize types and ability to use techniques and skills needed for reducing stress and depression.  Initial Review & Psychosocial Screening:  Initial Psych Review & Screening - 09/02/22 0931       Initial Review   Current issues with Current Anxiety/Panic;Current Psychotropic Meds;Current Stress Concerns;Current Depression    Source of Stress Concerns Chronic Illness;Financial    Comments Elaysia shared that she has felt  some low levels of depression since her CAD diagnosis and post surgery. She has struggled to adjust to the lifestyle changes and learning what she can control vs. not control. She is taking Cymbalta and feels it is helping, does not feel she needs additional support. Jekia is also very unhappy with her physical appearance.      Family Dynamics   Good Support System? Yes   Yairis has her sisters and daughter for support     Barriers   Psychosocial barriers to participate in program The patient should benefit from training in stress management and relaxation.      Screening Interventions   Interventions Encouraged to exercise;Provide feedback about the scores to participant;To provide support and resources with identified psychosocial needs    Expected Outcomes Long Term Goal: Stressors or current issues are controlled or eliminated.;Short Term goal: Identification and review with participant of any Quality of Life or Depression concerns found by scoring the questionnaire.;Long Term goal: The participant improves quality of Life and PHQ9 Scores as seen by post scores and/or verbalization of changes             Quality of Life Scores:  Quality of Life - 09/02/22 1214       Quality of Life   Select Quality of Life      Quality of Life Scores   Health/Function Pre 18.67 %    Socioeconomic Pre 25 %    Psych/Spiritual Pre 18.5 %    Family Pre 24.5 %    GLOBAL Pre 20.79 %            Scores of 19 and below usually indicate a poorer quality of life in these areas.  A difference of  2-3 points is a clinically meaningful difference.  A difference of 2-3 points in the total score of the Quality of Life Index has been associated with significant improvement in overall quality of life, self-image, physical symptoms, and general health in studies assessing change in quality of life.  PHQ-9: Review Flowsheet       09/02/2022  Depression screen PHQ 2/9  Decreased Interest 0  Down,  Depressed, Hopeless 1  PHQ - 2 Score 1  Altered sleeping 1  Tired, decreased energy 1  Change in appetite 1  Feeling bad or failure about yourself  1  Trouble concentrating 0  Moving slowly or fidgety/restless 0  Suicidal thoughts 0  PHQ-9 Score 5  Difficult doing work/chores Somewhat difficult    Details           Interpretation of Total Score  Total Score Depression Severity:  1-4 = Minimal depression, 5-9 = Mild depression, 10-14 = Moderate depression, 15-19 = Moderately severe depression, 20-27 = Severe depression   Psychosocial Evaluation and Intervention:   Psychosocial Re-Evaluation:  Psychosocial Re-Evaluation     Row Name 09/20/22 0958 10/22/22 0811 11/14/22 1036         Psychosocial Re-Evaluation   Current  issues with Current Abuse or Neglect to Report;Current Stress Concerns;Current Anxiety/Panic;Current Psychotropic Meds Current Stress Concerns;Current Anxiety/Panic;Current Psychotropic Meds Current Stress Concerns;Current Anxiety/Panic;Current Psychotropic Meds     Comments Quality of life questionnarie reviewed on 09/11/22. Courteny has good family support and has returned to work Sarra has not voiced any increased concerns or stressors during exercise at cardiac rehab. Kauri continues not to  voice any increased concerns or stressors during exercise at cardiac rehab.     Expected Outcomes Johnsie will have decreased concerns/ stressors upon completion of cardiac rehab Johnsie will have decreased concerns/ stressors upon completion of cardiac rehab Johnsie will have decreased concerns/ stressors upon completion of cardiac rehab     Interventions Stress management education;Relaxation education;Encouraged to attend Cardiac Rehabilitation for the exercise Stress management education;Relaxation education;Encouraged to attend Cardiac Rehabilitation for the exercise Stress management education;Relaxation education;Encouraged to attend Cardiac Rehabilitation for the  exercise     Continue Psychosocial Services  Follow up required by staff No Follow up required No Follow up required       Initial Review   Source of Stress Concerns Chronic Illness;Unable to perform yard/household activities;Unable to participate in former interests or hobbies Chronic Illness;Unable to perform yard/household activities;Unable to participate in former interests or hobbies Chronic Illness;Unable to perform yard/household activities;Unable to participate in former interests or hobbies     Comments Will continue to monitor and offer support as needed Will continue to monitor and offer support as needed Will continue to monitor and offer support as needed              Psychosocial Discharge (Final Psychosocial Re-Evaluation):  Psychosocial Re-Evaluation - 11/14/22 1036       Psychosocial Re-Evaluation   Current issues with Current Stress Concerns;Current Anxiety/Panic;Current Psychotropic Meds    Comments Annahi continues not to  voice any increased concerns or stressors during exercise at cardiac rehab.    Expected Outcomes Johnsie will have decreased concerns/ stressors upon completion of cardiac rehab    Interventions Stress management education;Relaxation education;Encouraged to attend Cardiac Rehabilitation for the exercise    Continue Psychosocial Services  No Follow up required      Initial Review   Source of Stress Concerns Chronic Illness;Unable to perform yard/household activities;Unable to participate in former interests or hobbies    Comments Will continue to monitor and offer support as needed             Vocational Rehabilitation: Provide vocational rehab assistance to qualifying candidates.   Vocational Rehab Evaluation & Intervention:  Vocational Rehab - 09/02/22 0840       Initial Vocational Rehab Evaluation & Intervention   Assessment shows need for Vocational Rehabilitation No   Kenyanna works for [employer]             Education: Education Goals: Education classes will be provided on a weekly basis, covering required topics. Participant will state understanding/return demonstration of topics presented.    Education     Row Name 09/11/22 1000     Education   Cardiac Education Topics Pritikin   Secondary school teacher School   Educator Dietitian   Weekly Topic Powerhouse Plant-Based Proteins   Instruction Review Code 1- Verbalizes Understanding   Class Start Time 0815   Class Stop Time 0856   Class Time Calculation (min) 41 min    Row Name 09/13/22 0900     Education   Cardiac Education Topics Pritikin   Select Core Videos  Core Videos   Educator Exercise Industrial/product designer Education   General Education Hypertension and Heart Disease   Instruction Review Code 1- Verbalizes Understanding   Class Start Time [phone]   Class Stop Time 0850   Class Time Calculation (min) 40 min    Row Name 09/23/22 1000     Education   Cardiac Education Topics Pritikin   Select Core Videos     Core Videos   Educator Dietitian   Select Nutrition   Nutrition Overview of the Pritikin Eating Plan   Instruction Review Code 1- Verbalizes Understanding   Class Start Time 0815   Class Stop Time 0850   Class Time Calculation (min) 35 min    Row Name 09/25/22 1100     Education   Cardiac Education Topics Pritikin   Orthoptist   Educator Dietitian   Weekly Topic Fast and Healthy Breakfasts   Instruction Review Code 1- Verbalizes Understanding   Class Start Time 0815   Class Stop Time 0847   Class Time Calculation (min) 32 min    Row Name 10/04/22 0900     Education   Cardiac Education Topics Pritikin   Select Workshops     Workshops   Educator Exercise Physiologist   Select Exercise   Exercise Workshop Location manager and Fall Prevention   Instruction Review Code 1- Verbalizes Understanding   Class Start Time [phone]   Class Stop Time  0856   Class Time Calculation (min) 45 min    Row Name 10/07/22 0900     Education   Cardiac Education Topics Pritikin   Glass blower/designer Nutrition   Nutrition Workshop Label Reading   Instruction Review Code 1- Verbalizes Understanding   Class Start Time 0815   Class Stop Time 0904   Class Time Calculation (min) 49 min    Row Name 10/09/22 1100     Education   Cardiac Education Topics Pritikin   Customer service manager   Weekly Topic [employer] Desserts   Instruction Review Code 1- Verbalizes Understanding   Class Start Time 0820   Class Stop Time 0900   Class Time Calculation (min) 40 min    Row Name 10/11/22 1000     Education   Cardiac Education Topics Pritikin   Nurse, learning disability Nutrition   Nutrition Other  Label Reading   Instruction Review Code 1- Verbalizes Understanding   Class Start Time 0815   Class Stop Time 0900   Class Time Calculation (min) 45 min    Row Name 10/21/22 0900     Education   Cardiac Education Topics Pritikin   Select Workshops     Workshops   Educator Exercise Physiologist   Select Exercise   Exercise Workshop Exercise Basics: Building Your Action Plan   Instruction Review Code 1- Verbalizes Understanding   Class Start Time 0813   Class Stop Time 0900   Class Time Calculation (min) 47 min    Row Name 10/25/22 0800     Education   Cardiac Education Topics Pritikin   Psychologist, forensic Exercise Education   Exercise Education Move It!   Instruction Review Code 1- Verbalizes Understanding  Class Start Time 0812   Class Stop Time 0845   Class Time Calculation (min) 33 min    Row Name 10/28/22 1000     Education   Cardiac Education Topics Pritikin   Glass blower/designer Nutrition    Nutrition Workshop Targeting Your Nutrition Priorities   Instruction Review Code 1- Verbalizes Understanding   Class Start Time 0815   Class Stop Time 0900   Class Time Calculation (min) 45 min     Secondary school teacher    Row Name 10/30/22 0900     Education   Cardiac Education Topics Pritikin   Secondary school teacher School   Educator Dietitian   Weekly Topic One-Pot Wonders   Instruction Review Code 1- Verbalizes Understanding   Class Start Time 0815   Class Stop Time 0855   Class Time Calculation (min) 40 min    Row Name 11/13/22 0800     Education   Cardiac Education Topics Pritikin   Select Core Videos     Core Videos   Educator Exercise Physiologist   Select Nutrition   Nutrition Vitamins and Minerals   Instruction Review Code 1- Verbalizes Understanding   Class Start Time [phone]   Class Stop Time 0902   Class Time Calculation (min) 48 min    Row Name 11/15/22 0900     Education   Cardiac Education Topics Pritikin   Orthoptist   Educator Dietitian   Weekly Topic Fast Evening Meals   Instruction Review Code 1- Verbalizes Understanding   Class Start Time 0815   Class Stop Time 0850   Class Time Calculation (min) 35 min            Core Videos: Exercise    Move It!  Clinical staff conducted group or individual video education with verbal and written material and guidebook.  Patient learns the recommended Pritikin exercise program. Exercise with the goal of living a long, healthy life. Some of the health benefits of exercise include controlled diabetes, healthier blood pressure levels, improved cholesterol levels, improved heart and lung capacity, improved sleep, and better body composition. Everyone should speak with their doctor before starting or changing an exercise routine.  Biomechanical Limitations Clinical staff conducted group or individual video education with verbal and written material  and guidebook.  Patient learns how biomechanical limitations can impact exercise and how we can mitigate and possibly overcome limitations to have an impactful and balanced exercise routine.  Body Composition Clinical staff conducted group or individual video education with verbal and written material and guidebook.  Patient learns that body composition (ratio of muscle mass to fat mass) is a key component to assessing overall fitness, rather than body weight alone. Increased fat mass, especially visceral belly fat, can put [place] at increased risk for metabolic syndrome, type 2 diabetes, heart disease, and even death. It is recommended to combine diet and exercise (cardiovascular and resistance training) to improve your body composition. Seek guidance from your physician and exercise physiologist before implementing an exercise routine.  Exercise Action Plan Clinical staff conducted group or individual video education with verbal and written material and guidebook.  Patient learns the recommended strategies to achieve and enjoy long-term exercise adherence, including variety, self-motivation, self-efficacy, and positive decision making. Benefits of exercise include fitness, good health, weight management, more energy, better sleep, less stress, and  overall well-being.  Medical   Heart Disease Risk Reduction Clinical staff conducted group or individual video education with verbal and written material and guidebook.  Patient learns our heart is our most vital organ as it circulates oxygen, nutrients, white blood cells, and hormones throughout the entire body, and carries waste away. Data supports a plant-based eating plan like the Pritikin Program for its effectiveness in slowing progression of and reversing heart disease. The video provides a number of recommendations to address heart disease.   Metabolic Syndrome and Belly Fat  Clinical staff conducted group or individual video education with verbal  and written material and guidebook.  Patient learns what metabolic syndrome is, how it leads to heart disease, and how one can reverse it and keep it from coming back. You have metabolic syndrome if you have 3 of the following 5 criteria: abdominal obesity, high blood pressure, high triglycerides, low HDL cholesterol, and high blood sugar.  Hypertension and Heart Disease Clinical staff conducted group or individual video education with verbal and written material and guidebook.  Patient learns that high blood pressure, or hypertension, is very common in the [place]. Hypertension is largely due to excessive salt intake, but other important risk factors include being overweight, physical inactivity, drinking too much alcohol, smoking, and not eating enough potassium from fruits and vegetables. High blood pressure is a leading risk factor for heart attack, stroke, congestive heart failure, dementia, kidney failure, and premature death. Long-term effects of excessive salt intake include stiffening of the arteries and thickening of heart muscle and organ damage. Recommendations include ways to reduce hypertension and the risk of heart disease.  Diseases of Our Time - Focusing on Diabetes Clinical staff conducted group or individual video education with verbal and written material and guidebook.  Patient learns why the best way to stop diseases of our time is prevention, through food and other lifestyle changes. Medicine (such as prescription pills and surgeries) is often only a Band-Aid on the problem, not a long-term solution. Most common diseases of our time include obesity, type 2 diabetes, hypertension, heart disease, and cancer. The Pritikin Program is recommended and has been proven to help reduce, reverse, and/or prevent the damaging effects of metabolic syndrome.  Nutrition   Overview of the Pritikin Eating Plan  Clinical staff conducted group or individual video education with verbal and  written material and guidebook.  Patient learns about the Pritikin Eating Plan for disease risk reduction. The Pritikin Eating Plan emphasizes a wide variety of unrefined, minimally-processed carbohydrates, like fruits, vegetables, whole grains, and legumes. Go, Caution, and Stop food choices are explained. Plant-based and lean animal proteins are emphasized. Rationale provided for low sodium intake for blood pressure control, low added sugars for blood sugar stabilization, and low added fats and oils for coronary artery disease risk reduction and weight management.  Calorie Density  Clinical staff conducted group or individual video education with verbal and written material and guidebook.  Patient learns about calorie density and how it impacts the Pritikin Eating Plan. Knowing the characteristics of the food you choose will help you decide whether those foods will lead to weight gain or weight loss, and whether you want to consume more or less of them. Weight loss is usually a side effect of the Pritikin Eating Plan because of its focus on low calorie-dense foods.  Label Reading  Clinical staff conducted group or individual video education with verbal and written material and guidebook.  Patient learns about the Pritikin recommended  label reading guidelines and corresponding recommendations regarding calorie density, added sugars, sodium content, and whole grains.  Dining Out - Part 1  Clinical staff conducted group or individual video education with verbal and written material and guidebook.  Patient learns that restaurant meals can be sabotaging because they can be so high in calories, fat, sodium, and/or sugar. Patient learns recommended strategies on how to positively address this and avoid unhealthy pitfalls.  Facts on Fats  Clinical staff conducted group or individual video education with verbal and written material and guidebook.  Patient learns that lifestyle modifications can be just as  effective, if not more so, as many medications for lowering your risk of heart disease. A Pritikin lifestyle can help to reduce your risk of inflammation and atherosclerosis (cholesterol build-up, or plaque, in the artery walls). Lifestyle interventions such as dietary choices and physical activity address the cause of atherosclerosis. A review of the types of fats and their impact on blood cholesterol levels, along with dietary recommendations to reduce fat intake is also included.  Nutrition Action Plan  Clinical staff conducted group or individual video education with verbal and written material and guidebook.  Patient learns how to incorporate Pritikin recommendations into their lifestyle. Recommendations include planning and keeping personal health goals in mind as an important part of their success.  Healthy Mind-Set    Healthy Minds, Bodies, Hearts  Clinical staff conducted group or individual video education with verbal and written material and guidebook.  Patient learns how to identify when they are stressed. Video will discuss the impact of that stress, as well as the many benefits of stress management. Patient will also be introduced to stress management techniques. The way we think, act, and feel has an impact on our hearts.  How Our Thoughts Can Heal Our Hearts  Clinical staff conducted group or individual video education with verbal and written material and guidebook.  Patient learns that negative thoughts can cause depression and anxiety. This can result in negative lifestyle behavior and serious health problems. Cognitive behavioral therapy is an effective method to help control our thoughts in order to change and improve our emotional outlook.  Additional Videos:  Exercise    Improving Performance  Clinical staff conducted group or individual video education with verbal and written material and guidebook.  Patient learns to use a non-linear approach by alternating intensity  levels and lengths of time spent exercising to help burn more calories and lose more body fat. Cardiovascular exercise helps improve heart health, metabolism, hormonal balance, blood sugar control, and recovery from fatigue. Resistance training improves strength, endurance, balance, coordination, reaction time, metabolism, and muscle mass. Flexibility exercise improves circulation, posture, and balance. Seek guidance from your physician and exercise physiologist before implementing an exercise routine and learn your capabilities and proper form for all exercise.  Introduction to Yoga  Clinical staff conducted group or individual video education with verbal and written material and guidebook.  Patient learns about yoga, a discipline of the coming together of mind, breath, and body. The benefits of yoga include improved flexibility, improved range of motion, better posture and core strength, increased lung function, weight loss, and positive self-image. Yoga's heart health benefits include lowered blood pressure, healthier heart rate, decreased cholesterol and triglyceride levels, improved immune function, and reduced stress. Seek guidance from your physician and exercise physiologist before implementing an exercise routine and learn your capabilities and proper form for all exercise.  Medical   Aging: Enhancing Your Quality of Life  Clinical  staff conducted group or individual video education with verbal and written material and guidebook.  Patient learns key strategies and recommendations to stay in good physical health and enhance quality of life, such as prevention strategies, having an advocate, securing a Health Care Proxy and Power of Attorney, and keeping a list of medications and system for tracking them. It also discusses how to avoid risk for bone loss.  Biology of Weight Control  Clinical staff conducted group or individual video education with verbal and written material and guidebook.   Patient learns that weight gain occurs because we consume more calories than we burn (eating more, moving less). Even if your body weight is normal, you may have higher ratios of fat compared to muscle mass. Too much body fat puts you at increased risk for cardiovascular disease, heart attack, stroke, type 2 diabetes, and obesity-related cancers. In addition to exercise, following the Pritikin Eating Plan can help reduce your risk.  Decoding Lab Results  Clinical staff conducted group or individual video education with verbal and written material and guidebook.  Patient learns that lab test reflects one measurement whose values change over time and are influenced by many factors, including medication, stress, sleep, exercise, food, hydration, pre-existing medical conditions, and more. It is recommended to use the knowledge from this video to become more involved with your lab results and evaluate your numbers to speak with your doctor.   Diseases of Our Time - Overview  Clinical staff conducted group or individual video education with verbal and written material and guidebook.  Patient learns that according to the CDC, 50% to 70% of chronic diseases (such as obesity, type 2 diabetes, elevated lipids, hypertension, and heart disease) are avoidable through lifestyle improvements including healthier food choices, listening to satiety cues, and increased physical activity.  Sleep Disorders Clinical staff conducted group or individual video education with verbal and written material and guidebook.  Patient learns how good quality and duration of sleep are important to overall health and well-being. Patient also learns about sleep disorders and how they impact health along with recommendations to address them, including discussing with a physician.  Nutrition  Dining Out - Part 2 Clinical staff conducted group or individual video education with verbal and written material and guidebook.  Patient learns  how to plan ahead and communicate in order to maximize their dining experience in a healthy and nutritious manner. Included are recommended food choices based on the type of restaurant the patient is visiting.   Fueling a Banker conducted group or individual video education with verbal and written material and guidebook.  There is a strong connection between our food choices and our health. Diseases like obesity and type 2 diabetes are very prevalent and are in large-part due to lifestyle choices. The Pritikin Eating Plan provides plenty of food and hunger-curbing satisfaction. It is easy to follow, affordable, and helps reduce health risks.  Menu Workshop  Clinical staff conducted group or individual video education with verbal and written material and guidebook.  Patient learns that restaurant meals can sabotage health goals because they are often packed with calories, fat, sodium, and sugar. Recommendations include strategies to plan ahead and to communicate with the manager, chef, or server to help order a healthier meal.  Planning Your Eating Strategy  Clinical staff conducted group or individual video education with verbal and written material and guidebook.  Patient learns about the Pritikin Eating Plan and its benefit of reducing the risk of  disease. The Pritikin Eating Plan does not focus on calories. Instead, it emphasizes high-quality, nutrient-rich foods. By knowing the characteristics of the foods, we choose, we can determine their calorie density and make informed decisions.  Targeting Your Nutrition Priorities  Clinical staff conducted group or individual video education with verbal and written material and guidebook.  Patient learns that lifestyle habits have a tremendous impact on disease risk and progression. This video provides eating and physical activity recommendations based on your personal health goals, such as reducing LDL cholesterol, losing weight,  preventing or controlling type 2 diabetes, and reducing high blood pressure.  Vitamins and Minerals  Clinical staff conducted group or individual video education with verbal and written material and guidebook.  Patient learns different ways to obtain key vitamins and minerals, including through a recommended healthy diet. It is important to discuss all supplements you take with your doctor.   Healthy Mind-Set    Smoking Cessation  Clinical staff conducted group or individual video education with verbal and written material and guidebook.  Patient learns that cigarette smoking and tobacco addiction pose a serious health risk which affects millions of people. Stopping smoking will significantly reduce the risk of heart disease, lung disease, and many forms of cancer. Recommended strategies for quitting are covered, including working with your doctor to develop a successful plan.  Culinary   Becoming a Set designer conducted group or individual video education with verbal and written material and guidebook.  Patient learns that cooking at home can be healthy, cost-effective, quick, and puts them in control. Keys to cooking healthy recipes will include looking at your recipe, assessing your equipment needs, planning ahead, making it simple, choosing cost-effective seasonal ingredients, and limiting the use of added fats, salts, and sugars.  Cooking - Breakfast and Snacks  Clinical staff conducted group or individual video education with verbal and written material and guidebook.  Patient learns how important breakfast is to satiety and nutrition through the entire day. Recommendations include key foods to eat during breakfast to help stabilize blood sugar levels and to prevent overeating at meals later in the day. Planning ahead is also a key component.  Cooking - Educational psychologist conducted group or individual video education with verbal and written material and  guidebook.  Patient learns eating strategies to improve overall health, including an approach to cook more at home. Recommendations include thinking of animal protein as a side on your plate rather than center stage and focusing instead on lower calorie dense options like vegetables, fruits, whole grains, and plant-based proteins, such as beans. Making sauces in large quantities to freeze for later and leaving the skin on your vegetables are also recommended to maximize your experience.  Cooking - Healthy Salads and Dressing Clinical staff conducted group or individual video education with verbal and written material and guidebook.  Patient learns that vegetables, fruits, whole grains, and legumes are the foundations of the Pritikin Eating Plan. Recommendations include how to incorporate each of these in flavorful and healthy salads, and how to create homemade salad dressings. Proper handling of ingredients is also covered. Cooking - Soups and [employer] - Soups and Desserts Clinical staff conducted group or individual video education with verbal and written material and guidebook.  Patient learns that Pritikin soups and desserts make for easy, nutritious, and delicious snacks and meal components that are low in sodium, fat, sugar, and calorie density, while high in vitamins, minerals, and filling fiber.  Recommendations include simple and healthy ideas for soups and desserts.   Overview     The Pritikin Solution Program Overview Clinical staff conducted group or individual video education with verbal and written material and guidebook.  Patient learns that the results of the Pritikin Program have been documented in more than 100 articles published in peer-reviewed journals, and the benefits include reducing risk factors for (and, in some cases, even reversing) high cholesterol, high blood pressure, type 2 diabetes, obesity, and more! An overview of the three key pillars of the Pritikin Program  will be covered: eating well, doing regular exercise, and having a healthy mind-set.  WORKSHOPS  Exercise: Exercise Basics: Building Your Action Plan Clinical staff led group instruction and group discussion with PowerPoint presentation and patient guidebook. To enhance the learning environment the use of posters, models and videos may be added. At the conclusion of this workshop, patients will comprehend the difference between physical activity and exercise, as well as the benefits of incorporating both, into their routine. Patients will understand the FITT (Frequency, Intensity, Time, and Type) principle and how to use it to build an exercise action plan. In addition, safety concerns and other considerations for exercise and cardiac rehab will be addressed by the presenter. The purpose of this lesson is to promote a comprehensive and effective weekly exercise routine in order to improve patients' overall level of fitness.   Managing Heart Disease: Your Path to a Healthier Heart Clinical staff led group instruction and group discussion with PowerPoint presentation and patient guidebook. To enhance the learning environment the use of posters, models and videos may be added.At the conclusion of this workshop, patients will understand the anatomy and physiology of the heart. Additionally, they will understand how Pritikin's three pillars impact the risk factors, the progression, and the management of heart disease.  The purpose of this lesson is to provide a high-level overview of the heart, heart disease, and how the Pritikin lifestyle positively impacts risk factors.  Exercise Biomechanics Clinical staff led group instruction and group discussion with PowerPoint presentation and patient guidebook. To enhance the learning environment the use of posters, models and videos may be added. Patients will learn how the structural parts of their bodies function and how these functions impact their daily  activities, movement, and exercise. Patients will learn how to promote a neutral spine, learn how to manage pain, and identify ways to improve their physical movement in order to promote healthy living. The purpose of this lesson is to expose patients to common physical limitations that impact physical activity. Participants will learn practical ways to adapt and manage aches and pains, and to minimize their effect on regular exercise. Patients will learn how to maintain good posture while sitting, walking, and lifting.  Balance Training and Fall Prevention  Clinical staff led group instruction and group discussion with PowerPoint presentation and patient guidebook. To enhance the learning environment the use of posters, models and videos may be added. At the conclusion of this workshop, patients will understand the importance of their sensorimotor skills (vision, proprioception, and the vestibular system) in maintaining their ability to balance as they age. Patients will apply a variety of balancing exercises that are appropriate for their current level of function. Patients will understand the common causes for poor balance, possible solutions to these problems, and ways to modify their physical environment in order to minimize their fall risk. The purpose of this lesson is to teach patients about the importance of maintaining balance as  they age and ways to minimize their risk of falling.  WORKSHOPS   Nutrition:  Fueling a Ship broker led group instruction and group discussion with PowerPoint presentation and patient guidebook. To enhance the learning environment the use of posters, models and videos may be added. Patients will review the foundational principles of the Pritikin Eating Plan and understand what constitutes a serving size in each of the food groups. Patients will also learn Pritikin-friendly foods that are better choices when away from home and review make-ahead  meal and snack options. Calorie density will be reviewed and applied to three nutrition priorities: weight maintenance, weight loss, and weight gain. The purpose of this lesson is to reinforce (in a group setting) the key concepts around what patients are recommended to eat and how to apply these guidelines when away from home by planning and selecting Pritikin-friendly options. Patients will understand how calorie density may be adjusted for different weight management goals.  Mindful Eating  Clinical staff led group instruction and group discussion with PowerPoint presentation and patient guidebook. To enhance the learning environment the use of posters, models and videos may be added. Patients will briefly review the concepts of the Pritikin Eating Plan and the importance of low-calorie dense foods. The concept of mindful eating will be introduced as well as the importance of paying attention to internal hunger signals. Triggers for non-hunger eating and techniques for dealing with triggers will be explored. The purpose of this lesson is to provide patients with the opportunity to review the basic principles of the Pritikin Eating Plan, discuss the value of eating mindfully and how to measure internal cues of hunger and fullness using the Hunger Scale. Patients will also discuss reasons for non-hunger eating and learn strategies to use for controlling emotional eating.  Targeting Your Nutrition Priorities Clinical staff led group instruction and group discussion with PowerPoint presentation and patient guidebook. To enhance the learning environment the use of posters, models and videos may be added. Patients will learn how to determine their genetic susceptibility to disease by reviewing their family history. Patients will gain insight into the importance of diet as part of an overall healthy lifestyle in mitigating the impact of genetics and other environmental insults. The purpose of this lesson is to  provide patients with the opportunity to assess their personal nutrition priorities by looking at their family history, their own health history and current risk factors. Patients will also be able to discuss ways of prioritizing and modifying the Pritikin Eating Plan for their highest risk areas  Menu  Clinical staff led group instruction and group discussion with PowerPoint presentation and patient guidebook. To enhance the learning environment the use of posters, models and videos may be added. Using menus brought in from [employer], or printed from [employer], patients will apply the Pritikin dining out guidelines that were presented in the [employer] video. Patients will also be able to practice these guidelines in a variety of provided scenarios. The purpose of this lesson is to provide patients with the opportunity to practice hands-on learning of the Pritikin Dining Out guidelines with actual menus and practice scenarios.  Label Reading Clinical staff led group instruction and group discussion with PowerPoint presentation and patient guidebook. To enhance the learning environment the use of posters, models and videos may be added. Patients will review and discuss the Pritikin label reading guidelines presented in Pritikin's Label Reading Educational series video. Using fool labels brought in from local  grocery stores and markets, patients will apply the label reading guidelines and determine if the packaged food meet the Pritikin guidelines. The purpose of this lesson is to provide patients with the opportunity to review, discuss, and practice hands-on learning of the Pritikin Label Reading guidelines with actual packaged food labels. Cooking School  Pritikin's [employer] are designed to teach patients ways to prepare quick, simple, and affordable recipes at home. The importance of nutrition's role in chronic disease risk reduction is reflected in its  emphasis in the overall Pritikin program. By learning how to prepare essential core Pritikin Eating Plan recipes, patients will increase control over what they eat; be able to customize the flavor of foods without the use of added salt, sugar, or fat; and improve the quality of the food they consume. By learning a set of core recipes which are easily assembled, quickly prepared, and affordable, patients are more likely to prepare more healthy foods at home. These workshops focus on convenient breakfasts, simple entres, side dishes, and desserts which can be prepared with minimal effort and are consistent with nutrition recommendations for cardiovascular risk reduction. Cooking [employer] are taught by a Armed forces logistics/support/administrative officer (RD) who has been trained by the [employer]. The chef or RD has a clear understanding of the importance of minimizing - if not completely eliminating - added fat, sugar, and sodium in recipes. Throughout the series of Cooking School Workshop sessions, patients will learn about healthy ingredients and efficient methods of cooking to build confidence in their capability to prepare    Cooking School weekly topics:  Adding Flavor- Sodium-Free  Fast and Healthy Breakfasts  Powerhouse Plant-Based Proteins  Satisfying Salads and Dressings  Simple Sides and Sauces  International Cuisine-Spotlight on the [employer] Zones  Delicious Desserts  Savory Soups  [employer] - Meals in a Astronomer Appetizers and Snacks  Comforting Weekend Breakfasts  One-Pot Wonders   Fast Evening Meals  Landscape architect Your Pritikin Plate  WORKSHOPS   Healthy Mindset (Psychosocial):  Focused Goals, Sustainable Changes Clinical staff led group instruction and group discussion with PowerPoint presentation and patient guidebook. To enhance the learning environment the use of posters, models and videos may be added. Patients will be able to apply effective  goal setting strategies to establish at least one personal goal, and then take consistent, meaningful action toward that goal. They will learn to identify common barriers to achieving personal goals and develop strategies to overcome them. Patients will also gain an understanding of how our mind-set can impact our ability to achieve goals and the importance of cultivating a positive and growth-oriented mind-set. The purpose of this lesson is to provide patients with a deeper understanding of how to set and achieve personal goals, as well as the tools and strategies needed to overcome common obstacles which may arise along the way.  From Head to Heart: The Power of a Healthy Outlook  Clinical staff led group instruction and group discussion with PowerPoint presentation and patient guidebook. To enhance the learning environment the use of posters, models and videos may be added. Patients will be able to recognize and describe the impact of emotions and mood on physical health. They will discover the importance of self-care and explore self-care practices which may work for them. Patients will also learn how to utilize the 4 C's to cultivate a healthier outlook and better manage stress and challenges. The purpose of this lesson is to demonstrate to  patients how a healthy outlook is an essential part of maintaining good health, especially as they continue their cardiac rehab journey.  Healthy Sleep for a Healthy Heart Clinical staff led group instruction and group discussion with PowerPoint presentation and patient guidebook. To enhance the learning environment the use of posters, models and videos may be added. At the conclusion of this workshop, patients will be able to demonstrate knowledge of the importance of sleep to overall health, well-being, and quality of life. They will understand the symptoms of, and treatments for, common sleep disorders. Patients will also be able to identify daytime and nighttime  behaviors which impact sleep, and they will be able to apply these tools to help manage sleep-related challenges. The purpose of this lesson is to provide patients with a general overview of sleep and outline the importance of quality sleep. Patients will learn about a few of the most common sleep disorders. Patients will also be introduced to the concept of "sleep hygiene," and discover ways to self-manage certain sleeping problems through simple daily behavior changes. Finally, the workshop will motivate patients by clarifying the links between quality sleep and their goals of heart-healthy living.   Recognizing and Reducing Stress Clinical staff led group instruction and group discussion with PowerPoint presentation and patient guidebook. To enhance the learning environment the use of posters, models and videos may be added. At the conclusion of this workshop, patients will be able to understand the types of stress reactions, differentiate between acute and chronic stress, and recognize the impact that chronic stress has on their health. They will also be able to apply different coping mechanisms, such as reframing negative self-talk. Patients will have the opportunity to practice a variety of stress management techniques, such as deep abdominal breathing, progressive muscle relaxation, and/or guided imagery.  The purpose of this lesson is to educate patients on the role of stress in their lives and to provide healthy techniques for coping with it.  Learning Barriers/Preferences:  Learning Barriers/Preferences - 09/02/22 0841       Learning Barriers/Preferences   Learning Barriers Sight   glasses   Learning Preferences Audio;Computer/Internet;Group Instruction;Individual Instruction;Skilled Demonstration;Pictoral;Verbal Instruction;Video;Written Material             Education Topics:  Knowledge Questionnaire Score:  Knowledge Questionnaire Score - 09/02/22 0840       Knowledge  Questionnaire Score   Pre Score 21/24             Core Components/Risk Factors/Patient Goals at Admission:  Personal Goals and Risk Factors at Admission - 09/02/22 0841       Core Components/Risk Factors/Patient Goals on Admission    Weight Management Yes;Obesity;Weight Loss    Intervention Weight Management: Provide education and appropriate resources to help participant work on and attain dietary goals.;Weight Management: Develop a combined nutrition and exercise program designed to reach desired caloric intake, while maintaining appropriate intake of nutrient and fiber, sodium and fats, and appropriate energy expenditure required for the weight goal.;Weight Management/Obesity: Establish reasonable short term and long term weight goals.;Obesity: Provide education and appropriate resources to help participant work on and attain dietary goals.    Admit Weight 200 lb 9.9 oz (91 kg)    Expected Outcomes Short Term: Continue to assess and modify interventions until short term weight is achieved;Long Term: Adherence to nutrition and physical activity/exercise program aimed toward attainment of established weight goal;Weight Loss: Understanding of general recommendations for a balanced deficit meal plan, which promotes 1-2 lb weight loss per  week and includes a negative energy balance of [phone] kcal/d;Understanding recommendations for meals to include 15-35% energy as protein, 25-35% energy from fat, 35-60% energy from carbohydrates, less than 200mg  of dietary cholesterol, 20-35 gm of total fiber daily;Understanding of distribution of calorie intake throughout the day with the consumption of 4-5 meals/snacks    Diabetes Yes    Intervention Provide education about signs/symptoms and action to take for hypo/hyperglycemia.;Provide education about proper nutrition, including hydration, and aerobic/resistive exercise prescription along with prescribed medications to achieve blood glucose in normal ranges:  Fasting glucose 65-99 mg/dL    Expected Outcomes Short Term: Participant verbalizes understanding of the signs/symptoms and immediate care of hyper/hypoglycemia, proper foot care and importance of medication, aerobic/resistive exercise and nutrition plan for blood glucose control.;Long Term: Attainment of HbA1C < 7%.    Hypertension Yes    Intervention Provide education on lifestyle modifcations including regular physical activity/exercise, weight management, moderate sodium restriction and increased consumption of fresh fruit, vegetables, and low fat dairy, alcohol moderation, and smoking cessation.;Monitor prescription use compliance.    Expected Outcomes Short Term: Continued assessment and intervention until BP is < 140/37mm HG in hypertensive participants. < 130/99mm HG in hypertensive participants with diabetes, heart failure or chronic kidney disease.;Long Term: Maintenance of blood pressure at goal levels.    Lipids Yes    Intervention Provide education and support for participant on nutrition & aerobic/resistive exercise along with prescribed medications to achieve LDL 70mg , HDL >40mg .    Expected Outcomes Short Term: Participant states understanding of desired cholesterol values and is compliant with medications prescribed. Participant is following exercise prescription and nutrition guidelines.;Long Term: Cholesterol controlled with medications as prescribed, with individualized exercise RX and with personalized nutrition plan. Value goals: LDL < 70mg , HDL > 40 mg.    Stress Yes    Intervention Offer individual and/or small group education and counseling on adjustment to heart disease, stress management and health-related lifestyle change. Teach and support self-help strategies.;Refer participants experiencing significant psychosocial distress to appropriate mental health specialists for further evaluation and treatment. When possible, include family members and significant others in  education/counseling sessions.    Expected Outcomes Short Term: Participant demonstrates changes in health-related behavior, relaxation and other stress management skills, ability to obtain effective social support, and compliance with psychotropic medications if prescribed.;Long Term: Emotional wellbeing is indicated by absence of clinically significant psychosocial distress or social isolation.             Core Components/Risk Factors/Patient Goals Review:   Goals and Risk Factor Review     Row Name 09/20/22 1004 10/22/22 0814 11/14/22 1038         Core Components/Risk Factors/Patient Goals Review   Personal Goals Review Weight Management/Obesity;Diabetes;Hypertension;Lipids;Stress Weight Management/Obesity;Diabetes;Hypertension;Lipids;Stress Weight Management/Obesity;Diabetes;Hypertension;Lipids;Stress     Review Toluwanimi is off to a good start to exercise at cardiac rehab. Vital signs and CBG's have been stable Samie is off to a good start to exercise at cardiac rehab. Vital signs and CBG's have been stable. Shomari has lost 4.4 kg since starting cardiac rehab. Kaleea is doing well with exercise at cardiac rehab. Vital signs and CBG's have been stable. Tarnesha has lost 1.0 kg since starting cardiac rehab.     Expected Outcomes Lareta will continue to participate in cardiac rehab for exercise, nutrition and lifestyle modifications. Boots will continue to participate in cardiac rehab for exercise, nutrition and lifestyle modifications. Dejah will continue to participate in cardiac rehab for exercise, nutrition and lifestyle modifications.  Core Components/Risk Factors/Patient Goals at Discharge (Final Review):   Goals and Risk Factor Review - 11/14/22 1038       Core Components/Risk Factors/Patient Goals Review   Personal Goals Review Weight Management/Obesity;Diabetes;Hypertension;Lipids;Stress    Review Freddye is doing well with exercise at cardiac rehab. Vital signs  and CBG's have been stable. Shyniece has lost 1.0 kg since starting cardiac rehab.    Expected Outcomes Kahmari will continue to participate in cardiac rehab for exercise, nutrition and lifestyle modifications.             ITP Comments:  ITP Comments     Row Name 09/02/22 [phone] 09/20/22 0955 10/22/22 0809 11/14/22 1034     ITP Comments Dr. Armanda Magic medical director. Introduction to pritikin education/ intensive cardiac rehab. Initial orientation packet reviewed with patient. 30 Day ITP Review. Gregory started cardiac rehab on 09/09/22 and is off to a good start to exercise. 30 Day ITP Review. Christina has good attendance and participation in cardiac rehab. 30 Day ITP Review. Maryori continues to have good  attendance and participation in cardiac rehab. Johnsie will complete cardiac rehab at the end of October.             Comments: See ITP comments.Thayer Headings RN BSN

## 2022-11-15 ENCOUNTER — Encounter (HOSPITAL_COMMUNITY): Payer: Self-pay | Admitting: Gastroenterology

## 2022-11-15 ENCOUNTER — Encounter (HOSPITAL_COMMUNITY)
Admission: RE | Admit: 2022-11-15 | Discharge: 2022-11-15 | Disposition: A | Discharge status: Home / Self Care | Payer: 59 | Source: Ambulatory Visit | Attending: Cardiology | Admitting: Cardiology

## 2022-11-15 DIAGNOSIS — Z951 Presence of aortocoronary bypass graft: Secondary | ICD-10-CM

## 2022-11-15 DIAGNOSIS — Z48812 Encounter for surgical aftercare following surgery on the circulatory system: Secondary | ICD-10-CM | POA: Diagnosis not present

## 2022-11-15 NOTE — Progress Notes (Signed)
Attempted to obtain medical history for pre op call via telephone, unable to reach at this time. HIPAA compliant voicemail message left requesting return call to pre surgical testing department.

## 2022-11-18 ENCOUNTER — Encounter (HOSPITAL_COMMUNITY)
Admission: RE | Admit: 2022-11-18 | Discharge: 2022-11-18 | Disposition: A | Discharge status: Home / Self Care | Payer: 59 | Source: Ambulatory Visit | Attending: Cardiology

## 2022-11-18 DIAGNOSIS — Z48812 Encounter for surgical aftercare following surgery on the circulatory system: Secondary | ICD-10-CM | POA: Diagnosis not present

## 2022-11-18 DIAGNOSIS — Z951 Presence of aortocoronary bypass graft: Secondary | ICD-10-CM

## 2022-11-20 ENCOUNTER — Encounter (HOSPITAL_COMMUNITY)
Admission: RE | Admit: 2022-11-20 | Discharge: 2022-11-20 | Disposition: A | Discharge status: Home / Self Care | Payer: 59 | Source: Ambulatory Visit | Attending: Cardiology

## 2022-11-20 DIAGNOSIS — Z48812 Encounter for surgical aftercare following surgery on the circulatory system: Secondary | ICD-10-CM | POA: Diagnosis not present

## 2022-11-20 DIAGNOSIS — Z951 Presence of aortocoronary bypass graft: Secondary | ICD-10-CM

## 2022-11-20 NOTE — Progress Notes (Signed)
Pre op call eval Name:Kayla Wayland [name] MD Cardiologist-Ganji MD  EKG-09/27/22 Echo-n/a Cath-04/05/22 Stress-n/a ICD/PM-n/a Blood thinner-Plavix hold 5 days GLP-1- Rybelsus daily, 24h hold  UJ:[id number] heart rate, renal disorder, pcos, CABGx2. Last saw cardiology 09/20/22 was seeing for ortho hypotension. In cardiac rehab currently, feeling good, states blood pressures have been good. Did have a fall 10/7 but pt felt was just being clumsy, tripped while running.  Anesthesia Review: Yes

## 2022-11-20 NOTE — Anesthesia Preprocedure Evaluation (Addendum)
Anesthesia Evaluation  Patient identified by MRN, date of birth, ID band Patient awake    Reviewed: Allergy & Precautions, NPO status , Patient's Chart, lab work & pertinent test results, reviewed documented beta blocker date and time   History of Anesthesia Complications (+) PONV  Airway Mallampati: II  TM Distance: >3 FB Neck ROM: Full    Dental  (+) Dental Advisory Given   Pulmonary neg pulmonary ROS   breath sounds clear to auscultation       Cardiovascular hypertension, Pt. on medications and Pt. on home beta blockers (-) angina + CAD and + CABG (06/2022 CABG x2)   Rhythm:Regular Rate:Normal  '23 ECHO: EF 61%, normal LVF, no significant valvular abnormalities   Neuro/Psych  Headaches    GI/Hepatic Neg liver ROS,GERD  Medicated and Controlled,,  Endo/Other  diabetes (glu 158), Insulin DependentHypothyroidism  BMI 34  Renal/GU Renal InsufficiencyRenal disease     Musculoskeletal   Abdominal   Peds  Hematology negative hematology ROS (+)   Anesthesia Other Findings   Reproductive/Obstetrics                             Anesthesia Physical Anesthesia Plan  ASA: 3  Anesthesia Plan: General   Post-op Pain Management: Minimal or no pain anticipated   Induction:   PONV Risk Score and Plan: 4 or greater and Ondansetron and Treatment may vary due to age or medical condition  Airway Management Planned: Natural Airway and Simple Face Mask  Additional Equipment: None  Intra-op Plan:   Post-operative Plan:   Informed Consent: I have reviewed the patients History and Physical, chart, labs and discussed the procedure including the risks, benefits and alternatives for the proposed anesthesia with the patient or authorized representative who has indicated his/her understanding and acceptance.     Dental advisory given  Plan Discussed with: CRNA and Surgeon  Anesthesia Plan Comments:  (Reviewed. Ok to proceed per Dr. Hyacinth Meeker)        Anesthesia Quick Evaluation

## 2022-11-21 NOTE — Plan of Care (Signed)
[facility] Tonsillectomy/Adenoidectomy, Postoperative PEDS care plan entered in error.

## 2022-11-22 ENCOUNTER — Ambulatory Visit (HOSPITAL_COMMUNITY)
Admission: RE | Admit: 2022-11-22 | Discharge: 2022-11-22 | Disposition: A | Discharge status: Home / Self Care | Payer: 59 | Attending: Gastroenterology | Admitting: Gastroenterology

## 2022-11-22 ENCOUNTER — Encounter (HOSPITAL_COMMUNITY): Payer: 59

## 2022-11-22 ENCOUNTER — Encounter (HOSPITAL_COMMUNITY)
Admission: RE | Disposition: A | Discharge status: Home / Self Care | Payer: Self-pay | Source: Home / Self Care | Attending: Gastroenterology

## 2022-11-22 ENCOUNTER — Ambulatory Visit (HOSPITAL_COMMUNITY): Payer: 59 | Admitting: Medical

## 2022-11-22 ENCOUNTER — Other Ambulatory Visit: Payer: Self-pay

## 2022-11-22 DIAGNOSIS — D122 Benign neoplasm of ascending colon: Secondary | ICD-10-CM | POA: Diagnosis not present

## 2022-11-22 DIAGNOSIS — Z1211 Encounter for screening for malignant neoplasm of colon: Secondary | ICD-10-CM

## 2022-11-22 DIAGNOSIS — Z8249 Family history of ischemic heart disease and other diseases of the circulatory system: Secondary | ICD-10-CM | POA: Insufficient documentation

## 2022-11-22 DIAGNOSIS — E119 Type 2 diabetes mellitus without complications: Secondary | ICD-10-CM | POA: Insufficient documentation

## 2022-11-22 DIAGNOSIS — Z794 Long term (current) use of insulin: Secondary | ICD-10-CM | POA: Diagnosis not present

## 2022-11-22 DIAGNOSIS — K219 Gastro-esophageal reflux disease without esophagitis: Secondary | ICD-10-CM | POA: Diagnosis not present

## 2022-11-22 DIAGNOSIS — I10 Essential (primary) hypertension: Secondary | ICD-10-CM | POA: Insufficient documentation

## 2022-11-22 DIAGNOSIS — D126 Benign neoplasm of colon, unspecified: Secondary | ICD-10-CM

## 2022-11-22 DIAGNOSIS — Z9049 Acquired absence of other specified parts of digestive tract: Secondary | ICD-10-CM | POA: Insufficient documentation

## 2022-11-22 DIAGNOSIS — I2510 Atherosclerotic heart disease of native coronary artery without angina pectoris: Secondary | ICD-10-CM | POA: Insufficient documentation

## 2022-11-22 DIAGNOSIS — K5730 Diverticulosis of large intestine without perforation or abscess without bleeding: Secondary | ICD-10-CM | POA: Insufficient documentation

## 2022-11-22 DIAGNOSIS — Z833 Family history of diabetes mellitus: Secondary | ICD-10-CM | POA: Diagnosis not present

## 2022-11-22 DIAGNOSIS — Z951 Presence of aortocoronary bypass graft: Secondary | ICD-10-CM | POA: Diagnosis not present

## 2022-11-22 HISTORY — PX: SUBMUCOSAL TATTOO INJECTION: SHX6856

## 2022-11-22 HISTORY — PX: POLYPECTOMY: SHX5525

## 2022-11-22 HISTORY — PX: COLONOSCOPY WITH PROPOFOL: SHX5780

## 2022-11-22 LAB — GLUCOSE, CAPILLARY: Glucose-Capillary: 118 mg/dL — ABNORMAL HIGH (ref 70–99)

## 2022-11-22 SURGERY — COLONOSCOPY WITH PROPOFOL
Anesthesia: General

## 2022-11-22 MED ORDER — LIDOCAINE 2% (20 MG/ML) 5 ML SYRINGE
INTRAMUSCULAR | Status: DC | PRN
  Administered 2022-11-22: 40 mg via INTRAVENOUS

## 2022-11-22 MED ORDER — PROPOFOL 500 MG/50ML IV EMUL
INTRAVENOUS | Status: DC | PRN
  Administered 2022-11-22: 90 ug/kg/min via INTRAVENOUS

## 2022-11-22 MED ORDER — SODIUM CHLORIDE 0.9 % IV SOLN: INTRAVENOUS | Status: DC

## 2022-11-22 MED ORDER — SPOT INK MARKER SYRINGE KIT
PACK | SUBMUCOSAL | Status: DC | PRN
  Administered 2022-11-22: 2 mL via SUBMUCOSAL

## 2022-11-22 MED ORDER — PROPOFOL 10 MG/ML IV BOLUS
INTRAVENOUS | Status: DC | PRN
  Administered 2022-11-22: 10 mg via INTRAVENOUS
  Administered 2022-11-22: 20 mg via INTRAVENOUS
  Administered 2022-11-22: 10 mg via INTRAVENOUS
  Administered 2022-11-22: 50 mg via INTRAVENOUS

## 2022-11-22 SURGICAL SUPPLY — 22 items

## 2022-11-22 NOTE — Op Note (Signed)
[facility] Patient Name: Kayla Berger Procedure Date: 11/22/2022 MRN: [id number] Attending MD: Jeani Hawking , MD, [id number] Date of Birth: August 06, 1960 CSN: [id number] Age: 62 Admit Type: Outpatient Procedure:                Colonoscopy Indications:              Screening for colorectal malignant neoplasm Providers:                Jeani Hawking, MD Referring MD:              Medicines:                Propofol per Anesthesia Complications:            No immediate complications. Estimated Blood Loss:     Estimated blood loss: none. Estimated blood loss:                            none. Procedure:                Pre-Anesthesia Assessment:                           - Prior to the procedure, a History and Physical                            was performed, and patient medications and                            allergies were reviewed. The patient's tolerance of                            previous anesthesia was also reviewed. The risks                            and benefits of the procedure and the sedation                            options and risks were discussed with the patient.                            All questions were answered, and informed consent                            was obtained. Prior Anticoagulants: The patient has                            taken no anticoagulant or antiplatelet agents. ASA                            Grade Assessment: III - A patient with severe                            systemic disease. After reviewing the risks and                            benefits,  the patient was deemed in satisfactory                            condition to undergo the procedure.                           - Sedation was administered by an anesthesia                            professional. Deep sedation was attained.                           After obtaining informed consent, the colonoscope                            was passed under direct vision.  Throughout the                            procedure, the patient's blood pressure, pulse, and                            oxygen saturations were monitored continuously. The                            CF-HQ190L ([id number] Olympus colonoscope was                            introduced through the anus and advanced to the the                            cecum, identified by appendiceal orifice and                            ileocecal valve. The colonoscopy was extremely                            difficult due to a tortuous colon. The patient                            tolerated the procedure well. The quality of the                            bowel preparation was evaluated using the BBPS                            [facility] Bowel Preparation Scale) with scores of:                            Right Colon = 3 (entire mucosa seen well with no                            residual staining, small fragments of stool or  opaque liquid), Transverse Colon = 3 (entire mucosa                            seen well with no residual staining, small                            fragments of stool or opaque liquid) and Left Colon                            = 3 (entire mucosa seen well with no residual                            staining, small fragments of stool or opaque                            liquid). The total BBPS score equals 9. The quality                            of the bowel preparation was good. The ileocecal                            valve, appendiceal orifice, and rectum were                            photographed. Scope In: 8:52:27 AM Scope Out: 9:43:14 AM Scope Withdrawal Time: 0 hours 42 minutes 31 seconds  Total Procedure Duration: 0 hours 50 minutes 47 seconds  Findings:      Four sessile polyps were found in the ascending colon. The polyps were 3       to 10 mm in size. These polyps were removed with a cold snare. Resection       and retrieval were complete. Area  was tattooed with an injection of 2 mL       of Spot (carbon black).      Scattered large-mouthed, medium-mouthed and small-mouthed diverticula       were found in the sigmoid colon.      Several polyps were noted in the ascending colon and near the hepatic       flexure. Positioning was EXTREMELY difficult as the diverticular disease       prevented a stable or favorable positioning. In addition, the patient as       moderate hypercontractility and the compounded to the loss of       positioning. A supine position helped at one point and then she was       returned back to the left lateral decubitus position. With passage of       time and waiting for spontaneous improvements in positioning the polyps       were removed. A tattoo was placed on the opposite wall near the hepatic       flexure as it was felt that the polyp on the opposite wall was not able       to be visualized and removed. However, after injection of the opposite       wall, the polyp became visible and it was able to be captured. It is       believed that  the entire polyp was removed. Impression:               - Four 3 to 10 mm polyps in the ascending colon,                            removed with a cold snare. Resected and retrieved.                           - Diverticulosis in the sigmoid colon. Moderate Sedation:      Not Applicable - Patient had care per Anesthesia. Recommendation:           - Patient has a contact number available for                            emergencies. The signs and symptoms of potential                            delayed complications were discussed with the                            patient. Return to normal activities tomorrow.                            Written discharge instructions were provided to the                            patient.                           - Resume previous diet.                           - Continue present medications.                           - Await  pathology results.                           - Repeat colonoscopy in 3 years for surveillance. Procedure Code(s):        --- Professional ---                           [phone], Colonoscopy, flexible; with removal of                            tumor(s), polyp(s), or other lesion(s) by snare                            technique                           45381, Colonoscopy, flexible; with directed                            submucosal injection(s), any substance Diagnosis Code(s):        --- Professional ---  Z12.11, Encounter for screening for malignant                            neoplasm of colon                           D12.2, Benign neoplasm of ascending colon                           K57.30, Diverticulosis of large intestine without                            perforation or abscess without bleeding CPT copyright 2022 American [employer] Association. All rights reserved. The codes documented in this report are preliminary and upon coder review may  be revised to meet current compliance requirements. Jeani Hawking, MD Jeani Hawking, MD 11/22/2022 9:54:17 AM This report has been signed electronically. Number of Addenda: 0

## 2022-11-22 NOTE — Transfer of Care (Signed)
Immediate Anesthesia Transfer of Care Note  Patient: Kayla Berger  Procedure(s) Performed: COLONOSCOPY WITH PROPOFOL POLYPECTOMY SUBMUCOSAL TATTOO INJECTION  Patient Location: PACU  Anesthesia Type:MAC  Level of Consciousness: awake, alert , oriented, and patient cooperative  Airway & Oxygen Therapy: Patient Spontanous Breathing and Patient connected to face mask oxygen  Post-op Assessment: Report given to RN and Post -op Vital signs reviewed and stable  Post vital signs: Reviewed and stable  Last Vitals:  Vitals Value Taken Time  BP 114/62 11/22/22 0950  Temp 35.8 C 11/22/22 0947  Pulse 68 11/22/22 0950  Resp 0 11/22/22 0950  SpO2 100 % 11/22/22 0950  Vitals shown include unfiled device data.  Last Pain:  Vitals:   11/22/22 0947  TempSrc: Temporal  PainSc:          Complications: No notable events documented.

## 2022-11-22 NOTE — Anesthesia Postprocedure Evaluation (Signed)
Anesthesia Post Note  Patient: Kayla Berger  Procedure(s) Performed: COLONOSCOPY WITH PROPOFOL POLYPECTOMY SUBMUCOSAL TATTOO INJECTION     Patient location during evaluation: Phase II Anesthesia Type: General Level of consciousness: awake and alert, patient cooperative and oriented Pain management: pain level controlled Vital Signs Assessment: post-procedure vital signs reviewed and stable Respiratory status: spontaneous breathing, nonlabored ventilation and respiratory function stable Cardiovascular status: blood pressure returned to baseline and stable Postop Assessment: no apparent nausea or vomiting Anesthetic complications: no   No notable events documented.  Last Vitals:  Vitals:   11/22/22 0953 11/22/22 1000  BP:  132/64  Pulse: 69 71  Resp: 19 (!) 21  Temp:    SpO2: 99% 96%    Last Pain:  Vitals:   11/22/22 1000  TempSrc:   PainSc: 0-No pain                 Leng Montesdeoca,E. Jenicka Coxe

## 2022-11-22 NOTE — H&P (Signed)
Kayla Berger HPI: At this time the patient denies any problems with nausea, vomiting, fevers, chills, abdominal pain, diarrhea, hematochezia, melena, or dysphagia. The patient denies any known family history of colon cancers. No complaints of MI or sleep apnea.  She had a normal screening colonoscopy at [facility] 11.5 years ago.  She complains about constipation and she uses fiber gummies with reasonable effect.  On a rare occassion she uses Ducolax.  Her GERD is controlled with pantoprazole.  Recently she was diagnosed with CAD, 85% LAD and 99% in a smaller vessel.  She is being medically managed by Dr. Jacinto Halim.  The patient was not able to stented as a result of her coronary artery calcifications.   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/2022   Shoulder pain    Thyroid disease    Trochanteric bursitis of left hip    Vaginal cysts    4 have been removed    Past Surgical History:  Procedure Laterality Date   APPENDECTOMY  1967   ARTERIAL BYPASS SURGRY  07/18/2022   CARDIAC CATHETERIZATION Bilateral    cataracts   CHOLECYSTECTOMY N/A 02/16/2021   Procedure: LAPAROSCOPIC CHOLECYSTECTOMY;  Surgeon: [name], MD;  Location: [facility] OR;  Service: General;  Laterality: N/A;   CORONARY PRESSURE/FFR STUDY N/A 04/05/2022   Procedure: INTRAVASCULAR PRESSURE WIRE/FFR STUDY;  Surgeon: Yates Decamp, MD;  Location: [facility] INVASIVE CV LAB;  Service: Cardiovascular;  Laterality: N/A;   KYPHOPLASTY N/A  10/31/2016   Procedure: KYPHOPLASTY LUMBAR ONE, LUMBAR TWO AND  LUMBAR THREE;  Surgeon: Venita Lick, MD;  Location: [facility] OR;  Service: Orthopedics;  Laterality: N/A;   LEFT HEART CATH AND CORONARY ANGIOGRAPHY N/A 04/05/2022   Procedure: LEFT HEART CATH AND CORONARY ANGIOGRAPHY;  Surgeon: Yates Decamp, MD;  Location: [facility] INVASIVE CV LAB;  Service: Cardiovascular;  Laterality: N/A;   nsvd  2001   normal vaginal del   sonobysterogram, laporoscopy  1997   fertility test    Family History  Problem Relation Age of Onset   Hypertension Mother    Diabetes Mother    Stroke Mother 81       first one, (pt had 3 total)   Hypertension Father    Cancer Father    Hypertension Sister    Heart disease Sister    Hypertension Maternal Grandmother    Heart attack Maternal Grandfather    Diabetes Maternal Grandfather    Diabetes Paternal Grandfather     Social History:  reports that she has never smoked. She has never used smokeless tobacco. She reports that she does not drink alcohol and does not use drugs.  Allergies:  Allergies  Allergen Reactions   Hydrocodone Itching, Swelling and Other (See Comments)    Numbness of the face/hands and tingling also    Medications: Scheduled: Continuous:  sodium chloride  No results found for this or any previous visit (from the past 24 hour(s)).   No results found.  ROS:  As stated above in the HPI otherwise negative.  Weight 87.5 kg, last menstrual period 10/06/2012.    PE: Gen: NAD, Alert and Oriented HEENT:  [place]/AT, EOMI Neck: Supple, no LAD Lungs: CTA Bilaterally CV: RRR without M/G/R ABD: Soft, NTND, +BS Ext: No C/C/E  Assessment/Plan: 1) Screening colonoscopy.  Laresa Oshiro D 11/22/2022, 8:12 AM

## 2022-11-22 NOTE — Discharge Instructions (Signed)

## 2022-11-23 ENCOUNTER — Other Ambulatory Visit: Payer: Self-pay

## 2022-11-23 ENCOUNTER — Emergency Department (HOSPITAL_COMMUNITY): Payer: 59

## 2022-11-23 ENCOUNTER — Telehealth: Payer: Self-pay | Admitting: Physician Assistant

## 2022-11-23 ENCOUNTER — Encounter (HOSPITAL_COMMUNITY): Payer: Self-pay

## 2022-11-23 ENCOUNTER — Emergency Department (HOSPITAL_COMMUNITY)
Admission: EM | Admit: 2022-11-23 | Discharge: 2022-11-23 | Disposition: A | Discharge status: Home / Self Care | Payer: 59 | Attending: Emergency Medicine | Admitting: Emergency Medicine

## 2022-11-23 DIAGNOSIS — I2510 Atherosclerotic heart disease of native coronary artery without angina pectoris: Secondary | ICD-10-CM | POA: Diagnosis not present

## 2022-11-23 DIAGNOSIS — Z951 Presence of aortocoronary bypass graft: Secondary | ICD-10-CM | POA: Diagnosis not present

## 2022-11-23 DIAGNOSIS — R109 Unspecified abdominal pain: Secondary | ICD-10-CM

## 2022-11-23 DIAGNOSIS — E119 Type 2 diabetes mellitus without complications: Secondary | ICD-10-CM | POA: Diagnosis not present

## 2022-11-23 DIAGNOSIS — R1031 Right lower quadrant pain: Secondary | ICD-10-CM | POA: Insufficient documentation

## 2022-11-23 DIAGNOSIS — Z7984 Long term (current) use of oral hypoglycemic drugs: Secondary | ICD-10-CM | POA: Insufficient documentation

## 2022-11-23 DIAGNOSIS — I10 Essential (primary) hypertension: Secondary | ICD-10-CM | POA: Insufficient documentation

## 2022-11-23 DIAGNOSIS — E039 Hypothyroidism, unspecified: Secondary | ICD-10-CM | POA: Insufficient documentation

## 2022-11-23 DIAGNOSIS — R1011 Right upper quadrant pain: Secondary | ICD-10-CM | POA: Insufficient documentation

## 2022-11-23 DIAGNOSIS — Z7982 Long term (current) use of aspirin: Secondary | ICD-10-CM | POA: Insufficient documentation

## 2022-11-23 DIAGNOSIS — Z79899 Other long term (current) drug therapy: Secondary | ICD-10-CM | POA: Insufficient documentation

## 2022-11-23 DIAGNOSIS — Z794 Long term (current) use of insulin: Secondary | ICD-10-CM | POA: Diagnosis not present

## 2022-11-23 DIAGNOSIS — Z7902 Long term (current) use of antithrombotics/antiplatelets: Secondary | ICD-10-CM | POA: Insufficient documentation

## 2022-11-23 LAB — CBC WITH DIFFERENTIAL/PLATELET
Abs Immature Granulocytes: 0.02 10*3/uL (ref 0.00–0.07)
Basophils Absolute: 0 10*3/uL (ref 0.0–0.1)
Basophils Relative: 1 %
Eosinophils Absolute: 0.1 10*3/uL (ref 0.0–0.5)
Eosinophils Relative: 1 %
HCT: 41 % (ref 36.0–46.0)
Hemoglobin: 13.3 g/dL (ref 12.0–15.0)
Immature Granulocytes: 0 %
Lymphocytes Relative: 27 %
Lymphs Abs: 2 10*3/uL (ref 0.7–4.0)
MCH: 28 pg (ref 26.0–34.0)
MCHC: 32.4 g/dL (ref 30.0–36.0)
MCV: 86.3 fL (ref 80.0–100.0)
Monocytes Absolute: 0.6 10*3/uL (ref 0.1–1.0)
Monocytes Relative: 7 %
Neutro Abs: 4.9 10*3/uL (ref 1.7–7.7)
Neutrophils Relative %: 64 %
Platelets: 193 10*3/uL (ref 150–400)
RBC: 4.75 MIL/uL (ref 3.87–5.11)
RDW: 16 % — ABNORMAL HIGH (ref 11.5–15.5)
WBC: 7.6 10*3/uL (ref 4.0–10.5)
nRBC: 0 % (ref 0.0–0.2)

## 2022-11-23 LAB — URINALYSIS, W/ REFLEX TO CULTURE (INFECTION SUSPECTED)
Bilirubin Urine: NEGATIVE
Glucose, UA: NEGATIVE mg/dL
Hgb urine dipstick: NEGATIVE
Ketones, ur: 20 mg/dL — AB
Nitrite: NEGATIVE
Protein, ur: 100 mg/dL — AB
Specific Gravity, Urine: >1.046 — ABNORMAL HIGH (ref 1.005–1.030)
pH: 8 (ref 5.0–8.0)

## 2022-11-23 LAB — COMPREHENSIVE METABOLIC PANEL
ALT: 33 U/L (ref 0–44)
AST: 30 U/L (ref 15–41)
Albumin: 3.8 g/dL (ref 3.5–5.0)
Alkaline Phosphatase: 87 U/L (ref 38–126)
Anion gap: 9 (ref 5–15)
BUN: 8 mg/dL (ref 8–23)
CO2: 26 mmol/L (ref 22–32)
Calcium: 8 mg/dL — ABNORMAL LOW (ref 8.9–10.3)
Chloride: 107 mmol/L (ref 98–111)
Creatinine, Ser: 0.97 mg/dL (ref 0.44–1.00)
GFR, Estimated: >60 mL/min (ref >60)
Glucose, Bld: 129 mg/dL — ABNORMAL HIGH (ref 70–99)
Potassium: 3.3 mmol/L — ABNORMAL LOW (ref 3.5–5.1)
Sodium: 142 mmol/L (ref 135–145)
Total Bilirubin: 0.7 mg/dL (ref 0.3–1.2)
Total Protein: 6.7 g/dL (ref 6.5–8.1)

## 2022-11-23 LAB — LIPASE, BLOOD: Lipase: 34 U/L (ref 11–51)

## 2022-11-23 MED ORDER — ONDANSETRON 4 MG PO TBDP: 4.0000 mg | ORAL_TABLET | Freq: Three times a day (TID) | ORAL | 0 refills | Status: AC | PRN

## 2022-11-23 MED ORDER — ONDANSETRON HCL 4 MG/2ML IJ SOLN
4.0000 mg | Freq: Once | INTRAMUSCULAR | Status: AC
  Administered 2022-11-23: 4 mg via INTRAVENOUS
  Filled 2022-11-23: qty 2

## 2022-11-23 MED ORDER — ONDANSETRON HCL 4 MG/2ML IJ SOLN
4.0000 mg | Freq: Once | INTRAMUSCULAR | Status: AC
Start: 2022-11-23 — End: 2022-11-23
  Administered 2022-11-23: 4 mg via INTRAVENOUS
  Filled 2022-11-23: qty 2

## 2022-11-23 MED ORDER — SODIUM CHLORIDE 0.9 % IV BOLUS
1000.0000 mL | Freq: Once | INTRAVENOUS | Status: AC
  Administered 2022-11-23: 1000 mL via INTRAVENOUS

## 2022-11-23 MED ORDER — HYDROMORPHONE HCL 1 MG/ML IJ SOLN
0.5000 mg | Freq: Once | INTRAMUSCULAR | Status: AC
  Administered 2022-11-23: 0.5 mg via INTRAVENOUS
  Filled 2022-11-23: qty 1

## 2022-11-23 MED ORDER — POTASSIUM CHLORIDE CRYS ER 20 MEQ PO TBCR
40.0000 meq | EXTENDED_RELEASE_TABLET | Freq: Once | ORAL | Status: AC
  Administered 2022-11-23: 40 meq via ORAL
  Filled 2022-11-23: qty 2

## 2022-11-23 MED ORDER — OXYCODONE HCL 5 MG PO TABS: 5.0000 mg | ORAL_TABLET | Freq: Four times a day (QID) | ORAL | 0 refills | Status: DC | PRN

## 2022-11-23 MED ORDER — IOHEXOL 350 MG/ML SOLN
75.0000 mL | Freq: Once | INTRAVENOUS | Status: AC | PRN
  Administered 2022-11-23: 75 mL via INTRAVENOUS

## 2022-11-23 NOTE — ED Notes (Signed)
Pt reports she is unable to urinate. Reports she last urinated around 1500 today. States she's had poor PO intake.

## 2022-11-23 NOTE — ED Notes (Signed)
Lab notified to add on urine culture 

## 2022-11-23 NOTE — Discharge Instructions (Addendum)
We evaluated you for your abdominal pain.  Your laboratory tests were reassuring and your CT scan did not show any dangerous process.  We discussed your symptoms with the on-call GI doctor who is covering for Dr. Elnoria Howard, they have reviewed your results and believe that it is safe to go home.  Please take at 1000 mg of Tylenol every 6 hours as needed for pain.  I have also prescribed you a small amount of oxycodone which she can take as needed for pain not controlled by Tylenol.  I have prescribed you some nausea medication which you can take every 8 hours as needed for nausea or vomiting.  Since your testing and CT scan are very reassuring, I think it is safe to go home.  If you do develop any new or worsening symptoms such as fevers or chills, uncontrolled vomiting, worsening or severe abdominal pain, bloody stools, lightheadedness or fainting, or any other concerning symptoms please return to the emergency department.  Please call Dr. Elnoria Howard on Monday for follow-up.  We also noticed that your urine had a few white blood cells in it.  Your urine sample was contaminated by skin cells, so I think it is unlikely you have a urinary infection, especially since you have no symptoms of urine infection.  We have sent a urine culture to see if any bacteria grow.  We can call you if this comes back positive.

## 2022-11-23 NOTE — ED Notes (Signed)
Report received from Belenda Cruise RN. Assumed care of pt at this time.

## 2022-11-23 NOTE — ED Notes (Signed)
ED Provider at bedside. 

## 2022-11-23 NOTE — ED Provider Notes (Signed)
[place] [facility]ERGENC[facility] DEPARTMENT AT [facility] Provider Note  CSN: [id number] Arrival date & time: 11/23/22 1500  Chief Complaint(s) Abdominal Pain  HPI Kayla Berger is a 62 y.o. female history of coronary artery disease status post CABG, diabetes, hypertension, presenting to the emergency department with abdominal pain.  Patient reports right-sided abdominal pain in the right upper and lower quadrant.  She reports that began yesterday.  She reports that it began late yesterday evening after her colonoscopy.  It was worse this morning, has somewhat improved.  Initially went to an urgent care where x-rays were obtained that were negative but was advised to come to the hospital for further evaluation.  No vomiting, some nausea.  No fevers or chills.  No chest pain.  No leg swelling.  No diarrhea.   Past Medical History 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/2022   Shoulder pain    Thyroid disease    Trochanteric bursitis of left hip    Vaginal cysts    4 have been removed   Patient Active Problem List   Diagnosis Date Noted   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/2022   LAD stenosis 04/05/2022   Coronary artery disease 04/05/2022   Malnutrition of moderate degree 08/07/2021   Acute kidney failure (HCC) 08/02/2021   Hyperkalemia, diminished renal excretion 08/02/2021   Lactic acidosis 08/02/2021   Closed compression fracture of  L2 vertebra (HCC) 10/31/2016   Hyperlipidemia 09/23/2012   Hypothyroid 09/23/2012   Allergic rhinitis 09/23/2012   History of migraine headaches 09/23/2012   DM2 (diabetes mellitus, type 2) (HCC) 06/26/2012   HTN (hypertension) 06/26/2012   Home Medication(s) Prior to Admission medications   Medication Sig Start Date End Date Taking? Authorizing Provider  ondansetron (ZOFRAN-ODT) 4 MG disintegrating tablet Take 1 tablet (4 mg total) by mouth every 8 (eight) hours as needed for nausea or vomiting. 11/23/22  Yes Lonell Grandchild, MD  oxyCODONE (ROXICODONE) 5 MG immediate release tablet Take 1 tablet (5 mg total) by mouth every 6 (six) hours as needed for severe pain (pain score 7-10) or breakthrough pain. 11/23/22  Yes Lonell Grandchild, MD  Alpha Lipoic Acid 200 MG CAPS Take 200 mg by mouth at bedtime.    [provider]  aspirin 81 MG chewable tablet Chew 81 mg by mouth in the morning.    [provider]  atorvastatin (LIPITOR) 40 MG tablet Take 40 mg by mouth at bedtime.    [provider]  Biotin 40981 MCG TABS Take 10,000 mcg by mouth every evening.    [provider]  cetirizine (ZYRTEC) 10 MG tablet Take 10 mg by mouth in the morning.    [provider]  Cholecalciferol 25 MCG (1000 UT) capsule Take 1,000  Units by mouth 2 (two) times daily.    [provider]  clopidogrel (PLAVIX) 75 MG tablet Take 1 tablet (75 mg total) by mouth daily. 08/14/22   Yates Decamp, MD  CRANBERRY PO Take 15,000 mg by mouth in the morning.    [provider]  denosumab (PROLIA) 60 MG/ML SOSY injection Inject 60 mg into the skin every 6 (six) months. 06/18/22   [provider]  DULoxetine (CYMBALTA) 60 MG capsule Take 60 mg by mouth 2 (two) times daily.    [provider]  furosemide (LASIX) 20 MG tablet TAKE 1 TABLET BY MOUTH ONCE DAILY AS NEEDED FOR FLUID 10/21/22   Yates Decamp, MD  gabapentin (NEURONTIN) 800 MG tablet Take 400 mg  by mouth at bedtime. 08/15/21   [provider]  HUMALOG KWIKPEN 100 UNIT/ML KwikPen Inject 5-12 Units into the skin 3 (three) times daily as needed (for a BGL greater than 100).    [provider]  insulin glargine, 2 Unit Dial, (TOUJEO MAX SOLOSTAR) 300 UNIT/ML Solostar Pen Inject 10 Units into the skin at bedtime. Patient taking differently: Inject 45 Units into the skin at bedtime. 08/08/21   Marguerita Merles Latif, DO  metoprolol tartrate (LOPRESSOR) 25 MG tablet Take 1 tablet by mouth twice daily 06/10/22   Yates Decamp, MD  Multiple Vitamins-Minerals (CENTRUM WOMEN) TABS Take 1 tablet by mouth daily with breakfast.    [provider]  Nutritional Supplements (EQUATE PO) Take by mouth.    [provider]  pantoprazole (PROTONIX) 40 MG tablet Take 1 tablet (40 mg total) by mouth 2 (two) times daily. Patient taking differently: Take 40 mg by mouth daily. 08/08/21   Marguerita Merles Latif, DO  polyethylene glycol powder (GLYCOLAX/MIRALAX) 17 GM/SCOOP powder Take 1 Container by mouth once.    [provider]  potassium chloride (KLOR-CON) 10 MEQ tablet TAKE 1 TABLET BY MOUTH ONCE DAILY AS NEEDED WITH FUROSEMIDE ONLY 07/16/22   Yates Decamp, MD  RYBELSUS 3 MG TABS Take 1 tablet by mouth daily. 08/23/22   [provider]  zinc gluconate 50 MG tablet Take 50 mg by mouth in the morning.    [provider]                                                                                                                                    Past Surgical History Past Surgical History:  Procedure Laterality Date   APPENDECTOMY  1967   ARTERIAL BYPASS SURGRY  07/18/2022   CARDIAC CATHETERIZATION Bilateral    cataracts   CHOLECYSTECTOMY N/A 02/16/2021   Procedure: LAPAROSCOPIC CHOLECYSTECTOMY;  Surgeon: [name], MD;  Location: [facility] OR;  Service: General;  Laterality: N/A;   CORONARY PRESSURE/FFR STUDY N/A 04/05/2022   Procedure: INTRAVASCULAR PRESSURE  WIRE/FFR STUDY;  Surgeon: Yates Decamp, MD;  Location: [facility] INVASIVE CV LAB;  Service: Cardiovascular;  Laterality: N/A;   KYPHOPLASTY  N/A 10/31/2016   Procedure: KYPHOPLASTY LUMBAR ONE, LUMBAR TWO AND  LUMBAR THREE;  Surgeon: Venita Lick, MD;  Location: [facility] OR;  Service: Orthopedics;  Laterality: N/A;   LEFT HEART CATH AND CORONARY ANGIOGRAPHY N/A 04/05/2022   Procedure: LEFT HEART CATH AND CORONARY ANGIOGRAPHY;  Surgeon: Yates Decamp, MD;  Location: [facility] INVASIVE CV LAB;  Service: Cardiovascular;  Laterality: N/A;   nsvd  2001   normal vaginal del   sonobysterogram, laporoscopy  1997   fertility test   Family History Family History  Problem Relation Age of Onset   Hypertension Mother    Diabetes Mother    Stroke Mother 51       first one, (pt had 3 total)   Hypertension Father    Cancer Father    Hypertension Sister    Heart disease Sister    Hypertension Maternal Grandmother    Heart attack Maternal Grandfather    Diabetes Maternal Grandfather    Diabetes Paternal Grandfather     Social History Social History   Tobacco Use   Smoking status: Never   Smokeless tobacco: Never  Vaping Use   Vaping status: Never Used  Substance Use Topics   Alcohol use: No   Drug use: No   Allergies Hydrocodone  Review of Systems Review of Systems  All other systems reviewed and are negative.   Physical Exam Vital Signs  I have reviewed the triage vital signs BP (!) 159/90   Pulse 90   Temp 99.1 F (37.3 C) (Oral)   Resp 19   LMP 10/06/2012   SpO2 99%  Physical Exam Vitals and nursing note reviewed.  Constitutional:      General: She is not in acute distress.    Appearance: She is well-developed.  HENT:     Head: Normocephalic and atraumatic.     Mouth/Throat:     Mouth: Mucous membranes are moist.  Eyes:     Pupils: Pupils are equal, round, and reactive to light.  Cardiovascular:     Rate and Rhythm: Normal rate and regular rhythm.     Heart sounds: No murmur  heard. Pulmonary:     Effort: Pulmonary effort is normal. No respiratory distress.     Breath sounds: Normal breath sounds.  Abdominal:     General: Abdomen is flat.     Palpations: Abdomen is soft.     Tenderness: There is abdominal tenderness in the right upper quadrant and right lower quadrant.  Musculoskeletal:        General: No tenderness.     Right lower leg: No edema.     Left lower leg: No edema.  Skin:    General: Skin is warm and dry.  Neurological:     General: No focal deficit present.     Mental Status: She is alert. Mental status is at baseline.  Psychiatric:        Mood and Affect: Mood normal.        Behavior: Behavior normal.     ED Results and Treatments Labs (all labs ordered are listed, but only abnormal results are displayed) Labs Reviewed  COMPREHENSIVE METABOLIC PANEL - Abnormal; Notable for the following components:      Result Value   Potassium 3.3 (*)    Glucose, Bld 129 (*)    Calcium 8.0 (*)    All other components within normal limits  CBC WITH DIFFERENTIAL/PLATELET - Abnormal; Notable for the following components:   RDW 16.0 (*)  All other components within normal limits  URINALYSIS, W/ REFLEX TO CULTURE (INFECTION SUSPECTED) - Abnormal; Notable for the following components:   APPearance HAZY (*)    Specific Gravity, Urine >1.046 (*)    Ketones, ur 20 (*)    Protein, ur 100 (*)    Leukocytes,Ua SMALL (*)    Bacteria, UA MANY (*)    All other components within normal limits  URINE CULTURE  LIPASE, BLOOD                                                                                                                          Radiology CT ABDOMEN PELVIS W CONTRAST  Result Date: 11/23/2022 CLINICAL DATA:  Severe abdominal pain PA and last night following colonoscopy yesterday. EXAM: CT ABDOMEN AND PELVIS WITH CONTRAST TECHNIQUE: Multidetector CT imaging of the abdomen and pelvis was performed using the standard protocol following bolus  administration of intravenous contrast. RADIATION DOSE REDUCTION: This exam was performed according to the departmental dose-optimization program which includes automated exposure control, adjustment of the mA and/or kV according to patient size and/or use of iterative reconstruction technique. CONTRAST:  75mL OMNIPAQUE IOHEXOL 350 MG/ML SOLN COMPARISON:  CT of the abdomen and pelvis 01/19/2022 FINDINGS: Lower chest: Mild dependent atelectasis is present on the right. Lungs are otherwise clear. The heart size is normal. Coronary artery calcifications are present. No significant pleural or pericardial effusion is present. Hepatobiliary: Mild fatty infiltration of the liver present. The common bile duct is within normal limits following cholecystectomy. No focal hepatic lesions are present. Pancreas: Unremarkable. No pancreatic ductal dilatation or surrounding inflammatory changes. Spleen: Normal in size without focal abnormality. Adrenals/Urinary Tract: Fullness of the adrenal glands is present bilaterally without a discrete lesion. The kidneys are unremarkable. No stone or mass lesion present. The ureters are within normal limits bilaterally. The urinary bladder is normal. Stomach/Bowel: The stomach and duodenum are within normal limits. Small bowel is unremarkable. The terminal ileum is within normal limits. The appendix is not discretely visualized and may be surgically absent. The ascending and transverse colon are within normal limits. Descending colon is unremarkable. Diverticular changes are present in the sigmoid colon without inflammatory changes to suggest diverticulitis. Vascular/Lymphatic: Atherosclerotic calcifications are present in aorta and branch vessels. No aneurysm is present. No significant adenopathy is present. Reproductive: Uterus and bilateral adnexa are unremarkable. Other: No abdominal wall hernia or abnormality. No abdominopelvic ascites. Musculoskeletal: Remote compression fractures are  present at T11, L1, L2, L3 and L4 without significant change. Previous spinal augmentation is noted at L1-L2 and L3. Median sternotomy is noted. Alignment is anatomic. Straightening of the normal lumbar lordosis is similar the prior exam. The bony pelvis is within normal limits. The hips are located and within normal limits bilaterally. IMPRESSION: 1. No acute or focal lesion to explain the patient's symptoms. No bowel perforation or pneumoperitoneum. 2. Sigmoid diverticulosis without diverticulitis. 3. Hepatic steatosis. 4. Remote compression fractures at T11, L1, L2, L3 and L4  without significant change. Previous spinal augmentation is noted at L1-L2 and L3. 5.  Aortic Atherosclerosis (ICD10-I70.0). Electronically Signed   By: Marin Roberts M.D.   On: 11/23/2022 19:33    Pertinent labs & imaging results that were available during my care of the patient were reviewed by me and considered in my medical decision making (see MDM for details).  Medications Ordered in ED Medications  HYDROmorphone (DILAUDID) injection 0.5 mg (0.5 mg Intravenous Given 11/23/22 1732)  ondansetron (ZOFRAN) injection 4 mg (4 mg Intravenous Given 11/23/22 1732)  iohexol (OMNIPAQUE) 350 MG/ML injection 75 mL (75 mLs Intravenous Contrast Given 11/23/22 1809)  potassium chloride SA (KLOR-CON M) CR tablet 40 mEq (40 mEq Oral Given 11/23/22 2008)  ondansetron (ZOFRAN) injection 4 mg (4 mg Intravenous Given 11/23/22 2042)  sodium chloride 0.9 % bolus 1,000 mL (0 mLs Intravenous Stopped 11/23/22 2212)                                                                                                                                     Procedures Procedures  (including critical care time)  Medical Decision Making / ED Course   MDM:  62 year old female presenting to the emergency department abdominal pain.  Patient well-appearing, physical exam with right upper and lower quadrant tenderness.  Differential includes  complication from recent procedure such as perforation, abscess, infection, postoperative pain.  Could also represent nonrelated process such as appendicitis, cholecystitis, obstruction, volvulus.  Will obtain CT scan to further evaluate cause of patient's symptoms.  Will obtain basic labs including LFTs and lipase.  Will reassess.  If CT and labs are reassuring anticipate discharge with outpatient GI follow-up.  Clinical Course as of 11/23/22 2244  Sat Nov 23, 2022  1948 Labs are reassuring without leukocytosis.  No left shift or elevated neutrophil count.  Pain improved after pain control.  CMP with mild hypokalemia, will give dose of potassium.  The patient is feeling better and would like to go home.  Unclear cause of her symptoms but with reassuring labs and reassuring CT scan, low concern for any acute dangerous pathology.  Discussed with Dr. Adela Lank who is on-call for GI, covering for Dr. Elnoria Howard.  He agrees that with normal labs, normal CT scan, improvement in symptoms, there is really no reason to admit the patient for observation or for any other consults. [WS]  2049 Patient developed episode of vomiting after receiving potassium supplementation.  Will give nausea medication.  Will give additional fluids.  If patient unable to tolerate p.o. may require admission.  Patient's sister over the phone requested that I speak to a surgeon about the patient.  With normal labs and normal CT scan, improving symptoms, there is no clinical indication for a general surgery consult.  Discussed that we do not identify the cause of every medical complaint in the emergency department but with the patient's reassuring results I believe the chance of  serious injury, complication from recent colonoscopy, or other acute dangerous intra-abdominal process is extremely low.  It is still possible that the patient symptoms may worsen and she may need to return for reevaluation, which is always a possibility, and that is why we  give strict return precautions. [WS]  2242 Patient feeling better.  She is able to tolerate p.o. with some crackers and ginger ale.  Her abdominal pain is improved.  She would prefer to be discharged.  I discussed extensively her results and indications for her to return to the emergency department.  I will prescribe her some pain medication and nausea medicine.  Discussed safe use of this medication including avoiding taking it while drinking alcohol or operating machinery, driving.  The patient feels comfortable with the plan and understands reasons to return to the emergency department.  All questions answered. [WS]    Clinical Course User Index [WS] Lonell Grandchild, MD     Additional history obtained: -Additional history obtained from family -External records from outside source obtained and reviewed including: Chart review including previous notes, labs, imaging, consultation notes including endoscopy record, GI notes   Lab Tests: -I ordered, reviewed, and interpreted labs.   The pertinent results include:   Labs Reviewed  COMPREHENSIVE METABOLIC PANEL - Abnormal; Notable for the following components:      Result Value   Potassium 3.3 (*)    Glucose, Bld 129 (*)    Calcium 8.0 (*)    All other components within normal limits  CBC WITH DIFFERENTIAL/PLATELET - Abnormal; Notable for the following components:   RDW 16.0 (*)    All other components within normal limits  URINALYSIS, W/ REFLEX TO CULTURE (INFECTION SUSPECTED) - Abnormal; Notable for the following components:   APPearance HAZY (*)    Specific Gravity, Urine >1.046 (*)    Ketones, ur 20 (*)    Protein, ur 100 (*)    Leukocytes,Ua SMALL (*)    Bacteria, UA MANY (*)    All other components within normal limits  URINE CULTURE  LIPASE, BLOOD    Notable for no leukocytosis, no AKI. Bacturia with contaminated urine sample and no urinary symptoms   Imaging Studies ordered: I ordered imaging studies including CT  abdomen pelvis  On my interpretation imaging demonstrates no acute process e.g. perforation I independently visualized and interpreted imaging. I agree with the radiologist interpretation   Medicines ordered and prescription drug management: Meds ordered this encounter  Medications   HYDROmorphone (DILAUDID) injection 0.5 mg   ondansetron (ZOFRAN) injection 4 mg   iohexol (OMNIPAQUE) 350 MG/ML injection 75 mL   potassium chloride SA (KLOR-CON M) CR tablet 40 mEq   ondansetron (ZOFRAN) injection 4 mg   sodium chloride 0.9 % bolus 1,000 mL   oxyCODONE (ROXICODONE) 5 MG immediate release tablet    Sig: Take 1 tablet (5 mg total) by mouth every 6 (six) hours as needed for severe pain (pain score 7-10) or breakthrough pain.    Dispense:  10 tablet    Refill:  0   ondansetron (ZOFRAN-ODT) 4 MG disintegrating tablet    Sig: Take 1 tablet (4 mg total) by mouth every 8 (eight) hours as needed for nausea or vomiting.    Dispense:  12 tablet    Refill:  0    -I have reviewed the patients home medicines and have made adjustments as needed   Consultations Obtained: I requested consultation with the gastroenterologist Dr. Adela Lank,  and discussed lab and imaging  findings as well as pertinent plan - they recommend: outpatient follow up    Cardiac Monitoring: The patient was maintained on a cardiac monitor.  I personally viewed and interpreted the cardiac monitored which showed an underlying rhythm of: NSR  Social Determinants of Health:  Diagnosis or treatment significantly limited by social determinants of health: obesity   Reevaluation: After the interventions noted above, I reevaluated the patient and found that their symptoms have improved  Co morbidities that complicate the patient evaluation  Past Medical History:  Diagnosis Date   Arthritis    Complication of anesthesia    "hard to awaken"   Dysrhythmia    irregular heartbeat- rx   GERD (gastroesophageal reflux disease)     History of kidney stones    Hypertension    Hypothyroidism    synthroid in past no med now   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/2022   Shoulder pain    Thyroid disease    Trochanteric bursitis of left hip    Vaginal cysts    4 have been removed      Dispostion: Disposition decision including need for hospitalization was considered, and patient discharged from emergency department.    Final Clinical Impression(s) / ED Diagnoses Final diagnoses:  Right sided abdominal pain     This chart was dictated using voice recognition software.  Despite best efforts to proofread,  errors can occur which can change the documentation meaning.    Lonell Grandchild, MD 11/23/22 2244

## 2022-11-23 NOTE — ED Triage Notes (Signed)
Pt had colonoscopy yesterday and last night when she got home started having severe right sided abd pain, pt did have 4 polyps removed with the biggest one being in ths ascending colon. No Bm since the colonoscopy.

## 2022-11-23 NOTE — ED Notes (Signed)
ED Provider back at bedside. 

## 2022-11-23 NOTE — ED Notes (Signed)
Pt educated on no driving or lifting any heavy equipment while taking narcotic pain meds. Pt voiced understanding.

## 2022-11-23 NOTE — Telephone Encounter (Signed)
Telephone call to on-call service over the weekend. Covering for Dr. Elnoria Howard  11/23/2022 10:05 AM  Patient describes she had a colonoscopy yesterday.  Per report there were 4 polyps 3-10 mm in size removed from the ascending colon.  Procedure was noted to be difficult with difficult positioning noted as "extremely difficult" as the diverticular disease prevented a stable or favorable position.  She was placed supine at some point.  Patient tells me she got home and really slept after the procedure and then around 10/11 pm she awoke with severe pain on the right side of her abdomen and notes that she really feels like she cannot take a deep breath at all or really move at all so she has tried not to change positions due to the increase in pain rated as a 10/10 that radiates through to her back a little bit.  She does not have any nausea, vomiting or fever.  Has still not passed a stool since time of her procedure.  Recommend the patient proceed to the ER for an abdominal x-ray to ensure there has not been a complication especially considering difficult positioning during time of colonoscopy yesterday.  She verbalized understanding.  Hyacinth Meeker, PA-C

## 2022-11-25 ENCOUNTER — Encounter (HOSPITAL_COMMUNITY): Payer: 59

## 2022-11-25 ENCOUNTER — Encounter (HOSPITAL_COMMUNITY): Payer: Self-pay | Admitting: Gastroenterology

## 2022-11-25 LAB — URINE CULTURE: Culture: >=100000 — AB

## 2022-11-25 LAB — SURGICAL PATHOLOGY

## 2022-11-26 ENCOUNTER — Telehealth (HOSPITAL_BASED_OUTPATIENT_CLINIC_OR_DEPARTMENT_OTHER): Payer: Self-pay | Admitting: *Deleted

## 2022-11-26 NOTE — Telephone Encounter (Signed)
Post ED Visit - Positive Culture Follow-up  Culture report reviewed by antimicrobial stewardship pharmacist: [facility] [x]  Daylene Posey, Pharm.D. []  Celedonio Miyamoto, Pharm.D., BCPS AQ-ID []  Garvin Fila, Pharm.D., BCPS []  Georgina Pillion, Pharm.D., BCPS []  [place], [place].D., BCPS, AAHIVP []  Estella Husk, Pharm.D., BCPS, AAHIVP []  Lysle Pearl, PharmD, BCPS []  Phillips Climes, PharmD, BCPS []  Agapito Games, PharmD, BCPS []  Verlan Friends, PharmD []  Mervyn Gay, PharmD, BCPS []  Vinnie Level, PharmD  [facility] []  Len Childs, PharmD []  Greer Pickerel, PharmD []  Adalberto Cole, PharmD []  Perlie Gold, Rph []  Lonell Face) Jean Rosenthal, PharmD []  Earl Many, PharmD []  Junita Push, PharmD []  Dorna Leitz, PharmD []  Terrilee Files, PharmD []  Lynann Beaver, PharmD []  Keturah Barre, PharmD []  Loralee Pacas, PharmD []  Bernadene Person, PharmD   Positive urine culture No UTI symptoms, (+) contamination and no further patient follow-up is required at this time.  [facility] 11/26/2022, 8:07 AM

## 2022-11-27 ENCOUNTER — Encounter (HOSPITAL_COMMUNITY)
Admission: RE | Admit: 2022-11-27 | Discharge: 2022-11-27 | Disposition: A | Discharge status: Home / Self Care | Payer: 59 | Source: Ambulatory Visit | Attending: Cardiology

## 2022-11-27 DIAGNOSIS — Z951 Presence of aortocoronary bypass graft: Secondary | ICD-10-CM

## 2022-11-27 DIAGNOSIS — Z48812 Encounter for surgical aftercare following surgery on the circulatory system: Secondary | ICD-10-CM | POA: Diagnosis not present

## 2022-11-29 ENCOUNTER — Encounter (HOSPITAL_COMMUNITY)
Admission: RE | Admit: 2022-11-29 | Discharge: 2022-11-29 | Disposition: A | Discharge status: Home / Self Care | Payer: 59 | Source: Ambulatory Visit | Attending: Cardiology | Admitting: Cardiology

## 2022-11-29 DIAGNOSIS — Z951 Presence of aortocoronary bypass graft: Secondary | ICD-10-CM | POA: Diagnosis present

## 2022-12-02 ENCOUNTER — Encounter (HOSPITAL_COMMUNITY)
Admission: RE | Admit: 2022-12-02 | Discharge: 2022-12-02 | Disposition: A | Discharge status: Home / Self Care | Payer: 59 | Source: Ambulatory Visit | Attending: Cardiology | Admitting: Cardiology

## 2022-12-02 DIAGNOSIS — Z951 Presence of aortocoronary bypass graft: Secondary | ICD-10-CM | POA: Diagnosis not present

## 2022-12-04 ENCOUNTER — Encounter (HOSPITAL_COMMUNITY)
Admission: RE | Admit: 2022-12-04 | Discharge: 2022-12-04 | Disposition: A | Discharge status: Home / Self Care | Payer: 59 | Source: Ambulatory Visit | Attending: Cardiology | Admitting: Cardiology

## 2022-12-04 DIAGNOSIS — Z951 Presence of aortocoronary bypass graft: Secondary | ICD-10-CM | POA: Diagnosis not present

## 2022-12-06 ENCOUNTER — Encounter (HOSPITAL_COMMUNITY)
Admission: RE | Admit: 2022-12-06 | Discharge: 2022-12-06 | Disposition: A | Discharge status: Home / Self Care | Payer: 59 | Source: Ambulatory Visit | Attending: Cardiology | Admitting: Cardiology

## 2022-12-06 DIAGNOSIS — Z951 Presence of aortocoronary bypass graft: Secondary | ICD-10-CM

## 2022-12-09 ENCOUNTER — Encounter (HOSPITAL_COMMUNITY)
Admission: RE | Admit: 2022-12-09 | Discharge: 2022-12-09 | Disposition: A | Discharge status: Home / Self Care | Payer: 59 | Source: Ambulatory Visit | Attending: Cardiology | Admitting: Cardiology

## 2022-12-09 DIAGNOSIS — Z951 Presence of aortocoronary bypass graft: Secondary | ICD-10-CM

## 2022-12-11 ENCOUNTER — Encounter (HOSPITAL_COMMUNITY)
Admission: RE | Admit: 2022-12-11 | Discharge: 2022-12-11 | Disposition: A | Discharge status: Home / Self Care | Payer: 59 | Source: Ambulatory Visit | Attending: Cardiology

## 2022-12-11 DIAGNOSIS — Z951 Presence of aortocoronary bypass graft: Secondary | ICD-10-CM

## 2022-12-13 ENCOUNTER — Encounter (HOSPITAL_COMMUNITY)
Admission: RE | Admit: 2022-12-13 | Discharge: 2022-12-13 | Disposition: A | Discharge status: Home / Self Care | Payer: 59 | Source: Ambulatory Visit | Attending: Cardiology | Admitting: Cardiology

## 2022-12-13 DIAGNOSIS — Z951 Presence of aortocoronary bypass graft: Secondary | ICD-10-CM

## 2022-12-16 ENCOUNTER — Encounter (HOSPITAL_COMMUNITY)
Admission: RE | Admit: 2022-12-16 | Discharge: 2022-12-16 | Disposition: A | Discharge status: Home / Self Care | Payer: 59 | Source: Ambulatory Visit | Attending: Cardiology | Admitting: Cardiology

## 2022-12-16 DIAGNOSIS — Z951 Presence of aortocoronary bypass graft: Secondary | ICD-10-CM

## 2022-12-16 NOTE — Progress Notes (Signed)
Cardiac Individual Treatment Plan  Patient Details  Name: Kayla Berger MRN: [id number] Date of Birth: 21-Sep-1960 Referring Provider:   Flowsheet Row INTENSIVE CARDIAC REHAB ORIENT from 09/02/2022 in [facility]  Referring Provider Yates Decamp, MD       Initial Encounter Date:  Flowsheet Row INTENSIVE CARDIAC REHAB ORIENT from 09/02/2022 in [facility]  Date 09/02/22       Visit Diagnosis: S/P CABG x 2  Patient's Home Medications on Admission:  Current Outpatient Medications:    Alpha Lipoic Acid 200 MG CAPS, Take 200 mg by mouth at bedtime., Disp: , Rfl:    aspirin 81 MG chewable tablet, Chew 81 mg by mouth in the morning., Disp: , Rfl:    atorvastatin (LIPITOR) 40 MG tablet, Take 40 mg by mouth at bedtime., Disp: , Rfl:    Biotin 40981 MCG TABS, Take 10,000 mcg by mouth every evening., Disp: , Rfl:    cetirizine (ZYRTEC) 10 MG tablet, Take 10 mg by mouth in the morning., Disp: , Rfl:    Cholecalciferol 25 MCG (1000 UT) capsule, Take 1,000 Units by mouth 2 (two) times daily., Disp: , Rfl:    clopidogrel (PLAVIX) 75 MG tablet, Take 1 tablet (75 mg total) by mouth daily., Disp: 90 tablet, Rfl: 1   CRANBERRY PO, Take 15,000 mg by mouth in the morning., Disp: , Rfl:    denosumab (PROLIA) 60 MG/ML SOSY injection, Inject 60 mg into the skin every 6 (six) months., Disp: , Rfl:    DULoxetine (CYMBALTA) 60 MG capsule, Take 60 mg by mouth 2 (two) times daily., Disp: , Rfl:    furosemide (LASIX) 20 MG tablet, TAKE 1 TABLET BY MOUTH ONCE DAILY AS NEEDED FOR FLUID, Disp: 46 tablet, Rfl: 2   gabapentin (NEURONTIN) 800 MG tablet, Take 400 mg by mouth at bedtime., Disp: , Rfl:    HUMALOG KWIKPEN 100 UNIT/ML KwikPen, Inject 5-12 Units into the skin 3 (three) times daily as needed (for a BGL greater than 100)., Disp: , Rfl:    insulin glargine, 2 Unit Dial, (TOUJEO MAX SOLOSTAR) 300  UNIT/ML Solostar Pen, Inject 10 Units into the skin at bedtime. (Patient taking differently: Inject 45 Units into the skin at bedtime.), Disp: , Rfl:    metoprolol tartrate (LOPRESSOR) 25 MG tablet, Take 1 tablet by mouth twice daily, Disp: 180 tablet, Rfl: 0   Multiple Vitamins-Minerals (CENTRUM WOMEN) TABS, Take 1 tablet by mouth daily with breakfast., Disp: , Rfl:    Nutritional Supplements (EQUATE PO), Take by mouth., Disp: , Rfl:    ondansetron (ZOFRAN-ODT) 4 MG disintegrating tablet, Take 1 tablet (4 mg total) by mouth every 8 (eight) hours as needed for nausea or vomiting., Disp: 12 tablet, Rfl: 0   oxyCODONE (ROXICODONE) 5 MG immediate release tablet, Take 1 tablet (5 mg total) by mouth every 6 (six) hours as needed for severe pain (pain score 7-10) or breakthrough pain., Disp: 10 tablet, Rfl: 0   pantoprazole (PROTONIX) 40 MG tablet, Take 1 tablet (40 mg total) by mouth 2 (two) times daily. (Patient taking differently: Take 40 mg by mouth daily.), Disp: 60 tablet, Rfl: 0   polyethylene glycol powder (GLYCOLAX/MIRALAX) 17 GM/SCOOP powder, Take 1 Container by mouth once., Disp: , Rfl:    potassium chloride (KLOR-CON) 10 MEQ tablet, TAKE 1 TABLET BY MOUTH ONCE DAILY AS NEEDED WITH FUROSEMIDE ONLY, Disp: 30 tablet, Rfl:  0   RYBELSUS 3 MG TABS, Take 1 tablet by mouth daily., Disp: , Rfl:    zinc gluconate 50 MG tablet, Take 50 mg by mouth in the morning., Disp: , Rfl:   Past Medical History: 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 07/26/2022   Shoulder pain    Thyroid disease    Trochanteric bursitis of left hip    Vaginal cysts    4 have been removed    Tobacco Use: Social History   Tobacco Use  Smoking Status Never  Smokeless Tobacco Never    Labs: Review Flowsheet  More data exists      Latest Ref Rng & Units 10/31/2016 02/13/2021 08/02/2021 08/04/2021 01/19/2022  Labs for ITP Cardiac and Pulmonary Rehab  Hemoglobin A1c 4.8 - 5.6 % 12.3  7.1  - 6.6  -  Bicarbonate 20.0 - 28.0 mmol/L - - 44.2  42.1  - -  TCO2 22 - 32 mmol/L - - 44  - 28   O2 Saturation % - - 55.1  91  - -    Details       Multiple values from one day are sorted in reverse-chronological order         Capillary Blood Glucose: Lab Results  Component Value Date   GLUCAP 118 (H) 11/22/2022   GLUCAP 154 (H) 10/11/2022   GLUCAP 187 (H) 10/11/2022   GLUCAP 159 (H) 09/11/2022   GLUCAP 82 09/09/2022     Exercise Target Goals: Exercise Program Goal: Individual exercise prescription set using results from initial 6 min walk test and THRR while considering  patient's activity barriers and safety.   Exercise Prescription Goal: Initial exercise prescription builds to 30-45 minutes a day of aerobic activity, 2-3 days per week.  Home exercise guidelines will be given to patient during program as part of exercise prescription that the participant will acknowledge.  Activity Barriers & Risk Stratification:  Activity Barriers & Cardiac Risk Stratification - 09/02/22 1206       Activity Barriers & Cardiac Risk Stratification   Activity Barriers Back Problems;Arthritis;Neck/Spine Problems;History of Falls;Balance Concerns;Deconditioning;Joint Problems    Cardiac Risk Stratification High   <5 METs on            6 Minute Walk:  6 Minute Walk     Row Name 09/02/22 1205         6 Minute Walk   Phase Initial     Distance 1680 feet     Walk Time 6 minutes     # of Rest Breaks 0     MPH  3.18     METS 3.82     RPE 13     Perceived Dyspnea  0     VO2 Peak 13.39     Symptoms Yes (comment)     Comments 6/10  Bilateral hip pain     Resting HR 92 bpm     Resting BP 110/74     Resting Oxygen Saturation  96 %     Exercise Oxygen Saturation  during 6 min walk 98 %     Max Ex. HR 114 bpm     Max Ex. BP 146/82     2 Minute Post BP 124/82              Oxygen Initial Assessment:   Oxygen Re-Evaluation:   Oxygen Discharge (Final Oxygen Re-Evaluation):   Initial Exercise Prescription:  Initial Exercise Prescription - 09/02/22 1200       Date of Initial Exercise RX and Referring Provider   Date 09/02/22    Referring Provider Yates Decamp, MD    Expected Discharge Date 11/27/22      Recumbant Bike   Level 1    RPM 55    Watts 30    Minutes 15    METs 2.5      NuStep   Level 1    SPM 60    Minutes 15    METs 2      Prescription Details   Frequency (times per week) 3    Duration Progress to 30 minutes of continuous aerobic without signs/symptoms of physical distress      Intensity   THRR 40-80% of Max Heartrate 64-127    Ratings of Perceived Exertion 11-13    Perceived Dyspnea 0-4      Progression   Progression Continue progressive overload as per policy without signs/symptoms or physical distress.      Resistance Training   Training Prescription Yes    Weight 2    Reps 10-15             Perform Capillary Blood Glucose checks as needed.  Exercise Prescription Changes:   Exercise Prescription Changes     Row Name 09/09/22 1300 09/23/22 1202 10/02/22 0830 10/23/22 0820 11/06/22 0828     Response to Exercise   Blood Pressure (Admit) 122/72 122/66 124/74 128/80 120/76   Blood Pressure (Exercise) 130/68 130/60 104/64 130/70 132/80   Blood Pressure (Exit) 108/74 108/72 98/60 106/70 122/78   Heart Rate (Admit) 96 bpm 78 bpm 90 bpm 96 bpm 95 bpm   Heart Rate (Exercise) 109 bpm 105 bpm 114 bpm 121 bpm 117 bpm   Heart Rate (Exit) 97 bpm 87 bpm  95 bpm 97 bpm 93 bpm   Rating of Perceived Exertion (Exercise) 8 11.5 11 11  11.5   Perceived Dyspnea (Exercise) -- 0 0 0 0   Symptoms none chronic back pain chronic back pain chronic back pain chronic back pain   Comments skipped weights d/t low BS REVD MET's and goals REVD MET's REVD MET's and goals REVD MET's   Duration Continue with 30 min of aerobic exercise without signs/symptoms of physical distress. Continue with 30 min of aerobic exercise without signs/symptoms of physical distress. Continue with 30 min of aerobic exercise without signs/symptoms of physical distress. Continue with 30 min of aerobic exercise without signs/symptoms of physical distress. Continue with 30 min of aerobic exercise without signs/symptoms of physical distress.   Intensity THRR unchanged THRR unchanged THRR unchanged THRR unchanged THRR unchanged     Progression   Progression Continue to progress workloads to maintain intensity without signs/symptoms of physical distress. Continue to progress workloads to maintain intensity without signs/symptoms of physical distress. Continue to progress workloads to maintain intensity without signs/symptoms of physical  distress. Continue to progress workloads to maintain intensity without signs/symptoms of physical distress. Continue to progress workloads to maintain intensity without signs/symptoms of physical distress.   Average METs 2.4 2.5 2.5 2.6 3.3     Resistance Training   Training Prescription No  Low BS No  Low BS Yes  Low BS No No   Weight -- -- 2 lbs wts -- --   Reps -- -- 10-15 -- --   Time -- -- 10 Minutes -- --     Recumbant Bike   Level 1 1 1 2 3    RPM 73 86 86 99 --   Watts 14 21 21  33 --   Minutes 15 15 15 15 15    METs 2.5 2.3 2.3 2.7 3.6     NuStep   Level 1 1 1 2 2    SPM 78 98 98 98 106   Minutes 15 15 15 15 15    METs 2.2 2.7 2.7 2.9 3    Row Name 11/15/22 1422 12/02/22 1718 12/16/22 1424         Response to Exercise   Blood Pressure (Admit)  124/80 138/82 132/70     Blood Pressure (Exercise) --  Over 10 sessions no exercise BP needed --  Over 10 sessions no exercise BP needed --  Over 10 sessions no exercise BP needed     Blood Pressure (Exit) 110/70 128/76 114/70     Heart Rate (Admit) 100 bpm 78 bpm 89 bpm     Heart Rate (Exercise) 123 bpm 125 bpm 118 bpm     Heart Rate (Exit) 103 bpm 101 bpm 98 bpm     Rating of Perceived Exertion (Exercise) 11.5 11.5 12     Perceived Dyspnea (Exercise) 0 0 0     Symptoms chronic back pain chronic back pain chronic back pain     Comments REVD MET's and goals REVD MET's REVD MET's and goals     Duration Continue with 30 min of aerobic exercise without signs/symptoms of physical distress. Continue with 30 min of aerobic exercise without signs/symptoms of physical distress. Continue with 30 min of aerobic exercise without signs/symptoms of physical distress.     Intensity THRR unchanged THRR unchanged THRR unchanged       Progression   Progression Continue to progress workloads to maintain intensity without signs/symptoms of physical distress. Continue to progress workloads to maintain intensity without signs/symptoms of physical distress. Continue to progress workloads to maintain intensity without signs/symptoms of physical distress.     Average METs 3.65 3.6 3.55       Resistance Training   Training Prescription Yes Yes Yes     Weight 2 lbs 2 lbs 2 lbs     Reps 10-15 10-15 10-15     Time 10 Minutes 10 Minutes 10 Minutes       Recumbant Bike   Level 3 3 3      RPM 82 87 76     Watts 47 42 48     Minutes 15 15 15      METs 3.7 3.6 3.7       NuStep   Level 2 2 4      SPM 121 117 104     Minutes 15 15 15      METs 3.6 3.6 3.4              Exercise Comments:   Exercise Comments     Row Name 09/09/22 1315 09/23/22 1205 10/02/22 0838 10/23/22  4034 11/06/22 7425   Exercise Comments Pt first day in program, pt was educated on THRR, RPE, and METs. Pt exercised at an avg of 2.2 METs on  the NS and 2.5 METs on the RB. Will increase WL as tolerated. Reviewed MET's and goals. Pt tolerated exercise well with an average MET level of 2.5. Pt feels good about her goals and is increasing strength and stamina Reviewed MET's. Pt tolerated exercise well with an average MET level of 2.5. Pt is doing well, she deals with chronic back pain. She is awaiting MRI results. Will review home exercise soon, just continue to walk as able for now. REVD MET's and goals. Pt tolerated exercise well with an average MET level of 2.6. REVD MET's. Pt tolerated exercise well with an average MET level of 3.3. She is doing very well and is increasing MET's and WL's as tolerated    Row Name 11/15/22 1427 12/02/22 0830 12/16/22 1424       Exercise Comments REVD MET's and goals. Pt tolerated exercise well with an average MET level of 3.65. She continues to deal with chronic back pain, but is doing well and has increased WL's and plasn to move up next session. Sh says she's recieved a lot of good diet info from the RD on heart healthy and DM. She's continuing to work on forming healthy habits. Overall doing very well and progressing MET's REVD MET's. Pt tolerated exercise well with an average MET level of 3.65. She has chronic back pain and recently has been dealing with issues from a colonascopy, but is on the mend now. Will help her increase WL's as ready to help increase MET's REVD MET's and goals. Pt tolerated exercise well with an average MET level of 3.65. Pt is feeling good about her goals and feels much better overall. She is increasing strength, stamina and MET's.              Exercise Goals and Review:   Exercise Goals     Row Name 09/02/22 0804             Exercise Goals   Increase Physical Activity Yes       Intervention Provide advice, education, support and counseling about physical activity/exercise needs.;Develop an individualized exercise prescription for aerobic and resistive training based  on initial evaluation findings, risk stratification, comorbidities and participant's personal goals.       Expected Outcomes Short Term: Attend rehab on a regular basis to increase amount of physical activity.;Long Term: Exercising regularly at least 3-5 days a week.;Long Term: Add in home exercise to make exercise part of routine and to increase amount of physical activity.       Increase Strength and Stamina Yes       Intervention Provide advice, education, support and counseling about physical activity/exercise needs.;Develop an individualized exercise prescription for aerobic and resistive training based on initial evaluation findings, risk stratification, comorbidities and participant's personal goals.       Expected Outcomes Short Term: Increase workloads from initial exercise prescription for resistance, speed, and METs.;Short Term: Perform resistance training exercises routinely during rehab and add in resistance training at home;Long Term: Improve cardiorespiratory fitness, muscular endurance and strength as measured by increased METs and functional capacity ( )       Able to understand and use rate of perceived exertion (RPE) scale Yes       Intervention Provide education and explanation on how to use RPE scale  Expected Outcomes Short Term: Able to use RPE daily in rehab to express subjective intensity level;Long Term:  Able to use RPE to guide intensity level when exercising independently       Knowledge and understanding of Target Heart Rate Range (THRR) Yes       Intervention Provide education and explanation of THRR including how the numbers were predicted and where they are located for reference       Expected Outcomes Short Term: Able to state/look up THRR;Short Term: Able to use daily as guideline for intensity in rehab;Long Term: Able to use THRR to govern intensity when exercising independently       Understanding of Exercise Prescription Yes       Intervention Provide  education, explanation, and written materials on patient's individual exercise prescription       Expected Outcomes Short Term: Able to explain program exercise prescription;Long Term: Able to explain home exercise prescription to exercise independently                Exercise Goals Re-Evaluation :  Exercise Goals Re-Evaluation     Row Name 09/09/22 1310 10/23/22 0822 11/15/22 1425 12/16/22 1421       Exercise Goal Re-Evaluation   Exercise Goals Review Increase Physical Activity;Understanding of Exercise Prescription;Increase Strength and Stamina;Knowledge and understanding of Target Heart Rate Range (THRR);Able to understand and use rate of perceived exertion (RPE) scale Increase Physical Activity;Understanding of Exercise Prescription;Increase Strength and Stamina;Knowledge and understanding of Target Heart Rate Range (THRR);Able to understand and use rate of perceived exertion (RPE) scale Increase Physical Activity;Understanding of Exercise Prescription;Increase Strength and Stamina;Knowledge and understanding of Target Heart Rate Range (THRR);Able to understand and use rate of perceived exertion (RPE) scale Increase Physical Activity;Understanding of Exercise Prescription;Increase Strength and Stamina;Knowledge and understanding of Target Heart Rate Range (THRR);Able to understand and use rate of perceived exertion (RPE) scale    Comments Pt first day in program, pt was educated on THRR, RPE, and METs. Pt exercised at an avg of 2.2 METs on the NS and 2.5 METs on the RB. Pt had to skip weights d/t her BS dropping, pt received glucose gel and other snacks to raise BS. Pt was asymptomatic. REVD MET's and goals. Pt tolerated exercise well with an average MET level of 2.6. She deals with chronic back pain, but is doing well and has increased WL's on both stations and is doing well with the change. She also states she has recieved many resources for ongoing diet information. REVD MET's and goals. Pt  tolerated exercise well with an average MET level of 3.65. She continues to deal with chronic back pain, but is doing well and has increased WL's and plasn to move up next session. Sh says she's recieved a lot of good diet info from the RD on heart healthy and DM. She's continuing to work on forming healthy habits. Overall doing very well and progressing MET's REVD MET's and goals. Pt tolerated exercise well with an average MET level of 3.65. Pt is feeling good about her goals and feels much better overall. She is increasing strength, stamina and MET's.    Expected Outcomes Will increase WL as tolerated barring s/s. Will continue to monitor pt and progress workloads as tolerated without sign or symptom Will continue to monitor pt and progress workloads as tolerated without sign or symptom Will continue to monitor pt and progress workloads as tolerated without sign or symptom  Discharge Exercise Prescription (Final Exercise Prescription Changes):  Exercise Prescription Changes - 12/16/22 1424       Response to Exercise   Blood Pressure (Admit) 132/70    Blood Pressure (Exercise) --   Over 10 sessions no exercise BP needed   Blood Pressure (Exit) 114/70    Heart Rate (Admit) 89 bpm    Heart Rate (Exercise) 118 bpm    Heart Rate (Exit) 98 bpm    Rating of Perceived Exertion (Exercise) 12    Perceived Dyspnea (Exercise) 0    Symptoms chronic back pain    Comments REVD MET's and goals    Duration Continue with 30 min of aerobic exercise without signs/symptoms of physical distress.    Intensity THRR unchanged      Progression   Progression Continue to progress workloads to maintain intensity without signs/symptoms of physical distress.    Average METs 3.55      Resistance Training   Training Prescription Yes    Weight 2 lbs    Reps 10-15    Time 10 Minutes      Recumbant Bike   Level 3    RPM 76    Watts 48    Minutes 15    METs 3.7      NuStep   Level 4    SPM 104     Minutes 15    METs 3.4             Nutrition:  Target Goals: Understanding of nutrition guidelines, daily intake of sodium 1500mg , cholesterol 200mg , calories 30% from fat and 7% or less from saturated fats, daily to have 5 or more servings of fruits and vegetables.  Biometrics:  Pre Biometrics - 09/02/22 0829       Pre Biometrics   Waist Circumference 42.5 inches    Hip Circumference 49 inches    Waist to Hip Ratio 0.87 %    Triceps Skinfold 45 mm    % Body Fat 48.1 %    Grip Strength 22 kg    Flexibility 0 in   pt has chronic back issues, not done   Single Leg Stand 5 seconds              Nutrition Therapy Plan and Nutrition Goals:  Nutrition Therapy & Goals - 12/04/22 1034       Nutrition Therapy   Diet Heart Healthy Diet    Drug/Food Interactions Statins/Certain Fruits      Personal Nutrition Goals   Nutrition Goal Patient to identify strategies for reducing cardiovascular risk by attending the [employer] education and nutrition series weekly.   goal in progress.   Personal Goal #2 Patient to improve diet quality by using the plate method as a guide for meal planning to include lean protein/plant protein, fruits, vegetables, whole grains, nonfat dairy as part of a well-balanced diet.   goal in progress.   Personal Goal #3 Patient to reduce sodium intake to 1500mg  per day   goal in progress   Personal Goal #4 Patient to identify strategies for blood sugar control with goal A1c <7%.   goal in progress. No updated A1c at this time.   Comments Goals in progress. Patient has medical history of CAD, DM2, HTN, CABGx2. Kayla Berger continues to attend the [employer] education and nutrition series as able. She has started making some dietary changes including increased fiber intake, decreased "grazing"/snacking behaviors, and reading food labels for sodium/sugar. She is down 8.6# since starting with our program.  No new labs at this time; most recent A1c remains elevated but  expect continued improvement with dietary changes and weight loss. Patient will benefit from participation in intensive cardiac rehab for nutrition, exercise, and lifestyle modification.      Intervention Plan   Intervention Prescribe, educate and counsel regarding individualized specific dietary modifications aiming towards targeted core components such as weight, hypertension, lipid management, diabetes, heart failure and other comorbidities.;Nutrition handout(s) given to patient.    Expected Outcomes Short Term Goal: Understand basic principles of dietary content, such as calories, fat, sodium, cholesterol and nutrients.;Long Term Goal: Adherence to prescribed nutrition plan.             Nutrition Assessments:  Nutrition Assessments - 09/09/22 0913       Rate Your Plate Scores   Pre Score 63            MEDIFICTS Score Key: >=70 Need to make dietary changes  40-70 Heart Healthy Diet <= 40 Therapeutic Level Cholesterol Diet   Flowsheet Row INTENSIVE CARDIAC REHAB from 09/09/2022 in [facility]  Picture Your Plate Total Score on Admission 63      Picture Your Plate Scores: <16 Unhealthy dietary pattern with much room for improvement. 41-50 Dietary pattern unlikely to meet recommendations for good health and room for improvement. 51-60 More healthful dietary pattern, with some room for improvement.  >60 Healthy dietary pattern, although there may be some specific behaviors that could be improved.    Nutrition Goals Re-Evaluation:  Nutrition Goals Re-Evaluation     Row Name 09/09/22 0904 10/07/22 0948 11/04/22 1119 12/04/22 1034       Goals   Current Weight 196 lb 10.4 oz (89.2 kg) 193 lb 2 oz (87.6 kg) 195 lb 8.8 oz (88.7 kg) 192 lb 0.3 oz (87.1 kg)    Comment A1c 7.0, lipoproteinA 104.5, LDL 71 no new labs; most recent labs  A1c 7.0, lipoproteinA 104.5, LDL 71 no new labs; most recent labs A1c 7.0, lipoproteinA  104.5, LDL 71 no new labs; most recent labs A1c 7.0, lipoproteinA 104.5, LDL 71    Expected Outcome [place] is motivated to improve eating habits, decrease sweets/simple sugar intake, and eat on a regular/consistent schedule. Patient will benefit from participation in intensive cardiac rehab for nutrition, exercise, and lifestyle modification Goals in progress. Kayla Berger has attended 5 [employer] education/nutrition series classes. She has started making some dietary changes including increased fiber intake, decreased "grazing"/snacking behaviors, and reading food labels for sodium. She is down 7.5# since starting with our program. Patient will benefit from participation in intensive cardiac rehab for nutrition, exercise, and lifestyle modification. Goals in progress. Kayla Berger continues to attend the [employer] education and nutrition series as able. She has started making some dietary changes including increased fiber intake, decreased "grazing"/snacking behaviors, and reading food labels for sodium/sugar. She is down 5.1# since starting with our program. LDL and A1c remain above goal. Patient will benefit from participation in intensive cardiac rehab for nutrition, exercise, and lifestyle modificatio Goals in progress. Patient has medical history of CAD, DM2, HTN, CABGx2. Kayla Berger continues to attend the [facility] education and nutrition series as able. She has started making some dietary changes including increased fiber intake, decreased "grazing"/snacking behaviors, and reading food labels for sodium/sugar. She is down 8.6# since starting with our program. No new labs at this time; most recent A1c remains elevated but expect continued improvement with dietary changes and weight loss. Patient will  benefit from participation in intensive cardiac rehab for nutrition, exercise, and lifestyle modification.             Nutrition Goals Re-Evaluation:  Nutrition Goals Re-Evaluation     Row Name 09/09/22 0904 10/07/22  0948 11/04/22 1119 12/04/22 1034       Goals   Current Weight 196 lb 10.4 oz (89.2 kg) 193 lb 2 oz (87.6 kg) 195 lb 8.8 oz (88.7 kg) 192 lb 0.3 oz (87.1 kg)    Comment A1c 7.0, lipoproteinA 104.5, LDL 71 no new labs; most recent labs  A1c 7.0, lipoproteinA 104.5, LDL 71 no new labs; most recent labs A1c 7.0, lipoproteinA 104.5, LDL 71 no new labs; most recent labs A1c 7.0, lipoproteinA 104.5, LDL 71    Expected Outcome Matasha is motivated to improve eating habits, decrease sweets/simple sugar intake, and eat on a regular/consistent schedule. Patient will benefit from participation in intensive cardiac rehab for nutrition, exercise, and lifestyle modification Goals in progress. Akiba has attended 5 [employer] education/nutrition series classes. She has started making some dietary changes including increased fiber intake, decreased "grazing"/snacking behaviors, and reading food labels for sodium. She is down 7.5# since starting with our program. Patient will benefit from participation in intensive cardiac rehab for nutrition, exercise, and lifestyle modification. Goals in progress. Gearldean continues to attend the [employer] education and nutrition series as able. She has started making some dietary changes including increased fiber intake, decreased "grazing"/snacking behaviors, and reading food labels for sodium/sugar. She is down 5.1# since starting with our program. LDL and A1c remain above goal. Patient will benefit from participation in intensive cardiac rehab for nutrition, exercise, and lifestyle modificatio Goals in progress. Patient has medical history of CAD, DM2, HTN, CABGx2. Alexius continues to attend the [employer] education and nutrition series as able. She has started making some dietary changes including increased fiber intake, decreased "grazing"/snacking behaviors, and reading food labels for sodium/sugar. She is down 8.6# since starting with our program. No new labs at this time; most recent A1c  remains elevated but expect continued improvement with dietary changes and weight loss. Patient will benefit from participation in intensive cardiac rehab for nutrition, exercise, and lifestyle modification.             Nutrition Goals Discharge (Final Nutrition Goals Re-Evaluation):  Nutrition Goals Re-Evaluation - 12/04/22 1034       Goals   Current Weight 192 lb 0.3 oz (87.1 kg)    Comment no new labs; most recent labs A1c 7.0, lipoproteinA 104.5, LDL 71    Expected Outcome Goals in progress. Patient has medical history of CAD, DM2, HTN, CABGx2. Kayla Berger continues to attend the [employer] education and nutrition series as able. She has started making some dietary changes including increased fiber intake, decreased "grazing"/snacking behaviors, and reading food labels for sodium/sugar. She is down 8.6# since starting with our program. No new labs at this time; most recent A1c remains elevated but expect continued improvement with dietary changes and weight loss. Patient will benefit from participation in intensive cardiac rehab for nutrition, exercise, and lifestyle modification.             Psychosocial: Target Goals: Acknowledge presence or absence of significant depression and/or stress, maximize coping skills, provide positive support system. Participant is able to verbalize types and ability to use techniques and skills needed for reducing stress and depression.  Initial Review & Psychosocial Screening:  Initial Psych Review & Screening - 09/02/22 1308  Initial Review   Current issues with Current Anxiety/Panic;Current Psychotropic Meds;Current Stress Concerns;Current Depression    Source of Stress Concerns Chronic Illness;Financial    Comments Kayla Berger shared that she has felt some low levels of depression since her CAD diagnosis and post surgery. She has struggled to adjust to the lifestyle changes and learning what she can control vs. not control. She is taking Cymbalta and  feels it is helping, does not feel she needs additional support. Kayla Berger is also very unhappy with her physical appearance.      Family Dynamics   Good Support System? Yes   Jaylena has her sisters and daughter for support     Barriers   Psychosocial barriers to participate in program The patient should benefit from training in stress management and relaxation.      Screening Interventions   Interventions Encouraged to exercise;Provide feedback about the scores to participant;To provide support and resources with identified psychosocial needs    Expected Outcomes Long Term Goal: Stressors or current issues are controlled or eliminated.;Short Term goal: Identification and review with participant of any Quality of Life or Depression concerns found by scoring the questionnaire.;Long Term goal: The participant improves quality of Life and PHQ9 Scores as seen by post scores and/or verbalization of changes             Quality of Life Scores:  Quality of Life - 09/02/22 1214       Quality of Life   Select Quality of Life      Quality of Life Scores   Health/Function Pre 18.67 %    Socioeconomic Pre 25 %    Psych/Spiritual Pre 18.5 %    Family Pre 24.5 %    GLOBAL Pre 20.79 %            Scores of 19 and below usually indicate a poorer quality of life in these areas.  A difference of  2-3 points is a clinically meaningful difference.  A difference of 2-3 points in the total score of the Quality of Life Index has been associated with significant improvement in overall quality of life, self-image, physical symptoms, and general health in studies assessing change in quality of life.  PHQ-9: Review Flowsheet       09/02/2022  Depression screen PHQ 2/9  Decreased Interest 0  Down, Depressed, Hopeless 1  PHQ - 2 Score 1  Altered sleeping 1  Tired, decreased energy 1  Change in appetite 1  Feeling bad or failure about yourself  1  Trouble concentrating 0  Moving slowly or  fidgety/restless 0  Suicidal thoughts 0  PHQ-9 Score 5  Difficult doing work/chores Somewhat difficult    Details           Interpretation of Total Score  Total Score Depression Severity:  1-4 = Minimal depression, 5-9 = Mild depression, 10-14 = Moderate depression, 15-19 = Moderately severe depression, 20-27 = Severe depression   Psychosocial Evaluation and Intervention:   Psychosocial Re-Evaluation:  Psychosocial Re-Evaluation     Row Name 09/20/22 [phone] 10/22/22 9528 11/14/22 1036 12/16/22 1722       Psychosocial Re-Evaluation   Current issues with Current Abuse or Neglect to Report;Current Stress Concerns;Current Anxiety/Panic;Current Psychotropic Meds Current Stress Concerns;Current Anxiety/Panic;Current Psychotropic Meds Current Stress Concerns;Current Anxiety/Panic;Current Psychotropic Meds Current Stress Concerns;Current Anxiety/Panic;Current Psychotropic Meds    Comments Quality of life questionnarie reviewed on 09/11/22. Kiondra has good family support and has returned to work Kayla Berger has not voiced any increased concerns  or stressors during exercise at cardiac rehab. Kayla Berger continues not to  voice any increased concerns or stressors during exercise at cardiac rehab. Kayla Berger continues not to  voice any increased concerns or stressors during exercise at cardiac rehab. Kayla Berger will complete cardiac rehab on 12/20/22    Expected Outcomes Kayla Berger will have decreased concerns/ stressors upon completion of cardiac rehab Kayla Berger will have decreased concerns/ stressors upon completion of cardiac rehab Kayla Berger will have decreased concerns/ stressors upon completion of cardiac rehab Kayla Berger will have decreased concerns/ stressors upon completion of cardiac rehab    Interventions Stress management education;Relaxation education;Encouraged to attend Cardiac Rehabilitation for the exercise Stress management education;Relaxation education;Encouraged to attend Cardiac Rehabilitation for the  exercise Stress management education;Relaxation education;Encouraged to attend Cardiac Rehabilitation for the exercise Stress management education;Relaxation education;Encouraged to attend Cardiac Rehabilitation for the exercise    Continue Psychosocial Services  Follow up required by staff No Follow up required No Follow up required No Follow up required      Initial Review   Source of Stress Concerns Chronic Illness;Unable to perform yard/household activities;Unable to participate in former interests or hobbies Chronic Illness;Unable to perform yard/household activities;Unable to participate in former interests or hobbies Chronic Illness;Unable to perform yard/household activities;Unable to participate in former interests or hobbies Chronic Illness;Unable to perform yard/household activities;Unable to participate in former interests or hobbies    Comments Will continue to monitor and offer support as needed Will continue to monitor and offer support as needed Will continue to monitor and offer support as needed Will continue to monitor and offer support as needed             Psychosocial Discharge (Final Psychosocial Re-Evaluation):  Psychosocial Re-Evaluation - 12/16/22 1722       Psychosocial Re-Evaluation   Current issues with Current Stress Concerns;Current Anxiety/Panic;Current Psychotropic Meds    Comments Kayla Berger continues not to  voice any increased concerns or stressors during exercise at cardiac rehab. Kayla Berger will complete cardiac rehab on 12/20/22    Expected Outcomes Kayla Berger will have decreased concerns/ stressors upon completion of cardiac rehab    Interventions Stress management education;Relaxation education;Encouraged to attend Cardiac Rehabilitation for the exercise    Continue Psychosocial Services  No Follow up required      Initial Review   Source of Stress Concerns Chronic Illness;Unable to perform yard/household activities;Unable to participate in former interests or  hobbies    Comments Will continue to monitor and offer support as needed             Vocational Rehabilitation: Provide vocational rehab assistance to qualifying candidates.   Vocational Rehab Evaluation & Intervention:  Vocational Rehab - 09/02/22 0840       Initial Vocational Rehab Evaluation & Intervention   Assessment shows need for Vocational Rehabilitation No   Aliza works for [employer]            Education: Education Goals: Education classes will be provided on a weekly basis, covering required topics. Participant will state understanding/return demonstration of topics presented.    Education     Row Name 09/11/22 1000     Education   Cardiac Education Topics P[facility]ikin   Secondary school teacher School   Educator Dietitian   Weekly Topic Powerhouse Plant-Based Proteins   Instruction Review Code 1- Verbalizes Understanding   Class Start Time 0815   Class Stop Time 0856   Class Time Calculation (min) 41 min    Row Name 09/13/22  0900     Education   Cardiac Education Topics [facility]in   Hospital doctor Education   General Education Hypertension and Heart Disease   Instruction Review Code 1- Verbalizes Understanding   Class Start Time [phone]   Class Stop Time 0850   Class Time Calculation (min) 40 min    Row Name 09/23/22 1000     Education   Cardiac Education Topics Pritikin   Select Core Videos     Core Videos   Educator Dietitian   Select Nutrition   Nutrition Overview of the [facility]in Eating Plan   Instruction Review Code 1- Verbalizes Understanding   Class Start Time 0815   Class Stop Time 0850   Class Time Calculation (min) 35 min    Row Name 09/25/22 1100     Education   Cardiac Education Topics Pritikin   Orthoptist   Educator Dietitian   Weekly Topic Fast and Healthy Breakfasts   Instruction Review Code 1-  Verbalizes Understanding   Class Start Time 0815   Class Stop Time 0847   Class Time Calculation (min) 32 min    Row Name 10/04/22 0900     Education   Cardiac Education Topics Pritikin   Select Workshops     Workshops   Educator Exercise Physiologist   Select Exercise   Exercise Workshop Location manager and Fall Prevention   Instruction Review Code 1- Verbalizes Understanding   Class Start Time [phone]   Class Stop Time 0856   Class Time Calculation (min) 45 min    Row Name 10/07/22 0900     Education   Cardiac Education Topics Pritikin   Glass blower/designer Nutrition   Nutrition Workshop Label Reading   Instruction Review Code 1- Verbalizes Understanding   Class Start Time 0815   Class Stop Time 0904   Class Time Calculation (min) 49 min    Row Name 10/09/22 1100     Education   Cardiac Education Topics P[facility]ikin   Customer service manager   Weekly Topic [employer] Desserts   Instruction Review Code 1- Verbalizes Understanding   Class Start Time 0820   Class Stop Time 0900   Class Time Calculation (min) 40 min    Row Name 10/11/22 1000     Education   Cardiac Education Topics Pritikin   Nurse, learning disability Nutrition   Nutrition Other  Label Reading   Instruction Review Code 1- Verbalizes Understanding   Class Start Time 0815   Class Stop Time 0900   Class Time Calculation (min) 45 min    Row Name 10/21/22 0900     Education   Cardiac Education Topics Pritikin   Select Workshops     Workshops   Educator Exercise Physiologist   Select Exercise   Exercise Workshop Exercise Basics: Building Your Action Plan   Instruction Review Code 1- Verbalizes Understanding   Class Start Time 0813   Class Stop Time 0900   Class Time Calculation (min) 47 min    Row Name 10/25/22 0800     Education   Cardiac Education Topics Pritikin    Nurse, children's  Exercise Physiologist   Select Exercise Education   Exercise Education Move It!   Instruction Review Code 1- Verbalizes Understanding   Class Start Time [phone]   Class Stop Time 0845   Class Time Calculation (min) 33 min    Row Name 10/28/22 1000     Education   Cardiac Education Topics Pritikin   Glass blower/designer Nutrition   Nutrition Workshop Targeting Your Nutrition Priorities   Instruction Review Code 1- Verbalizes Understanding   Class Start Time 0815   Class Stop Time 0900   Class Time Calculation (min) 45 min     Secondary school teacher    Row Name 10/30/22 0900     Education   Cardiac Education Topics Pritikin   Secondary school teacher School   Educator Dietitian   Weekly Topic One-Pot Wonders   Instruction Review Code 1- Verbalizes Understanding   Class Start Time 0815   Class Stop Time 0855   Class Time Calculation (min) 40 min    Row Name 11/13/22 0800     Education   Cardiac Education Topics Pritikin   Select Core Videos     Core Videos   Educator Exercise Physiologist   Select Nutrition   Nutrition Vitamins and Minerals   Instruction Review Code 1- Verbalizes Understanding   Class Start Time [phone]   Class Stop Time 0902   Class Time Calculation (min) 48 min    Row Name 11/15/22 0900     Education   Cardiac Education Topics Pritikin   Orthoptist   Educator Dietitian   Weekly Topic Fast Evening Meals   Instruction Review Code 1- Verbalizes Understanding   Class Start Time 0815   Class Stop Time 0850   Class Time Calculation (min) 35 min    Row Name 11/20/22 0900     Education   Cardiac Education Topics Pritikin   Customer service manager   Weekly Topic International Cuisine- Spotlight on the [facility] Zones   Instruction Review Code 1- Verbalizes  Understanding   Class Start Time 0815   Class Stop Time [phone]   Class Time Calculation (min) 37 min    Row Name 11/27/22 0900     Education   Cardiac Education Topics Pritikin   Orthoptist   Educator Dietitian   Weekly Topic Simple Sides and Sauces   Instruction Review Code 1- Verbalizes Understanding   Class Start Time 0815   Class Stop Time 0900   Class Time Calculation (min) 45 min    Row Name 12/02/22 0900     Education   Cardiac Education Topics Pritikin   Select Workshops     Workshops   Educator Exercise Physiologist   Select Exercise   Exercise Workshop Managing Heart Disease: Your Path to a Healthier Heart   Instruction Review Code 1- Verbalizes Understanding   Class Start Time [phone]   Class Stop Time 0848   Class Time Calculation (min) 37 min    Row Name 12/04/22 0900     Education   Cardiac Education Topics Pritikin   Customer service manager   Weekly Topic Powerhouse Plant-Based Proteins   Instruction Review Code 1- Verbalizes Understanding   Class Start  Time 0815   Class Stop Time 0858   Class Time Calculation (min) 43 min    Row Name 12/06/22 0800     Education   Cardiac Education Topics Pritikin   Select Core Videos     Core Videos   Educator Exercise Physiologist   Select General Education   General Education Hypertension and Heart Disease   Instruction Review Code 1- Verbalizes Understanding   Class Start Time 0815   Class Stop Time 0903   Class Time Calculation (min) 48 min    Row Name 12/16/22 1000     Education   Cardiac Education Topics Pritikin   Select Workshops     Workshops   Educator Exercise Physiologist   Select Exercise   Exercise Workshop Location manager and Fall Prevention   Instruction Review Code 1- Verbalizes Understanding   Class Start Time 0820   Class Stop Time 0905   Class Time Calculation (min) 45 min            Core  Videos: Exercise    Move It!  Clinical staff conducted group or individual video education with verbal and written material and guidebook.  Patient learns the recommended Pritikin exercise program. Exercise with the goal of living a long, healthy life. Some of the health benefits of exercise include controlled diabetes, healthier blood pressure levels, improved cholesterol levels, improved heart and lung capacity, improved sleep, and better body composition. Everyone should speak with their doctor before starting or changing an exercise routine.  Biomechanical Limitations Clinical staff conducted group or individual video education with verbal and written material and guidebook.  Patient learns how biomechanical limitations can impact exercise and how we can mitigate and possibly overcome limitations to have an impactful and balanced exercise routine.  Body Composition Clinical staff conducted group or individual video education with verbal and written material and guidebook.  Patient learns that body composition (ratio of muscle mass to fat mass) is a key component to assessing overall fitness, rather than body weight alone. Increased fat mass, especially visceral belly fat, can put [place] at increased risk for metabolic syndrome, type 2 diabetes, heart disease, and even death. It is recommended to combine diet and exercise (cardiovascular and resistance training) to improve your body composition. Seek guidance from your physician and exercise physiologist before implementing an exercise routine.  Exercise Action Plan Clinical staff conducted group or individual video education with verbal and written material and guidebook.  Patient learns the recommended strategies to achieve and enjoy long-term exercise adherence, including variety, self-motivation, self-efficacy, and positive decision making. Benefits of exercise include fitness, good health, weight management, more energy, better sleep, less  stress, and overall well-being.  Medical   Heart Disease Risk Reduction Clinical staff conducted group or individual video education with verbal and written material and guidebook.  Patient learns our heart is our most vital organ as it circulates oxygen, nutrients, white blood cells, and hormones throughout the entire body, and carries waste away. Data supports a plant-based eating plan like the Pritikin Program for its effectiveness in slowing progression of and reversing heart disease. The video provides a number of recommendations to address heart disease.   Metabolic Syndrome and Belly Fat  Clinical staff conducted group or individual video education with verbal and written material and guidebook.  Patient learns what metabolic syndrome is, how it leads to heart disease, and how one can reverse it and keep it from coming back. You have metabolic syndrome if you have 3 of  the following 5 criteria: abdominal obesity, high blood pressure, high triglycerides, low HDL cholesterol, and high blood sugar.  Hypertension and Heart Disease Clinical staff conducted group or individual video education with verbal and written material and guidebook.  Patient learns that high blood pressure, or hypertension, is very common in the [place]. Hypertension is largely due to excessive salt intake, but other important risk factors include being overweight, physical inactivity, drinking too much alcohol, smoking, and not eating enough potassium from fruits and vegetables. High blood pressure is a leading risk factor for heart attack, stroke, congestive heart failure, dementia, kidney failure, and premature death. Long-term effects of excessive salt intake include stiffening of the arteries and thickening of heart muscle and organ damage. Recommendations include ways to reduce hypertension and the risk of heart disease.  Diseases of Our Time - Focusing on Diabetes Clinical staff conducted group or individual  video education with verbal and written material and guidebook.  Patient learns why the best way to stop diseases of our time is prevention, through food and other lifestyle changes. Medicine (such as prescription pills and surgeries) is often only a Band-Aid on the problem, not a long-term solution. Most common diseases of our time include obesity, type 2 diabetes, hypertension, heart disease, and cancer. The Pritikin Program is recommended and has been proven to help reduce, reverse, and/or prevent the damaging effects of metabolic syndrome.  Nutrition   Overview of the Pritikin Eating Plan  Clinical staff conducted group or individual video education with verbal and written material and guidebook.  Patient learns about the Pritikin Eating Plan for disease risk reduction. The Pritikin Eating Plan emphasizes a wide variety of unrefined, minimally-processed carbohydrates, like fruits, vegetables, whole grains, and legumes. Go, Caution, and Stop food choices are explained. Plant-based and lean animal proteins are emphasized. Rationale provided for low sodium intake for blood pressure control, low added sugars for blood sugar stabilization, and low added fats and oils for coronary artery disease risk reduction and weight management.  Calorie Density  Clinical staff conducted group or individual video education with verbal and written material and guidebook.  Patient learns about calorie density and how it impacts the Pritikin Eating Plan. Knowing the characteristics of the food you choose will help you decide whether those foods will lead to weight gain or weight loss, and whether you want to consume more or less of them. Weight loss is usually a side effect of the Pritikin Eating Plan because of its focus on low calorie-dense foods.  Label Reading  Clinical staff conducted group or individual video education with verbal and written material and guidebook.  Patient learns about the Pritikin recommended  label reading guidelines and corresponding recommendations regarding calorie density, added sugars, sodium content, and whole grains.  Dining Out - Part 1  Clinical staff conducted group or individual video education with verbal and written material and guidebook.  Patient learns that restaurant meals can be sabotaging because they can be so high in calories, fat, sodium, and/or sugar. Patient learns recommended strategies on how to positively address this and avoid unhealthy pitfalls.  Facts on Fats  Clinical staff conducted group or individual video education with verbal and written material and guidebook.  Patient learns that lifestyle modifications can be just as effective, if not more so, as many medications for lowering your risk of heart disease. A Pritikin lifestyle can help to reduce your risk of inflammation and atherosclerosis (cholesterol build-up, or plaque, in the artery walls). Lifestyle interventions such  as dietary choices and physical activity address the cause of atherosclerosis. A review of the types of fats and their impact on blood cholesterol levels, along with dietary recommendations to reduce fat intake is also included.  Nutrition Action Plan  Clinical staff conducted group or individual video education with verbal and written material and guidebook.  Patient learns how to incorporate Pritikin recommendations into their lifestyle. Recommendations include planning and keeping personal health goals in mind as an important part of their success.  Healthy Mind-Set    Healthy Minds, Bodies, Hearts  Clinical staff conducted group or individual video education with verbal and written material and guidebook.  Patient learns how to identify when they are stressed. Video will discuss the impact of that stress, as well as the many benefits of stress management. Patient will also be introduced to stress management techniques. The way we think, act, and feel has an impact on our  hearts.  How Our Thoughts Can Heal Our Hearts  Clinical staff conducted group or individual video education with verbal and written material and guidebook.  Patient learns that negative thoughts can cause depression and anxiety. This can result in negative lifestyle behavior and serious health problems. Cognitive behavioral therapy is an effective method to help control our thoughts in order to change and improve our emotional outlook.  Additional Videos:  Exercise    Improving Performance  Clinical staff conducted group or individual video education with verbal and written material and guidebook.  Patient learns to use a non-linear approach by alternating intensity levels and lengths of time spent exercising to help burn more calories and lose more body fat. Cardiovascular exercise helps improve heart health, metabolism, hormonal balance, blood sugar control, and recovery from fatigue. Resistance training improves strength, endurance, balance, coordination, reaction time, metabolism, and muscle mass. Flexibility exercise improves circulation, posture, and balance. Seek guidance from your physician and exercise physiologist before implementing an exercise routine and learn your capabilities and proper form for all exercise.  Introduction to Yoga  Clinical staff conducted group or individual video education with verbal and written material and guidebook.  Patient learns about yoga, a discipline of the coming together of mind, breath, and body. The benefits of yoga include improved flexibility, improved range of motion, better posture and core strength, increased lung function, weight loss, and positive self-image. Yoga's heart health benefits include lowered blood pressure, healthier heart rate, decreased cholesterol and triglyceride levels, improved immune function, and reduced stress. Seek guidance from your physician and exercise physiologist before implementing an exercise routine and learn your  capabilities and proper form for all exercise.  Medical   Aging: Enhancing Your Quality of Life  Clinical staff conducted group or individual video education with verbal and written material and guidebook.  Patient learns key strategies and recommendations to stay in good physical health and enhance quality of life, such as prevention strategies, having an advocate, securing a Health Care Proxy and Power of Attorney, and keeping a list of medications and system for tracking them. It also discusses how to avoid risk for bone loss.  Biology of Weight Control  Clinical staff conducted group or individual video education with verbal and written material and guidebook.  Patient learns that weight gain occurs because we consume more calories than we burn (eating more, moving less). Even if your body weight is normal, you may have higher ratios of fat compared to muscle mass. Too much body fat puts you at increased risk for cardiovascular disease, heart attack, stroke,  type 2 diabetes, and obesity-related cancers. In addition to exercise, following the Pritikin Eating Plan can help reduce your risk.  Decoding Lab Results  Clinical staff conducted group or individual video education with verbal and written material and guidebook.  Patient learns that lab test reflects one measurement whose values change over time and are influenced by many factors, including medication, stress, sleep, exercise, food, hydration, pre-existing medical conditions, and more. It is recommended to use the knowledge from this video to become more involved with your lab results and evaluate your numbers to speak with your doctor.   Diseases of Our Time - Overview  Clinical staff conducted group or individual video education with verbal and written material and guidebook.  Patient learns that according to the CDC, 50% to 70% of chronic diseases (such as obesity, type 2 diabetes, elevated lipids, hypertension, and heart disease) are  avoidable through lifestyle improvements including healthier food choices, listening to satiety cues, and increased physical activity.  Sleep Disorders Clinical staff conducted group or individual video education with verbal and written material and guidebook.  Patient learns how good quality and duration of sleep are important to overall health and well-being. Patient also learns about sleep disorders and how they impact health along with recommendations to address them, including discussing with a physician.  Nutrition  Dining Out - Part 2 Clinical staff conducted group or individual video education with verbal and written material and guidebook.  Patient learns how to plan ahead and communicate in order to maximize their dining experience in a healthy and nutritious manner. Included are recommended food choices based on the type of restaurant the patient is visiting.   Fueling a Banker conducted group or individual video education with verbal and written material and guidebook.  There is a strong connection between our food choices and our health. Diseases like obesity and type 2 diabetes are very prevalent and are in large-part due to lifestyle choices. The Pritikin Eating Plan provides plenty of food and hunger-curbing satisfaction. It is easy to follow, affordable, and helps reduce health risks.  Menu Workshop  Clinical staff conducted group or individual video education with verbal and written material and guidebook.  Patient learns that restaurant meals can sabotage health goals because they are often packed with calories, fat, sodium, and sugar. Recommendations include strategies to plan ahead and to communicate with the manager, chef, or server to help order a healthier meal.  Planning Your Eating Strategy  Clinical staff conducted group or individual video education with verbal and written material and guidebook.  Patient learns about the Pritikin Eating Plan and  its benefit of reducing the risk of disease. The Pritikin Eating Plan does not focus on calories. Instead, it emphasizes high-quality, nutrient-rich foods. By knowing the characteristics of the foods, we choose, we can determine their calorie density and make informed decisions.  Targeting Your Nutrition Priorities  Clinical staff conducted group or individual video education with verbal and written material and guidebook.  Patient learns that lifestyle habits have a tremendous impact on disease risk and progression. This video provides eating and physical activity recommendations based on your personal health goals, such as reducing LDL cholesterol, losing weight, preventing or controlling type 2 diabetes, and reducing high blood pressure.  Vitamins and Minerals  Clinical staff conducted group or individual video education with verbal and written material and guidebook.  Patient learns different ways to obtain key vitamins and minerals, including through a recommended healthy diet. It is  important to discuss all supplements you take with your doctor.   Healthy Mind-Set    Smoking Cessation  Clinical staff conducted group or individual video education with verbal and written material and guidebook.  Patient learns that cigarette smoking and tobacco addiction pose a serious health risk which affects millions of people. Stopping smoking will significantly reduce the risk of heart disease, lung disease, and many forms of cancer. Recommended strategies for quitting are covered, including working with your doctor to develop a successful plan.  Culinary   Becoming a Set designer conducted group or individual video education with verbal and written material and guidebook.  Patient learns that cooking at home can be healthy, cost-effective, quick, and puts them in control. Keys to cooking healthy recipes will include looking at your recipe, assessing your equipment needs, planning ahead,  making it simple, choosing cost-effective seasonal ingredients, and limiting the use of added fats, salts, and sugars.  Cooking - Breakfast and Snacks  Clinical staff conducted group or individual video education with verbal and written material and guidebook.  Patient learns how important breakfast is to satiety and nutrition through the entire day. Recommendations include key foods to eat during breakfast to help stabilize blood sugar levels and to prevent overeating at meals later in the day. Planning ahead is also a key component.  Cooking - Educational psychologist conducted group or individual video education with verbal and written material and guidebook.  Patient learns eating strategies to improve overall health, including an approach to cook more at home. Recommendations include thinking of animal protein as a side on your plate rather than center stage and focusing instead on lower calorie dense options like vegetables, fruits, whole grains, and plant-based proteins, such as beans. Making sauces in large quantities to freeze for later and leaving the skin on your vegetables are also recommended to maximize your experience.  Cooking - Healthy Salads and Dressing Clinical staff conducted group or individual video education with verbal and written material and guidebook.  Patient learns that vegetables, fruits, whole grains, and legumes are the foundations of the Pritikin Eating Plan. Recommendations include how to incorporate each of these in flavorful and healthy salads, and how to create homemade salad dressings. Proper handling of ingredients is also covered. Cooking - Soups and [employer] - Soups and Desserts Clinical staff conducted group or individual video education with verbal and written material and guidebook.  Patient learns that Pritikin soups and desserts make for easy, nutritious, and delicious snacks and meal components that are low in sodium, fat, sugar, and  calorie density, while high in vitamins, minerals, and filling fiber. Recommendations include simple and healthy ideas for soups and desserts.   Overview     The Pritikin Solution Program Overview Clinical staff conducted group or individual video education with verbal and written material and guidebook.  Patient learns that the results of the Pritikin Program have been documented in more than 100 articles published in peer-reviewed journals, and the benefits include reducing risk factors for (and, in some cases, even reversing) high cholesterol, high blood pressure, type 2 diabetes, obesity, and more! An overview of the three key pillars of the Pritikin Program will be covered: eating well, doing regular exercise, and having a healthy mind-set.  WORKSHOPS  Exercise: Exercise Basics: Building Your Action Plan Clinical staff led group instruction and group discussion with PowerPoint presentation and patient guidebook. To enhance the learning environment the use of posters, models  and videos may be added. At the conclusion of this workshop, patients will comprehend the difference between physical activity and exercise, as well as the benefits of incorporating both, into their routine. Patients will understand the FITT (Frequency, Intensity, Time, and Type) principle and how to use it to build an exercise action plan. In addition, safety concerns and other considerations for exercise and cardiac rehab will be addressed by the presenter. The purpose of this lesson is to promote a comprehensive and effective weekly exercise routine in order to improve patients' overall level of fitness.   Managing Heart Disease: Your Path to a Healthier Heart Clinical staff led group instruction and group discussion with PowerPoint presentation and patient guidebook. To enhance the learning environment the use of posters, models and videos may be added.At the conclusion of this workshop, patients will understand the  anatomy and physiology of the heart. Additionally, they will understand how Pritikin's three pillars impact the risk factors, the progression, and the management of heart disease.  The purpose of this lesson is to provide a high-level overview of the heart, heart disease, and how the Pritikin lifestyle positively impacts risk factors.  Exercise Biomechanics Clinical staff led group instruction and group discussion with PowerPoint presentation and patient guidebook. To enhance the learning environment the use of posters, models and videos may be added. Patients will learn how the structural parts of their bodies function and how these functions impact their daily activities, movement, and exercise. Patients will learn how to promote a neutral spine, learn how to manage pain, and identify ways to improve their physical movement in order to promote healthy living. The purpose of this lesson is to expose patients to common physical limitations that impact physical activity. Participants will learn practical ways to adapt and manage aches and pains, and to minimize their effect on regular exercise. Patients will learn how to maintain good posture while sitting, walking, and lifting.  Balance Training and Fall Prevention  Clinical staff led group instruction and group discussion with PowerPoint presentation and patient guidebook. To enhance the learning environment the use of posters, models and videos may be added. At the conclusion of this workshop, patients will understand the importance of their sensorimotor skills (vision, proprioception, and the vestibular system) in maintaining their ability to balance as they age. Patients will apply a variety of balancing exercises that are appropriate for their current level of function. Patients will understand the common causes for poor balance, possible solutions to these problems, and ways to modify their physical environment in order to minimize their  fall risk. The purpose of this lesson is to teach patients about the importance of maintaining balance as they age and ways to minimize their risk of falling.  WORKSHOPS   Nutrition:  Fueling a Ship broker led group instruction and group discussion with PowerPoint presentation and patient guidebook. To enhance the learning environment the use of posters, models and videos may be added. Patients will review the foundational principles of the Pritikin Eating Plan and understand what constitutes a serving size in each of the food groups. Patients will also learn Pritikin-friendly foods that are better choices when away from home and review make-ahead meal and snack options. Calorie density will be reviewed and applied to three nutrition priorities: weight maintenance, weight loss, and weight gain. The purpose of this lesson is to reinforce (in a group setting) the key concepts around what patients are recommended to eat and how to apply these guidelines when away  from home by planning and selecting Pritikin-friendly options. Patients will understand how calorie density may be adjusted for different weight management goals.  Mindful Eating  Clinical staff led group instruction and group discussion with PowerPoint presentation and patient guidebook. To enhance the learning environment the use of posters, models and videos may be added. Patients will briefly review the concepts of the Pritikin Eating Plan and the importance of low-calorie dense foods. The concept of mindful eating will be introduced as well as the importance of paying attention to internal hunger signals. Triggers for non-hunger eating and techniques for dealing with triggers will be explored. The purpose of this lesson is to provide patients with the opportunity to review the basic principles of the Pritikin Eating Plan, discuss the value of eating mindfully and how to measure internal cues of hunger and fullness using the  Hunger Scale. Patients will also discuss reasons for non-hunger eating and learn strategies to use for controlling emotional eating.  Targeting Your Nutrition Priorities Clinical staff led group instruction and group discussion with PowerPoint presentation and patient guidebook. To enhance the learning environment the use of posters, models and videos may be added. Patients will learn how to determine their genetic susceptibility to disease by reviewing their family history. Patients will gain insight into the importance of diet as part of an overall healthy lifestyle in mitigating the impact of genetics and other environmental insults. The purpose of this lesson is to provide patients with the opportunity to assess their personal nutrition priorities by looking at their family history, their own health history and current risk factors. Patients will also be able to discuss ways of prioritizing and modifying the Pritikin Eating Plan for their highest risk areas  Menu  Clinical staff led group instruction and group discussion with PowerPoint presentation and patient guidebook. To enhance the learning environment the use of posters, models and videos may be added. Using menus brought in from [employer], or printed from [employer], patients will apply the Pritikin dining out guidelines that were presented in the [employer] video. Patients will also be able to practice these guidelines in a variety of provided scenarios. The purpose of this lesson is to provide patients with the opportunity to practice hands-on learning of the Pritikin Dining Out guidelines with actual menus and practice scenarios.  Label Reading Clinical staff led group instruction and group discussion with PowerPoint presentation and patient guidebook. To enhance the learning environment the use of posters, models and videos may be added. Patients will review and discuss the Pritikin label reading guidelines  presented in Pritikin's Label Reading Educational series video. Using fool labels brought in from local grocery stores and markets, patients will apply the label reading guidelines and determine if the packaged food meet the Pritikin guidelines. The purpose of this lesson is to provide patients with the opportunity to review, discuss, and practice hands-on learning of the Pritikin Label Reading guidelines with actual packaged food labels. Cooking School  Pritikin's [employer] are designed to teach patients ways to prepare quick, simple, and affordable recipes at home. The importance of nutrition's role in chronic disease risk reduction is reflected in its emphasis in the overall Pritikin program. By learning how to prepare essential core Pritikin Eating Plan recipes, patients will increase control over what they eat; be able to customize the flavor of foods without the use of added salt, sugar, or fat; and improve the quality of the food they consume. By learning a set  of core recipes which are easily assembled, quickly prepared, and affordable, patients are more likely to prepare more healthy foods at home. These workshops focus on convenient breakfasts, simple entres, side dishes, and desserts which can be prepared with minimal effort and are consistent with nutrition recommendations for cardiovascular risk reduction. Cooking [employer] are taught by a Armed forces logistics/support/administrative officer (RD) who has been trained by the [employer]. The chef or RD has a clear understanding of the importance of minimizing - if not completely eliminating - added fat, sugar, and sodium in recipes. Throughout the series of Cooking School Workshop sessions, patients will learn about healthy ingredients and efficient methods of cooking to build confidence in their capability to prepare    Cooking School weekly topics:  Adding Flavor- Sodium-Free  Fast and Healthy Breakfasts  Powerhouse Plant-Based  Proteins  Satisfying Salads and Dressings  Simple Sides and Sauces  International Cuisine-Spotlight on the [employer] Zones  Delicious Desserts  Savory Soups  [employer] - Meals in a Astronomer Appetizers and Snacks  Comforting Weekend Breakfasts  One-Pot Wonders   Fast Evening Meals  Landscape architect Your Pritikin Plate  WORKSHOPS   Healthy Mindset (Psychosocial):  Focused Goals, Sustainable Changes Clinical staff led group instruction and group discussion with PowerPoint presentation and patient guidebook. To enhance the learning environment the use of posters, models and videos may be added. Patients will be able to apply effective goal setting strategies to establish at least one personal goal, and then take consistent, meaningful action toward that goal. They will learn to identify common barriers to achieving personal goals and develop strategies to overcome them. Patients will also gain an understanding of how our mind-set can impact our ability to achieve goals and the importance of cultivating a positive and growth-oriented mind-set. The purpose of this lesson is to provide patients with a deeper understanding of how to set and achieve personal goals, as well as the tools and strategies needed to overcome common obstacles which may arise along the way.  From Head to Heart: The Power of a Healthy Outlook  Clinical staff led group instruction and group discussion with PowerPoint presentation and patient guidebook. To enhance the learning environment the use of posters, models and videos may be added. Patients will be able to recognize and describe the impact of emotions and mood on physical health. They will discover the importance of self-care and explore self-care practices which may work for them. Patients will also learn how to utilize the 4 C's to cultivate a healthier outlook and better manage stress and challenges. The purpose of this lesson is to demonstrate  to patients how a healthy outlook is an essential part of maintaining good health, especially as they continue their cardiac rehab journey.  Healthy Sleep for a Healthy Heart Clinical staff led group instruction and group discussion with PowerPoint presentation and patient guidebook. To enhance the learning environment the use of posters, models and videos may be added. At the conclusion of this workshop, patients will be able to demonstrate knowledge of the importance of sleep to overall health, well-being, and quality of life. They will understand the symptoms of, and treatments for, common sleep disorders. Patients will also be able to identify daytime and nighttime behaviors which impact sleep, and they will be able to apply these tools to help manage sleep-related challenges. The purpose of this lesson is to provide patients with a general overview of sleep and outline the importance  of quality sleep. Patients will learn about a few of the most common sleep disorders. Patients will also be introduced to the concept of "sleep hygiene," and discover ways to self-manage certain sleeping problems through simple daily behavior changes. Finally, the workshop will motivate patients by clarifying the links between quality sleep and their goals of heart-healthy living.   Recognizing and Reducing Stress Clinical staff led group instruction and group discussion with PowerPoint presentation and patient guidebook. To enhance the learning environment the use of posters, models and videos may be added. At the conclusion of this workshop, patients will be able to understand the types of stress reactions, differentiate between acute and chronic stress, and recognize the impact that chronic stress has on their health. They will also be able to apply different coping mechanisms, such as reframing negative self-talk. Patients will have the opportunity to practice a variety of stress management techniques, such as deep  abdominal breathing, progressive muscle relaxation, and/or guided imagery.  The purpose of this lesson is to educate patients on the role of stress in their lives and to provide healthy techniques for coping with it.  Learning Barriers/Preferences:  Learning Barriers/Preferences - 09/02/22 0841       Learning Barriers/Preferences   Learning Barriers Sight   glasses   Learning Preferences Audio;Computer/Internet;Group Instruction;Individual Instruction;Skilled Demonstration;Pictoral;Verbal Instruction;Video;Written Material             Education Topics:  Knowledge Questionnaire Score:  Knowledge Questionnaire Score - 09/02/22 0840       Knowledge Questionnaire Score   Pre Score 21/24             Core Components/Risk Factors/Patient Goals at Admission:  Personal Goals and Risk Factors at Admission - 09/02/22 0841       Core Components/Risk Factors/Patient Goals on Admission    Weight Management Yes;Obesity;Weight Loss    Intervention Weight Management: Provide education and appropriate resources to help participant work on and attain dietary goals.;Weight Management: Develop a combined nutrition and exercise program designed to reach desired caloric intake, while maintaining appropriate intake of nutrient and fiber, sodium and fats, and appropriate energy expenditure required for the weight goal.;Weight Management/Obesity: Establish reasonable short term and long term weight goals.;Obesity: Provide education and appropriate resources to help participant work on and attain dietary goals.    Admit Weight 200 lb 9.9 oz (91 kg)    Expected Outcomes Short Term: Continue to assess and modify interventions until short term weight is achieved;Long Term: Adherence to nutrition and physical activity/exercise program aimed toward attainment of established weight goal;Weight Loss: Understanding of general recommendations for a balanced deficit meal plan, which promotes 1-2 lb weight loss per  week and includes a negative energy balance of [phone] kcal/d;Understanding recommendations for meals to include 15-35% energy as protein, 25-35% energy from fat, 35-60% energy from carbohydrates, less than 200mg  of dietary cholesterol, 20-35 gm of total fiber daily;Understanding of distribution of calorie intake throughout the day with the consumption of 4-5 meals/snacks    Diabetes Yes    Intervention Provide education about signs/symptoms and action to take for hypo/hyperglycemia.;Provide education about proper nutrition, including hydration, and aerobic/resistive exercise prescription along with prescribed medications to achieve blood glucose in normal ranges: Fasting glucose 65-99 mg/dL    Expected Outcomes Short Term: Participant verbalizes understanding of the signs/symptoms and immediate care of hyper/hypoglycemia, proper foot care and importance of medication, aerobic/resistive exercise and nutrition plan for blood glucose control.;Long Term: Attainment of HbA1C < 7%.    Hypertension Yes  Intervention Provide education on lifestyle modifcations including regular physical activity/exercise, weight management, moderate sodium restriction and increased consumption of fresh fruit, vegetables, and low fat dairy, alcohol moderation, and smoking cessation.;Monitor prescription use compliance.    Expected Outcomes Short Term: Continued assessment and intervention until BP is < 140/79mm HG in hypertensive participants. < 130/26mm HG in hypertensive participants with diabetes, heart failure or chronic kidney disease.;Long Term: Maintenance of blood pressure at goal levels.    Lipids Yes    Intervention Provide education and support for participant on nutrition & aerobic/resistive exercise along with prescribed medications to achieve LDL 70mg , HDL >40mg .    Expected Outcomes Short Term: Participant states understanding of desired cholesterol values and is compliant with medications prescribed. Participant  is following exercise prescription and nutrition guidelines.;Long Term: Cholesterol controlled with medications as prescribed, with individualized exercise RX and with personalized nutrition plan. Value goals: LDL < 70mg , HDL > 40 mg.    Stress Yes    Intervention Offer individual and/or small group education and counseling on adjustment to heart disease, stress management and health-related lifestyle change. Teach and support self-help strategies.;Refer participants experiencing significant psychosocial distress to appropriate mental health specialists for further evaluation and treatment. When possible, include family members and significant others in education/counseling sessions.    Expected Outcomes Short Term: Participant demonstrates changes in health-related behavior, relaxation and other stress management skills, ability to obtain effective social support, and compliance with psychotropic medications if prescribed.;Long Term: Emotional wellbeing is indicated by absence of clinically significant psychosocial distress or social isolation.             Core Components/Risk Factors/Patient Goals Review:   Goals and Risk Factor Review     Row Name 09/20/22 1004 10/22/22 0814 11/14/22 1038 12/16/22 1725       Core Components/Risk Factors/Patient Goals Review   Personal Goals Review Weight Management/Obesity;Diabetes;Hypertension;Lipids;Stress Weight Management/Obesity;Diabetes;Hypertension;Lipids;Stress Weight Management/Obesity;Diabetes;Hypertension;Lipids;Stress Weight Management/Obesity;Diabetes;Hypertension;Lipids;Stress    Review Dellar is off to a good start to exercise at cardiac rehab. Vital signs and CBG's have been stable Kayla Berger is off to a good start to exercise at cardiac rehab. Vital signs and CBG's have been stable. Kayla Berger has lost 4.4 kg since starting cardiac rehab. Kayla Berger is doing well with exercise at cardiac rehab. Vital signs and CBG's have been stable. Kayla Berger has lost 1.0 kg  since starting cardiac rehab. Kayla Berger is doing well with exercise at cardiac rehab. Vital signs and CBG's have been stable. Kayla Berger has lost 3.3 kg since starting cardiac rehab. Kayla Berger will complete cardiac rehab on 12/20/22    Expected Outcomes Chazlyn will continue to participate in cardiac rehab for exercise, nutrition and lifestyle modifications. Terryann will continue to participate in cardiac rehab for exercise, nutrition and lifestyle modifications. Kataleia will continue to participate in cardiac rehab for exercise, nutrition and lifestyle modifications. Aniesa will continue to participate in cardiac rehab for exercise, nutrition and lifestyle modifications.             Core Components/Risk Factors/Patient Goals at Discharge (Final Review):   Goals and Risk Factor Review - 12/16/22 1725       Core Components/Risk Factors/Patient Goals Review   Personal Goals Review Weight Management/Obesity;Diabetes;Hypertension;Lipids;Stress    Review Kayla Berger is doing well with exercise at cardiac rehab. Vital signs and CBG's have been stable. Kayla Berger has lost 3.3 kg since starting cardiac rehab. Kayla Berger will complete cardiac rehab on 12/20/22    Expected Outcomes [place]ee will continue to participate in cardiac rehab for exercise, nutrition and lifestyle  modifications.             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 Introduction to pritikin education/ intensive cardiac rehab. Initial orientation packet reviewed with patient. 30 Day ITP Review. Melysa started cardiac rehab on 09/09/22 and is off to a good start to exercise. 30 Day ITP Review. Keandria has good attendance and participation in cardiac rehab. 30 Day ITP Review. Siearra continues to have good  attendance and participation in cardiac rehab. Kayla Berger will complete cardiac rehab at the end of October. 30 Day ITP Review. Rylah  continues to have good  attendance and participation in cardiac rehab. Kayla Berger will complete cardiac rehab on 12/20/22.            Comments: See ITP comments.Thayer Headings RN BSN

## 2022-12-18 ENCOUNTER — Encounter (HOSPITAL_COMMUNITY)
Admission: RE | Admit: 2022-12-18 | Discharge: 2022-12-18 | Disposition: A | Discharge status: Home / Self Care | Payer: 59 | Source: Ambulatory Visit | Attending: Cardiology | Admitting: Cardiology

## 2022-12-18 VITALS — Ht 63.0 in | Wt 193.6 lb

## 2022-12-18 DIAGNOSIS — Z951 Presence of aortocoronary bypass graft: Secondary | ICD-10-CM | POA: Diagnosis not present

## 2022-12-20 ENCOUNTER — Encounter (HOSPITAL_COMMUNITY)
Admission: RE | Admit: 2022-12-20 | Discharge: 2022-12-20 | Disposition: A | Discharge status: Home / Self Care | Payer: 59 | Source: Ambulatory Visit | Attending: Cardiology | Admitting: Cardiology

## 2022-12-20 DIAGNOSIS — Z951 Presence of aortocoronary bypass graft: Secondary | ICD-10-CM

## 2022-12-20 NOTE — Progress Notes (Signed)
Discharge Progress Report  Patient Details  Name: Kayla Berger MRN: [id number] Date of Birth: Jun 13, 1960 Referring Provider:   Flowsheet Row INTENSIVE CARDIAC REHAB ORIENT from 09/02/2022 in [facility]  Referring Provider Yates Decamp, MD        Number of Visits: 36 exercise and 23 education sessions  Reason for Discharge:  Patient reached a stable level of exercise. Patient independent in their exercise. Patient has met program and personal goals.  Smoking History:  Social History   Tobacco Use  Smoking Status Never  Smokeless Tobacco Never    Diagnosis:  S/P CABG x 2  ADL UCSD:   Initial Exercise Prescription:  Initial Exercise Prescription - 09/02/22 1200       Date of Initial Exercise RX and Referring Provider   Date 09/02/22    Referring Provider Yates Decamp, MD    Expected Discharge Date 11/27/22      Recumbant Bike   Level 1    RPM 55    Watts 30    Minutes 15    METs 2.5      NuStep   Level 1    SPM 60    Minutes 15    METs 2      Prescription Details   Frequency (times per week) 3    Duration Progress to 30 minutes of continuous aerobic without signs/symptoms of physical distress      Intensity   THRR 40-80% of Max Heartrate 64-127    Ratings of Perceived Exertion 11-13    Perceived Dyspnea 0-4      Progression   Progression Continue progressive overload as per policy without signs/symptoms or physical distress.      Resistance Training   Training Prescription Yes    Weight 2    Reps 10-15             Discharge Exercise Prescription (Final Exercise Prescription Changes):  Exercise Prescription Changes - 12/20/22 0849       Response to Exercise   Blood Pressure (Admit) 130/70    Blood Pressure (Exercise) --   Over 10 sessions no exercise BP needed   Blood Pressure (Exit) 114/72    Heart Rate (Admit) 85 bpm    Heart Rate (Exercise) 110 bpm    Heart Rate (Exit) 96 bpm     Rating of Perceived Exertion (Exercise) 11.5    Perceived Dyspnea (Exercise) 0    Symptoms chronic back pain    Comments Pt graduated the [employer] program    Duration Continue with 30 min of aerobic exercise without signs/symptoms of physical distress.    Intensity THRR unchanged      Progression   Progression Continue to progress workloads to maintain intensity without signs/symptoms of physical distress.    Average METs 3.3      Resistance Training   Training Prescription Yes    Weight 2 lbs    Reps 10-15    Time 10 Minutes      Recumbant Bike   Level 4    RPM 81    Watts 48    Minutes 15    METs 3.4      NuStep   Level 4    SPM 99    Minutes 15    METs 3.2             Functional Capacity:  6 Minute Walk     Row Name 09/02/22 1205 12/18/22  0829       6 Minute Walk   Phase Initial Discharge    Distance 1680 feet 1547 feet    Distance % Change -- -8.35 %    Distance Feet Change -- -133 ft    Walk Time 6 minutes 6 minutes    # of Rest Breaks 0 0    MPH 3.18 2.93    METS 3.82 3.63    RPE 13 11    Perceived Dyspnea  0 0    VO2 Peak 13.39 12.72    Symptoms Yes (comment) No    Comments 6/10 Bilateral hip pain --    Resting HR 92 bpm 94 bpm    Resting BP 110/74 142/78    Resting Oxygen Saturation  96 % --    Exercise Oxygen Saturation  during 6 min walk 98 % --    Max Ex. HR 114 bpm 115 bpm    Max Ex. BP 146/82 148/82    2 Minute Post BP 124/82 --             Psychological, QOL, Others - Outcomes: PHQ 2/9:    12/20/2022    7:42 AM 09/02/2022   12:05 PM  Depression screen PHQ 2/9  Decreased Interest 0 0  Down, Depressed, Hopeless 0 1  PHQ - 2 Score 0 1  Altered sleeping 0 1  Tired, decreased energy 0 1  Change in appetite 0 1  Feeling bad or failure about yourself  0 1  Trouble concentrating 0 0  Moving slowly or fidgety/restless 0 0  Suicidal thoughts 0 0  PHQ-9 Score 0 5  Difficult doing work/chores Not difficult at all Somewhat  difficult    Quality of Life:  Quality of Life - 12/20/22 0844       Quality of Life   Select Quality of Life      Quality of Life Scores   Health/Function Post 21.13 %    Socioeconomic Post 25.43 %    Psych/Spiritual Post 21.64 %    Family Post 24.9 %    GLOBAL Post 22.68 %             Personal Goals: Goals established at orientation with interventions provided to work toward goal.  Personal Goals and Risk Factors at Admission - 09/02/22 0841       Core Components/Risk Factors/Patient Goals on Admission    Weight Management Yes;Obesity;Weight Loss    Intervention Weight Management: Provide education and appropriate resources to help participant work on and attain dietary goals.;Weight Management: Develop a combined nutrition and exercise program designed to reach desired caloric intake, while maintaining appropriate intake of nutrient and fiber, sodium and fats, and appropriate energy expenditure required for the weight goal.;Weight Management/Obesity: Establish reasonable short term and long term weight goals.;Obesity: Provide education and appropriate resources to help participant work on and attain dietary goals.    Admit Weight 200 lb 9.9 oz (91 kg)    Expected Outcomes Short Term: Continue to assess and modify interventions until short term weight is achieved;Long Term: Adherence to nutrition and physical activity/exercise program aimed toward attainment of established weight goal;Weight Loss: Understanding of general recommendations for a balanced deficit meal plan, which promotes 1-2 lb weight loss per week and includes a negative energy balance of [phone] kcal/d;Understanding recommendations for meals to include 15-35% energy as protein, 25-35% energy from fat, 35-60% energy from carbohydrates, less than 200mg  of dietary cholesterol, 20-35 gm of total fiber daily;Understanding of distribution of  calorie intake throughout the day with the consumption of 4-5 meals/snacks     Diabetes Yes    Intervention Provide education about signs/symptoms and action to take for hypo/hyperglycemia.;Provide education about proper nutrition, including hydration, and aerobic/resistive exercise prescription along with prescribed medications to achieve blood glucose in normal ranges: Fasting glucose 65-99 mg/dL    Expected Outcomes Short Term: Participant verbalizes understanding of the signs/symptoms and immediate care of hyper/hypoglycemia, proper foot care and importance of medication, aerobic/resistive exercise and nutrition plan for blood glucose control.;Long Term: Attainment of HbA1C < 7%.    Hypertension Yes    Intervention Provide education on lifestyle modifcations including regular physical activity/exercise, weight management, moderate sodium restriction and increased consumption of fresh fruit, vegetables, and low fat dairy, alcohol moderation, and smoking cessation.;Monitor prescription use compliance.    Expected Outcomes Short Term: Continued assessment and intervention until BP is < 140/74mm HG in hypertensive participants. < 130/55mm HG in hypertensive participants with diabetes, heart failure or chronic kidney disease.;Long Term: Maintenance of blood pressure at goal levels.    Lipids Yes    Intervention Provide education and support for participant on nutrition & aerobic/resistive exercise along with prescribed medications to achieve LDL 70mg , HDL >40mg .    Expected Outcomes Short Term: Participant states understanding of desired cholesterol values and is compliant with medications prescribed. Participant is following exercise prescription and nutrition guidelines.;Long Term: Cholesterol controlled with medications as prescribed, with individualized exercise RX and with personalized nutrition plan. Value goals: LDL < 70mg , HDL > 40 mg.    Stress Yes    Intervention Offer individual and/or small group education and counseling on adjustment to heart disease, stress management  and health-related lifestyle change. Teach and support self-help strategies.;Refer participants experiencing significant psychosocial distress to appropriate mental health specialists for further evaluation and treatment. When possible, include family members and significant others in education/counseling sessions.    Expected Outcomes Short Term: Participant demonstrates changes in health-related behavior, relaxation and other stress management skills, ability to obtain effective social support, and compliance with psychotropic medications if prescribed.;Long Term: Emotional wellbeing is indicated by absence of clinically significant psychosocial distress or social isolation.              Personal Goals Discharge:  Goals and Risk Factor Review     Row Name 09/20/22 1004 10/22/22 0814 11/14/22 1038 12/16/22 1725 01/01/23 0959     Core Components/Risk Factors/Patient Goals Review   Personal Goals Review Weight Management/Obesity;Diabetes;Hypertension;Lipids;Stress Weight Management/Obesity;Diabetes;Hypertension;Lipids;Stress Weight Management/Obesity;Diabetes;Hypertension;Lipids;Stress Weight Management/Obesity;Diabetes;Hypertension;Lipids;Stress Weight Management/Obesity;Diabetes;Hypertension;Lipids;Stress   Review Robertta is off to a good start to exercise at cardiac rehab. Vital signs and CBG's have been stable Cathlin is off to a good start to exercise at cardiac rehab. Vital signs and CBG's have been stable. Yanitza has lost 4.4 kg since starting cardiac rehab. Shenekia is doing well with exercise at cardiac rehab. Vital signs and CBG's have been stable. Derita has lost 1.0 kg since starting cardiac rehab. Arthelia is doing well with exercise at cardiac rehab. Vital signs and CBG's have been stable. Jeslynn has lost 3.3 kg since starting cardiac rehab. Chalese will complete cardiac rehab on 12/20/22 Sophie complets cardiac rehab with 36 exercise sessions.Sabirin lost a total of 20 pounds while  participating in Intensive Cardiac Rehab.  She is commended for her efforts.  Avenell made good progress with her diabetes management.  Able to decrease the amount of insulin due to consistent exercise.  BP remained stable as well as lipids.  Denies any stress  as she has settled in to a heart healthy routine.   Expected Outcomes Marga will continue to participate in cardiac rehab for exercise, nutrition and lifestyle modifications. Verleen will continue to participate in cardiac rehab for exercise, nutrition and lifestyle modifications. Deion will continue to participate in cardiac rehab for exercise, nutrition and lifestyle modifications. Sundus will continue to participate in cardiac rehab for exercise, nutrition and lifestyle modifications. With Tennie level of progress achieving MET average of 3.1 despite having chronic pain, I anticipate she will contineu to do well in the future.  Plans to continue exercise with walking and has an alternate plan for weather days that are not optimal for outside exercise.  She will be missed by Cardiac rehab staff for her disposition and positive interaction with fellow participants.            Exercise Goals and Review:  Exercise Goals     Row Name 09/02/22 0804             Exercise Goals   Increase Physical Activity Yes       Intervention Provide advice, education, support and counseling about physical activity/exercise needs.;Develop an individualized exercise prescription for aerobic and resistive training based on initial evaluation findings, risk stratification, comorbidities and participant's personal goals.       Expected Outcomes Short Term: Attend rehab on a regular basis to increase amount of physical activity.;Long Term: Exercising regularly at least 3-5 days a week.;Long Term: Add in home exercise to make exercise part of routine and to increase amount of physical activity.       Increase Strength and Stamina Yes       Intervention Provide  advice, education, support and counseling about physical activity/exercise needs.;Develop an individualized exercise prescription for aerobic and resistive training based on initial evaluation findings, risk stratification, comorbidities and participant's personal goals.       Expected Outcomes Short Term: Increase workloads from initial exercise prescription for resistance, speed, and METs.;Short Term: Perform resistance training exercises routinely during rehab and add in resistance training at home;Long Term: Improve cardiorespiratory fitness, muscular endurance and strength as measured by increased METs and functional capacity ( )       Able to understand and use rate of perceived exertion (RPE) scale Yes       Intervention Provide education and explanation on how to use RPE scale       Expected Outcomes Short Term: Able to use RPE daily in rehab to express subjective intensity level;Long Term:  Able to use RPE to guide intensity level when exercising independently       Knowledge and understanding of Target Heart Rate Range (THRR) Yes       Intervention Provide education and explanation of THRR including how the numbers were predicted and where they are located for reference       Expected Outcomes Short Term: Able to state/look up THRR;Short Term: Able to use daily as guideline for intensity in rehab;Long Term: Able to use THRR to govern intensity when exercising independently       Understanding of Exercise Prescription Yes       Intervention Provide education, explanation, and written materials on patient's individual exercise prescription       Expected Outcomes Short Term: Able to explain program exercise prescription;Long Term: Able to explain home exercise prescription to exercise independently                Exercise Goals Re-Evaluation:  Exercise Goals Re-Evaluation  Row Name 09/09/22 1310 10/23/22 0822 11/15/22 1425 12/16/22 1421 12/20/22 1412     Exercise Goal  Re-Evaluation   Exercise Goals Review Increase Physical Activity;Understanding of Exercise Prescription;Increase Strength and Stamina;Knowledge and understanding of Target Heart Rate Range (THRR);Able to understand and use rate of perceived exertion (RPE) scale Increase Physical Activity;Understanding of Exercise Prescription;Increase Strength and Stamina;Knowledge and understanding of Target Heart Rate Range (THRR);Able to understand and use rate of perceived exertion (RPE) scale Increase Physical Activity;Understanding of Exercise Prescription;Increase Strength and Stamina;Knowledge and understanding of Target Heart Rate Range (THRR);Able to understand and use rate of perceived exertion (RPE) scale Increase Physical Activity;Understanding of Exercise Prescription;Increase Strength and Stamina;Knowledge and understanding of Target Heart Rate Range (THRR);Able to understand and use rate of perceived exertion (RPE) scale Increase Physical Activity;Understanding of Exercise Prescription;Increase Strength and Stamina;Knowledge and understanding of Target Heart Rate Range (THRR);Able to understand and use rate of perceived exertion (RPE) scale   Comments Pt first day in program, pt was educated on THRR, RPE, and METs. Pt exercised at an avg of 2.2 METs on the NS and 2.5 METs on the RB. Pt had to skip weights d/t her BS dropping, pt received glucose gel and other snacks to raise BS. Pt was asymptomatic. REVD MET's and goals. Pt tolerated exercise well with an average MET level of 2.6. She deals with chronic back pain, but is doing well and has increased WL's on both stations and is doing well with the change. She also states she has recieved many resources for ongoing diet information. REVD MET's and goals. Pt tolerated exercise well with an average MET level of 3.65. She continues to deal with chronic back pain, but is doing well and has increased WL's and plasn to move up next session. Sh says she's recieved a lot of  good diet info from the RD on heart healthy and DM. She's continuing to work on forming healthy habits. Overall doing very well and progressing MET's REVD MET's and goals. Pt tolerated exercise well with an average MET level of 3.65. Pt is feeling good about her goals and feels much better overall. She is increasing strength, stamina and MET's. Pt graduated the [employer] today. Pt tolerated exercise well with an average MET level of 3.3. Pt did very well despite having chronic pain. She decreased her percent BF and increased her balance. She is going to continue to exercise by walking, strength training and possibly going to [employer] or [employer] (she will call me if she needs referral). She will exercise 4-5 days for 30-45 mins per session   Expected Outcomes Will increase WL as tolerated barring s/s. Will continue to monitor pt and progress workloads as tolerated without sign or symptom Will continue to monitor pt and progress workloads as tolerated without sign or symptom Will continue to monitor pt and progress workloads as tolerated without sign or symptom Will continue to monitor pt and progress workloads as tolerated without sign or symptom            Nutrition & Weight - Outcomes:  Pre Biometrics - 09/02/22 0829       Pre Biometrics   Waist Circumference 42.5 inches    Hip Circumference 49 inches    Waist to Hip Ratio 0.87 %    Triceps Skinfold 45 mm    % Body Fat 48.1 %    Grip Strength 22 kg    Flexibility 0 in   pt has chronic back issues, not done  Single Leg Stand 5 seconds             Post Biometrics - 12/18/22 0830        Post  Biometrics   Height 5\' 3"  (1.6 m)    Weight 87.8 kg    Waist Circumference 41 inches    Hip Circumference 45.5 inches    Waist to Hip Ratio 0.9 %    BMI (Calculated) 34.3    Triceps Skinfold 38 mm    % Body Fat 46.1 %    Grip Strength 22 kg    Flexibility --   Did not attempt.   Single Leg Stand 8.7 seconds              Nutrition:  Nutrition Therapy & Goals - 12/20/22 1049       Nutrition Therapy   Diet Heart Healthy Diet    Drug/Food Interactions Statins/Certain Fruits      Personal Nutrition Goals   Nutrition Goal Patient to identify strategies for reducing cardiovascular risk by attending the [employer] education and nutrition series weekly.   goal in met.   Personal Goal #2 Patient to improve diet quality by using the plate method as a guide for meal planning to include lean protein/plant protein, fruits, vegetables, whole grains, nonfat dairy as part of a well-balanced diet.   goal in action.   Personal Goal #3 Patient to reduce sodium intake to 1500mg  per day   goal in action.   Personal Goal #4 Patient to identify strategies for blood sugar control with goal A1c <7%.   goal in progress. No updated A1c at this time.   Comments Goals in action. Patient has medical history of CAD, DM2, HTN, CABGx2. Verley continues to attend the [employer] education and nutrition series as able. She has started making many dietary changes including increased fiber intake, decreased "grazing"/snacking behaviors, and reading food labels for sodium/sugar. She is down 7.7# since starting with our program. No new labs at this time; most recent A1c remains elevated but expect continued improvement with dietary changes and weight loss. Patient will benefit from adherence to nutrition, exercise, and lifestyle modification recommendations.      Intervention Plan   Intervention Prescribe, educate and counsel regarding individualized specific dietary modifications aiming towards targeted core components such as weight, hypertension, lipid management, diabetes, heart failure and other comorbidities.;Nutrition handout(s) given to patient.    Expected Outcomes Short Term Goal: Understand basic principles of dietary content, such as calories, fat, sodium, cholesterol and nutrients.;Long Term Goal: Adherence to prescribed nutrition plan.              Nutrition Discharge:  Nutrition Assessments - 12/20/22 1531       Rate Your Plate Scores   Pre Score 63    Post Score 76             Education Questionnaire Score:  Knowledge Questionnaire Score - 09/02/22 0840       Knowledge Questionnaire Score   Pre Score 21/24             Goals reviewed. Pt graduates from  Intensive cardiac rehab program today with completion of 36 exercise and 29 education sessions. Pt maintained good attendance and progressed nicely during their participation in rehab as evidenced by increased MET level.   Medication list reconciled. Repeat  PHQ score-decreased from 5  to 0  .  Pt has made significant lifestyle changes and should be commended for her success.  Walida achieved their  goals during cardiac rehab.   Pt plans to continue exercise with walking and has information about [facility] as a possible option.Her witty sense of humor will be missed by staff and fellow participants. Alanson Aly, BSN Cardiac and Emergency planning/management officer

## 2023-01-02 ENCOUNTER — Encounter: Payer: Self-pay | Admitting: Cardiology

## 2023-01-02 ENCOUNTER — Other Ambulatory Visit: Payer: Self-pay | Admitting: *Deleted

## 2023-01-02 ENCOUNTER — Ambulatory Visit: Payer: 59 | Attending: Cardiology | Admitting: Cardiology

## 2023-01-02 VITALS — BP 134/82 | HR 67 | Resp 16 | Ht 63.0 in | Wt 186.6 lb

## 2023-01-02 DIAGNOSIS — E7800 Pure hypercholesterolemia, unspecified: Secondary | ICD-10-CM

## 2023-01-02 DIAGNOSIS — E1122 Type 2 diabetes mellitus with diabetic chronic kidney disease: Secondary | ICD-10-CM | POA: Diagnosis not present

## 2023-01-02 DIAGNOSIS — I2510 Atherosclerotic heart disease of native coronary artery without angina pectoris: Secondary | ICD-10-CM | POA: Diagnosis not present

## 2023-01-02 DIAGNOSIS — N1832 Chronic kidney disease, stage 3b: Secondary | ICD-10-CM

## 2023-01-02 DIAGNOSIS — I10 Essential (primary) hypertension: Secondary | ICD-10-CM

## 2023-01-02 DIAGNOSIS — I2584 Coronary atherosclerosis due to calcified coronary lesion: Secondary | ICD-10-CM

## 2023-01-02 DIAGNOSIS — E8779 Other fluid overload: Secondary | ICD-10-CM

## 2023-01-02 DIAGNOSIS — Z794 Long term (current) use of insulin: Secondary | ICD-10-CM

## 2023-01-02 MED ORDER — FUROSEMIDE 20 MG PO TABS: ORAL_TABLET | ORAL | 11 refills | Status: DC

## 2023-01-02 MED ORDER — LOSARTAN POTASSIUM 25 MG PO TABS: 25.0000 mg | ORAL_TABLET | Freq: Every day | ORAL | 6 refills | Status: DC

## 2023-01-02 NOTE — Progress Notes (Signed)
Cardiology Office Note:  .   Date:  01/02/2023  ID:  Kayla Berger, DOB 1960/02/09, MRN [id number] PCP: Irena Reichmann, DO  [facility] Providers Cardiologist:  Yates Decamp, MD   History of Present Illness: .   Kayla Berger is a 62 y.o.  Patient with hypertension, hyperlipidemia, diabetes mellitus with stage IV chronic kidney disease, autonomic insufficiency and orthostatic hypotension, decreased exercise tolerance, underwent cardiac catheterization on 04/05/2022 revealing calcific high-grade stenosis in D1 and also at least a 70 to 80% stenosis in the mid LAD.  She underwent CABG x 2 with free RIMA to mid LAD and T graft to D1 on 07/26/2022 at [facility].   She has had recurrent UTI and presently on ciprofloxacin.  Discussed the use of AI scribe software for clinical note transcription with the patient, who gave verbal consent to proceed.  History of Present Illness   The patient, with a history of diabetes, coronary artery disease, and hypercholesterolemia, presents CAD S/P CABG follow up.   She has been having recurrent UTI.  She has been practicing good hygiene, but the infections persist. She was previously on Farxiga, which was discontinued due to suspicion of causing the UTIs, but the infections have continued. She is currently on Rybelsus for diabetes and has lost ten pounds since starting the medication. She reports some nausea with the medication but is managing it.  The patient recently completed cardiac rehab and is maintaining a regular exercise routine, [place] due to being a fall risk. She reports no chest pain or shortness of breath, and her breathing has improved since her bypass surgery. However, she experiences pain in her sternum, likely due to the surgery. She is unsure of the exact type of bypass surgery she had and plans to request clarification from the surgeon.  The patient also reports that she has been experiencing joint  pain since starting atorvastatin 40mg  for her hypercholesterolemia. She has self-medicated by halving the dose, which has significantly reduced the joint pain.      Review of Systems  Cardiovascular:  Negative for chest pain, dyspnea on exertion and leg swelling.    Labs   Lab Results  Component Value Date   CHOL 128 07/02/2012   HDL 38 (L) 07/02/2012   LDLCALC 24 07/02/2012   TRIG 328 (H) 07/02/2012   CHOLHDL 3.4 07/02/2012   Lab Results  Component Value Date   NA 142 11/23/2022   K 3.3 (L) 11/23/2022   CO2 26 11/23/2022   GLUCOSE 129 (H) 11/23/2022   BUN 8 11/23/2022   CREATININE 0.97 11/23/2022   CALCIUM 8.0 (L) 11/23/2022   EGFR 58 (L) 05/09/2022   GFRNONAA >60 11/23/2022      Latest Ref Rng & Units 11/23/2022    5:25 PM 05/09/2022    8:11 AM 03/28/2022    8:11 AM  BMP  Glucose 70 - 99 mg/dL 841  76  660   BUN 8 - 23 mg/dL 8  24  23    Creatinine 0.44 - 1.00 mg/dL 6.30  1.60  1.09   BUN/Creat Ratio 12 - 28  22  23    Sodium 135 - 145 mmol/L 142  146  145   Potassium 3.5 - 5.1 mmol/L 3.3  4.4  4.5   Chloride 98 - 111 mmol/L 107  107  106   CO2 22 - 32 mmol/L 26  23  26    Calcium 8.9 - 10.3 mg/dL 8.0  9.4  9.4       Latest Ref Rng & Units 11/23/2022    5:25 PM 03/28/2022    8:11 AM 01/19/2022    5:28 PM  CBC  WBC 4.0 - 10.5 K/uL 7.6  8.1    Hemoglobin 12.0 - 15.0 g/dL 40.9  81.1  91.4   Hematocrit 36.0 - 46.0 % 41.0  40.7  38.0   Platelets 150 - 400 K/uL 193  238     External Labs:  Labs 06/12/2022:   Albumin to creatinine ratio 36.   Total cholesterol 01/28/1938, triglycerides 126, HDL 47, LDL 71.   A1c 7.0%.   Serum glucose 100 mg, BUN 18, creatinine 1.36, EGFR 44 mL, potassium 4.7, LFTs normal.  Physical Exam:   VS:  BP 134/82 (BP Location: Right Arm, Patient Position: Sitting, Cuff Size: Large)   Pulse 67   Resp 16   Ht 5\' 3"  (1.6 m)   Wt 186 lb 9.6 oz (84.6 kg)   LMP 10/06/2012   SpO2 97%   BMI 33.05 kg/m    Wt Readings from Last 3  Encounters:  01/02/23 186 lb 9.6 oz (84.6 kg)  12/18/22 193 lb 9 oz (87.8 kg)  11/22/22 192 lb 14.4 oz (87.5 kg)     Physical Exam Constitutional:      Appearance: She is obese.  Neck:     Vascular: No carotid bruit or JVD.  Cardiovascular:     Rate and Rhythm: Normal rate and regular rhythm.     Pulses: Intact distal pulses.     Heart sounds: Normal heart sounds. No murmur heard.    No gallop.  Pulmonary:     Effort: Pulmonary effort is normal.     Breath sounds: Normal breath sounds.  Abdominal:     General: Bowel sounds are normal.     Palpations: Abdomen is soft.  Musculoskeletal:     Right lower leg: No edema.     Left lower leg: No edema.     Studies Reviewed: [name]    Left Heart Catheterization 04/05/22:  LV: 137/3, EDP 20 mmHg.  Ao 01/29/2027/68, mean 96 mmHg.  No pressure gradient across the aortic valve.  LA 7 mmHg.   RCA: Normal right coronary artery. LM: Large-caliber vessel, smooth and normal. LCx: Large-caliber vessel, mid segment has a 30% stenosis, gives origin to a moderate-sized OM 2 but otherwise small OM 1 and 3. LAD: Severely calcified all the way from the proximal to mid segment.  Gives origin to a moderate to large size diagonal 1.  D1 in the proximal segment has a calcific 90% stenosis.  LAD has tandem severe calcific 80% stenosis.  In some views appear to be 40 to 50%. RFR: LAD RFR performed, at rest 0.87, 30 seconds after 200 mcg IC nitroglycerin RFR was 0.80 suggestive of physiologically significant LAD stenosis.       S/P CABG x 2 on 07/26/2022 with free RIMA to mid LAD and T graft to D1 EKG:         EKG 09/27/2022: Normal sinus rhythm at rate of 84 bpm, left atrial enlargement, normal axis. Poor R progression, cannot exclude anteroseptal infarct 4. Nonspecific T abnormality. Low-voltage complexes. Pulmonary disease pattern.   Medications and allergies    Allergies  Allergen Reactions   Hydrocodone Itching, Swelling and Other (See Comments)     Numbness of the face/hands and tingling also     Current Outpatient Medications:    Alpha Lipoic Acid 200  MG CAPS, Take 200 mg by mouth at bedtime., Disp: , Rfl:    aspirin 81 MG chewable tablet, Chew 81 mg by mouth in the morning., Disp: , Rfl:    atorvastatin (LIPITOR) 40 MG tablet, Take 20 mg by mouth at bedtime., Disp: , Rfl:    Biotin [address] MCG TABS, Take 10,000 mcg by mouth every evening., Disp: , Rfl:    cetirizine (ZYRTEC) 10 MG tablet, Take 10 mg by mouth in the morning., Disp: , Rfl:    Cholecalciferol 25 MCG (1000 UT) capsule, Take 1,000 Units by mouth 2 (two) times daily., Disp: , Rfl:    ciprofloxacin (CIPRO) 500 MG tablet, Take 500 mg by mouth 2 (two) times daily., Disp: , Rfl:    clopidogrel (PLAVIX) 75 MG tablet, Take 1 tablet (75 mg total) by mouth daily., Disp: 90 tablet, Rfl: 1   CRANBERRY PO, Take 15,000 mg by mouth in the morning., Disp: , Rfl:    denosumab (PROLIA) 60 MG/ML SOSY injection, Inject 60 mg into the skin every 6 (six) months., Disp: , Rfl:    DULoxetine (CYMBALTA) 60 MG capsule, Take 60 mg by mouth 2 (two) times daily., Disp: , Rfl:    gabapentin (NEURONTIN) 800 MG tablet, Take 400 mg by mouth at bedtime., Disp: , Rfl:    HUMALOG KWIKPEN 100 UNIT/ML KwikPen, Inject 5-12 Units into the skin 3 (three) times daily as needed (for a BGL greater than 100). Presently doing 4-8 per meal 3 x day, Disp: , Rfl:    insulin glargine, 2 Unit Dial, (TOUJEO MAX SOLOSTAR) 300 UNIT/ML Solostar Pen, Inject 10 Units into the skin at bedtime. (Patient taking differently: Inject 45 Units into the skin at bedtime. Pt decreasing dosage according to her cbg readings presently taking 32 units), Disp: , Rfl:    losartan (COZAAR) 25 MG tablet, Take 1 tablet (25 mg total) by mouth daily., Disp: 30 tablet, Rfl: 6   metoprolol tartrate (LOPRESSOR) 25 MG tablet, Take 1 tablet by mouth twice daily, Disp: 180 tablet, Rfl: 0   Multiple Vitamins-Minerals (CENTRUM WOMEN) TABS, Take 1 tablet by  mouth daily with breakfast., Disp: , Rfl:    Nutritional Supplements (EQUATE PO), Take by mouth., Disp: , Rfl:    pantoprazole (PROTONIX) 40 MG tablet, Take 1 tablet (40 mg total) by mouth 2 (two) times daily. (Patient taking differently: Take 40 mg by mouth daily.), Disp: 60 tablet, Rfl: 0   polyethylene glycol powder (GLYCOLAX/MIRALAX) 17 GM/SCOOP powder, Take 1 Container by mouth daily as needed., Disp: , Rfl:    RYBELSUS 3 MG TABS, Take 1 tablet by mouth daily., Disp: , Rfl:    zinc gluconate 50 MG tablet, Take 50 mg by mouth in the morning., Disp: , Rfl:    furosemide (LASIX) 20 MG tablet, TAKE 1 TABLET BY MOUTH ONCE DAILY AS NEEDED FOR FLUID, Disp: 30 tablet, Rfl: 11   ondansetron (ZOFRAN-ODT) 4 MG disintegrating tablet, Take 1 tablet (4 mg total) by mouth every 8 (eight) hours as needed for nausea or vomiting. (Patient not taking: Reported on 01/02/2023), Disp: 12 tablet, Rfl: 0   ASSESSMENT AND PLAN: .      ICD-10-CM   1. Coronary artery disease due to calcified coronary lesion  I25.10 Lipid Profile   I25.84 Basic Metabolic Panel (BMET)    2. Type 2 diabetes mellitus with stage 3b chronic kidney disease, with long-term current use of insulin (HCC)  E11.22 Lipid Profile   N18.32 Basic Metabolic Panel (BMET)  Z79.4     3. Pure hypercholesterolemia  E78.00 Lipid Profile    Basic Metabolic Panel (BMET)    4. Primary hypertension  I10 Lipid Profile    Basic Metabolic Panel (BMET)    5. Other hypervolemia  E87.79 Lipid Profile    furosemide (LASIX) 20 MG tablet    Basic Metabolic Panel (BMET)      1. Coronary artery disease due to calcified coronary lesion Patient is presently doing well and since CABG, she has noticed marked improvement in dyspnea and she has not had any further chest discomfort.  Prior to surgery.  Mostly dyspnea as well  2. Type 2 diabetes mellitus with stage 3b chronic kidney disease, with long-term current use of insulin (HCC) Diabetes is improving, renal  function has remained stable, external labs reviewed.  She is tolerating Rybelsus with 10 pound weight loss over the past 6 months.  She will discuss with her endocrinologist to see if she could increase the dose.  3.  Pure hypercholesterolemia Patient over the past 6 weeks has reduced the dose of atorvastatin due to myalgias to 20 mg daily.  Will check her lipids in 2 weeks, if LDL is not at goal, will add Zetia 10 mg daily.  4.  Primary hypertension Patient is presently on metoprolol tartrate 25 mg twice daily.  In view of diabetes mellitus, stage IIIb chronic kidney disease, will add losartan 25 mg daily and will recheck BMP in 2 weeks.  Otherwise stable from cardiac standpoint I will see her back in a year or sooner if problems.   Signed,  Yates Decamp, MD, [facility] 01/02/2023, 12:36 PM [facility] [address] [place], [place] [address] Phone: [phone]. Fax:  [phone]

## 2023-01-02 NOTE — Patient Instructions (Addendum)
Medication Instructions:  Your physician has recommended you make the following change in your medication:  Start Losartan 25 mg by mouth daily Stop potassium Stop Clopidogel in June 2025  *If you need a refill on your cardiac medications before your next appointment, please call your pharmacy*   Lab Work: Lab work to be done in 2 weeks. (Around 12/19) Lipid and BMP.  This will be fasting.  You can have lab work done at any [facility].  There is a Chief Operating Officer on the first floor of our building If you have labs (blood work) drawn today and your tests are completely normal, you will receive your results only by: [employer] (if you have MyChart) OR A paper copy in the mail If you have any lab test that is abnormal or we need to change your treatment, we will call you to review the results.   Testing/Procedures: none   Follow-Up: At [facility], you and your health needs are our priority.  As part of our continuing mission to provide you with exceptional heart care, we have created designated Provider Care Teams.  These Care Teams include your primary Cardiologist (physician) and Advanced Practice Providers (APPs -  Physician Assistants and Nurse Practitioners) who all work together to provide you with the care you need, when you need it.  We recommend signing up for the patient portal called "MyChart".  Sign up information is provided on this After Visit Summary.  MyChart is used to connect with patients for Virtual Visits (Telemedicine).  Patients are able to view lab/test results, encounter notes, upcoming appointments, etc.  Non-urgent messages can be sent to your provider as well.   To learn more about what you can do with MyChart, go to [email or web address].    Your next appointment:   12 month(s)  Provider:   Yates Decamp, MD     Other Instructions

## 2023-01-14 ENCOUNTER — Telehealth: Payer: Self-pay | Admitting: Cardiology

## 2023-01-14 DIAGNOSIS — R000 Tachycardia, unspecified: Secondary | ICD-10-CM

## 2023-01-14 DIAGNOSIS — I25118 Atherosclerotic heart disease of native coronary artery with other forms of angina pectoris: Secondary | ICD-10-CM

## 2023-01-14 MED ORDER — METOPROLOL TARTRATE 25 MG PO TABS: 25.0000 mg | ORAL_TABLET | Freq: Two times a day (BID) | ORAL | 3 refills | Status: DC

## 2023-01-15 ENCOUNTER — Encounter: Payer: Self-pay | Admitting: Cardiology

## 2023-01-18 LAB — LIPID PANEL
Chol/HDL Ratio: 3.3 {ratio} (ref 0.0–4.4)
Cholesterol, Total: 143 mg/dL (ref 100–199)
HDL: 43 mg/dL (ref >39)
LDL Chol Calc (NIH): 63 mg/dL (ref 0–99)
Triglycerides: 232 mg/dL — ABNORMAL HIGH (ref 0–149)
VLDL Cholesterol Cal: 37 mg/dL (ref 5–40)

## 2023-02-05 ENCOUNTER — Other Ambulatory Visit: Payer: Self-pay | Admitting: Cardiology

## 2023-02-05 DIAGNOSIS — I2510 Atherosclerotic heart disease of native coronary artery without angina pectoris: Secondary | ICD-10-CM

## 2023-02-05 DIAGNOSIS — Z951 Presence of aortocoronary bypass graft: Secondary | ICD-10-CM

## 2023-03-05 ENCOUNTER — Other Ambulatory Visit: Payer: Self-pay

## 2023-03-05 ENCOUNTER — Encounter (HOSPITAL_COMMUNITY): Payer: Self-pay | Admitting: *Deleted

## 2023-03-05 ENCOUNTER — Emergency Department (HOSPITAL_COMMUNITY)
Admission: EM | Admit: 2023-03-05 | Discharge: 2023-03-06 | Disposition: A | Discharge status: Home / Self Care | Payer: 59 | Attending: Emergency Medicine | Admitting: Emergency Medicine

## 2023-03-05 DIAGNOSIS — Z79899 Other long term (current) drug therapy: Secondary | ICD-10-CM | POA: Diagnosis not present

## 2023-03-05 DIAGNOSIS — M25552 Pain in left hip: Secondary | ICD-10-CM | POA: Diagnosis present

## 2023-03-05 DIAGNOSIS — Z794 Long term (current) use of insulin: Secondary | ICD-10-CM | POA: Diagnosis not present

## 2023-03-05 DIAGNOSIS — Z7982 Long term (current) use of aspirin: Secondary | ICD-10-CM | POA: Diagnosis not present

## 2023-03-05 DIAGNOSIS — Z7902 Long term (current) use of antithrombotics/antiplatelets: Secondary | ICD-10-CM | POA: Insufficient documentation

## 2023-03-05 DIAGNOSIS — M545 Low back pain, unspecified: Secondary | ICD-10-CM | POA: Insufficient documentation

## 2023-03-05 NOTE — ED Triage Notes (Signed)
The pt fell Sunday and she has numerus areas of pain in her spine

## 2023-03-05 NOTE — ED Provider Triage Note (Signed)
 Emergency Medicine Provider Triage Evaluation Note  Kayla Berger , a 63 y.o. female  was evaluated in triage.  Pt complains of fall on Sunday.  Reports some head pain, neck pain, and left lumbar back pain.  She does take Plavix .  Reports she has a history of multiple compression fractures in the back.  Denies loss of consciousness..  Review of Systems  Positive: Head pain, neck pain, back pain Negative: Numbness, tingling  Physical Exam  BP (!) 151/92 (BP Location: Left Arm)   Pulse (!) 102   Temp 97.9 F (36.6 C) (Oral)   Resp 17   Ht 5' 2 (1.575 m)   Wt 82.1 kg   LMP 10/06/2012   SpO2 100%   BMI 33.10 kg/m  Gen:   Awake, no distress   Resp:  Normal effort  MSK:   Moves extremities without difficulty  Other:  Focal TTP in left lumbar paraspinous muscles, no significant midline tenderness   Medical Decision Making  Medically screening exam initiated at 6:47 PM.  Appropriate orders placed.  Kayla Berger was informed that the remainder of the evaluation will be completed by another provider, this initial triage assessment does not replace that evaluation, and the importance of remaining in the ED until their evaluation is complete.  Workup initiated in triage    Kayla Berger, [place] 03/05/23 [phone]

## 2023-03-06 ENCOUNTER — Emergency Department (HOSPITAL_COMMUNITY): Payer: 59

## 2023-03-06 MED ORDER — ACETAMINOPHEN 325 MG PO TABS
650.0000 mg | ORAL_TABLET | Freq: Four times a day (QID) | ORAL | Status: DC | PRN
  Administered 2023-03-06: 650 mg via ORAL
  Filled 2023-03-06: qty 2

## 2023-03-06 NOTE — ED Provider Notes (Signed)
 [place] EMERGENCY DEPARTMENT AT [facility] Provider Note   CSN: [id number] Arrival date & time: 03/05/23  1817     History  Chief Complaint  Patient presents with   Kayla Berger    Kayla Berger is a 63 y.o. female.  Patient complains of pain in her back and pain going down her left side.  Patient reports she fell on Sunday and has had soreness since.  Patient reports she spoke to her doctor who advised her to come to the emergency department for an x-ray due to the fact that she has had multiple compression fractures in the past.  Patient reports she has been able to ambulate but is uncomfortable.  Patient denies any numbness or tingling she has had some discomfort in her left hip and some discomfort down her left leg  The history is provided by the patient.  Fall       Home Medications Prior to Admission medications   Medication Sig Start Date End Date Taking? Authorizing Provider  Alpha Lipoic Acid 200 MG CAPS Take 200 mg by mouth at bedtime.   Yes [provider]  ascorbic acid (VITAMIN C) 500 MG tablet Take 500 mg by mouth daily.   Yes [provider]  aspirin  81 MG chewable tablet Chew 81 mg by mouth in the morning.   Yes [provider]  atorvastatin  (LIPITOR) 40 MG tablet Take 20 mg by mouth every evening.   Yes [provider]  cetirizine (ZYRTEC) 10 MG tablet Take 10 mg by mouth in the morning.   Yes [provider]  Cholecalciferol 25 MCG (1000 UT) capsule Take 1,000 Units by mouth 2 (two) times daily.   Yes [provider]  clopidogrel  (PLAVIX ) 75 MG tablet Take 1 tablet by mouth once daily 02/05/23  Yes Ladona Heinz, MD  Continuous Glucose Sensor (FREESTYLE LIBRE 3 PLUS SENSOR) MISC Apply 1 patch topically every 14 (fourteen) days. 01/11/23  Yes [provider]  denosumab (PROLIA) 60 MG/ML SOSY injection Inject 60 mg into the skin every 6 (six) months. 06/18/22  Yes [provider]  DULoxetine   (CYMBALTA ) 60 MG capsule Take 60 mg by mouth 2 (two) times daily.   Yes [provider]  furosemide  (LASIX ) 20 MG tablet TAKE 1 TABLET BY MOUTH ONCE DAILY AS NEEDED FOR FLUID Patient taking differently: Take 20 mg by mouth daily. TAKE 1 TABLET BY MOUTH ONCE DAILY 01/02/23  Yes Ladona Heinz, MD  gabapentin  (NEURONTIN ) 800 MG tablet Take 400 mg by mouth at bedtime. 08/15/21  Yes [provider]  HUMALOG KWIKPEN 100 UNIT/ML KwikPen Inject 5-10 Units into the skin See admin instructions. Take 5-10 units subcutaneously before each meal and snack.   Yes [provider]  insulin  glargine, 2 Unit Dial, (TOUJEO  MAX SOLOSTAR) 300 UNIT/ML Solostar Pen Inject 10 Units into the skin at bedtime. Patient taking differently: Inject 62 Units into the skin at bedtime. 08/08/21  Yes Sheikh, Omair Latif, DO  losartan  (COZAAR ) 25 MG tablet Take 1 tablet (25 mg total) by mouth daily. 01/02/23 04/02/23 Yes Ladona Heinz, MD  methocarbamol  (ROBAXIN ) 500 MG tablet Take 500 mg by mouth at bedtime as needed for muscle spasms.   Yes [provider]  metoprolol  tartrate (LOPRESSOR ) 25 MG tablet Take 1 tablet (25 mg total) by mouth 2 (two) times daily. Patient taking differently: Take 12.5 mg by mouth 2 (two) times daily. 01/14/23  Yes Ladona Heinz, MD  ondansetron  (ZOFRAN -ODT) 4 MG disintegrating  tablet Take 1 tablet (4 mg total) by mouth every 8 (eight) hours as needed for nausea or vomiting. 11/23/22  Yes Francesca Elsie CROME, MD  pantoprazole  (PROTONIX ) 40 MG tablet Take 1 tablet (40 mg total) by mouth 2 (two) times daily. Patient taking differently: Take 40 mg by mouth daily. 08/08/21  Yes Sheikh, Omair Latif, DO  polyethylene glycol powder (GLYCOLAX/MIRALAX) 17 GM/SCOOP powder Take 1 Container by mouth daily as needed.   Yes [provider]  RYBELSUS  3 MG TABS Take 1 tablet by mouth daily. 08/23/22  Yes [provider]  traMADol  (ULTRAM ) 50 MG tablet Take 50 mg by mouth at bedtime as  needed.   Yes [provider]      Allergies    Hydrocodone     Review of Systems   Review of Systems  All other systems reviewed and are negative.   Physical Exam Updated Vital Signs BP 119/73   Pulse 88   Temp 98 F (36.7 C)   Resp 18   Ht 5' 2 (1.575 m)   Wt 82.1 kg   LMP 10/06/2012   SpO2 99%   BMI 33.10 kg/m  Physical Exam Vitals and nursing note reviewed.  Constitutional:      Appearance: She is well-developed.  HENT:     Head: Normocephalic.  Cardiovascular:     Rate and Rhythm: Normal rate.  Pulmonary:     Effort: Pulmonary effort is normal.  Abdominal:     General: There is no distension.  Musculoskeletal:        General: Normal range of motion.     Cervical back: Normal range of motion.     Comments: Tender lower lumbar spine diffusely tender right pelvis area, full range of motion lower extremities  Neurological:     Mental Status: She is alert and oriented to person, place, and time.     ED Results / Procedures / Treatments   Labs (all labs ordered are listed, but only abnormal results are displayed) Labs Reviewed - No data to display  EKG None  Radiology DG Lumbar Spine Complete Result Date: 03/06/2023 CLINICAL DATA:  Fall 4 days ago. Low back and left hip pain. History of kyphoplasty. EXAM: LUMBAR SPINE - COMPLETE 4+ VIEW COMPARISON:  CT abdomen and pelvis 11/23/2022. Lumbar spine MRI 09/26/2022. CT chest, abdomen, and pelvis 01/19/2022. FINDINGS: There is transitional lumbosacral anatomy with inconsistent numbering used on previous imaging reports. There are 12 full sized pairs of ribs and 6 lumbar type vertebrae, the lowest of which will be considered a lumbarized S1. This numbering is consistent with that used on the 09/26/2022 MRI. There is mild lower lumbar dextroscoliosis. Trace retrolisthesis of L4 on L5 is unchanged. Chronic compression fractures are again seen at T12, L2, L3, L4, and L5, unchanged from 11/23/2022 with previous  augmentation of the L2-L4 fractures. No acute fracture is identified. Moderate facet arthrosis is noted in the mid to lower lumbar spine. There is mild disc space narrowing at L3-4. Aortic atherosclerosis and right upper quadrant abdominal surgical clips are noted. IMPRESSION: 1. Transitional lumbosacral anatomy as above. 2. Multiple chronic compression fractures.  No acute fracture. Electronically Signed   By: Dasie Hamburg M.D.   On: 03/06/2023 08:14   DG Pelvis 1-2 Views Result Date: 03/06/2023 CLINICAL DATA:  63 year old female status post fall 4 days ago with continued pain. EXAM: PELVIS - 1-2 VIEW COMPARISON:  CT Abdomen and Pelvis 11/23/2022. FINDINGS: Partially visible chronic lumbar vertebral augmentation. Femoral heads  remain normally located. Pelvis appears stable and intact. Grossly intact proximal femurs. Femoral artery calcified atherosclerosis. Nonobstructed bowel-gas pattern. Pelvic phleboliths. IMPRESSION: No acute fracture or dislocation identified about the pelvis. Electronically Signed   By: VEAR Hurst M.D.   On: 03/06/2023 08:14    Procedures Procedures    Medications Ordered in ED Medications  acetaminophen  (TYLENOL ) tablet 650 mg (650 mg Oral Given 03/06/23 0800)    ED Course/ Medical Decision Making/ A&P                                 Medical Decision Making Patient complains of pain in her low back after a fall on Sunday.  Patient has a history of compression fractures and was advised by her primary care physician come to the emergency department for x-rays to make sure she did not have any further compressions  Amount and/or Complexity of Data Reviewed Radiology: ordered and independent interpretation performed. Decision-making details documented in ED Course.    Details: X-ray raise lumbar spine and pelvis shows multiple old compression fractures.  No acute injuries.  Risk OTC drugs.           Final Clinical Impression(s) / ED Diagnoses Final diagnoses:   Acute low back pain, unspecified back pain laterality, unspecified whether sciatica present    Rx / DC Orders ED Discharge Orders     None     An After Visit Summary was printed and given to the patient.     Flint Sonny POUR, PA-C 03/06/23 [id number]    Kingsley, Victoria K, DO 03/06/23 1014

## 2023-07-31 ENCOUNTER — Other Ambulatory Visit: Payer: Self-pay | Admitting: Cardiology

## 2023-08-09 ENCOUNTER — Emergency Department (HOSPITAL_BASED_OUTPATIENT_CLINIC_OR_DEPARTMENT_OTHER): Admission: EM | Admit: 2023-08-09 | Discharge: 2023-08-09 | Disposition: A | Discharge status: Home / Self Care

## 2023-08-09 ENCOUNTER — Emergency Department (HOSPITAL_BASED_OUTPATIENT_CLINIC_OR_DEPARTMENT_OTHER)

## 2023-08-09 ENCOUNTER — Encounter (HOSPITAL_BASED_OUTPATIENT_CLINIC_OR_DEPARTMENT_OTHER): Payer: Self-pay

## 2023-08-09 ENCOUNTER — Other Ambulatory Visit: Payer: Self-pay

## 2023-08-09 DIAGNOSIS — Z7902 Long term (current) use of antithrombotics/antiplatelets: Secondary | ICD-10-CM | POA: Diagnosis not present

## 2023-08-09 DIAGNOSIS — Z794 Long term (current) use of insulin: Secondary | ICD-10-CM | POA: Diagnosis not present

## 2023-08-09 DIAGNOSIS — R519 Headache, unspecified: Secondary | ICD-10-CM | POA: Diagnosis present

## 2023-08-09 DIAGNOSIS — W19XXXA Unspecified fall, initial encounter: Secondary | ICD-10-CM

## 2023-08-09 DIAGNOSIS — I2510 Atherosclerotic heart disease of native coronary artery without angina pectoris: Secondary | ICD-10-CM | POA: Diagnosis not present

## 2023-08-09 DIAGNOSIS — W010XXA Fall on same level from slipping, tripping and stumbling without subsequent striking against object, initial encounter: Secondary | ICD-10-CM | POA: Diagnosis not present

## 2023-08-09 DIAGNOSIS — Y92009 Unspecified place in unspecified non-institutional (private) residence as the place of occurrence of the external cause: Secondary | ICD-10-CM | POA: Insufficient documentation

## 2023-08-09 DIAGNOSIS — S0093XA Contusion of unspecified part of head, initial encounter: Secondary | ICD-10-CM | POA: Insufficient documentation

## 2023-08-09 DIAGNOSIS — Y9301 Activity, walking, marching and hiking: Secondary | ICD-10-CM | POA: Diagnosis not present

## 2023-08-09 DIAGNOSIS — Z7982 Long term (current) use of aspirin: Secondary | ICD-10-CM | POA: Diagnosis not present

## 2023-08-09 NOTE — ED Provider Notes (Signed)
 [place] EMERGENCY DEPARTMENT AT [facility] Provider Note   CSN: [id number] Arrival date & time: 08/09/23  1916     Patient presents with: Kayla Berger   Kayla Berger is a 63 y.o. female.   HPI  Patient presents with fall.  Patient states he is on Plavix  in the setting of her known coronary artery disease.  Patient states that she tripped when try to walk into the house.  Did hit her head.  Did not lose conscious.  Endorses a mild headache.  Denies any kind of chest pain before or after the event.  Patient states that she just tripped over her own feet.  No numbness or tingling.  She states she has chronic lower back pain but nothing new.  No bowel or bladder incontinence.  No saddle anesthesia.  Patient endorses some slight pain paraspinal on the left side of her cervical spine area when moving her head back-and-forth but otherwise denies any, new numbness or ting anywhere.  No midline cervical pain.  She states that otherwise she has been feeling fine without any kind of complaints.  Feels at her baseline.  No recent exertional chest pain or shortness of breath.  Patient states only reason she came to the ED today is because of her mechanical fall with head strike.    Previous medical history reviewed : Patient was last seen in February 2025.  Was seen because of fall.  Had a negative workup at that time.     Prior to Admission medications   Medication Sig Start Date End Date Taking? Authorizing Provider  Alpha Lipoic Acid 200 MG CAPS Take 200 mg by mouth at bedtime.    [provider]  ascorbic acid (VITAMIN C) 500 MG tablet Take 500 mg by mouth daily.    [provider]  aspirin  81 MG chewable tablet Chew 81 mg by mouth in the morning.    [provider]  atorvastatin  (LIPITOR) 40 MG tablet Take 20 mg by mouth every evening.    [provider]  cetirizine (ZYRTEC) 10 MG tablet Take 10 mg by mouth in the morning.    [provider]   Cholecalciferol 25 MCG (1000 UT) capsule Take 1,000 Units by mouth 2 (two) times daily.    [provider]  clopidogrel  (PLAVIX ) 75 MG tablet Take 1 tablet by mouth once daily 02/05/23   Ladona Heinz, MD  Continuous Glucose Sensor (FREESTYLE LIBRE 3 PLUS SENSOR) MISC Apply 1 patch topically every 14 (fourteen) days. 01/11/23   [provider]  denosumab (PROLIA) 60 MG/ML SOSY injection Inject 60 mg into the skin every 6 (six) months. 06/18/22   [provider]  DULoxetine  (CYMBALTA ) 60 MG capsule Take 60 mg by mouth 2 (two) times daily.    [provider]  furosemide  (LASIX ) 20 MG tablet TAKE 1 TABLET BY MOUTH ONCE DAILY AS NEEDED FOR FLUID Patient taking differently: Take 20 mg by mouth daily. TAKE 1 TABLET BY MOUTH ONCE DAILY 01/02/23   Ladona Heinz, MD  gabapentin  (NEURONTIN ) 800 MG tablet Take 400 mg by mouth at bedtime. 08/15/21   [provider]  HUMALOG KWIKPEN 100 UNIT/ML KwikPen Inject 5-10 Units into the skin See admin instructions. Take 5-10 units subcutaneously before each meal and snack.    [provider]  insulin  glargine, 2 Unit Dial, (TOUJEO  MAX SOLOSTAR) 300 UNIT/ML Solostar Pen Inject 10 Units into the skin at bedtime. Patient taking differently: Inject 62 Units into the skin  at bedtime. 08/08/21   Sheikh, Omair Latif, DO  losartan  (COZAAR ) 25 MG tablet Take 1 tablet by mouth once daily 07/31/23   Ladona Heinz, MD  methocarbamol  (ROBAXIN ) 500 MG tablet Take 500 mg by mouth at bedtime as needed for muscle spasms.    [provider]  metoprolol  tartrate (LOPRESSOR ) 25 MG tablet Take 1 tablet (25 mg total) by mouth 2 (two) times daily. Patient taking differently: Take 12.5 mg by mouth 2 (two) times daily. 01/14/23   Ladona Heinz, MD  ondansetron  (ZOFRAN -ODT) 4 MG disintegrating tablet Take 1 tablet (4 mg total) by mouth every 8 (eight) hours as needed for nausea or vomiting. 11/23/22   Francesca Elsie CROME, MD  pantoprazole  (PROTONIX ) 40  MG tablet Take 1 tablet (40 mg total) by mouth 2 (two) times daily. Patient taking differently: Take 40 mg by mouth daily. 08/08/21   Sheikh, Omair Latif, DO  polyethylene glycol powder (GLYCOLAX/MIRALAX) 17 GM/SCOOP powder Take 1 Container by mouth daily as needed.    [provider]  RYBELSUS  3 MG TABS Take 1 tablet by mouth daily. 08/23/22   [provider]  traMADol  (ULTRAM ) 50 MG tablet Take 50 mg by mouth at bedtime as needed.    [provider]    Allergies: Hydrocodone     Review of Systems  Constitutional:  Negative for chills and fever.  HENT:  Negative for ear pain and sore throat.   Eyes:  Negative for pain and visual disturbance.  Respiratory:  Negative for cough and shortness of breath.   Cardiovascular:  Negative for chest pain and palpitations.  Gastrointestinal:  Negative for abdominal pain and vomiting.  Genitourinary:  Negative for dysuria and hematuria.  Musculoskeletal:  Negative for arthralgias and back pain.  Skin:  Negative for color change and rash.  Neurological:  Negative for seizures and syncope.  All other systems reviewed and are negative.   Updated Vital Signs BP (!) 144/104   Pulse 93   Temp 98 F (36.7 C)   Resp 18   Ht 5' 2 (1.575 m)   Wt 81.6 kg   LMP 10/06/2012   SpO2 97%   BMI 32.92 kg/m   Physical Exam Vitals and nursing note reviewed.  Constitutional:      General: She is not in acute distress.    Appearance: She is well-developed.  HENT:     Head: Normocephalic and atraumatic.  Eyes:     Conjunctiva/sclera: Conjunctivae normal.  Cardiovascular:     Rate and Rhythm: Normal rate and regular rhythm.     Heart sounds: No murmur heard. Pulmonary:     Effort: Pulmonary effort is normal. No respiratory distress.     Breath sounds: Normal breath sounds.  Abdominal:     Palpations: Abdomen is soft.     Tenderness: There is no abdominal tenderness.  Musculoskeletal:        General: No swelling.     Cervical  back: Neck supple.  Skin:    General: Skin is warm and dry.     Capillary Refill: Capillary refill takes less than 2 seconds.  Neurological:     Mental Status: She is alert.  Psychiatric:        Mood and Affect: Mood normal.     (all labs ordered are listed, but only abnormal results are displayed) Labs Reviewed - No data to display  EKG: None  Radiology: CT Head Wo Contrast Result Date: 08/09/2023 CLINICAL DATA:  Recent fall with headaches and  neck pain, initial encounter EXAM: CT HEAD WITHOUT CONTRAST CT CERVICAL SPINE WITHOUT CONTRAST TECHNIQUE: Multidetector CT imaging of the head and cervical spine was performed following the standard protocol without intravenous contrast. Multiplanar CT image reconstructions of the cervical spine were also generated. RADIATION DOSE REDUCTION: This exam was performed according to the departmental dose-optimization program which includes automated exposure control, adjustment of the mA and/or kV according to patient size and/or use of iterative reconstruction technique. COMPARISON:  08/02/2021 FINDINGS: CT HEAD FINDINGS Brain: No evidence of acute infarction, hemorrhage, hydrocephalus, extra-axial collection or mass lesion/mass effect. Vascular: No hyperdense vessel or unexpected calcification. Skull: Normal. Negative for fracture or focal lesion. Sinuses/Orbits: No acute finding. Other: None. CT CERVICAL SPINE FINDINGS Alignment: Within normal limits. Skull base and vertebrae: 7 cervical segments are well visualized. Vertebral body height is well maintained. Mild osteophytic changes and facet hypertrophic changes are seen. No acute fracture or acute facet abnormality is noted. The odontoid is within normal limits. Soft tissues and spinal canal: Surrounding soft tissue structures show no acute abnormality. No focal hematoma is seen. Upper chest: Visualized lung apices are within normal limits. Other: None IMPRESSION: CT of the head: No acute intracranial  abnormality noted. CT of cervical spine: Multilevel degenerative change without acute abnormality. Electronically Signed   By: Oneil Devonshire M.D.   On: 08/09/2023 19:52   CT Cervical Spine Wo Contrast Result Date: 08/09/2023 CLINICAL DATA:  Recent fall with headaches and neck pain, initial encounter EXAM: CT HEAD WITHOUT CONTRAST CT CERVICAL SPINE WITHOUT CONTRAST TECHNIQUE: Multidetector CT imaging of the head and cervical spine was performed following the standard protocol without intravenous contrast. Multiplanar CT image reconstructions of the cervical spine were also generated. RADIATION DOSE REDUCTION: This exam was performed according to the departmental dose-optimization program which includes automated exposure control, adjustment of the mA and/or kV according to patient size and/or use of iterative reconstruction technique. COMPARISON:  08/02/2021 FINDINGS: CT HEAD FINDINGS Brain: No evidence of acute infarction, hemorrhage, hydrocephalus, extra-axial collection or mass lesion/mass effect. Vascular: No hyperdense vessel or unexpected calcification. Skull: Normal. Negative for fracture or focal lesion. Sinuses/Orbits: No acute finding. Other: None. CT CERVICAL SPINE FINDINGS Alignment: Within normal limits. Skull base and vertebrae: 7 cervical segments are well visualized. Vertebral body height is well maintained. Mild osteophytic changes and facet hypertrophic changes are seen. No acute fracture or acute facet abnormality is noted. The odontoid is within normal limits. Soft tissues and spinal canal: Surrounding soft tissue structures show no acute abnormality. No focal hematoma is seen. Upper chest: Visualized lung apices are within normal limits. Other: None IMPRESSION: CT of the head: No acute intracranial abnormality noted. CT of cervical spine: Multilevel degenerative change without acute abnormality. Electronically Signed   By: Oneil Devonshire M.D.   On: 08/09/2023 19:52     Procedures    Medications Ordered in the ED - No data to display                                  Medical Decision Making Amount and/or Complexity of Data Reviewed Radiology: ordered.   Patient presents with fall.  Patient states he is on Plavix  in the setting of her known coronary artery disease.  Patient states that she tripped when try to walk into the house.  Did hit her head.  Did not lose conscious.  Endorses a mild headache.  Denies any kind of chest pain  before or after the event.  Patient states that she just tripped over her own feet.  No numbness or tingling.  She states she has chronic lower back pain but nothing new.  No bowel or bladder incontinence.  No saddle anesthesia.  Patient endorses some slight pain paraspinal on the left side of her cervical spine area when moving her head back-and-forth but otherwise denies any, new numbness or ting anywhere.  No midline cervical pain.  She states that otherwise she has been feeling fine without any kind of complaints.  Feels at her baseline.  No recent exertional chest pain or shortness of breath.  Patient states only reason she came to the ED today is because of her mechanical fall with head strike.    Previous medical history reviewed : Patient was last seen in February 2025.  Was seen because of fall.  Had a negative workup at that time.    Upon exam, patient hemodynamically stable.  ANO x 3 GCS 15.  No focal deficits.  Cranials 2 through 12 intact.  No midline tenderness of the cervical thoracic or lumbar spine.  When having the patient manipulate her cervical spine, she did endorse some paraspinal tenderness on the left side.  Otherwise, no new tenderness to the thoracic or lumbar area.  No saddle anesthesia.  No concern for any kind of thoracic or lumbar compression fracture or other pathology.  No indication for imaging of the thoracic or lumbar spine.   No concerns encounter cardiogenic syncope.  Patient states that she just tripped when  walking into the house.  No chest pain before or after today.  No shortness of breath before or after the event.  Denies all other complaints.  This seems to be definitively mechanical in nature.   CT head and CT cervical spine benign.  No evidence of any kind of intraparenchymal or subdural epidural bleed.  Recommended ongoing use of all medication.  Follow-up PCP.       Final diagnoses:  Fall, initial encounter  Contusion of head, unspecified part of head, initial encounter    ED Discharge Orders     None          Simon Lavonia SAILOR, MD 08/09/23 2003

## 2023-08-09 NOTE — Discharge Instructions (Signed)
 Your CT imaging was unremarkable.  There is no acute fractures in the cervical spine vertebrae.  There is no evidence of bleeding in her head.  Please continue with all your medication.

## 2023-08-09 NOTE — ED Triage Notes (Addendum)
 Arrives POV with sister after a mechanical fall at home walking into the house hitting head on ground.   Denies LOC or neck pain. Compliant on Clopidogrel .   Ambulatory.

## 2023-08-18 ENCOUNTER — Encounter: Payer: Self-pay | Admitting: Cardiology

## 2023-08-18 MED ORDER — ATORVASTATIN CALCIUM 20 MG PO TABS: 20.0000 mg | ORAL_TABLET | Freq: Every day | ORAL | 3 refills | Status: AC

## 2023-08-29 ENCOUNTER — Encounter (HOSPITAL_BASED_OUTPATIENT_CLINIC_OR_DEPARTMENT_OTHER): Payer: Self-pay

## 2023-08-29 ENCOUNTER — Emergency Department (HOSPITAL_BASED_OUTPATIENT_CLINIC_OR_DEPARTMENT_OTHER)
Admission: EM | Admit: 2023-08-29 | Discharge: 2023-08-29 | Disposition: A | Discharge status: Home / Self Care | Attending: Emergency Medicine | Admitting: Emergency Medicine

## 2023-08-29 ENCOUNTER — Other Ambulatory Visit: Payer: Self-pay

## 2023-08-29 DIAGNOSIS — Z7902 Long term (current) use of antithrombotics/antiplatelets: Secondary | ICD-10-CM | POA: Insufficient documentation

## 2023-08-29 DIAGNOSIS — Z203 Contact with and (suspected) exposure to rabies: Secondary | ICD-10-CM | POA: Diagnosis present

## 2023-08-29 DIAGNOSIS — Z23 Encounter for immunization: Secondary | ICD-10-CM | POA: Diagnosis not present

## 2023-08-29 DIAGNOSIS — Z2914 Encounter for prophylactic rabies immune globin: Secondary | ICD-10-CM | POA: Insufficient documentation

## 2023-08-29 DIAGNOSIS — Z7982 Long term (current) use of aspirin: Secondary | ICD-10-CM | POA: Diagnosis not present

## 2023-08-29 MED ORDER — RABIES VACCINE, PCEC IM SUSR
1.0000 mL | Freq: Once | INTRAMUSCULAR | Status: AC
Start: 2023-08-29 — End: 2023-08-29
  Administered 2023-08-29: 1 mL via INTRAMUSCULAR
  Filled 2023-08-29: qty 1

## 2023-08-29 MED ORDER — RABIES IMMUNE GLOBULIN 1500 UNIT/10ML IJ SOLN
20.0000 [IU]/kg | Freq: Once | INTRAMUSCULAR | Status: AC
Start: 2023-08-29 — End: 2023-08-29
  Administered 2023-08-29: 1500 [IU] via INTRAMUSCULAR
  Filled 2023-08-29: qty 10

## 2023-08-29 NOTE — ED Triage Notes (Signed)
 Pt reports hearing fluttering in room x3 days ago. Pt reports seeing a bat in their room/house. Pt denies any direct contact with bat. Pt reports bat is out of house.

## 2023-08-29 NOTE — Discharge Instructions (Addendum)
 You need to return to the ER 8/3, 8/8 and 8/15 for the rest of the rabies series.

## 2023-08-29 NOTE — ED Provider Notes (Signed)
 [place] EMERGENCY DEPARTMENT AT [facility] Provider Note   CSN: [id number] Arrival date & time: 08/29/23  1941     Patient presents with: Rabies Injection   Kayla Berger is a 63 y.o. female.   Patient is a 63 year old female presenting here today to get a rabies vaccine.  She reports that there has been a bat in her house since Tuesday.  They finally think they got the bat out of the house but they have been sleeping in the home with the bat.  They are not sure of any direct contact.  However they spoke with her doctor who sent them here to receive the vaccination.  She has no wounds that she is aware of.  No bites or scratches.  The history is provided by the patient.       Prior to Admission medications   Medication Sig Start Date End Date Taking? Authorizing Provider  Alpha Lipoic Acid 200 MG CAPS Take 200 mg by mouth at bedtime.    [provider]  ascorbic acid (VITAMIN C) 500 MG tablet Take 500 mg by mouth daily.    [provider]  aspirin  81 MG chewable tablet Chew 81 mg by mouth in the morning.    [provider]  atorvastatin  (LIPITOR) 20 MG tablet Take 1 tablet (20 mg total) by mouth daily. 08/18/23 11/16/23  Ladona Heinz, MD  cetirizine (ZYRTEC) 10 MG tablet Take 10 mg by mouth in the morning.    [provider]  Cholecalciferol 25 MCG (1000 UT) capsule Take 1,000 Units by mouth 2 (two) times daily.    [provider]  clopidogrel  (PLAVIX ) 75 MG tablet Take 1 tablet by mouth once daily 02/05/23   Ladona Heinz, MD  Continuous Glucose Sensor (FREESTYLE LIBRE 3 PLUS SENSOR) MISC Apply 1 patch topically every 14 (fourteen) days. 01/11/23   [provider]  denosumab (PROLIA) 60 MG/ML SOSY injection Inject 60 mg into the skin every 6 (six) months. 06/18/22   [provider]  DULoxetine  (CYMBALTA ) 60 MG capsule Take 60 mg by mouth 2 (two) times daily.    [provider]  furosemide  (LASIX ) 20 MG  tablet TAKE 1 TABLET BY MOUTH ONCE DAILY AS NEEDED FOR FLUID Patient taking differently: Take 20 mg by mouth daily. TAKE 1 TABLET BY MOUTH ONCE DAILY 01/02/23   Ladona Heinz, MD  gabapentin  (NEURONTIN ) 800 MG tablet Take 400 mg by mouth at bedtime. 08/15/21   [provider]  HUMALOG KWIKPEN 100 UNIT/ML KwikPen Inject 5-10 Units into the skin See admin instructions. Take 5-10 units subcutaneously before each meal and snack.    [provider]  insulin  glargine, 2 Unit Dial, (TOUJEO  MAX SOLOSTAR) 300 UNIT/ML Solostar Pen Inject 10 Units into the skin at bedtime. Patient taking differently: Inject 62 Units into the skin at bedtime. 08/08/21   Sheikh, Omair Latif, DO  losartan  (COZAAR ) 25 MG tablet Take 1 tablet by mouth once daily 07/31/23   Ganji, Jay, MD  methocarbamol  (ROBAXIN ) 500 MG tablet Take 500 mg by mouth at bedtime as needed for muscle spasms.    [provider]  metoprolol  tartrate (LOPRESSOR ) 25 MG tablet Take 1 tablet (25 mg total) by mouth 2 (two) times daily. Patient taking differently: Take 12.5 mg by mouth 2 (two) times daily. 01/14/23   Ladona Heinz, MD  ondansetron  (ZOFRAN -ODT) 4 MG disintegrating tablet Take 1 tablet (4 mg total) by mouth every 8 (eight) hours as needed for nausea  or vomiting. 11/23/22   Francesca Elsie CROME, MD  pantoprazole  (PROTONIX ) 40 MG tablet Take 1 tablet (40 mg total) by mouth 2 (two) times daily. Patient taking differently: Take 40 mg by mouth daily. 08/08/21   Sheikh, Omair Latif, DO  polyethylene glycol powder (GLYCOLAX/MIRALAX) 17 GM/SCOOP powder Take 1 Container by mouth daily as needed.    [provider]  RYBELSUS  3 MG TABS Take 1 tablet by mouth daily. 08/23/22   [provider]  traMADol  (ULTRAM ) 50 MG tablet Take 50 mg by mouth at bedtime as needed.    [provider]    Allergies: Hydrocodone     Review of Systems  Updated Vital Signs BP 123/74 (BP Location: Right Arm)   Pulse 84   Temp 98.2 F  (36.8 C) (Oral)   Resp 18   Ht 5' 2 (1.575 m)   Wt 81.6 kg   LMP 10/06/2012   SpO2 100%   BMI 32.90 kg/m   Physical Exam Vitals reviewed.  HENT:     Head: Normocephalic.  Cardiovascular:     Rate and Rhythm: Normal rate.  Pulmonary:     Effort: Pulmonary effort is normal.  Skin:    General: Skin is warm and dry.  Neurological:     Mental Status: She is alert. Mental status is at baseline.     (all labs ordered are listed, but only abnormal results are displayed) Labs Reviewed - No data to display  EKG: None  Radiology: No results found.   Procedures   Medications Ordered in the ED  Rabies Immune Globulin SOLN 1,500 Units (has no administration in time range)  rabies vaccine (RABAVERT) injection 1 mL (has no administration in time range)                                    Medical Decision Making Risk Prescription drug management.   Patient here for rabies vaccine after being in a house for the last several days with a bat.  The bat is now gone as far as they know but they did not capture the bat and it cannot be tested.  Patient given immunoglobulin and rabies vaccine and will return for the rest of the series.     Final diagnoses:  Rabies exposure    ED Discharge Orders     None          Doretha Folks, MD 08/29/23 2111

## 2023-09-01 ENCOUNTER — Ambulatory Visit (HOSPITAL_COMMUNITY)
Admission: EM | Admit: 2023-09-01 | Discharge: 2023-09-01 | Disposition: A | Discharge status: Home / Self Care | Attending: Family Medicine | Admitting: Family Medicine

## 2023-09-01 ENCOUNTER — Other Ambulatory Visit: Payer: Self-pay | Admitting: Cardiology

## 2023-09-01 ENCOUNTER — Encounter (HOSPITAL_COMMUNITY): Payer: Self-pay

## 2023-09-01 DIAGNOSIS — Z203 Contact with and (suspected) exposure to rabies: Secondary | ICD-10-CM | POA: Diagnosis not present

## 2023-09-01 DIAGNOSIS — Z23 Encounter for immunization: Secondary | ICD-10-CM | POA: Diagnosis not present

## 2023-09-01 MED ORDER — RABIES VACCINE, PCEC IM SUSR
1.0000 mL | Freq: Once | INTRAMUSCULAR | Status: AC
Start: 2023-09-01 — End: 2023-09-01
  Administered 2023-09-01: 1 mL via INTRAMUSCULAR

## 2023-09-01 MED ORDER — RABIES VACCINE, PCEC IM SUSR
INTRAMUSCULAR | Status: AC
  Filled 2023-09-01: qty 1

## 2023-09-01 NOTE — ED Triage Notes (Signed)
 Patient requests Rabavert  2nd dose.

## 2023-09-05 ENCOUNTER — Ambulatory Visit (HOSPITAL_COMMUNITY)
Admission: EM | Admit: 2023-09-05 | Discharge: 2023-09-05 | Disposition: A | Discharge status: Home / Self Care | Attending: Family Medicine | Admitting: Family Medicine

## 2023-09-05 ENCOUNTER — Encounter (HOSPITAL_COMMUNITY): Payer: Self-pay

## 2023-09-05 DIAGNOSIS — Z203 Contact with and (suspected) exposure to rabies: Secondary | ICD-10-CM | POA: Diagnosis not present

## 2023-09-05 DIAGNOSIS — Z23 Encounter for immunization: Secondary | ICD-10-CM

## 2023-09-05 MED ORDER — RABIES VACCINE, PCEC IM SUSR
INTRAMUSCULAR | Status: AC
  Filled 2023-09-05: qty 1

## 2023-09-05 MED ORDER — RABIES VACCINE, PCEC IM SUSR
1.0000 mL | Freq: Once | INTRAMUSCULAR | Status: AC
  Administered 2023-09-05: 1 mL via INTRAMUSCULAR

## 2023-09-05 NOTE — ED Triage Notes (Signed)
 Patient presents for her 3rd rabies injection given in [place]. Patient tolerated well.  NDC [phone]

## 2023-09-12 ENCOUNTER — Ambulatory Visit (HOSPITAL_COMMUNITY)
Admission: EM | Admit: 2023-09-12 | Discharge: 2023-09-12 | Disposition: A | Discharge status: Home / Self Care | Attending: Family Medicine | Admitting: Family Medicine

## 2023-09-12 ENCOUNTER — Other Ambulatory Visit: Payer: Self-pay

## 2023-09-12 VITALS — BP 107/75 | HR 85 | Resp 18

## 2023-09-12 DIAGNOSIS — Z23 Encounter for immunization: Secondary | ICD-10-CM

## 2023-09-12 MED ORDER — RABIES VACCINE, PCEC IM SUSR
1.0000 mL | Freq: Once | INTRAMUSCULAR | Status: AC
  Administered 2023-09-12: 1 mL via INTRAMUSCULAR

## 2023-09-12 MED ORDER — RABIES VACCINE, PCEC IM SUSR: 1.0000 mL | Freq: Once | INTRAMUSCULAR | Status: DC

## 2023-09-12 MED ORDER — RABIES VACCINE, PCEC IM SUSR
INTRAMUSCULAR | Status: AC
  Filled 2023-09-12: qty 1

## 2023-09-12 NOTE — ED Triage Notes (Signed)
 PT presents for 4th  rabies shot.

## 2023-09-15 ENCOUNTER — Other Ambulatory Visit: Payer: Self-pay

## 2023-09-15 ENCOUNTER — Ambulatory Visit: Attending: Neurosurgery | Admitting: Physical Therapy

## 2023-09-15 ENCOUNTER — Encounter: Payer: Self-pay | Admitting: Physical Therapy

## 2023-09-15 DIAGNOSIS — M62830 Muscle spasm of back: Secondary | ICD-10-CM | POA: Insufficient documentation

## 2023-09-15 DIAGNOSIS — M5459 Other low back pain: Secondary | ICD-10-CM | POA: Diagnosis present

## 2023-09-15 DIAGNOSIS — M5416 Radiculopathy, lumbar region: Secondary | ICD-10-CM | POA: Insufficient documentation

## 2023-09-15 DIAGNOSIS — R2681 Unsteadiness on feet: Secondary | ICD-10-CM | POA: Insufficient documentation

## 2023-09-15 DIAGNOSIS — M6281 Muscle weakness (generalized): Secondary | ICD-10-CM | POA: Diagnosis present

## 2023-09-15 NOTE — Therapy (Signed)
 OUTPATIENT PHYSICAL THERAPY THORACOLUMBAR EVALUATION   Patient Name: Kayla Berger MRN: [id number] DOB:03-01-1960, 63 y.o., female Today's Date: 09/15/2023  END OF SESSION:  PT End of Session - 09/15/23 0939     Visit Number 1    Number of Visits 12    Date for PT Re-Evaluation 10/27/23    PT Start Time 0851    PT Stop Time 0944    PT Time Calculation (min) 53 min    Activity Tolerance Patient tolerated treatment well    Behavior During Therapy WFL for tasks assessed/performed          Past Medical History:  Diagnosis Date   Arthritis    Complication of anesthesia    hard to awaken   Dysrhythmia    irregular heartbeat- rx   GERD (gastroesophageal reflux disease)    History of kidney stones    Hypertension    Hypothyroidism    synthroid  in past no med now   IDDM (insulin  dependent diabetes mellitus)    AODM -type II   Migraines    occ   PCOS (polycystic ovarian syndrome) 1986   PONV (postoperative nausea and vomiting)    Renal disorder    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/2022   Shoulder pain    Thyroid  disease    Trochanteric bursitis of left hip    Vaginal cysts    4 have been removed   Past Surgical History:  Procedure Laterality Date   APPENDECTOMY  1967   ARTERIAL BYPASS SURGRY  07/18/2022   CARDIAC CATHETERIZATION Bilateral    cataracts   CHOLECYSTECTOMY N/A 02/16/2021   Procedure: LAPAROSCOPIC CHOLECYSTECTOMY;  Surgeon: Dasie Leonor LITTIE, MD;  Location: [facility];  Service: General;  Laterality: N/A;   COLONOSCOPY WITH PROPOFOL  N/A 11/22/2022   Procedure: COLONOSCOPY WITH PROPOFOL ;  Surgeon: Rollin Dover, MD;  Location: [facility];  Service: Gastroenterology;  Laterality: N/A;   CORONARY PRESSURE/FFR STUDY N/A 04/05/2022   Procedure: INTRAVASCULAR PRESSURE WIRE/FFR STUDY;  Surgeon: Ladona Heinz, MD;  Location: [facility];  Service: Cardiovascular;  Laterality: N/A;   KYPHOPLASTY N/A 10/31/2016    Procedure: KYPHOPLASTY LUMBAR ONE, LUMBAR TWO AND  LUMBAR THREE;  Surgeon: Burnetta Aures, MD;  Location: [facility];  Service: Orthopedics;  Laterality: N/A;   LEFT HEART CATH AND CORONARY ANGIOGRAPHY N/A 04/05/2022   Procedure: LEFT HEART CATH AND CORONARY ANGIOGRAPHY;  Surgeon: Ladona Heinz, MD;  [facility]: [facility];  Service: Cardiovascular;  Laterality: N/A;   nsvd  2001   normal vaginal del   POLYPECTOMY  11/22/2022   Procedure: POLYPECTOMY;  Surgeon: Rollin Dover, MD;  Location: [facility];  Service: Gastroenterology;;   marline, laporoscopy  1997   fertility test   SUBMUCOSAL TATTOO INJECTION  11/22/2022   Procedure: SUBMUCOSAL TATTOO INJECTION;  Surgeon: Rollin Dover, MD;  Location: [facility];  Service: Gastroenterology;;   Patient Active Problem List   Diagnosis Date Noted   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/2022   LAD stenosis 04/05/2022   Coronary artery disease 04/05/2022   Malnutrition of moderate degree 08/07/2021   Acute kidney failure (HCC) 08/02/2021   Hyperkalemia, diminished renal excretion 08/02/2021   Lactic acidosis 08/02/2021   Closed compression fracture of L2 vertebra (HCC) 10/31/2016   Hyperlipidemia 09/23/2012   Hypothyroid 09/23/2012   Allergic rhinitis 09/23/2012   History of  migraine headaches 09/23/2012   DM2 (diabetes mellitus, type 2) (HCC) 06/26/2012   HTN (hypertension) 06/26/2012   REFERRING PROVIDER: Arley Helling MD.  REFERRING DIAG: Spondylosis without myelopathy or radiculopathy, lumbar region.    Rationale for Evaluation and Treatment: Rehabilitation  THERAPY DIAG:  Other low back pain  Radiculopathy, lumbar region  Muscle spasm of back  ONSET DATE: Ongoing but flare-up 6 weeks ago.  SUBJECTIVE:                                                                                                                                                                                            SUBJECTIVE STATEMENT: The patient presents to the clinic with chronic lumbar pain but a recent flare-up about 6 weeks ago after a fall.  She reports low back pain and shooting pain down the length of both LE's.  Her pain is rated at a 7-8/10 and described as an ache, sore, sharp and shooting.  Sometimes stretching decreases her pain and pretty much everything increases her pain.  She reports she cannot stand for walk for long.  Her sleep is disturbed by pain.   PERTINENT HISTORY:  DM, O (per patient), Kyphoplasty, CABG.  PAIN:  Are you having pain? Yes: NPRS scale: 7-8/10. Pain location: Low back.   Pain description: As above.   Aggravating factors: As above.   Relieving factors: As above.    PRECAUTIONS: Other: OP.  Avoid spinal loading.  Falls.  RED FLAGS: None   WEIGHT BEARING RESTRICTIONS: No  FALLS:  Has patient fallen in last 6 months? Yes. Number of falls 2.  Recommended can use at all times,  She has a cane.  When shopping she states she uses a cart.    LIVING ENVIRONMENT: Lives in: House/apartment Has following equipment at home: None.  Did not bring cane today.    OCCUPATION: Works from home.  PLOF: Independent  PATIENT GOALS: Do more with less pain.      OBJECTIVE:   DIAGNOSTIC FINDINGS:   03/06/23:     FINDINGS: There is transitional lumbosacral anatomy with inconsistent numbering used on previous imaging reports. There are 12 full sized pairs of ribs and 6 lumbar type vertebrae, the lowest of which will be considered a lumbarized S1. This numbering is consistent with that used on the 09/26/2022 MRI.   There is mild lower lumbar dextroscoliosis. Trace retrolisthesis of L4 on L5 is unchanged. Chronic compression fractures are again seen at T12, L2, L3, L4, and L5, unchanged from 11/23/2022 with previous augmentation of the L2-L4 fractures. No acute fracture is identified. Moderate facet arthrosis is noted in the mid  to lower lumbar spine. There is  mild disc space narrowing at L3-4. Aortic atherosclerosis and right upper quadrant abdominal surgical clips are noted.   IMPRESSION: 1. Transitional lumbosacral anatomy as above. 2. Multiple chronic compression fractures.  No acute fracture.  PATIENT SURVEYS:  ODI:  24/50.   POSTURE: rounded shoulders and forward head  PALPATION: Tender diffusely over lumbar musculature and very TTP over her left SIJ.     LOWER EXTREMITY ROM:     WNL.  LOWER EXTREMITY MMT:   WNL.  LUMBAR SPECIAL TESTS:  Equal leg lengths.  No significant pain with SLR testing.  Pain with left FABER test.    GAIT: Antalgic gait with decreased stance time over left LE.    FUNCTIONAL TESTS:  5 time sit to stand:  18 seconds.  TUG is 15 seconds.    TREATMENT DATE: 09/15/23:   HMP and IFC at 80-150 Hz on 40% scan x 20 minutes to patient's bilateral lumbar region.  Normal modality response following removal of modality.                                                                                                                              PATIENT EDUCATION:  Education details: Discussed using a cane fro safety.  Discussed sleeping posture with pillows.   Person educated: Patient.   Education method: Demo.   Education comprehension: Good.    HOME EXERCISE PROGRAM:   ASSESSMENT:  CLINICAL IMPRESSION: The patient presents to [facility] with c/o chronic low back pain and a recent flare-up about 6 weeks ago after.  We discussed the importance of using a cane.  She is tender diffusely over lumbar musculature and very TTP over her left SIJ.  Her TUG is 15 seconds and 5 time sit to stand is 18 seconds.  Her gait is antalgic in nature with decreased stance time over her left LE.  Her ODI score is 24/50.  She demonstrates a positive left FABER test.   Patient will benefit from skilled PT intervention to address pain and deficits.   OBJECTIVE IMPAIRMENTS: Abnormal gait, decreased activity tolerance, decreased  mobility, increased muscle spasms, postural dysfunction, and pain.   ACTIVITY LIMITATIONS: carrying, lifting, bending, standing, sleeping, and locomotion level  PARTICIPATION LIMITATIONS: meal prep, cleaning, laundry, shopping, community activity, and yard work  PERSONAL FACTORS: Time since onset of injury/illness/exacerbation and 1-2 comorbidities: OP, kyphoplasty are also affecting patient's functional outcome.   REHAB POTENTIAL: Good  CLINICAL DECISION MAKING: Evolving/moderate complexity  EVALUATION COMPLEXITY: Moderate   GOALS:  SHORT TERM GOALS: Target date: 09/29/23.  Ind with an initial HEP.  Goal status: INITIAL  LONG TERM GOALS: Target date: 10/27/23  Ind with an advanced HEP.  Goal status: INITIAL  2.  Sleep 6 hours undisturbed.  Goal status: INITIAL  3.  Perform ADL's with pain not > 3-4/10.  Goal status: INITIAL  4.  Stand 20 minutes with pain not > 3-4/10.  Goal  status: INITIAL  5.  Walk a community distance with pain not > 3-4/10.  Goal status: INITIAL  PLAN:  PT FREQUENCY: 2x/week  PT DURATION: 6 weeks  PLANNED INTERVENTIONS: 97110-Therapeutic exercises, 97530- Therapeutic activity, [id number]- Neuromuscular re-education, 97535- Self Care, 02859- Manual therapy, [id number]- Electrical stimulation (unattended), 97035- Ultrasound, 79439 (1-2 muscles), 20561 (3+ muscles)- Dry Needling, Patient/Family education, and Moist heat.  PLAN FOR NEXT SESSION: S and DKTC, hip bridges, STW/M and modalities as needed.  Core exercise progression.  Spinal protection technique techniques.  Body mechanics training.   Jalyssa Fleisher, [place], PT 09/15/2023, 11:47 AM

## 2023-09-22 ENCOUNTER — Encounter: Payer: Self-pay | Admitting: *Deleted

## 2023-09-22 ENCOUNTER — Ambulatory Visit: Admitting: *Deleted

## 2023-09-22 DIAGNOSIS — M5459 Other low back pain: Secondary | ICD-10-CM | POA: Diagnosis not present

## 2023-09-22 DIAGNOSIS — M62830 Muscle spasm of back: Secondary | ICD-10-CM

## 2023-09-22 DIAGNOSIS — M5416 Radiculopathy, lumbar region: Secondary | ICD-10-CM

## 2023-09-22 NOTE — Therapy (Signed)
 OUTPATIENT PHYSICAL THERAPY THORACOLUMBAR TREATMENT   Patient Name: Kayla Berger MRN: [id number] DOB:1960-07-19, 63 y.o., female Today's Date: 09/22/2023  END OF SESSION:  PT End of Session - 09/22/23 0803     Visit Number 2    Number of Visits 12    Date for PT Re-Evaluation 10/27/23    PT Start Time 0803          Past Medical History:  Diagnosis Date   Arthritis    Complication of anesthesia    hard to awaken   Dysrhythmia    irregular heartbeat- rx   GERD (gastroesophageal reflux disease)    History of kidney stones    Hypertension    Hypothyroidism    synthroid  in past no med now   IDDM (insulin  dependent diabetes mellitus)    AODM -type II   Migraines    occ   PCOS (polycystic ovarian syndrome) 1986   PONV (postoperative nausea and vomiting)    Renal disorder    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/2022   Shoulder pain    Thyroid  disease    Trochanteric bursitis of left hip    Vaginal cysts    4 have been removed   Past Surgical History:  Procedure Laterality Date   APPENDECTOMY  1967   ARTERIAL BYPASS SURGRY  07/18/2022   CARDIAC CATHETERIZATION Bilateral    cataracts   CHOLECYSTECTOMY N/A 02/16/2021   Procedure: LAPAROSCOPIC CHOLECYSTECTOMY;  Surgeon: Dasie Leonor LITTIE, MD;  Location: [facility];  Service: General;  Laterality: N/A;   COLONOSCOPY WITH PROPOFOL  N/A 11/22/2022   Procedure: COLONOSCOPY WITH PROPOFOL ;  Surgeon: Rollin Dover, MD;  Location: [facility];  Service: Gastroenterology;  Laterality: N/A;   CORONARY PRESSURE/FFR STUDY N/A 04/05/2022   Procedure: INTRAVASCULAR PRESSURE WIRE/FFR STUDY;  Surgeon: Ladona Heinz, MD;  [facility];  Service: Cardiovascular;  Laterality: N/A;   KYPHOPLASTY N/A 10/31/2016   Procedure: KYPHOPLASTY LUMBAR ONE, LUMBAR TWO AND  LUMBAR THREE;  Surgeon: Burnetta Aures, MD;  Location: [facility];  Service: Orthopedics;  Laterality: N/A;   LEFT HEART CATH AND  CORONARY ANGIOGRAPHY N/A 04/05/2022   Procedure: LEFT HEART CATH AND CORONARY ANGIOGRAPHY;  Surgeon: Ladona Heinz, MD;  [facility];  Service: Cardiovascular;  Laterality: N/A;   nsvd  2001   normal vaginal del   POLYPECTOMY  11/22/2022   Procedure: POLYPECTOMY;  Surgeon: Rollin Dover, MD;  Location: [facility];  Service: Gastroenterology;;   marline, laporoscopy  1997   fertility test   SUBMUCOSAL TATTOO INJECTION  11/22/2022   Procedure: SUBMUCOSAL TATTOO INJECTION;  Surgeon: Rollin Dover, MD;  Location: [facility];  Service: Gastroenterology;;   Patient Active Problem List   Diagnosis Date Noted   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/2022   LAD stenosis 04/05/2022   Coronary artery disease 04/05/2022   Malnutrition of moderate degree 08/07/2021   Acute kidney failure (HCC) 08/02/2021   Hyperkalemia, diminished renal excretion 08/02/2021   Lactic acidosis 08/02/2021   Closed compression fracture of L2 vertebra (HCC) 10/31/2016   Hyperlipidemia 09/23/2012   Hypothyroid 09/23/2012   Allergic rhinitis 09/23/2012   History of migraine headaches 09/23/2012   DM2 (diabetes mellitus, type 2) (HCC) 06/26/2012   HTN (hypertension) 06/26/2012   REFERRING PROVIDER: Arley Helling MD.  REFERRING DIAG: Spondylosis without myelopathy or radiculopathy, lumbar region.  Rationale for Evaluation and Treatment: Rehabilitation  THERAPY DIAG:  Other low back pain  Radiculopathy, lumbar region  Muscle spasm of back  ONSET DATE: Ongoing but flare-up 6 weeks ago.  SUBJECTIVE:                                                                                                                                                                                           SUBJECTIVE STATEMENT: The patient reports having MRI and f/u with Thursday   PERTINENT HISTORY:  DM, O (per patient), Kyphoplasty, CABG.  PAIN:  Are you having pain?  Yes: NPRS scale: 7-8/10. Pain location: Low back.   Pain description: As above.   Aggravating factors: As above.   Relieving factors: As above.    PRECAUTIONS: Other: OP.  Avoid spinal loading.  Falls.  RED FLAGS: None   WEIGHT BEARING RESTRICTIONS: No  FALLS:  Has patient fallen in last 6 months? Yes. Number of falls 2.  Recommended can use at all times,  She has a cane.  When shopping she states she uses a cart.    LIVING ENVIRONMENT: Lives in: House/apartment Has following equipment at home: None.  Did not bring cane today.    OCCUPATION: Works from home.  PLOF: Independent  PATIENT GOALS: Do more with less pain.      OBJECTIVE:   DIAGNOSTIC FINDINGS:   03/06/23:     FINDINGS: There is transitional lumbosacral anatomy with inconsistent numbering used on previous imaging reports. There are 12 full sized pairs of ribs and 6 lumbar type vertebrae, the lowest of which will be considered a lumbarized S1. This numbering is consistent with that used on the 09/26/2022 MRI.   There is mild lower lumbar dextroscoliosis. Trace retrolisthesis of L4 on L5 is unchanged. Chronic compression fractures are again seen at T12, L2, L3, L4, and L5, unchanged from 11/23/2022 with previous augmentation of the L2-L4 fractures. No acute fracture is identified. Moderate facet arthrosis is noted in the mid to lower lumbar spine. There is mild disc space narrowing at L3-4. Aortic atherosclerosis and right upper quadrant abdominal surgical clips are noted.   IMPRESSION: 1. Transitional lumbosacral anatomy as above. 2. Multiple chronic compression fractures.  No acute fracture.  PATIENT SURVEYS:  ODI:  24/50.   POSTURE: rounded shoulders and forward head  PALPATION: Tender diffusely over lumbar musculature and very TTP over her left SIJ.     LOWER EXTREMITY ROM:     WNL.  LOWER EXTREMITY MMT:   WNL.  LUMBAR SPECIAL TESTS:  Equal leg lengths.  No significant pain with SLR  testing.  Pain with left FABER test.  GAIT: Antalgic gait with decreased stance time over left LE.    FUNCTIONAL TESTS:  5 time sit to stand:  18 seconds.  TUG is 15 seconds.    TREATMENT DATE: 09/22/23:   Discussed movement patterns with ADL's to decrease pain triggers Gait practiced in clinic with wide base SPC and did well. Discussed walking program                                   EXERCISE LOG  Exercise Repetitions and Resistance Comments  Nustep L3 x 10 mins   Sit to stand 2x5 from chair with AB bracing   AB bracing X 5             Blank cell = exercise not performed today  HMP and IFC at 80-150 Hz on 40% scan x 10 minutes to patient's bilateral lumbar region.  Normal modality response following removal of modality.                                                                                                                              PATIENT EDUCATION:  Education details: Discussed using a cane fro safety.  Discussed sleeping posture with pillows.   Person educated: Patient.   Education method: Demo.   Education comprehension: Good.    HOME EXERCISE PROGRAM:   ASSESSMENT:  CLINICAL IMPRESSION: The patient presents to [facility] with c/o chronic low back pain and a recent flare-up about 6 weeks ago. Rx focused on gait with SPC, movement patterns to decrease pain triggers with ADL's As well as sit to stand( mini squats at chair for HEP) and walking program.   OBJECTIVE IMPAIRMENTS: Abnormal gait, decreased activity tolerance, decreased mobility, increased muscle spasms, postural dysfunction, and pain.   ACTIVITY LIMITATIONS: carrying, lifting, bending, standing, sleeping, and locomotion level  PARTICIPATION LIMITATIONS: meal prep, cleaning, laundry, shopping, community activity, and yard work  PERSONAL FACTORS: Time since onset of injury/illness/exacerbation and 1-2 comorbidities: OP, kyphoplasty are also affecting patient's functional outcome.   REHAB POTENTIAL:  Good  CLINICAL DECISION MAKING: Evolving/moderate complexity  EVALUATION COMPLEXITY: Moderate   GOALS:  SHORT TERM GOALS: Target date: 09/29/23.  Ind with an initial HEP.  Goal status: INITIAL  LONG TERM GOALS: Target date: 10/27/23  Ind with an advanced HEP.  Goal status: INITIAL  2.  Sleep 6 hours undisturbed.  Goal status: INITIAL  3.  Perform ADL's with pain not > 3-4/10.  Goal status: INITIAL  4.  Stand 20 minutes with pain not > 3-4/10.  Goal status: INITIAL  5.  Walk a community distance with pain not > 3-4/10.  Goal status: INITIAL  PLAN:  PT FREQUENCY: 2x/week  PT DURATION: 6 weeks  PLANNED INTERVENTIONS: 97110-Therapeutic exercises, 97530- Therapeutic activity, [id number]- Neuromuscular re-education, 97535- Self Care, 02859- Manual therapy, [id number]- Electrical stimulation (unattended), 97035- Ultrasound, 79439 (1-2 muscles), 20561 (3+ muscles)- Dry Needling,  Patient/Family education, and Moist heat.  PLAN FOR NEXT SESSION: S and DKTC, hip bridges, STW/M and modalities as needed.  Core exercise progression.  Spinal protection technique techniques.  Body mechanics training.   Devora Tortorella,[place], PTA 09/22/2023, 8:04 AM

## 2023-09-26 ENCOUNTER — Ambulatory Visit: Admitting: *Deleted

## 2023-09-26 ENCOUNTER — Encounter: Payer: Self-pay | Admitting: *Deleted

## 2023-09-26 DIAGNOSIS — M5416 Radiculopathy, lumbar region: Secondary | ICD-10-CM

## 2023-09-26 DIAGNOSIS — M6281 Muscle weakness (generalized): Secondary | ICD-10-CM

## 2023-09-26 DIAGNOSIS — M5459 Other low back pain: Secondary | ICD-10-CM

## 2023-09-26 DIAGNOSIS — M62830 Muscle spasm of back: Secondary | ICD-10-CM

## 2023-09-26 DIAGNOSIS — R2681 Unsteadiness on feet: Secondary | ICD-10-CM

## 2023-09-26 NOTE — Therapy (Signed)
 OUTPATIENT PHYSICAL THERAPY THORACOLUMBAR TREATMENT   Patient Name: Kayla Berger MRN: [id number] DOB:23-Oct-1960, 63 y.o., female Today's Date: 09/26/2023  END OF SESSION:  PT End of Session - 09/26/23 0809     Visit Number 3    Number of Visits 12    Date for PT Re-Evaluation 10/27/23    PT Start Time 0800    PT Stop Time 0850    PT Time Calculation (min) 50 min          Past Medical History:  Diagnosis Date   Arthritis    Complication of anesthesia    hard to awaken   Dysrhythmia    irregular heartbeat- rx   GERD (gastroesophageal reflux disease)    History of kidney stones    Hypertension    Hypothyroidism    synthroid  in past no med now   IDDM (insulin  dependent diabetes mellitus)    AODM -type II   Migraines    occ   PCOS (polycystic ovarian syndrome) 1986   PONV (postoperative nausea and vomiting)    Renal disorder    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/2022   Shoulder pain    Thyroid  disease    Trochanteric bursitis of left hip    Vaginal cysts    4 have been removed   Past Surgical History:  Procedure Laterality Date   APPENDECTOMY  1967   ARTERIAL BYPASS SURGRY  07/18/2022   CARDIAC CATHETERIZATION Bilateral    cataracts   CHOLECYSTECTOMY N/A 02/16/2021   Procedure: LAPAROSCOPIC CHOLECYSTECTOMY;  Surgeon: Dasie Leonor LITTIE, MD;  Location: [facility];  Service: General;  Laterality: N/A;   COLONOSCOPY WITH PROPOFOL  N/A 11/22/2022   Procedure: COLONOSCOPY WITH PROPOFOL ;  Surgeon: Rollin Dover, MD;  Location: [facility];  Service: Gastroenterology;  Laterality: N/A;   CORONARY PRESSURE/FFR STUDY N/A 04/05/2022   Procedure: INTRAVASCULAR PRESSURE WIRE/FFR STUDY;  Surgeon: Ladona Heinz, MD;  Location: [facility];  Service: Cardiovascular;  Laterality: N/A;   KYPHOPLASTY N/A 10/31/2016   Procedure: KYPHOPLASTY LUMBAR ONE, LUMBAR TWO AND  LUMBAR THREE;  Surgeon: Burnetta Aures, MD;  Location: [facility];  Service:  Orthopedics;  Laterality: N/A;   LEFT HEART CATH AND CORONARY ANGIOGRAPHY N/A 04/05/2022   Procedure: LEFT HEART CATH AND CORONARY ANGIOGRAPHY;  Surgeon: Ladona Heinz, MD;  Location: [facility];  Service: Cardiovascular;  Laterality: N/A;   nsvd  2001   normal vaginal del   POLYPECTOMY  11/22/2022   Procedure: POLYPECTOMY;  Surgeon: Rollin Dover, MD;  Location: [facility];  Service: Gastroenterology;;   marline, laporoscopy  1997   fertility test   SUBMUCOSAL TATTOO INJECTION  11/22/2022   Procedure: SUBMUCOSAL TATTOO INJECTION;  Surgeon: Rollin Dover, MD;  Location: [facility];  Service: Gastroenterology;;   Patient Active Problem List   Diagnosis Date Noted   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/2022   LAD stenosis 04/05/2022   Coronary artery disease 04/05/2022   Malnutrition of moderate degree 08/07/2021   Acute kidney failure (HCC) 08/02/2021   Hyperkalemia, diminished renal excretion 08/02/2021   Lactic acidosis 08/02/2021   Closed compression fracture of L2 vertebra (HCC) 10/31/2016   Hyperlipidemia 09/23/2012   Hypothyroid 09/23/2012   Allergic rhinitis 09/23/2012   History of migraine headaches 09/23/2012   DM2 (diabetes mellitus, type 2) (HCC) 06/26/2012   HTN (hypertension) 06/26/2012  REFERRING PROVIDER: Arley Helling MD.  REFERRING DIAG: Spondylosis without myelopathy or radiculopathy, lumbar region.    Rationale for Evaluation and Treatment: Rehabilitation  THERAPY DIAG:  Other low back pain  Radiculopathy, lumbar region  Muscle spasm of back  Muscle weakness (generalized)  Unsteadiness on feet  ONSET DATE: Ongoing but flare-up 6 weeks ago.  SUBJECTIVE:                                                                                                                                                                                           SUBJECTIVE STATEMENT: The patient reports having MRI f/u with  MD. Reports not much change. A lot of pain after last Rx   PERTINENT HISTORY:  DM, O (per patient), Kyphoplasty, CABG.  PAIN:  Are you having pain? Yes: NPRS scale: 7-8/10. Pain location: Low back.   Pain description: As above.   Aggravating factors: As above.   Relieving factors: As above.    PRECAUTIONS: Other: OP.  Avoid spinal loading.  Falls.  RED FLAGS: None   WEIGHT BEARING RESTRICTIONS: No  FALLS:  Has patient fallen in last 6 months? Yes. Number of falls 2.  Recommended can use at all times,  She has a cane.  When shopping she states she uses a cart.    LIVING ENVIRONMENT: Lives in: House/apartment Has following equipment at home: None.  Did not bring cane today.    OCCUPATION: Works from home.  PLOF: Independent  PATIENT GOALS: Do more with less pain.      OBJECTIVE:   DIAGNOSTIC FINDINGS:   03/06/23:     FINDINGS: There is transitional lumbosacral anatomy with inconsistent numbering used on previous imaging reports. There are 12 full sized pairs of ribs and 6 lumbar type vertebrae, the lowest of which will be considered a lumbarized S1. This numbering is consistent with that used on the 09/26/2022 MRI.   There is mild lower lumbar dextroscoliosis. Trace retrolisthesis of L4 on L5 is unchanged. Chronic compression fractures are again seen at T12, L2, L3, L4, and L5, unchanged from 11/23/2022 with previous augmentation of the L2-L4 fractures. No acute fracture is identified. Moderate facet arthrosis is noted in the mid to lower lumbar spine. There is mild disc space narrowing at L3-4. Aortic atherosclerosis and right upper quadrant abdominal surgical clips are noted.   IMPRESSION: 1. Transitional lumbosacral anatomy as above. 2. Multiple chronic compression fractures.  No acute fracture.  PATIENT SURVEYS:  ODI:  24/50.   POSTURE: rounded shoulders and forward head  PALPATION: Tender diffusely over lumbar musculature and very TTP over her left  SIJ.  LOWER EXTREMITY ROM:     WNL.  LOWER EXTREMITY MMT:   WNL.  LUMBAR SPECIAL TESTS:  Equal leg lengths.  No significant pain with SLR testing.  Pain with left FABER test.    GAIT: Antalgic gait with decreased stance time over left LE.    FUNCTIONAL TESTS:  5 time sit to stand:  18 seconds.  TUG is 15 seconds.    TREATMENT DATE: 09/26/23:   Discussed and reviewed  movement patterns with ADL's again  to decrease pain triggers                                    EXERCISE LOG  Exercise Repetitions and Resistance Comments  Nustep L4 x 5 mins   Sit to stand    AB bracing             Blank cell = exercise not performed today  US  combo x 14 mins (7 mins each side LB) seated 1.5 w/cm2  HMP and IFC at 80-150 Hz on 40% scan x 15 minutes to patient's bilateral lumbar region seated.  Normal modality response following removal of modality.                                                                                                                              PATIENT EDUCATION:  Education details: Discussed using a cane fro safety.  Discussed sleeping posture with pillows.   Person educated: Patient.   Education method: Demo.   Education comprehension: Good.    HOME EXERCISE PROGRAM:   ASSESSMENT:  CLINICAL IMPRESSION: The patient reports MD f/u about MRI went okay , but uninformative about sciatica pain. Not much change since last MRI. Pt able to perform only 5 mins on Nustep today due to pain. US  combo and discussion/ review of movement patterns to decrease pain triggers. IFC end of session. Pt reports decreased pain end of session.     OBJECTIVE IMPAIRMENTS: Abnormal gait, decreased activity tolerance, decreased mobility, increased muscle spasms, postural dysfunction, and pain.   ACTIVITY LIMITATIONS: carrying, lifting, bending, standing, sleeping, and locomotion level  PARTICIPATION LIMITATIONS: meal prep, cleaning, laundry, shopping, community activity, and yard  work  PERSONAL FACTORS: Time since onset of injury/illness/exacerbation and 1-2 comorbidities: OP, kyphoplasty are also affecting patient's functional outcome.   REHAB POTENTIAL: Good  CLINICAL DECISION MAKING: Evolving/moderate complexity  EVALUATION COMPLEXITY: Moderate   GOALS:  SHORT TERM GOALS: Target date: 09/29/23.  Ind with an initial HEP.  Goal status: INITIAL  LONG TERM GOALS: Target date: 10/27/23  Ind with an advanced HEP.  Goal status: INITIAL  2.  Sleep 6 hours undisturbed.  Goal status: INITIAL  3.  Perform ADL's with pain not > 3-4/10.  Goal status: INITIAL  4.  Stand 20 minutes with pain not > 3-4/10.  Goal status: INITIAL  5.  Walk a community distance with  pain not > 3-4/10.  Goal status: INITIAL  PLAN:  PT FREQUENCY: 2x/week  PT DURATION: 6 weeks  PLANNED INTERVENTIONS: 97110-Therapeutic exercises, 97530- Therapeutic activity, [id number]- Neuromuscular re-education, 97535- Self Care, [id number]- Manual therapy, [id number]- Electrical stimulation (unattended), 97035- Ultrasound, [id number] (1-2 muscles), [id number] (3+ muscles)- Dry Needling, Patient/Family education, and Moist heat.  PLAN FOR NEXT SESSION: S and DKTC, hip bridges, STW/M and modalities as needed.  Core exercise progression.  Spinal protection technique techniques.  Body mechanics training.   Rawlin Reaume,CHRIS, PTA 09/26/2023, 10:23 AM

## 2023-09-30 ENCOUNTER — Encounter: Admitting: *Deleted

## 2024-01-07 ENCOUNTER — Other Ambulatory Visit: Payer: Self-pay | Admitting: Cardiology

## 2024-01-07 DIAGNOSIS — R000 Tachycardia, unspecified: Secondary | ICD-10-CM

## 2024-01-07 DIAGNOSIS — I25118 Atherosclerotic heart disease of native coronary artery with other forms of angina pectoris: Secondary | ICD-10-CM

## 2024-01-23 ENCOUNTER — Other Ambulatory Visit: Payer: Self-pay | Admitting: Cardiology

## 2024-01-23 DIAGNOSIS — I2510 Atherosclerotic heart disease of native coronary artery without angina pectoris: Secondary | ICD-10-CM

## 2024-01-23 DIAGNOSIS — Z951 Presence of aortocoronary bypass graft: Secondary | ICD-10-CM

## 2024-01-28 ENCOUNTER — Other Ambulatory Visit: Payer: Self-pay | Admitting: Cardiology

## 2024-01-28 DIAGNOSIS — I2510 Atherosclerotic heart disease of native coronary artery without angina pectoris: Secondary | ICD-10-CM

## 2024-01-28 DIAGNOSIS — Z951 Presence of aortocoronary bypass graft: Secondary | ICD-10-CM

## 2024-01-28 DIAGNOSIS — E8779 Other fluid overload: Secondary | ICD-10-CM

## 2024-02-25 ENCOUNTER — Other Ambulatory Visit: Payer: Self-pay | Admitting: Cardiology

## 2024-02-25 DIAGNOSIS — E8779 Other fluid overload: Secondary | ICD-10-CM

## 2024-03-12 ENCOUNTER — Ambulatory Visit: Admitting: Cardiology
